# Patient Record
Sex: Female | Born: 1980
Health system: Southern US, Community
[De-identification: ages and names within clinical notes are randomized; demographics above are authoritative.]

## PROBLEM LIST (undated history)

## (undated) ENCOUNTER — Inpatient Hospital Stay (HOSPITAL_COMMUNITY): Payer: Self-pay

## (undated) DIAGNOSIS — B009 Herpesviral infection, unspecified: Secondary | ICD-10-CM

## (undated) DIAGNOSIS — I10 Essential (primary) hypertension: Secondary | ICD-10-CM

## (undated) DIAGNOSIS — J9600 Acute respiratory failure, unspecified whether with hypoxia or hypercapnia: Secondary | ICD-10-CM

## (undated) DIAGNOSIS — E119 Type 2 diabetes mellitus without complications: Secondary | ICD-10-CM

## (undated) DIAGNOSIS — E1110 Type 2 diabetes mellitus with ketoacidosis without coma: Secondary | ICD-10-CM

## (undated) DIAGNOSIS — E785 Hyperlipidemia, unspecified: Secondary | ICD-10-CM

---

## 2005-11-16 ENCOUNTER — Ambulatory Visit: Payer: Self-pay | Admitting: Internal Medicine

## 2005-11-28 ENCOUNTER — Ambulatory Visit: Payer: Self-pay | Admitting: *Deleted

## 2006-01-02 ENCOUNTER — Ambulatory Visit: Payer: Self-pay | Admitting: Internal Medicine

## 2006-01-02 ENCOUNTER — Encounter: Payer: Self-pay | Admitting: Internal Medicine

## 2006-02-06 ENCOUNTER — Ambulatory Visit: Payer: Self-pay | Admitting: Internal Medicine

## 2006-02-21 ENCOUNTER — Ambulatory Visit: Payer: Self-pay | Admitting: Internal Medicine

## 2006-03-07 ENCOUNTER — Ambulatory Visit: Payer: Self-pay | Admitting: Internal Medicine

## 2010-01-26 ENCOUNTER — Inpatient Hospital Stay (HOSPITAL_COMMUNITY): Admission: EM | Admit: 2010-01-26 | Discharge: 2010-01-29 | Payer: Self-pay | Admitting: Emergency Medicine

## 2010-02-08 ENCOUNTER — Emergency Department (HOSPITAL_COMMUNITY): Admission: EM | Admit: 2010-02-08 | Discharge: 2010-02-08 | Payer: Self-pay | Admitting: Emergency Medicine

## 2010-02-19 ENCOUNTER — Ambulatory Visit: Payer: Self-pay | Admitting: Internal Medicine

## 2010-04-21 ENCOUNTER — Ambulatory Visit: Payer: Self-pay | Admitting: Internal Medicine

## 2010-04-21 LAB — CONVERTED CEMR LAB
Albumin: 4.2 g/dL (ref 3.5–5.2)
BUN: 10 mg/dL (ref 6–23)
CO2: 19 meq/L (ref 19–32)
Calcium: 9.5 mg/dL (ref 8.4–10.5)
Chloride: 102 meq/L (ref 96–112)
Glucose, Bld: 319 mg/dL — ABNORMAL HIGH (ref 70–99)
HDL: 50 mg/dL (ref >39)
LDL Cholesterol: 102 mg/dL — ABNORMAL HIGH (ref 0–99)
Potassium: 3.9 meq/L (ref 3.5–5.3)
Sodium: 137 meq/L (ref 135–145)
TSH: 1.614 microintl units/mL (ref 0.350–4.500)
Testosterone: 79.77 ng/dL — ABNORMAL HIGH (ref 10–70)
VLDL: 14 mg/dL (ref 0–40)

## 2010-04-27 ENCOUNTER — Ambulatory Visit: Payer: Self-pay | Admitting: Internal Medicine

## 2010-05-25 ENCOUNTER — Ambulatory Visit: Payer: Self-pay | Admitting: Internal Medicine

## 2010-07-06 ENCOUNTER — Ambulatory Visit: Payer: Self-pay | Admitting: Internal Medicine

## 2010-07-20 ENCOUNTER — Observation Stay (HOSPITAL_COMMUNITY): Admission: EM | Admit: 2010-07-20 | Discharge: 2010-07-21 | Payer: Self-pay | Admitting: Emergency Medicine

## 2010-07-20 ENCOUNTER — Ambulatory Visit: Payer: Self-pay | Admitting: Cardiology

## 2010-07-21 ENCOUNTER — Encounter (INDEPENDENT_AMBULATORY_CARE_PROVIDER_SITE_OTHER): Payer: Self-pay | Admitting: Emergency Medicine

## 2010-08-03 ENCOUNTER — Ambulatory Visit: Payer: Self-pay | Admitting: Internal Medicine

## 2011-02-25 LAB — POCT CARDIAC MARKERS
CKMB, poc: <1 ng/mL — ABNORMAL LOW (ref 1.0–8.0)
Myoglobin, poc: 49.6 ng/mL (ref 12–200)
Troponin i, poc: <0.05 ng/mL (ref 0.00–0.09)

## 2011-02-25 LAB — DIFFERENTIAL
Basophils Absolute: 0 10*3/uL (ref 0.0–0.1)
Eosinophils Absolute: 0.1 10*3/uL (ref 0.0–0.7)
Eosinophils Relative: 2 % (ref 0–5)
Lymphocytes Relative: 35 % (ref 12–46)

## 2011-02-25 LAB — POCT I-STAT, CHEM 8
Glucose, Bld: 278 mg/dL — ABNORMAL HIGH (ref 70–99)
HCT: 41 % (ref 36.0–46.0)
Potassium: 3.9 mEq/L (ref 3.5–5.1)
Sodium: 136 mEq/L (ref 135–145)

## 2011-02-25 LAB — CBC
Hemoglobin: 12.7 g/dL (ref 12.0–15.0)
MCH: 28.1 pg (ref 26.0–34.0)
MCV: 85 fL (ref 78.0–100.0)
Platelets: 255 10*3/uL (ref 150–400)
RDW: 13.6 % (ref 11.5–15.5)
WBC: 6.8 10*3/uL (ref 4.0–10.5)

## 2011-02-25 LAB — HEMOGLOBIN A1C
Hgb A1c MFr Bld: 12.5 % — ABNORMAL HIGH (ref <5.7)
Mean Plasma Glucose: 312 mg/dL — ABNORMAL HIGH (ref <117)

## 2011-02-25 LAB — TROPONIN I: Troponin I: 0.03 ng/mL (ref 0.00–0.06)

## 2011-02-25 LAB — GLUCOSE, CAPILLARY
Glucose-Capillary: 197 mg/dL — ABNORMAL HIGH (ref 70–99)
Glucose-Capillary: 227 mg/dL — ABNORMAL HIGH (ref 70–99)
Glucose-Capillary: 243 mg/dL — ABNORMAL HIGH (ref 70–99)
Glucose-Capillary: 243 mg/dL — ABNORMAL HIGH (ref 70–99)

## 2011-02-25 LAB — CK TOTAL AND CKMB (NOT AT ARMC)
CK, MB: 0.8 ng/mL (ref 0.3–4.0)
Relative Index: INVALID (ref 0.0–2.5)
Relative Index: INVALID (ref 0.0–2.5)
Total CK: 72 U/L (ref 7–177)

## 2011-03-03 LAB — GLUCOSE, CAPILLARY
Glucose-Capillary: 180 mg/dL — ABNORMAL HIGH (ref 70–99)
Glucose-Capillary: 204 mg/dL — ABNORMAL HIGH (ref 70–99)
Glucose-Capillary: 219 mg/dL — ABNORMAL HIGH (ref 70–99)
Glucose-Capillary: 223 mg/dL — ABNORMAL HIGH (ref 70–99)
Glucose-Capillary: 265 mg/dL — ABNORMAL HIGH (ref 70–99)
Glucose-Capillary: 323 mg/dL — ABNORMAL HIGH (ref 70–99)

## 2011-03-03 LAB — CBC
HCT: 40.5 % (ref 36.0–46.0)
HCT: 43.8 % (ref 36.0–46.0)
MCHC: 33.2 g/dL (ref 30.0–36.0)
MCHC: 33.5 g/dL (ref 30.0–36.0)
MCHC: 33.7 g/dL (ref 30.0–36.0)
MCV: 85 fL (ref 78.0–100.0)
MCV: 86.6 fL (ref 78.0–100.0)
Platelets: 273 10*3/uL (ref 150–400)
RBC: 4.49 MIL/uL (ref 3.87–5.11)
RBC: 4.67 MIL/uL (ref 3.87–5.11)
RBC: 5.15 MIL/uL — ABNORMAL HIGH (ref 3.87–5.11)
WBC: 6.2 10*3/uL (ref 4.0–10.5)

## 2011-03-03 LAB — POCT CARDIAC MARKERS
CKMB, poc: <1 ng/mL — ABNORMAL LOW (ref 1.0–8.0)
Myoglobin, poc: 50 ng/mL (ref 12–200)

## 2011-03-03 LAB — LIPID PANEL
Cholesterol: 187 mg/dL (ref 0–200)
HDL: 46 mg/dL (ref >39)
HDL: 47 mg/dL (ref >39)
LDL Cholesterol: 125 mg/dL — ABNORMAL HIGH (ref 0–99)
Total CHOL/HDL Ratio: 4.1 RATIO
Triglycerides: 58 mg/dL (ref <150)
Triglycerides: 76 mg/dL (ref <150)
VLDL: 12 mg/dL (ref 0–40)

## 2011-03-03 LAB — DIFFERENTIAL
Basophils Absolute: 0 10*3/uL (ref 0.0–0.1)
Basophils Relative: 0 % (ref 0–1)
Basophils Relative: 1 % (ref 0–1)
Eosinophils Absolute: 0.1 10*3/uL (ref 0.0–0.7)
Eosinophils Absolute: 0.1 10*3/uL (ref 0.0–0.7)
Eosinophils Relative: 1 % (ref 0–5)
Lymphs Abs: 2.5 10*3/uL (ref 0.7–4.0)
Monocytes Absolute: 0.3 10*3/uL (ref 0.1–1.0)
Monocytes Relative: 10 % (ref 3–12)
Monocytes Relative: 6 % (ref 3–12)
Neutro Abs: 2.9 10*3/uL (ref 1.7–7.7)
Neutrophils Relative %: 47 % (ref 43–77)
Neutrophils Relative %: 61 % (ref 43–77)

## 2011-03-03 LAB — RAPID URINE DRUG SCREEN, HOSP PERFORMED
Amphetamines: NOT DETECTED
Barbiturates: NOT DETECTED
Benzodiazepines: NOT DETECTED
Opiates: NOT DETECTED

## 2011-03-03 LAB — POCT I-STAT, CHEM 8
Calcium, Ion: 1.1 mmol/L — ABNORMAL LOW (ref 1.12–1.32)
Creatinine, Ser: 1 mg/dL (ref 0.4–1.2)
HCT: 40 % (ref 36.0–46.0)
HCT: 48 % — ABNORMAL HIGH (ref 36.0–46.0)
Hemoglobin: 13.6 g/dL (ref 12.0–15.0)
Potassium: 3.7 mEq/L (ref 3.5–5.1)
Sodium: 141 mEq/L (ref 135–145)
TCO2: 23 mmol/L (ref 0–100)
TCO2: 26 mmol/L (ref 0–100)

## 2011-03-03 LAB — BASIC METABOLIC PANEL
CO2: 24 mEq/L (ref 19–32)
CO2: 25 mEq/L (ref 19–32)
Chloride: 106 mEq/L (ref 96–112)
Chloride: 107 mEq/L (ref 96–112)
Chloride: 107 mEq/L (ref 96–112)
Creatinine, Ser: 1.01 mg/dL (ref 0.4–1.2)
GFR calc Af Amer: >60 mL/min (ref >60)
GFR calc Af Amer: >60 mL/min (ref >60)
GFR calc non Af Amer: >60 mL/min (ref >60)
Glucose, Bld: 119 mg/dL — ABNORMAL HIGH (ref 70–99)
Potassium: 3 mEq/L — ABNORMAL LOW (ref 3.5–5.1)
Potassium: 4.1 mEq/L (ref 3.5–5.1)
Sodium: 139 mEq/L (ref 135–145)

## 2011-03-03 LAB — COMPREHENSIVE METABOLIC PANEL
ALT: 9 U/L (ref 0–35)
AST: 17 U/L (ref 0–37)
Calcium: 9 mg/dL (ref 8.4–10.5)
Chloride: 101 mEq/L (ref 96–112)
Creatinine, Ser: 0.9 mg/dL (ref 0.4–1.2)
Total Protein: 8.4 g/dL — ABNORMAL HIGH (ref 6.0–8.3)

## 2011-03-03 LAB — HEMOGLOBIN A1C: Hgb A1c MFr Bld: 9.9 % — ABNORMAL HIGH (ref 4.6–6.1)

## 2011-03-03 LAB — TSH
TSH: 0.764 u[IU]/mL (ref 0.350–4.500)
TSH: 1.597 u[IU]/mL (ref 0.350–4.500)

## 2011-03-03 LAB — URINALYSIS, ROUTINE W REFLEX MICROSCOPIC
Bilirubin Urine: NEGATIVE
Glucose, UA: >1000 mg/dL — AB
Ketones, ur: 40 mg/dL — AB
pH: 6.5 (ref 5.0–8.0)

## 2011-03-03 LAB — URINE MICROSCOPIC-ADD ON

## 2011-04-11 ENCOUNTER — Inpatient Hospital Stay (HOSPITAL_COMMUNITY)
Admission: EM | Admit: 2011-04-11 | Discharge: 2011-04-13 | DRG: 313 | Disposition: A | Discharge status: Home / Self Care | Payer: Self-pay | Attending: Interventional Cardiology | Admitting: Interventional Cardiology

## 2011-04-11 ENCOUNTER — Emergency Department (HOSPITAL_COMMUNITY): Payer: Self-pay

## 2011-04-11 DIAGNOSIS — Z7982 Long term (current) use of aspirin: Secondary | ICD-10-CM

## 2011-04-11 DIAGNOSIS — E669 Obesity, unspecified: Secondary | ICD-10-CM | POA: Diagnosis present

## 2011-04-11 DIAGNOSIS — R079 Chest pain, unspecified: Principal | ICD-10-CM | POA: Diagnosis present

## 2011-04-11 DIAGNOSIS — Z794 Long term (current) use of insulin: Secondary | ICD-10-CM

## 2011-04-11 DIAGNOSIS — E119 Type 2 diabetes mellitus without complications: Secondary | ICD-10-CM | POA: Diagnosis present

## 2011-04-11 DIAGNOSIS — E785 Hyperlipidemia, unspecified: Secondary | ICD-10-CM | POA: Diagnosis present

## 2011-04-11 DIAGNOSIS — I10 Essential (primary) hypertension: Secondary | ICD-10-CM | POA: Diagnosis present

## 2011-04-11 LAB — CBC
HCT: 35.1 % — ABNORMAL LOW (ref 36.0–46.0)
Hemoglobin: 11.8 g/dL — ABNORMAL LOW (ref 12.0–15.0)
MCH: 28.2 pg (ref 26.0–34.0)
MCHC: 33.6 g/dL (ref 30.0–36.0)
MCV: 83.8 fL (ref 78.0–100.0)
Platelets: 335 K/uL (ref 150–400)
RBC: 4.19 MIL/uL (ref 3.87–5.11)
RDW: 14.3 % (ref 11.5–15.5)
WBC: 7.3 10*3/uL (ref 4.0–10.5)

## 2011-04-11 LAB — CK TOTAL AND CKMB (NOT AT ARMC)
CK, MB: 1 ng/mL (ref 0.3–4.0)
CK, MB: 1 ng/mL (ref 0.3–4.0)
Relative Index: 1 (ref 0.0–2.5)
Relative Index: INVALID (ref 0.0–2.5)
Total CK: 105 U/L (ref 7–177)
Total CK: 99 U/L (ref 7–177)

## 2011-04-11 LAB — DIFFERENTIAL
Basophils Absolute: 0 K/uL (ref 0.0–0.1)
Basophils Relative: 0 % (ref 0–1)
Eosinophils Absolute: 0.1 K/uL (ref 0.0–0.7)
Eosinophils Relative: 1 % (ref 0–5)
Lymphocytes Relative: 31 % (ref 12–46)
Lymphs Abs: 2.2 10*3/uL (ref 0.7–4.0)
Monocytes Absolute: 0.4 K/uL (ref 0.1–1.0)
Monocytes Relative: 6 % (ref 3–12)
Neutro Abs: 4.5 10*3/uL (ref 1.7–7.7)
Neutrophils Relative %: 62 % (ref 43–77)

## 2011-04-11 LAB — BASIC METABOLIC PANEL WITH GFR
CO2: 22 meq/L (ref 19–32)
Calcium: 9.1 mg/dL (ref 8.4–10.5)
Chloride: 104 meq/L (ref 96–112)
Creatinine, Ser: 1.05 mg/dL (ref 0.4–1.2)
GFR calc Af Amer: >60 mL/min (ref >60)
Glucose, Bld: 121 mg/dL — ABNORMAL HIGH (ref 70–99)

## 2011-04-11 LAB — PROTIME-INR
INR: 1.07 (ref 0.00–1.49)
Prothrombin Time: 14.1 seconds (ref 11.6–15.2)

## 2011-04-11 LAB — BASIC METABOLIC PANEL
BUN: 13 mg/dL (ref 6–23)
GFR calc non Af Amer: >60 mL/min (ref >60)
Potassium: 3.7 mEq/L (ref 3.5–5.1)
Sodium: 135 mEq/L (ref 135–145)

## 2011-04-11 LAB — APTT: aPTT: 34 s (ref 24–37)

## 2011-04-11 LAB — D-DIMER, QUANTITATIVE: D-Dimer, Quant: <0.22 ug/mL-FEU (ref 0.00–0.48)

## 2011-04-11 LAB — POCT CARDIAC MARKERS
CKMB, poc: <1 ng/mL — ABNORMAL LOW (ref 1.0–8.0)
Troponin i, poc: <0.05 ng/mL (ref 0.00–0.09)

## 2011-04-11 LAB — TROPONIN I: Troponin I: 0.16 ng/mL — ABNORMAL HIGH (ref 0.00–0.06)

## 2011-04-12 ENCOUNTER — Inpatient Hospital Stay (HOSPITAL_COMMUNITY): Payer: Self-pay

## 2011-04-12 DIAGNOSIS — R079 Chest pain, unspecified: Secondary | ICD-10-CM

## 2011-04-12 LAB — HEPARIN LEVEL (UNFRACTIONATED): Heparin Unfractionated: 0.39 IU/mL (ref 0.30–0.70)

## 2011-04-12 LAB — HEMOGLOBIN A1C: Mean Plasma Glucose: 194 mg/dL — ABNORMAL HIGH (ref <117)

## 2011-04-12 LAB — GLUCOSE, CAPILLARY
Glucose-Capillary: 139 mg/dL — ABNORMAL HIGH (ref 70–99)
Glucose-Capillary: 144 mg/dL — ABNORMAL HIGH (ref 70–99)
Glucose-Capillary: 177 mg/dL — ABNORMAL HIGH (ref 70–99)

## 2011-04-12 LAB — TSH: TSH: 2.117 u[IU]/mL (ref 0.350–4.500)

## 2011-04-12 LAB — LIPID PANEL
Total CHOL/HDL Ratio: 2.9 RATIO
Triglycerides: 63 mg/dL (ref <150)
VLDL: 13 mg/dL (ref 0–40)

## 2011-04-12 LAB — CARDIAC PANEL(CRET KIN+CKTOT+MB+TROPI): Total CK: 92 U/L (ref 7–177)

## 2011-04-12 LAB — MRSA PCR SCREENING: MRSA by PCR: NEGATIVE

## 2011-04-13 ENCOUNTER — Inpatient Hospital Stay (HOSPITAL_COMMUNITY): Payer: Self-pay

## 2011-04-13 LAB — GLUCOSE, CAPILLARY
Glucose-Capillary: 137 mg/dL — ABNORMAL HIGH (ref 70–99)
Glucose-Capillary: 238 mg/dL — ABNORMAL HIGH (ref 70–99)

## 2011-04-13 LAB — CBC
MCH: 27.4 pg (ref 26.0–34.0)
MCV: 83.6 fL (ref 78.0–100.0)
Platelets: 340 10*3/uL (ref 150–400)
RBC: 4.27 MIL/uL (ref 3.87–5.11)
RDW: 14.2 % (ref 11.5–15.5)
WBC: 6.5 10*3/uL (ref 4.0–10.5)

## 2011-04-13 LAB — BASIC METABOLIC PANEL
BUN: 12 mg/dL (ref 6–23)
Calcium: 8.8 mg/dL (ref 8.4–10.5)
GFR calc non Af Amer: 60 mL/min — ABNORMAL LOW (ref >60)
Glucose, Bld: 120 mg/dL — ABNORMAL HIGH (ref 70–99)
Potassium: 3.2 mEq/L — ABNORMAL LOW (ref 3.5–5.1)
Sodium: 136 mEq/L (ref 135–145)

## 2011-04-13 MED ORDER — TECHNETIUM TC 99M TETROFOSMIN IV KIT
30.0000 | PACK | Freq: Once | INTRAVENOUS | Status: AC | PRN
  Administered 2011-04-13: 30 via INTRAVENOUS

## 2011-04-13 MED ORDER — TECHNETIUM TC 99M TETROFOSMIN IV KIT
10.0000 | PACK | Freq: Once | INTRAVENOUS | Status: AC | PRN
  Administered 2011-04-13: 10 via INTRAVENOUS

## 2011-04-26 NOTE — Discharge Summary (Signed)
  NAMEDEVINE, DANT NO.:  [id number]  MEDICAL RECORD NO.:  [id number]           PATIENT TYPE:  I  LOCATION:  3704                         FACILITY:  [facility]  PHYSICIAN:  Corky Crafts, MDDATE OF BIRTH:  1980/12/24  DATE OF ADMISSION:  04/11/2011 DATE OF DISCHARGE:  04/13/2011                              DISCHARGE SUMMARY   PRIMARY CARE PHYSICIAN:  [facility].  PRIMARY CARDIOLOGIST:  Loraine Leriche C. Anne Fu, MD  DISCHARGE DIAGNOSES: 1. Chest pain. 2. Hypertension. 3. Diabetes.  PROCEDURE PERFORMED:  Lexiscan Cardiolite stress test on May 1 and Apr 13, 2011.  It was a 2-day study showing no ischemia with an ejection fraction of 55%.  HOSPITAL COURSE:  The patient was admitted after having atypical chest pain.  She had a minimally elevated troponin with some slight ECG changes as well.  Given her young age, she underwent stress testing showing no ischemia and normal LV function.  She had no further chest pain.  Of note, she takes 4 different blood pressure medicines at home. In the hospital, she was managed on metoprolol 25 mg p.o. q.6 h.  Her blood pressure was very stable throughout her hospitalization.  We spoke about why she needed so much medicine at home, she was unsure.  I asked her to check her blood pressures at home and let us know if the blood pressures are higher than 130/85.  She has had very high blood pressures when she run out of her medicines.  She does admit that she usually only takes her labetalol once a day because she forgets the second dose.  ACTIVITY:  Increase activity slowly.  DIET:  Low-sodium heart-healthy diet.  FOLLOWUP APPOINTMENTS:  With Dr. Anne Fu in 4 weeks and with her [facility] physician in 2 weeks.  SPECIAL INSTRUCTIONS:  Check blood pressure at her mother's house where the drug store is.  If the readings are her higher than 130/85, she can call the cardiologist's office.  Of note, her amlodipine is being  held. Hopefully, if her blood pressure is well controlled, she may be able to stop other blood pressure medicines as well.  DISCHARGE MEDICATIONS: 1. Hydrochlorothiazide 25 mg daily. 2. Labetalol 200 mg b.i.d. 3. Lantus 25 units subcu daily. 4. Lipitor 40 mg daily. 5. Losartan 100 mg daily. 6. Metformin 500 mg b.i.d. 7. Sliding scale insulin. 8. Aspirin 81 mg daily.  She is to stop taking amlodipine 10 mg daily.     Corky Crafts, MD     JSV/MEDQ  D:  04/13/2011  T:  04/14/2011  Job:  [id number]  Electronically Signed by Lance Muss MD on 04/26/2011 08:10:49 AM

## 2011-06-09 NOTE — H&P (Signed)
Rebecca Rhodes, RITTHALER NO.:  [id number]  MEDICAL RECORD NO.:  [id number]           PATIENT TYPE:  O  LOCATION:  1860                         FACILITY:  [facility]  PHYSICIAN:  Therisa Doyne, MD    DATE OF BIRTH:  May 30, 1981  DATE OF ADMISSION:  04/11/2011 DATE OF DISCHARGE:                             HISTORY & PHYSICAL   This admission to the [facility].  PRIMARY CARE PROVIDER:  [facility].  PRIMARY CARDIOLOGIST:  None.  CHIEF COMPLAINT:  Chest pain.  HISTORY OF PRESENT ILLNESS:  A 30 year old African female with past medical history significant for diabetes mellitus type 2, hypertension, hyperlipidemia and obesity, who presents for evaluation of chest pain. Historically in August of 2011, she had a negative stress echo which was negative for inducible ischemia.  Her ejection fraction was preserved. This morning at 11:00 a.m. she awoke from sleep with a sharp pain in her chest, this pain was located in the retrosternal area and was nonradiating.  There were no associated signs or symptoms.  The pain lasted approximately 6 minutes.  She reports that for the next few hours she had intermittent pains off and on, however, for the past 4 hours she has been completely chest pain free.  She thought the pain initially gas, however, because she was concerned about and she came to the emergency room for evaluation.  At baseline the patient is somewhat sedentary, however, she does care for her mother who has history of stroke.  She denies any exertional angina or heart failure symptoms.  She denies any recent illness.  She denies any DVT risk factors or IV drug use.  PAST MEDICAL HISTORY: 1. Diabetes mellitus. 2. Hypertension. 3. Hyperlipidemia. 4. Obesity. 5. History of negative stress echocardiogram in August 2011.  SOCIAL HISTORY:  The patient lives in [place].  She denies any tobacco, alcohol or drug use.  She cares for her mother who has  a history of stroke.  FAMILY HISTORY:  Notable for father who died in his 48s from myocardial infarction.  ALLERGIES:  NO KNOWN DRUG ALLERGIES.  MEDICATIONS: 1. Metformin 500 mg b.i.d. 2. Labetalol 200 mg b.i.d. 3. Lipitor 20 mg daily. 4. Lantus 25 units nightly. 5. Novolin sliding scale insulin.  REVIEW OF SYSTEMS:  All systems were reviewed and were negative except for what is mentioned above in the history of present illness.  PHYSICAL EXAMINATION:  VITAL SIGNS:  Temperature afebrile, blood pressure is 130/80, pulse is 80, respirations 16, oxygen saturation 99% room air. GENERAL:  Obese female in no acute distress. HEENT:  Normocephalic atraumatic.  Pupils are equal, round and reactive to light and accommodation.  Oropharynx is pink and moist without lesions. NECK:  Supple.  No lymphadenopathy.  No jugular venous distention.  No masses. CARDIOVASCULAR:  Regular rate and rhythm with distant heart sounds. Normal S1-S2.  No murmurs, rubs or gallops. CHEST:  Clear to auscultation bilaterally. ABDOMEN:  Positive bowel sounds, soft, nontender and nondistended. EXTREMITIES:  Trace lower extremity edema bilaterally. SKIN:  No rashes. BACK:  No CVA tenderness.  Dorsalis his pulse 2+ bilaterally. NEUROLOGIC:  No  focal deficits. PSYCH:  Normal affect.  PERTINENT LAB DATA:  Shows a hemoglobin 11.8, platelets 338.  Potassium 3.7, BUN 13, creatinine 1.05.  Initial troponin was negative at less than 0.05 with subsequent troponins rising from 0.14 and subsequent to 0.16, CK-MB was negative.  Chest x-ray shows no acute cardiopulmonary disease.  EKG #1 from 1603 demonstrates sinus rhythm at a rate of 82 beats per minute, normal axis, normal intervals.  There is a just under 1 mm of ST- segment elevation in lead I and AVL, this is more consistent with an early repolarization as opposed to a true injury current while there is mild ST depression in lead III.  There are no other signs of  reciprocal changes.  When compared to her previous EKG the contours of the ST segments in I and AVL are similar, however, on today's EKG, there is a mild increase in ST-segment elevation.  EKG #2 from 2242 demonstrates sinus rhythm at 78 beats per minute, left ventricular hypertrophy, and again less than 1 mm of ST-segment elevation in the high lateral leads with no reciprocal changes.  This EKG is unchanged when compared to the EKG at 1693.  IMPRESSION AND PLAN:  Ms. Rebecca Rhodes is a 30 year old African female with past medical history significant for diabetes, hypertension, hyperlipidemia, obesity and her family history of premature coronary artery disease, who presents with chest pain approximately 12 hours again and now has a minimally elevated troponin of 0.16.  1. We will admit the patient to [facility].  She currently does     not have an assigned cardiologist.  2. Acute myocardial infarction.  While her history and presentation is     somewhat atypical for an acute coronary syndrome, she does have     elevated troponins in the setting of chest pain.  Her EKG does show     minimal ST-segment elevation, however, it is not definitively     greater than 1 mm in the high lateral leads.  The contour of her ST-     segments are more consistent with repolarization abnormalities and J-     point elevation as opposed to a true injury current; even if this     were an injury current, her symptoms occurred greater than 12 hours     ago and she is chest pain free, therefore, emergent PCI at this     time is not indicated.  In the interim time, we will treat her     aggressively as she does meet the definition for an acute     myocardial infarction.  We will place her on aspirin 325 mg daily,     load her with Plavix 600 mg and start her on Plavix 75 mg daily.     We will start her on heparin drip for systemic anticoagulation.  We     will start her on beta-blocker, ACE inhibitor and  statin therapy.     PE is certainly in the differential and could account for a similar     presentation.  We will check a D-dimer.  If this is elevated, she     will need to undergo a CTA.  If her D-dimer is negative, then we     will check lower extremity Dopplers.  In the morning, we will     obtain an echocardiogram.  Depending on the results of her     echocardiogram and her cardiac enzymes, she may also need a cardiac  catheterization.  We will continue to cycle her cardiac enzymes.     Currently, she is chest pain-free, which is a reassuring sign. 3. Diabetes mellitus, decrease the patient's Lantus to 15 units.     Placed her on sliding scale insulin.  Check her hemoglobin A1c. 4. Hyperlipidemia.  Continue statin therapy.  Check fasting profile. 5. Fluids, electrolytes, and nutrition.  Saline lock IV fluids,     electrolytes are stable, n.p.o. 6. DVT prophylaxis, not indicated as the patient is on systemic     anticoagulation with heparin drip.     Therisa Doyne, MD     SJT/MEDQ  D:  04/11/2011  T:  04/12/2011  Job:  [id number]  Electronically Signed by Aldona Bar MD on 06/09/2011 10:07:19 PM

## 2011-08-25 ENCOUNTER — Emergency Department (HOSPITAL_COMMUNITY): Payer: Self-pay

## 2011-08-25 ENCOUNTER — Emergency Department (HOSPITAL_COMMUNITY)
Admission: EM | Admit: 2011-08-25 | Discharge: 2011-08-26 | Disposition: A | Discharge status: Home / Self Care | Payer: Self-pay | Attending: Emergency Medicine | Admitting: Emergency Medicine

## 2011-08-25 DIAGNOSIS — E119 Type 2 diabetes mellitus without complications: Secondary | ICD-10-CM | POA: Insufficient documentation

## 2011-08-25 DIAGNOSIS — R358 Other polyuria: Secondary | ICD-10-CM | POA: Insufficient documentation

## 2011-08-25 DIAGNOSIS — I10 Essential (primary) hypertension: Secondary | ICD-10-CM | POA: Insufficient documentation

## 2011-08-25 DIAGNOSIS — Z79899 Other long term (current) drug therapy: Secondary | ICD-10-CM | POA: Insufficient documentation

## 2011-08-25 DIAGNOSIS — R3589 Other polyuria: Secondary | ICD-10-CM | POA: Insufficient documentation

## 2011-08-25 DIAGNOSIS — Z794 Long term (current) use of insulin: Secondary | ICD-10-CM | POA: Insufficient documentation

## 2011-08-25 DIAGNOSIS — R631 Polydipsia: Secondary | ICD-10-CM | POA: Insufficient documentation

## 2011-08-25 DIAGNOSIS — E7800 Pure hypercholesterolemia, unspecified: Secondary | ICD-10-CM | POA: Insufficient documentation

## 2011-08-25 DIAGNOSIS — Z7982 Long term (current) use of aspirin: Secondary | ICD-10-CM | POA: Insufficient documentation

## 2011-08-25 LAB — CBC
MCHC: 33.5 g/dL (ref 30.0–36.0)
Platelets: 307 10*3/uL (ref 150–400)
RDW: 14.1 % (ref 11.5–15.5)
WBC: 8.3 10*3/uL (ref 4.0–10.5)

## 2011-08-25 LAB — URINE MICROSCOPIC-ADD ON

## 2011-08-25 LAB — DIFFERENTIAL
Basophils Absolute: 0 10*3/uL (ref 0.0–0.1)
Basophils Relative: 0 % (ref 0–1)
Eosinophils Relative: 3 % (ref 0–5)
Lymphocytes Relative: 30 % (ref 12–46)
Monocytes Absolute: 0.4 10*3/uL (ref 0.1–1.0)

## 2011-08-25 LAB — COMPREHENSIVE METABOLIC PANEL
ALT: 5 U/L (ref 0–35)
AST: 12 U/L (ref 0–37)
Albumin: 3.8 g/dL (ref 3.5–5.2)
Alkaline Phosphatase: 95 U/L (ref 39–117)
Chloride: 95 mEq/L — ABNORMAL LOW (ref 96–112)
Potassium: 3.6 mEq/L (ref 3.5–5.1)
Sodium: 130 mEq/L — ABNORMAL LOW (ref 135–145)
Total Bilirubin: 0.2 mg/dL — ABNORMAL LOW (ref 0.3–1.2)
Total Protein: 8.1 g/dL (ref 6.0–8.3)

## 2011-08-25 LAB — URINALYSIS, ROUTINE W REFLEX MICROSCOPIC
Bilirubin Urine: NEGATIVE
Glucose, UA: >1000 mg/dL — AB
Hgb urine dipstick: NEGATIVE
Nitrite: NEGATIVE
Specific Gravity, Urine: 1.015 (ref 1.005–1.030)
pH: 5 (ref 5.0–8.0)

## 2011-08-25 LAB — POCT PREGNANCY, URINE: Preg Test, Ur: NEGATIVE

## 2011-08-25 LAB — POCT I-STAT TROPONIN I: Troponin i, poc: 0 ng/mL (ref 0.00–0.08)

## 2011-08-26 LAB — GLUCOSE, CAPILLARY

## 2012-06-04 ENCOUNTER — Encounter (HOSPITAL_COMMUNITY): Payer: Self-pay | Admitting: *Deleted

## 2012-06-04 ENCOUNTER — Inpatient Hospital Stay (HOSPITAL_COMMUNITY)
Admission: EM | Admit: 2012-06-04 | Discharge: 2012-06-08 | DRG: 638 | Disposition: A | Discharge status: Home / Self Care | Payer: Self-pay | Source: Ambulatory Visit | Attending: Internal Medicine | Admitting: Internal Medicine

## 2012-06-04 ENCOUNTER — Emergency Department (HOSPITAL_COMMUNITY): Payer: Self-pay

## 2012-06-04 DIAGNOSIS — R000 Tachycardia, unspecified: Secondary | ICD-10-CM | POA: Diagnosis present

## 2012-06-04 DIAGNOSIS — Z794 Long term (current) use of insulin: Secondary | ICD-10-CM

## 2012-06-04 DIAGNOSIS — E118 Type 2 diabetes mellitus with unspecified complications: Secondary | ICD-10-CM | POA: Diagnosis present

## 2012-06-04 DIAGNOSIS — E1310 Other specified diabetes mellitus with ketoacidosis without coma: Principal | ICD-10-CM | POA: Diagnosis present

## 2012-06-04 DIAGNOSIS — E1110 Type 2 diabetes mellitus with ketoacidosis without coma: Secondary | ICD-10-CM | POA: Diagnosis present

## 2012-06-04 DIAGNOSIS — E1010 Type 1 diabetes mellitus with ketoacidosis without coma: Secondary | ICD-10-CM

## 2012-06-04 DIAGNOSIS — I10 Essential (primary) hypertension: Secondary | ICD-10-CM

## 2012-06-04 DIAGNOSIS — R109 Unspecified abdominal pain: Secondary | ICD-10-CM | POA: Diagnosis not present

## 2012-06-04 DIAGNOSIS — IMO0002 Reserved for concepts with insufficient information to code with codable children: Secondary | ICD-10-CM | POA: Diagnosis present

## 2012-06-04 DIAGNOSIS — E876 Hypokalemia: Secondary | ICD-10-CM | POA: Diagnosis not present

## 2012-06-04 DIAGNOSIS — N390 Urinary tract infection, site not specified: Secondary | ICD-10-CM | POA: Diagnosis present

## 2012-06-04 DIAGNOSIS — E119 Type 2 diabetes mellitus without complications: Secondary | ICD-10-CM

## 2012-06-04 DIAGNOSIS — E860 Dehydration: Secondary | ICD-10-CM | POA: Diagnosis present

## 2012-06-04 DIAGNOSIS — L0231 Cutaneous abscess of buttock: Secondary | ICD-10-CM | POA: Diagnosis present

## 2012-06-04 DIAGNOSIS — L03317 Cellulitis of buttock: Secondary | ICD-10-CM | POA: Diagnosis present

## 2012-06-04 DIAGNOSIS — Z9119 Patient's noncompliance with other medical treatment and regimen: Secondary | ICD-10-CM

## 2012-06-04 DIAGNOSIS — D7289 Other specified disorders of white blood cells: Secondary | ICD-10-CM

## 2012-06-04 DIAGNOSIS — Z91199 Patient's noncompliance with other medical treatment and regimen due to unspecified reason: Secondary | ICD-10-CM

## 2012-06-04 DIAGNOSIS — D72829 Elevated white blood cell count, unspecified: Secondary | ICD-10-CM | POA: Diagnosis present

## 2012-06-04 DIAGNOSIS — E782 Mixed hyperlipidemia: Secondary | ICD-10-CM

## 2012-06-04 DIAGNOSIS — E785 Hyperlipidemia, unspecified: Secondary | ICD-10-CM | POA: Diagnosis present

## 2012-06-04 HISTORY — DX: Hyperlipidemia, unspecified: E78.5

## 2012-06-04 HISTORY — DX: Type 2 diabetes mellitus without complications: E11.9

## 2012-06-04 HISTORY — DX: Essential (primary) hypertension: I10

## 2012-06-04 LAB — DIFFERENTIAL
Basophils Relative: 0 % (ref 0–1)
Eosinophils Absolute: 0.2 10*3/uL (ref 0.0–0.7)
Eosinophils Relative: 1 % (ref 0–5)
Monocytes Relative: 7 % (ref 3–12)
Neutrophils Relative %: 75 % (ref 43–77)

## 2012-06-04 LAB — BASIC METABOLIC PANEL
BUN: 19 mg/dL (ref 6–23)
CO2: 10 mEq/L — CL (ref 19–32)
CO2: 11 mEq/L — ABNORMAL LOW (ref 19–32)
Calcium: 9.8 mg/dL (ref 8.4–10.5)
Calcium: 9 mg/dL (ref 8.4–10.5)
Chloride: 106 mEq/L (ref 96–112)
Chloride: 107 mEq/L (ref 96–112)
GFR calc Af Amer: 73 mL/min — ABNORMAL LOW (ref >90)
GFR calc Af Amer: 68 mL/min — ABNORMAL LOW (ref >90)
GFR calc non Af Amer: 56 mL/min — ABNORMAL LOW (ref >90)
GFR calc non Af Amer: 63 mL/min — ABNORMAL LOW (ref >90)
GFR calc non Af Amer: 64 mL/min — ABNORMAL LOW (ref >90)
Glucose, Bld: 626 mg/dL (ref 70–99)
Glucose, Bld: 236 mg/dL — ABNORMAL HIGH (ref 70–99)
Potassium: 4 mEq/L (ref 3.5–5.1)
Potassium: 3.5 mEq/L (ref 3.5–5.1)
Sodium: 140 mEq/L (ref 135–145)
Sodium: 140 mEq/L (ref 135–145)

## 2012-06-04 LAB — URINE MICROSCOPIC-ADD ON

## 2012-06-04 LAB — GLUCOSE, CAPILLARY
Glucose-Capillary: 591 mg/dL (ref 70–99)
Glucose-Capillary: 537 mg/dL — ABNORMAL HIGH (ref 70–99)
Glucose-Capillary: 457 mg/dL — ABNORMAL HIGH (ref 70–99)
Glucose-Capillary: 207 mg/dL — ABNORMAL HIGH (ref 70–99)

## 2012-06-04 LAB — CBC
HCT: 41.7 % (ref 36.0–46.0)
Hemoglobin: 14.6 g/dL (ref 12.0–15.0)
MCH: 28.7 pg (ref 26.0–34.0)
MCHC: 32.6 g/dL (ref 30.0–36.0)
MCHC: 35 g/dL (ref 30.0–36.0)
Platelets: 322 10*3/uL (ref 150–400)
RBC: 4.94 MIL/uL (ref 3.87–5.11)

## 2012-06-04 LAB — URINALYSIS, ROUTINE W REFLEX MICROSCOPIC
Hgb urine dipstick: NEGATIVE
Ketones, ur: >80 mg/dL — AB
Protein, ur: 30 mg/dL — AB
Urobilinogen, UA: 0.2 mg/dL (ref 0.0–1.0)

## 2012-06-04 LAB — CARDIAC PANEL(CRET KIN+CKTOT+MB+TROPI)
CK, MB: 1.4 ng/mL (ref 0.3–4.0)
Relative Index: INVALID (ref 0.0–2.5)
Total CK: 56 U/L (ref 7–177)
Troponin I: <0.3 ng/mL (ref <0.30)

## 2012-06-04 MED ORDER — LABETALOL HCL 5 MG/ML IV SOLN
5.0000 mg | Freq: Once | INTRAVENOUS | Status: AC
  Administered 2012-06-04: 5 mg via INTRAVENOUS
  Filled 2012-06-04: qty 4

## 2012-06-04 MED ORDER — SODIUM CHLORIDE 0.9 % IV SOLN
INTRAVENOUS | Status: DC
  Administered 2012-06-04: 4 [IU]/h via INTRAVENOUS
  Filled 2012-06-04: qty 1

## 2012-06-04 MED ORDER — DEXTROSE 50 % IV SOLN: 25.0000 mL | INTRAVENOUS | Status: DC | PRN

## 2012-06-04 MED ORDER — POTASSIUM CHLORIDE IN NACL 20-0.9 MEQ/L-% IV SOLN
Freq: Once | INTRAVENOUS | Status: AC
  Administered 2012-06-04: 14:00:00 via INTRAVENOUS
  Filled 2012-06-04: qty 1000

## 2012-06-04 MED ORDER — INSULIN ASPART 100 UNIT/ML ~~LOC~~ SOLN
15.0000 [IU] | Freq: Once | SUBCUTANEOUS | Status: AC
  Administered 2012-06-04: 15 [IU] via SUBCUTANEOUS
  Filled 2012-06-04: qty 1

## 2012-06-04 MED ORDER — ATORVASTATIN CALCIUM 20 MG PO TABS
20.0000 mg | ORAL_TABLET | Freq: Every day | ORAL | Status: DC
  Administered 2012-06-04 – 2012-06-07 (×4): 20 mg via ORAL
  Filled 2012-06-04 (×5): qty 1

## 2012-06-04 MED ORDER — INSULIN REGULAR HUMAN 100 UNIT/ML IJ SOLN
15.0000 [IU] | Freq: Once | INTRAMUSCULAR | Status: DC
  Filled 2012-06-04: qty 0.15

## 2012-06-04 MED ORDER — SODIUM CHLORIDE 0.9 % IV BOLUS (SEPSIS)
1000.0000 mL | Freq: Once | INTRAVENOUS | Status: AC
  Administered 2012-06-04: 1000 mL via INTRAVENOUS

## 2012-06-04 MED ORDER — LABETALOL HCL 200 MG PO TABS
200.0000 mg | ORAL_TABLET | Freq: Two times a day (BID) | ORAL | Status: DC
  Administered 2012-06-04 – 2012-06-08 (×8): 200 mg via ORAL
  Filled 2012-06-04 (×9): qty 1

## 2012-06-04 MED ORDER — POTASSIUM CHLORIDE 10 MEQ/100ML IV SOLN
10.0000 meq | INTRAVENOUS | Status: AC
  Administered 2012-06-04 (×4): 10 meq via INTRAVENOUS
  Filled 2012-06-04 (×4): qty 100

## 2012-06-04 MED ORDER — INSULIN REGULAR HUMAN 100 UNIT/ML IJ SOLN: 15.0000 [IU] | Freq: Once | INTRAMUSCULAR | Status: DC

## 2012-06-04 MED ORDER — LABETALOL HCL 5 MG/ML IV SOLN: 5.0000 mg | Freq: Once | INTRAVENOUS | Status: DC

## 2012-06-04 MED ORDER — INSULIN REGULAR HUMAN 100 UNIT/ML IJ SOLN
10.0000 [IU] | Freq: Once | INTRAMUSCULAR | Status: DC
  Filled 2012-06-04: qty 0.1

## 2012-06-04 MED ORDER — SODIUM CHLORIDE 0.9 % IV SOLN: INTRAVENOUS | Status: DC

## 2012-06-04 MED ORDER — DEXTROSE-NACL 5-0.45 % IV SOLN
INTRAVENOUS | Status: DC
  Administered 2012-06-04: 19:00:00 via INTRAVENOUS

## 2012-06-04 MED ORDER — SODIUM CHLORIDE 0.9 % IV SOLN: INTRAVENOUS | Status: AC

## 2012-06-04 MED ORDER — GI COCKTAIL ~~LOC~~
30.0000 mL | Freq: Three times a day (TID) | ORAL | Status: DC | PRN
  Filled 2012-06-04: qty 30

## 2012-06-04 MED ORDER — PANTOPRAZOLE SODIUM 40 MG PO TBEC
40.0000 mg | DELAYED_RELEASE_TABLET | Freq: Every day | ORAL | Status: DC
  Administered 2012-06-05 – 2012-06-06 (×2): 40 mg via ORAL
  Filled 2012-06-04 (×2): qty 1

## 2012-06-04 MED ORDER — SODIUM CHLORIDE 0.9 % IV SOLN
INTRAVENOUS | Status: DC
  Administered 2012-06-05: 10.2 [IU]/h via INTRAVENOUS
  Filled 2012-06-04: qty 1

## 2012-06-04 MED ORDER — INSULIN ASPART 100 UNIT/ML ~~LOC~~ SOLN
0.0000 [IU] | Freq: Three times a day (TID) | SUBCUTANEOUS | Status: DC
  Administered 2012-06-05 (×2): 2 [IU] via SUBCUTANEOUS

## 2012-06-04 MED ORDER — ENOXAPARIN SODIUM 40 MG/0.4ML ~~LOC~~ SOLN
40.0000 mg | SUBCUTANEOUS | Status: DC
  Administered 2012-06-04 – 2012-06-05 (×2): 40 mg via SUBCUTANEOUS
  Filled 2012-06-04 (×3): qty 0.4

## 2012-06-04 NOTE — ED Notes (Signed)
Pt. To x-ray .

## 2012-06-04 NOTE — ED Notes (Signed)
Pt. States "my blood sugar is high".  States that she is thirsty all the time. Also c/o abscess in "private region". Stated that "it was big but has drained".

## 2012-06-04 NOTE — ED Provider Notes (Signed)
History     CSN: [id number]  Arrival date & time 06/04/12  1012   First MD Initiated Contact with Patient 06/04/12 1124      Chief Complaint  Patient presents with  . Weakness  . Abscess    (Consider location/radiation/quality/duration/timing/severity/associated sxs/prior treatment) HPI Comments: Patient with a history of Type II DM comes in today with a chief complaint of generalized weakness, fatigue, and SOB.  She also reports that she is feeling dehydrated.  She has been off of her Insulin for the past 2 months.  She was unable to afford her medication.  She has been on Insulin for 2 years.  She does not check her blood sugars at home.  She denies any nausea, vomiting, or abdominal pain. No confusion.  No headache.  No chest pain.  No fever or chills.  She also had an abscess of her buttocks a few days ago that she reports has drained and resolved.  The history is provided by the patient.    Past Medical History  Diagnosis Date  . Diabetes mellitus   . Hypertension     History reviewed. No pertinent past surgical history.  History reviewed. No pertinent family history.  History  Substance Use Topics  . Smoking status: Never Smoker   . Smokeless tobacco: Not on file  . Alcohol Use: No    OB History    Grav Para Term Preterm Abortions TAB SAB Ect Mult Living                  Review of Systems  Constitutional: Positive for fatigue. Negative for fever, chills, diaphoresis and appetite change.  Eyes: Negative for visual disturbance.  Respiratory: Positive for shortness of breath. Negative for cough and wheezing.   Cardiovascular: Negative for chest pain.  Gastrointestinal: Negative for nausea, vomiting, abdominal pain and diarrhea.  Genitourinary: Negative for dysuria, urgency, frequency and hematuria.  Skin: Negative for rash.       No abscess  Neurological: Negative for dizziness, tremors, syncope, light-headedness, numbness and headaches.    Psychiatric/Behavioral: Negative for confusion.    Allergies  Review of patient's allergies indicates no known allergies.  Home Medications   Current Outpatient Rx  Name Route Sig Dispense Refill  . INSULIN GLARGINE 100 UNIT/ML [place] SOLN Subcutaneous Inject 25 Units into the skin 2 (two) times daily.    . INSULIN REGULAR HUMAN 100 UNIT/ML IJ SOLN Subcutaneous Inject 10-25 Units into the skin 3 (three) times daily before meals. Sliding scale      BP 148/104  Pulse 120  Temp 98.6 F (37 C) (Oral)  Resp 28  SpO2 100%  LMP 05/11/2012  Physical Exam  Nursing note and vitals reviewed. Constitutional: She is oriented to person, place, and time. She appears well-developed and well-nourished. No distress.  HENT:  Head: Normocephalic and atraumatic.       Dry mucous membranes  Eyes: EOM are normal. Pupils are equal, round, and reactive to light.  Neck: Normal range of motion. Neck supple.  Cardiovascular: Normal rate, regular rhythm and normal heart sounds.   Pulmonary/Chest: No accessory muscle usage. Tachypnea noted. She has no wheezes. She has no rales. She exhibits no tenderness.  Abdominal: Soft. Bowel sounds are normal. She exhibits no distension and no mass. There is no tenderness. There is no rebound and no guarding.  Musculoskeletal: Normal range of motion.  Neurological: She is alert and oriented to person, place, and time.  Skin: Skin is warm and dry. No  rash noted. She is not diaphoretic. No erythema.       No abscess visualized.  Psychiatric: She has a normal mood and affect.    ED Course  Procedures (including critical care time)  Labs Reviewed  GLUCOSE, CAPILLARY - Abnormal; Notable for the following:    Glucose-Capillary 591 (*)     All other components within normal limits  GLUCOSE, CAPILLARY - Abnormal; Notable for the following:    Glucose-Capillary 545 (*)     All other components within normal limits   Dg Chest 2 View  06/04/2012  *RADIOLOGY REPORT*   Clinical Data: Shortness of breath.  CHEST - 2 VIEW  Comparison: 08/25/2011.  Findings: Trachea is midline.  Heart size normal.  Lungs are clear. No pleural fluid.  IMPRESSION: No acute findings.  Original Report Authenticated By: Reyes Ivan, M.D.     No diagnosis found.  Discussed with Dr. Janee Morn with [facility]. He reports that he will admit patient.  He is requesting that I order a procalcitonin and lactic acid due to the patients elevated WBC.  He is also requesting a EKG and d-dimer due to the patients shortness of breath.  MDM  Patient presenting with fatigue, SOB, and generalized weakness.  Patient presented with a blood sugar of 626 upon arrival in the ED.  BMP shows elevated anion gap of greater than 30.  She has not been taking her insulin for the past 2 months for financial reasons.  Patient is also feeling SOB and has tachypnea.  Feel that this is most likely related to DKA.   Patient started on glucostabilizer in the ED and given 2 L IVF.  Patient admitted to hospitalist service for further management.          Pascal Lux [place], PA-C 06/04/12 1905

## 2012-06-04 NOTE — ED Notes (Addendum)
Blood gas, venous not drawn. Error in charting. Discontinued per EDP.

## 2012-06-04 NOTE — ED Notes (Signed)
To ED for eval of possible abscess on right buttocks. Pt states it 'busted' but it didn't drain out. Pt states she has not been able to check her sugar due to her machine breaking, but is taking insulin. Pt appears very weak. Poor hygiene noted.

## 2012-06-04 NOTE — H&P (Signed)
Rebecca Rhodes MRN: [id number] DOB/AGE: 1981/06/19 31 y.o. Primary Care Physician:WILSON,AMELIA, MD Admit date: 06/04/2012 Chief Complaint: High sugars/shortness of breath. HPI:  Rebecca Rhodes is a 31 year old African American female with a past medical history of hypertension, diabetes mellitus insulin-dependent, hyperlipidemia who presents to the ED with a three-day history of some shortness of breath, polydipsia, polyuria, seeing spots in her right eye, generalized weakness and fatigue. Patient also does endorse some heartburn. Patient denies any fever, no chills, no chest pain, no nausea, no vomiting, no diarrhea, no constipation, no dysuria, no cough. Patient states that she's run out of all her medications over the past 2 months did not have the money to pay her co-pay in order to see her PCP to get refills on the medications. Patient presented to the ED was noted to have blood sugars greater than 600. Be met which was done did show a sodium of 1:30 potassium of 4.1 chloride of 94 and a bicarbonate of less than 7 a BUN of 19 a creatinine of 1.25. Patient was placed on the insulin drip and she was given a liter of IV fluids. Will call to admit the patient for further evaluation and management.   Past Medical History  Diagnosis Date  . Diabetes mellitus   . Hypertension   . HTN (hypertension) 06/04/2012  . Hyperlipidemia 06/04/2012  . Diabetes mellitus 06/04/2012    History reviewed. No pertinent past surgical history.  Prior to Admission medications   Medication Sig Start Date End Date Taking? Authorizing Provider  insulin glargine (LANTUS) 100 UNIT/ML injection Inject 25 Units into the skin 2 (two) times daily.   Yes Historical Provider, MD  insulin regular (NOVOLIN R,HUMULIN R) 100 units/mL injection Inject 10-25 Units into the skin 3 (three) times daily before meals. Sliding scale   Yes Historical Provider, MD    Allergies: No Known Allergies  History reviewed. No pertinent family  history.  Social History:  reports that she has never smoked. She does not have any smokeless tobacco history on file. She reports that she does not drink alcohol or use illicit drugs.  ROS: All systems reviewed with the patient and was positive as per HPI otherwise all other systems are negative.  PHYSICAL EXAM: Blood pressure 143/96, pulse 110, temperature 98.5 F (36.9 C), temperature source Oral, resp. rate 31, last menstrual period 05/11/2012, SpO2 99.00%. General: Well-developed well-nourished in no acute cardiopulmonary distress. Speaking in full sentences.  HEENT: Normocephalic atraumatic. Pupils equal round reactive to light and accommodation. Extraocular movements intact. Oropharynx is clear, no lesions, no exudates. Extremely dry mucous membranes. Neck is supple with no lymphadenopathy.  Heart: Tachycardic, without murmurs, rubs, gallops. Lungs: Clear to auscultation bilaterally. Abdomen: Soft, nontender, nondistended, positive bowel sounds. Extremities: No clubbing cyanosis or edema with positive pedal pulses. Neuro: Alert and oriented x3. Cranial nerves II through XII are grossly intact. No focal deficits.     EKG: T-wave inversion in lead 3 which is seen on prior EKG. Slight ST depression V5 through V6 and leads 2. No significant change from prior prior EKG  No results found for this or any previous visit (from the past 240 hour(s)).   Lab results:  Basename 06/04/12 1146  NA 130*  K 4.1  CL 94*  CO2 <7*  GLUCOSE 626*  BUN 19  CREATININE 1.25*  CALCIUM 9.8  MG --  PHOS --   No results found for this basename: AST:2,ALT:2,ALKPHOS:2,BILITOT:2,PROT:2,ALBUMIN:2 in the last 72 hours No results found for this basename: LIPASE:2,AMYLASE:2  in the last 72 hours  Basename 06/04/12 1146  WBC 14.4*  NEUTROABS 10.8*  HGB 15.3*  HCT 46.9*  MCV 88.0  PLT 322   No results found for this basename: CKTOTAL:3,CKMB:3,CKMBINDEX:3,TROPONINI:3 in the last 72 hours No  components found with this basename: POCBNP:3 No results found for this basename: DDIMER in the last 72 hours No results found for this basename: HGBA1C:2 in the last 72 hours No results found for this basename: CHOL:2,HDL:2,LDLCALC:2,TRIG:2,CHOLHDL:2,LDLDIRECT:2 in the last 72 hours No results found for this basename: TSH,T4TOTAL,FREET3,T3FREE,THYROIDAB in the last 72 hours No results found for this basename: VITAMINB12:2,FOLATE:2,FERRITIN:2,TIBC:2,IRON:2,RETICCTPCT:2 in the last 72 hours Imaging results:  Dg Chest 2 View  06/04/2012  *RADIOLOGY REPORT*  Clinical Data: Shortness of breath.  CHEST - 2 VIEW  Comparison: 08/25/2011.  Findings: Trachea is midline.  Heart size normal.  Lungs are clear. No pleural fluid.  IMPRESSION: No acute findings.  Original Report Authenticated By: Reyes Ivan, M.D.   Impression/Plan:  Principal Problem:  *DKA (diabetic ketoacidoses) Active Problems:  Leukocytosis  Dehydration  HTN (hypertension)  Hyperlipidemia  Diabetes mellitus   #1. Diabetic ketoacidosis Questionable etiology. Likely secondary to medical noncompliance as patient has been out of her medications over the past couple of months secondary to financial issues. On CBC drawn patient however does have a leukocytosis and had some complaints of shortness of breath. Chest x-ray which was done was negative for any infiltrate. Patient does have anion gap of 30 or greater. Will cycle cardiac enzymes every 8 hours x3. Will check a d-dimer. Will check a UA with cultures and sensitivities. Will check blood cultures x2. Check a lactic acid and a procalcitonin level. We'll place on the glucose stabilizer. Hydrate with IV fluids. And monitor. Will check a hemoglobin A1c. And we'll consult with a diabetic coordinator. Will follow for now. Follow.  #2 leukocytosis Questionable etiology. Chest x-ray is negative for infiltrate. Will repeat chest x-ray in the morning of the patient was hydrated to see with  a pneumonia fossa. Check Urinalysis with cultures and sensitivities. Will check blood cultures x2. Check a lactic acid a pro calcitonin level. We'll monitor for now. No need for antibiotics at this time.  #3 hypertension Secondary to medical noncompliance secondary to financial constraints. Per last discharge summary from last year patient was on 100 mg olmesartan daily and 200 mg of labetalol twice daily and HCTZ 25 mg daily. Will resume patient back on labetalol 200 mg twice daily and follow. If more blood pressure management is needed consider adding losartan at 100 mg daily.  #4 hyperlipidemia Will check a fasting lipid panel. Per last discharge summary patient was mostly on 20 mg of Lipitor daily. Will start patient back on Lipitor 20 mg daily.  #5 dehydration Likely secondary to problem #1. Will hydrate with IV fluids.  #6 diabetes mellitus Will check a hemoglobin A1c. See problem #1. Once patient is a 9 Has been closed we will place patient on Lantus at approximately 20-25 units daily. And will start her on a sliding scale insulin. Consult with diabetes coordinator.  #7 prophylaxis PPI for GI prophylaxis. Lovenox for DVT prophylaxis.  #8 financial constraints We'll consult with case manager.   Rebecca Rhodes [phone] 06/04/2012, 4:01 PM

## 2012-06-04 NOTE — ED Provider Notes (Signed)
Medical screening examination/treatment/procedure(s) were conducted as a shared visit with non-physician practitioner(s) and myself.  I personally evaluated the patient during the encounter.  31 year old female with generalized fatigue. Unfortunately patient has not been taking her insulin for approximately 2 months because of financial constraints. On exam she appears tired and has kussmaul respirations. Laboratory testing is consistent with diabetic ketoacidosis. Afebrile. Small indurated lesion to buttock but no drainable collection. Doubt precipitating event. Plan IVF, insulin gtt, potassium repletion. Will require admit for ongoing tx and evaluation.  CRITICAL CARE Performed by: Raeford Razor   Total critical care time: 35 minutes  Critical care time was exclusive of separately billable procedures and treating other patients.  Critical care was necessary to treat or prevent imminent or life-threatening deterioration.  Critical care was time spent personally by me on the following activities: development of treatment plan with patient and/or surrogate as well as nursing, discussions with consultants, evaluation of patient's response to treatment, examination of patient, obtaining history from patient or surrogate, ordering and performing treatments and interventions, ordering and review of laboratory studies, ordering and review of radiographic studies, pulse oximetry and re-evaluation of patient's condition.  Raeford Razor, MD 06/05/12 [phone]

## 2012-06-05 ENCOUNTER — Inpatient Hospital Stay (HOSPITAL_COMMUNITY): Payer: Self-pay

## 2012-06-05 DIAGNOSIS — E876 Hypokalemia: Secondary | ICD-10-CM | POA: Diagnosis not present

## 2012-06-05 DIAGNOSIS — E782 Mixed hyperlipidemia: Secondary | ICD-10-CM

## 2012-06-05 DIAGNOSIS — E1010 Type 1 diabetes mellitus with ketoacidosis without coma: Secondary | ICD-10-CM

## 2012-06-05 DIAGNOSIS — R109 Unspecified abdominal pain: Secondary | ICD-10-CM | POA: Diagnosis not present

## 2012-06-05 DIAGNOSIS — D7289 Other specified disorders of white blood cells: Secondary | ICD-10-CM

## 2012-06-05 DIAGNOSIS — I10 Essential (primary) hypertension: Secondary | ICD-10-CM

## 2012-06-05 LAB — URINALYSIS, ROUTINE W REFLEX MICROSCOPIC
Glucose, UA: >1000 mg/dL — AB
Hgb urine dipstick: NEGATIVE
Ketones, ur: >80 mg/dL — AB
Leukocytes, UA: NEGATIVE
Nitrite: NEGATIVE
Protein, ur: 30 mg/dL — AB
Specific Gravity, Urine: 1.029 (ref 1.005–1.030)
Urobilinogen, UA: 0.2 mg/dL (ref 0.0–1.0)
pH: 5.5 (ref 5.0–8.0)

## 2012-06-05 LAB — CARDIAC PANEL(CRET KIN+CKTOT+MB+TROPI)
CK, MB: 1.2 ng/mL (ref 0.3–4.0)
CK, MB: 1.5 ng/mL (ref 0.3–4.0)
Relative Index: INVALID (ref 0.0–2.5)
Total CK: 47 U/L (ref 7–177)
Total CK: 49 U/L (ref 7–177)

## 2012-06-05 LAB — BASIC METABOLIC PANEL
BUN: 16 mg/dL (ref 6–23)
BUN: 16 mg/dL (ref 6–23)
BUN: 17 mg/dL (ref 6–23)
BUN: 15 mg/dL (ref 6–23)
BUN: 13 mg/dL (ref 6–23)
CO2: 16 mEq/L — ABNORMAL LOW (ref 19–32)
CO2: 14 mEq/L — ABNORMAL LOW (ref 19–32)
CO2: 9 mEq/L — CL (ref 19–32)
CO2: 13 mEq/L — ABNORMAL LOW (ref 19–32)
Calcium: 9.2 mg/dL (ref 8.4–10.5)
Calcium: 9.2 mg/dL (ref 8.4–10.5)
Calcium: 9.4 mg/dL (ref 8.4–10.5)
Calcium: 9 mg/dL (ref 8.4–10.5)
Calcium: 8.7 mg/dL (ref 8.4–10.5)
Chloride: 111 mEq/L (ref 96–112)
Chloride: 110 mEq/L (ref 96–112)
Chloride: 109 mEq/L (ref 96–112)
Chloride: 106 mEq/L (ref 96–112)
Chloride: 107 mEq/L (ref 96–112)
Creatinine, Ser: 1.26 mg/dL — ABNORMAL HIGH (ref 0.50–1.10)
Creatinine, Ser: 1.25 mg/dL — ABNORMAL HIGH (ref 0.50–1.10)
Creatinine, Ser: 1.13 mg/dL — ABNORMAL HIGH (ref 0.50–1.10)
Creatinine, Ser: 1.19 mg/dL — ABNORMAL HIGH (ref 0.50–1.10)
GFR calc Af Amer: 83 mL/min — ABNORMAL LOW (ref >90)
GFR calc Af Amer: 74 mL/min — ABNORMAL LOW (ref >90)
GFR calc Af Amer: 65 mL/min — ABNORMAL LOW (ref >90)
GFR calc Af Amer: 65 mL/min — ABNORMAL LOW (ref >90)
GFR calc Af Amer: 74 mL/min — ABNORMAL LOW (ref >90)
GFR calc Af Amer: 69 mL/min — ABNORMAL LOW (ref >90)
GFR calc non Af Amer: 72 mL/min — ABNORMAL LOW (ref >90)
GFR calc non Af Amer: 64 mL/min — ABNORMAL LOW (ref >90)
GFR calc non Af Amer: 56 mL/min — ABNORMAL LOW (ref >90)
GFR calc non Af Amer: 56 mL/min — ABNORMAL LOW (ref >90)
GFR calc non Af Amer: 64 mL/min — ABNORMAL LOW (ref >90)
GFR calc non Af Amer: 60 mL/min — ABNORMAL LOW (ref >90)
Glucose, Bld: 173 mg/dL — ABNORMAL HIGH (ref 70–99)
Glucose, Bld: 123 mg/dL — ABNORMAL HIGH (ref 70–99)
Glucose, Bld: 143 mg/dL — ABNORMAL HIGH (ref 70–99)
Glucose, Bld: 318 mg/dL — ABNORMAL HIGH (ref 70–99)
Glucose, Bld: 272 mg/dL — ABNORMAL HIGH (ref 70–99)
Potassium: 3.7 mEq/L (ref 3.5–5.1)
Potassium: 3.1 mEq/L — ABNORMAL LOW (ref 3.5–5.1)
Potassium: 2.7 mEq/L — CL (ref 3.5–5.1)
Potassium: 3.4 mEq/L — ABNORMAL LOW (ref 3.5–5.1)
Potassium: 4.2 mEq/L (ref 3.5–5.1)
Potassium: 3.9 mEq/L (ref 3.5–5.1)
Sodium: 140 mEq/L (ref 135–145)
Sodium: 138 mEq/L (ref 135–145)
Sodium: 138 mEq/L (ref 135–145)
Sodium: 137 mEq/L (ref 135–145)
Sodium: 133 mEq/L — ABNORMAL LOW (ref 135–145)
Sodium: 134 mEq/L — ABNORMAL LOW (ref 135–145)

## 2012-06-05 LAB — URINE MICROSCOPIC-ADD ON

## 2012-06-05 LAB — DIFFERENTIAL
Basophils Absolute: 0 10*3/uL (ref 0.0–0.1)
Basophils Relative: 0 % (ref 0–1)
Eosinophils Absolute: 0.2 10*3/uL (ref 0.0–0.7)
Eosinophils Relative: 2 % (ref 0–5)
Monocytes Absolute: 1.4 10*3/uL — ABNORMAL HIGH (ref 0.1–1.0)

## 2012-06-05 LAB — GLUCOSE, CAPILLARY
Glucose-Capillary: 162 mg/dL — ABNORMAL HIGH (ref 70–99)
Glucose-Capillary: 186 mg/dL — ABNORMAL HIGH (ref 70–99)
Glucose-Capillary: 169 mg/dL — ABNORMAL HIGH (ref 70–99)
Glucose-Capillary: 125 mg/dL — ABNORMAL HIGH (ref 70–99)
Glucose-Capillary: 146 mg/dL — ABNORMAL HIGH (ref 70–99)
Glucose-Capillary: 127 mg/dL — ABNORMAL HIGH (ref 70–99)
Glucose-Capillary: 340 mg/dL — ABNORMAL HIGH (ref 70–99)
Glucose-Capillary: 202 mg/dL — ABNORMAL HIGH (ref 70–99)

## 2012-06-05 LAB — HEMOGLOBIN A1C
Hgb A1c MFr Bld: 17.4 % — ABNORMAL HIGH (ref <5.7)
Mean Plasma Glucose: 453 mg/dL — ABNORMAL HIGH (ref <117)

## 2012-06-05 LAB — HEPATIC FUNCTION PANEL
Bilirubin, Direct: 0.1 mg/dL (ref 0.0–0.3)
Indirect Bilirubin: 0.3 mg/dL (ref 0.3–0.9)

## 2012-06-05 LAB — CBC
HCT: 38.1 % (ref 36.0–46.0)
MCHC: 34.6 g/dL (ref 30.0–36.0)
MCV: 83 fL (ref 78.0–100.0)
RDW: 14.1 % (ref 11.5–15.5)

## 2012-06-05 LAB — LIPID PANEL
Cholesterol: 232 mg/dL — ABNORMAL HIGH (ref 0–200)
Triglycerides: 118 mg/dL (ref <150)

## 2012-06-05 MED ORDER — INSULIN GLARGINE 100 UNIT/ML ~~LOC~~ SOLN
25.0000 [IU] | Freq: Every day | SUBCUTANEOUS | Status: DC
  Administered 2012-06-05: 25 [IU] via SUBCUTANEOUS

## 2012-06-05 MED ORDER — POLYETHYLENE GLYCOL 3350 17 G PO PACK
17.0000 g | PACK | Freq: Every day | ORAL | Status: DC
  Administered 2012-06-05 – 2012-06-06 (×2): 17 g via ORAL
  Filled 2012-06-05 (×4): qty 1

## 2012-06-05 MED ORDER — SODIUM CHLORIDE 0.9 % IV SOLN
INTRAVENOUS | Status: DC
  Administered 2012-06-05 – 2012-06-07 (×4): via INTRAVENOUS

## 2012-06-05 MED ORDER — POTASSIUM CHLORIDE CRYS ER 20 MEQ PO TBCR
40.0000 meq | EXTENDED_RELEASE_TABLET | ORAL | Status: AC
  Administered 2012-06-05 (×2): 40 meq via ORAL
  Filled 2012-06-05: qty 1
  Filled 2012-06-05: qty 2

## 2012-06-05 MED ORDER — SODIUM CHLORIDE 0.9 % IV SOLN
INTRAVENOUS | Status: DC
  Administered 2012-06-05: 09:00:00 via INTRAVENOUS

## 2012-06-05 MED ORDER — POTASSIUM CHLORIDE IN NACL 40-0.9 MEQ/L-% IV SOLN
INTRAVENOUS | Status: DC
  Administered 2012-06-05: 11:00:00 via INTRAVENOUS
  Filled 2012-06-05 (×4): qty 1000

## 2012-06-05 MED ORDER — SODIUM CHLORIDE 0.9 % IV SOLN
INTRAVENOUS | Status: DC
  Administered 2012-06-05: 2.6 [IU]/h via INTRAVENOUS
  Filled 2012-06-05: qty 1

## 2012-06-05 NOTE — Progress Notes (Signed)
Subjective: Patient states she feeling better. No CP, no SOB. Patient c/o lower abdominal pain.  Objective: Vital signs in last 24 hours: Filed Vitals:   06/05/12 0000 06/05/12 0100 06/05/12 0410 06/05/12 0712  BP: 131/83 138/91 104/64   Pulse: 103 99 99   Temp: 98.6 F (37 C)  98.3 F (36.8 C) 97.6 F (36.4 C)  TempSrc: Oral  Oral Oral  Resp: 22 24 22    Height:      Weight:      SpO2: 95% 97% 97%     Intake/Output Summary (Last 24 hours) at 06/05/12 0914 Last data filed at 06/05/12 0700  Gross per 24 hour  Intake 1898.75 ml  Output    750 ml  Net 1148.75 ml    Weight change:   General: Alert, awake, oriented x3, in no acute distress. HEENT: No bruits, no goiter. Heart: Regular rate and rhythm, without murmurs, rubs, gallops. Lungs: Clear to auscultation bilaterally. Abdomen: Soft, TTP in suprapubic region, nondistended, positive bowel sounds. Extremities: No clubbing cyanosis or edema with positive pedal pulses. Neuro: Grossly intact, nonfocal.    Lab Results:  Basename 06/05/12 0732 06/05/12 0415  NA 138 138  K 2.7* 3.1*  CL 110 109  CO2 16* 14*  GLUCOSE 123* 173*  BUN 16 15  CREATININE 1.26* 1.12*  CALCIUM 9.2 9.2  MG -- --  PHOS -- --   No results found for this basename: AST:2,ALT:2,ALKPHOS:2,BILITOT:2,PROT:2,ALBUMIN:2 in the last 72 hours No results found for this basename: LIPASE:2,AMYLASE:2 in the last 72 hours  Basename 06/04/12 1653 06/04/12 1146  WBC 14.6* 14.4*  NEUTROABS -- 10.8*  HGB 14.6 15.3*  HCT 41.7 46.9*  MCV 84.4 88.0  PLT 306 322    Basename 06/05/12 0732 06/05/12 0015 06/04/12 1653  CKTOTAL 47 49 56  CKMB 1.2 1.5 1.4  CKMBINDEX -- -- --  TROPONINI <0.30 <0.30 <0.30   No components found with this basename: POCBNP:3  Basename 06/04/12 1517  DDIMER 0.31   No results found for this basename: HGBA1C:2 in the last 72 hours  Basename 06/05/12 0415  CHOL 232*  HDL 60  LDLCALC 148*  TRIG 118  CHOLHDL 3.9  LDLDIRECT --    No results found for this basename: TSH,T4TOTAL,FREET3,T3FREE,THYROIDAB in the last 72 hours No results found for this basename: VITAMINB12:2,FOLATE:2,FERRITIN:2,TIBC:2,IRON:2,RETICCTPCT:2 in the last 72 hours  Micro Results: Recent Results (from the past 240 hour(s))  CULTURE, BLOOD (ROUTINE X 2)     Status: Normal (Preliminary result)   Collection Time   06/04/12  3:10 PM      Component Value Range Status Comment   Specimen Description BLOOD ARM RIGHT   Final    Special Requests BOTTLES DRAWN AEROBIC AND ANAEROBIC 10CC   Final    Culture  Setup Time [id number]   Final    Culture     Final    Value:        BLOOD CULTURE RECEIVED NO GROWTH TO DATE CULTURE WILL BE HELD FOR 5 DAYS BEFORE ISSUING A FINAL NEGATIVE REPORT   Report Status PENDING   Incomplete   CULTURE, BLOOD (ROUTINE X 2)     Status: Normal (Preliminary result)   Collection Time   06/04/12  3:25 PM      Component Value Range Status Comment   Specimen Description BLOOD HAND RIGHT   Final    Special Requests BOTTLES DRAWN AEROBIC ONLY 10CC   Final    Culture  Setup Time [id number]   Final  Culture     Final    Value:        BLOOD CULTURE RECEIVED NO GROWTH TO DATE CULTURE WILL BE HELD FOR 5 DAYS BEFORE ISSUING A FINAL NEGATIVE REPORT   Report Status PENDING   Incomplete   MRSA PCR SCREENING     Status: Normal   Collection Time   06/04/12  4:08 PM      Component Value Range Status Comment   MRSA by PCR NEGATIVE  NEGATIVE Final     Studies/Results: Dg Chest 2 View  06/04/2012  *RADIOLOGY REPORT*  Clinical Data: Shortness of breath.  CHEST - 2 VIEW  Comparison: 08/25/2011.  Findings: Trachea is midline.  Heart size normal.  Lungs are clear. No pleural fluid.  IMPRESSION: No acute findings.  Original Report Authenticated By: Reyes Ivan, M.D.    Medications:     . 0.9 % NaCl with KCl 20 mEq / L   Intravenous Once  . atorvastatin  20 mg Oral q1800  . enoxaparin  40 mg Subcutaneous Q24H  . insulin aspart   0-15 Units Subcutaneous TID WC  . insulin aspart  15 Units Subcutaneous Once  . insulin glargine  25 Units Subcutaneous Daily  . labetalol  200 mg Oral BID  . labetalol  5 mg Intravenous Once  . labetalol  5 mg Intravenous Once  . pantoprazole  40 mg Oral Q0600  . potassium chloride  10 mEq Intravenous Q1H  . potassium chloride  40 mEq Oral Q4H  . sodium chloride  1,000 mL Intravenous Once  . DISCONTD: insulin regular  10 Units Intravenous Once  . DISCONTD: insulin regular  15 Units Subcutaneous Once  . DISCONTD: insulin regular  15 Units Subcutaneous Once    Assessment: Principal Problem:  *DKA (diabetic ketoacidoses) Active Problems:  Leukocytosis  Dehydration  HTN (hypertension)  Hyperlipidemia  Diabetes mellitus  Abdominal pain   Plan: #1. Diabetic ketoacidosis  Questionable etiology. Likely secondary to medical noncompliance as patient has been out of her medications over the past couple of months secondary to financial issues. Cardiac enzymes negative x3. Urinalysis was negative. Lactic acid a pro calcitonin levels were within normal limits. Blood cultures are pending. CBC is pending. Repeat chest x-ray pending. Patient is been transitioned off the glucose stabilizer to Lantus. Anion gap is now 12. Will increase IV fluids 250 cc per hour. Sliding scale insulin. Hemoglobin A1c is pending. Diabetes coordinator consult is pending. Case manager consult is also pending to help with financial issues. #2 leukocytosis  Questionable etiology. Repeat chest x-ray pending.  Check Urinalysis with cultures and sensitivities as patient is complaining of lower suprapubic abdominal pain. Blood cultures pending. Lactic acid a pro calcitonin level within normal limits. Monitor for now. No need for antibiotics at this time.  #3 hypertension  Secondary to medical noncompliance secondary to financial constraints. Per last discharge summary from last year patient was on 100 mg olmesartan daily and  200 mg of labetalol twice daily and HCTZ 25 mg daily. Continue labetalol 200 mg twice daily and follow. If more blood pressure management is needed consider adding losartan at 100 mg daily.  #4 hypokalemia Secondary to fluid shifts with resolution of DKA. Will check a magnesium level. Replete. #5 abdominal pain Patient's abdominal pain is more in the suprapubic region. Will repeat a urinalysis with cultures and sensitivities. Check a KUB. Check a hepatic panel. Follow. #6 hyperlipidemia  Fasting lipid panel with an LDL of 148. Per last discharge summary  patient was on 20 mg of Lipitor daily. Continue Lipitor 20 mg daily and will need repeat labs as outpatient.  #7 dehydration  Likely secondary to problem #1. Continue IV fluids.  #8 diabetes mellitus  Hemoglobin A1c. See problem #1. Once patient is a AG Has been closed we will place patient on Lantus at approximately 20-25 units daily. And will start her on a sliding scale insulin. Consult with diabetes coordinator.  #9 prophylaxis  PPI for GI prophylaxis. Lovenox for DVT prophylaxis.  #10 financial constraints  We'll consult with case manager.       LOS: 1 day   Talon Regala [phone] 06/05/2012, 9:14 AM

## 2012-06-05 NOTE — Progress Notes (Signed)
   CARE MANAGEMENT NOTE 06/05/2012  Patient:  Rebecca Rhodes, Rebecca Rhodes   Account Number:  [id number]  Date Initiated:  06/05/2012  Documentation initiated by:  Darlyne Russian  Subjective/Objective Assessment:   Patient admitted with DKA     Action/Plan:   Progression of care and discharge planning   Anticipated DC Date:  06/08/2012   Anticipated DC Plan:  HOME/SELF CARE      DC Planning Services  CM consult      Choice offered to / List presented to:             Status of service:  In process, will continue to follow Medicare Important Message given?   (If response is "NO", the following Medicare IM given date fields will be blank) Date Medicare IM given:   Date Additional Medicare IM given:    Discharge Disposition:    Per UR Regulation:  Reviewed for med. necessity/level of care/duration of stay  If discussed at Long Length of Stay Meetings, dates discussed:    Comments:    06/05/2012  [address] RN, [place] [phone] Spoke with patient regarding MD follow up and cost of medications. [facility]: eligibility appointment 06/22/12 at 11:45 am, gave patient listing of documentation required at visit MD follow up:  Dr Georganna Skeans  07/02/12  at 10:15 am   co pay $10.00 Medications at [facility] or [facility] has $4.00 generics. ZZ fund: she can be discharged with insulin from hospital Stressed to patient the importance of keeping the appointment at [facility] to maintain the medical follow up.  Provided update to Diabetic Coordinator and Nutritional Management staff.  06/05/2012  1500  Darlyne Russian RN, [place]  [phone] Call to Pharm spoke with Sadie, patient is eligble for the ZZ fund for insulin.  06/05/2012 1400 Darlyne Russian RN, [place] [phone] Call to [facility] regarding patient status. The patient needs an eligibility appointment to be seen by MD. Appointment for Eligibility 06/22/12 11:45 am. MD follow up appointment 07/02/12 10:15 am with Dr. Georganna Skeans. Copay for MD  visit is $10.00.  06/05/2012  [address] RN, [place] [phone] Met with patient to discuss discharge planning and medical follow up costs and medications.  She had been seen at [facility] clinic in the past, has not been for several months, could not afford the co pay $25. At [facility] she got her medication refills. She has a glucometer, but needs a battery, does not have test strips.  She has no income, had applied for Medicaid in the past and did not qualify.  CM to follow up with discharge planning.

## 2012-06-05 NOTE — Progress Notes (Signed)
Inpatient Diabetes Program Recommendations  AACE/ADA: New Consensus Statement on Inpatient Glycemic Control (2009)  Target Ranges:  Prepandial:   less than 140 mg/dL      Peak postprandial:   less than 180 mg/dL (1-2 hours)      Critically ill patients:  140 - 180 mg/dL   Reason for Visit: Hyperglycemia Rebecca Rhodes is a 31 year old African American female with a past medical history of hypertension, diabetes mellitus insulin-dependent, hyperlipidemia who presents to the ED with a three-day history of some shortness of breath, polydipsia, polyuria, seeing spots in her right eye, generalized weakness and fatigue. Patient also does endorse some heartburn. Patient denies any fever, no chills, no chest pain, no nausea, no vomiting, no diarrhea, no constipation, no dysuria, no cough. Patient states that she's run out of all her medications over the past 2 months did not have the money to pay her co-pay in order to see her PCP to get refills on the medications. Patient presented to the ED was noted to have blood sugars greater than 600.   Pt states she cannot afford $20 copay to go to [facility].  Has meter but needs batteries and strips. Has lost significant amt of weight in past 6 months since no diabetes meds.  States she knows what to do for diet, but still continues to drink juice, regular soda and whatever food is available.      Inpatient Diabetes Program Recommendations Insulin - Meal Coverage: Add meal coverage insulin when pt eating > 50% meals Diet: Increase to CHO-mod med  Note: Discussed above with RN.  States case management saw pt this am.  Needs lots of support.  Pt states she did attend [employer] for 2 years but quit d/t finances.  Said she now lives with boyfriend and has no transportation, and no means to get meds at this time.  Will continue to follow.

## 2012-06-05 NOTE — Progress Notes (Signed)
INITIAL ADULT NUTRITION ASSESSMENT Date: 06/05/2012   Time: 3:26 PM  Reason for Assessment: Nutrition Risk--unintentional weight loss  ASSESSMENT: Female 31 y.o.  Dx: DKA (diabetic ketoacidoses)  Hx:  Past Medical History  Diagnosis Date  . Diabetes mellitus   . Hypertension   . HTN (hypertension) 06/04/2012  . Hyperlipidemia 06/04/2012  . Diabetes mellitus 06/04/2012    Related Meds:  Scheduled Meds:    . atorvastatin  20 mg Oral q1800  . enoxaparin  40 mg Subcutaneous Q24H  . insulin aspart  0-15 Units Subcutaneous TID WC  . insulin glargine  25 Units Subcutaneous Daily  . labetalol  200 mg Oral BID  . labetalol  5 mg Intravenous Once  . labetalol  5 mg Intravenous Once  . pantoprazole  40 mg Oral Q0600  . polyethylene glycol  17 g Oral Daily  . potassium chloride  10 mEq Intravenous Q1H  . potassium chloride  40 mEq Oral Q4H   Continuous Infusions:    . sodium chloride    . 0.9 % NaCl with KCl 40 mEq / L 150 mL/hr at 06/05/12 1033  . DISCONTD: sodium chloride Stopped (06/04/12 1915)  . DISCONTD: sodium chloride 150 mL/hr at 06/05/12 0841  . DISCONTD: dextrose 5 % and 0.45% NaCl 125 mL/hr at 06/04/12 1915  . DISCONTD: insulin (NOVOLIN-R) infusion 4 Units/hr (06/04/12 1423)  . DISCONTD: insulin (NOVOLIN-R) infusion 6.5 Units/hr (06/05/12 0718)   PRN Meds:.dextrose, gi cocktail   Ht: 5\' 4"  (162.6 cm)  Wt: 222 lb 3.6 oz (100.8 kg)  Ideal Wt: 54.5 kg % Ideal Wt: 185%  Usual Wt: unknown  Body mass index is 38.14 kg/(m^2). class 2 obesity  Food/Nutrition Related Hx: drinks fruit juice, regular sodas on a regular basis.  Weight loss PTA--patient unable to quantify amount  Labs:  CMP     Component Value Date/Time   NA 137 06/05/2012 1131   K 3.4* 06/05/2012 1131   CL 109 06/05/2012 1131   CO2 14* 06/05/2012 1131   GLUCOSE 143* 06/05/2012 1131   BUN 17 06/05/2012 1131   CREATININE 1.25* 06/05/2012 1131   CALCIUM 9.4 06/05/2012 1131   PROT 7.4 06/05/2012 1131   ALBUMIN 3.2* 06/05/2012 1131   AST 11 06/05/2012 1131   ALT <5 06/05/2012 1131   ALKPHOS 68 06/05/2012 1131   BILITOT 0.4 06/05/2012 1131   GFRNONAA 56* 06/05/2012 1131   GFRAA 65* 06/05/2012 1131    CBG (last 3)   Basename 06/05/12 1120 06/05/12 1023 06/05/12 0835  GLUCAP 127* 110* 146*     Intake/Output Summary (Last 24 hours) at 06/05/12 1526 Last data filed at 06/05/12 0700  Gross per 24 hour  Intake 1898.75 ml  Output    750 ml  Net 1148.75 ml     Diet Order: Clear Liquid   IVF:     sodium chloride   0.9 % NaCl with KCl 40 mEq / L Last Rate: 150 mL/hr at 06/05/12 1033  DISCONTD: sodium chloride Last Rate: Stopped (06/04/12 1915)  DISCONTD: sodium chloride Last Rate: 150 mL/hr at 06/05/12 0841  DISCONTD: dextrose 5 % and 0.45% NaCl Last Rate: 125 mL/hr at 06/04/12 1915  DISCONTD: insulin (NOVOLIN-R) infusion Last Rate: 4 Units/hr (06/04/12 1423)  DISCONTD: insulin (NOVOLIN-R) infusion Last Rate: 6.5 Units/hr (06/05/12 0718)    Estimated Nutritional Needs:   Kcal: 1700-1900 Protein: 85-100 grams Fluid: 1.7-1.9 liters  Patient reports weight loss, but unsure of amount.  Likely related to uncontrolled diabetes as well  as poor intake PTA.  Patient said that she has been giving her food to her boyfriend, so she has not been eating as much.    Discussed CHO-counting for diabetes, portion sizes, basic guidelines for diabetes diet.  Patient said she could follow these guidelines after discharge, however, question her motivation.  Handouts provided with RD contact information for any questions.  Patient is still on Clear Liquids at this time.  Tolerating well.   NUTRITION DIAGNOSIS: -Food and nutrition related knowledge deficit (NB-1.1).  Status: Resolved  RELATED TO: lack of previous diabetes diet education  AS EVIDENCED BY: uncontrolled glucose and patient reports no previous diet education  MONITORING/EVALUATION(Goals): Goals:  1. Intake to meet 90-100% of  estimated nutrition needs, unmet. 2. Patient to list 3 dietary sources of CHO, met.  EDUCATION NEEDS: -Education needs [employer] education provided, as above.  INTERVENTION:  Diabetes diet education provided.  RD contact information provided.  Recommend OP diabetes education at [employer].  Recommend advance diet as tolerated to CHO-modified medium to best meet estimated needs.   DOCUMENTATION CODES Per approved criteria  -Obesity Unspecified    Joaquin Courts, RD, CNSC, LDN Pager# [phone] After Hours Pager# [phone]  06/05/2012, 3:26 PM

## 2012-06-06 DIAGNOSIS — D7289 Other specified disorders of white blood cells: Secondary | ICD-10-CM

## 2012-06-06 DIAGNOSIS — E782 Mixed hyperlipidemia: Secondary | ICD-10-CM

## 2012-06-06 DIAGNOSIS — E1010 Type 1 diabetes mellitus with ketoacidosis without coma: Secondary | ICD-10-CM

## 2012-06-06 DIAGNOSIS — I10 Essential (primary) hypertension: Secondary | ICD-10-CM

## 2012-06-06 LAB — BASIC METABOLIC PANEL
BUN: 13 mg/dL (ref 6–23)
BUN: 11 mg/dL (ref 6–23)
BUN: 9 mg/dL (ref 6–23)
BUN: 10 mg/dL (ref 6–23)
CO2: 13 mEq/L — ABNORMAL LOW (ref 19–32)
CO2: 13 mEq/L — ABNORMAL LOW (ref 19–32)
CO2: 14 mEq/L — ABNORMAL LOW (ref 19–32)
Calcium: 8.9 mg/dL (ref 8.4–10.5)
Calcium: 8.7 mg/dL (ref 8.4–10.5)
Calcium: 8.4 mg/dL (ref 8.4–10.5)
Calcium: 8.6 mg/dL (ref 8.4–10.5)
Chloride: 111 mEq/L (ref 96–112)
Chloride: 109 mEq/L (ref 96–112)
Chloride: 109 mEq/L (ref 96–112)
Creatinine, Ser: 0.94 mg/dL (ref 0.50–1.10)
Creatinine, Ser: 0.93 mg/dL (ref 0.50–1.10)
Creatinine, Ser: 0.88 mg/dL (ref 0.50–1.10)
Creatinine, Ser: 0.94 mg/dL (ref 0.50–1.10)
GFR calc Af Amer: >90 mL/min (ref >90)
GFR calc Af Amer: >90 mL/min (ref >90)
GFR calc Af Amer: >90 mL/min (ref >90)
GFR calc non Af Amer: 79 mL/min — ABNORMAL LOW (ref >90)
GFR calc non Af Amer: 80 mL/min — ABNORMAL LOW (ref >90)
GFR calc non Af Amer: 86 mL/min — ABNORMAL LOW (ref >90)
GFR calc non Af Amer: 79 mL/min — ABNORMAL LOW (ref >90)
Glucose, Bld: 158 mg/dL — ABNORMAL HIGH (ref 70–99)
Glucose, Bld: 138 mg/dL — ABNORMAL HIGH (ref 70–99)
Glucose, Bld: 228 mg/dL — ABNORMAL HIGH (ref 70–99)
Glucose, Bld: 288 mg/dL — ABNORMAL HIGH (ref 70–99)
Potassium: 3.5 mEq/L (ref 3.5–5.1)
Potassium: 3.3 mEq/L — ABNORMAL LOW (ref 3.5–5.1)
Potassium: 3.6 mEq/L (ref 3.5–5.1)
Sodium: 135 mEq/L (ref 135–145)
Sodium: 137 mEq/L (ref 135–145)
Sodium: 136 mEq/L (ref 135–145)
Sodium: 134 mEq/L — ABNORMAL LOW (ref 135–145)

## 2012-06-06 LAB — COMPREHENSIVE METABOLIC PANEL
Alkaline Phosphatase: 61 U/L (ref 39–117)
BUN: 11 mg/dL (ref 6–23)
CO2: 12 mEq/L — ABNORMAL LOW (ref 19–32)
Chloride: 114 mEq/L — ABNORMAL HIGH (ref 96–112)
GFR calc Af Amer: >90 mL/min (ref >90)
GFR calc non Af Amer: 88 mL/min — ABNORMAL LOW (ref >90)
Glucose, Bld: 121 mg/dL — ABNORMAL HIGH (ref 70–99)
Potassium: 3.5 mEq/L (ref 3.5–5.1)
Total Bilirubin: 0.4 mg/dL (ref 0.3–1.2)

## 2012-06-06 LAB — GLUCOSE, CAPILLARY: Glucose-Capillary: 110 mg/dL — ABNORMAL HIGH (ref 70–99)

## 2012-06-06 LAB — URINE CULTURE
Colony Count: >=100000
Culture  Setup Time: 201306250225

## 2012-06-06 LAB — CBC
HCT: 35.4 % — ABNORMAL LOW (ref 36.0–46.0)
Hemoglobin: 12.1 g/dL (ref 12.0–15.0)
MCHC: 34.2 g/dL (ref 30.0–36.0)
WBC: 7.6 10*3/uL (ref 4.0–10.5)

## 2012-06-06 MED ORDER — LEVOFLOXACIN 750 MG PO TABS
750.0000 mg | ORAL_TABLET | Freq: Every day | ORAL | Status: DC
  Administered 2012-06-07: 750 mg via ORAL
  Filled 2012-06-06 (×2): qty 1

## 2012-06-06 MED ORDER — POTASSIUM CHLORIDE CRYS ER 20 MEQ PO TBCR
40.0000 meq | EXTENDED_RELEASE_TABLET | Freq: Every day | ORAL | Status: DC
  Administered 2012-06-06 – 2012-06-08 (×3): 40 meq via ORAL
  Filled 2012-06-06 (×4): qty 2

## 2012-06-06 MED ORDER — INSULIN ASPART 100 UNIT/ML ~~LOC~~ SOLN
0.0000 [IU] | Freq: Three times a day (TID) | SUBCUTANEOUS | Status: DC
  Administered 2012-06-06: 11 [IU] via SUBCUTANEOUS

## 2012-06-06 MED ORDER — INSULIN ASPART 100 UNIT/ML ~~LOC~~ SOLN
0.0000 [IU] | Freq: Three times a day (TID) | SUBCUTANEOUS | Status: DC
  Administered 2012-06-06: 5 [IU] via SUBCUTANEOUS
  Administered 2012-06-06: 2 [IU] via SUBCUTANEOUS

## 2012-06-06 MED ORDER — INSULIN ASPART 100 UNIT/ML ~~LOC~~ SOLN
0.0000 [IU] | Freq: Every day | SUBCUTANEOUS | Status: DC
  Administered 2012-06-06: 4 [IU] via SUBCUTANEOUS

## 2012-06-06 MED ORDER — ACETAMINOPHEN 325 MG PO TABS: 650.0000 mg | ORAL_TABLET | Freq: Four times a day (QID) | ORAL | Status: DC | PRN

## 2012-06-06 MED ORDER — INSULIN GLARGINE 100 UNIT/ML ~~LOC~~ SOLN
25.0000 [IU] | Freq: Every day | SUBCUTANEOUS | Status: DC
  Administered 2012-06-06: 25 [IU] via SUBCUTANEOUS

## 2012-06-06 MED ORDER — INSULIN NPH (HUMAN) (ISOPHANE) 100 UNIT/ML ~~LOC~~ SUSP
20.0000 [IU] | Freq: Two times a day (BID) | SUBCUTANEOUS | Status: DC
  Administered 2012-06-06: 20 [IU] via SUBCUTANEOUS
  Filled 2012-06-06: qty 10

## 2012-06-06 MED ORDER — INSULIN ASPART 100 UNIT/ML ~~LOC~~ SOLN
4.0000 [IU] | Freq: Three times a day (TID) | SUBCUTANEOUS | Status: DC
  Administered 2012-06-06: 4 [IU] via SUBCUTANEOUS

## 2012-06-06 MED ORDER — INSULIN ASPART 100 UNIT/ML ~~LOC~~ SOLN
0.0000 [IU] | SUBCUTANEOUS | Status: DC
  Administered 2012-06-06: 1 [IU] via SUBCUTANEOUS

## 2012-06-06 NOTE — Progress Notes (Signed)
[facility] [place] TEAM 1 - Stepdown/ICU TEAM  PCP:  Georganna Skeans, MD  Subjective: 31 year old female with a past medical history of hypertension, diabetes mellitus insulin-dependent, and hyperlipidemia who presented to the ED with a three-day history of some shortness of breath, polydipsia, polyuria, seeing spots in her right eye, generalized weakness and fatigue.  Patient stated that she's run out of all her medications over the past 2 months did not have the money to pay her co-pay in order to see her PCP to get refills on the medications. Patient presented to the ED was noted to have blood sugars greater than 600.  Todayt the pt is feeling much better.  She reports that her abdom pain has resolved.  She denies f/c, n/v, sob, or cp.    Objective:  Intake/Output Summary (Last 24 hours) at 06/06/12 1446 Last data filed at 06/06/12 0900  Gross per 24 hour  Intake 2482.5 ml  Output      0 ml  Net 2482.5 ml   Blood pressure 154/110, pulse 91, temperature 98.4 F (36.9 C), temperature source Oral, resp. rate 20, height 5\' 4"  (1.626 m), weight 100.8 kg (222 lb 3.6 oz), last menstrual period 05/13/2012, SpO2 100.00%.  CBG (last 3)   Basename 06/06/12 0811 06/06/12 0606 06/06/12 0329  GLUCAP 137* 131* 124*   Physical Exam: General: No acute respiratory distress Lungs: Clear to auscultation bilaterally without wheezes or crackles Cardiovascular: Regular rate and rhythm without murmur gallop or rub  Abdomen: overweight, nontender, nondistended, soft, bowel sounds positive, no rebound, no ascites, no appreciable mass Extremities: No significant cyanosis, clubbing, or edema bilateral lower extremities  Lab Results:  Basename 06/06/12 1120 06/06/12 0705 06/06/12 0319 06/05/12 1131  NA 136 137 139 --  K 3.6 3.3* 3.5 --  CL 109 109 114* --  CO2 14* 13* 12* --  GLUCOSE 228* 138* 121* --  BUN 9 11 11  --  CREATININE 0.88 0.93 0.86 --  CALCIUM 8.4 8.7 8.5 --  MG -- -- -- 1.8    PHOS -- -- -- --    Basename 06/06/12 0319 06/05/12 1131  AST 11 11  ALT <5 <5  ALKPHOS 61 68  BILITOT 0.4 0.4  PROT 6.5 7.4  ALBUMIN 2.7* 3.2*    Basename 06/06/12 0319 06/05/12 1131 06/04/12 1653 06/04/12 1146  WBC 7.6 9.9 14.6* --  NEUTROABS -- 5.1 -- 10.8*  HGB 12.1 13.2 14.6 --  HCT 35.4* 38.1 41.7 --  MCV 83.1 83.0 84.4 --  PLT 244 297 306 --    Basename 06/05/12 0732 06/05/12 0015 06/04/12 1653  CKTOTAL 47 49 56  CKMB 1.2 1.5 1.4  CKMBINDEX -- -- --  TROPONINI <0.30 <0.30 <0.30    Basename 06/04/12 1653  HGBA1C 17.4*    Micro Results: Recent Results (from the past 240 hour(s))  CULTURE, BLOOD (ROUTINE X 2)     Status: Normal (Preliminary result)   Collection Time   06/04/12  3:10 PM      Component Value Range Status Comment   Specimen Description BLOOD ARM RIGHT   Final    Special Requests BOTTLES DRAWN AEROBIC AND ANAEROBIC 10CC   Final    Culture  Setup Time [id number]   Final    Culture     Final    Value:        BLOOD CULTURE RECEIVED NO GROWTH TO DATE CULTURE WILL BE HELD FOR 5 DAYS BEFORE ISSUING A FINAL NEGATIVE REPORT   Report Status  PENDING   Incomplete   CULTURE, BLOOD (ROUTINE X 2)     Status: Normal (Preliminary result)   Collection Time   06/04/12  3:25 PM      Component Value Range Status Comment   Specimen Description BLOOD HAND RIGHT   Final    Special Requests BOTTLES DRAWN AEROBIC ONLY 10CC   Final    Culture  Setup Time [id number]   Final    Culture     Final    Value:        BLOOD CULTURE RECEIVED NO GROWTH TO DATE CULTURE WILL BE HELD FOR 5 DAYS BEFORE ISSUING A FINAL NEGATIVE REPORT   Report Status PENDING   Incomplete   MRSA PCR SCREENING     Status: Normal   Collection Time   06/04/12  4:08 PM      Component Value Range Status Comment   MRSA by PCR NEGATIVE  NEGATIVE Final   URINE CULTURE     Status: Normal   Collection Time   06/05/12  1:15 AM      Component Value Range Status Comment   Specimen Description URINE, CLEAN  CATCH   Final    Special Requests NONE   Final    Culture  Setup Time [id number]   Final    Colony Count >=100,000 COLONIES/ML   Final    Culture NMS   Final    Report Status 06/06/2012 FINAL   Final    Studies/Results: All recent x-ray/radiology reports have been reviewed in detail.   Medications: I have reviewed the patient's complete medication list.  Assessment/Plan:  Diabetic ketoacidosis  secondary to medical noncompliance as patient has been out of her medications over the past couple of months secondary to financial issues - bicarb has still not normalized, though gap is only 13 - renal function is normal - it appears that she remains at risk for needing to return to the insulin gtt as she remains at risk to lapse back into severe DKA - will watch bicarb and anion gap closely - adjust tx plan  Leukocytosis  No evidence of acute infection - likely stress demargination related to DKA itself  Hypertension  Secondary to medical noncompliance secondary to financial constraints - per last discharge summary (2012) patient was on 100 mg olmesartan daily and 200 mg of labetalol twice daily and HCTZ 25 mg daily - continue labetalol 200 mg twice daily - add ACE inhibitor due to DM   Hypokalemia  Secondary to fluid shifts with tx of DKA - magnesium level normal - continue to replete  abdominal pain  Patient's abdominal pain was more in the suprapubic region - likely the stereotypical pain associated w/ DKA - sx have resolved at this time  UTI UA concerning for possible acute UTI - tx empirically - f/u cx  hyperlipidemia  Fasting lipid panel with an LDL of 148 - per last discharge summary patient was on 20 mg of Lipitor daily - continue Lipitor 20 mg daily and will need repeat labs as outpatient  dehydration  Due to DKA - resolved   diabetes mellitus  Hemoglobin A1c 17.4 - admits was noncompliant w/ meds at home - adjust tx plan and follow CBG  prophylaxis  Lovenox for DVT  prophylaxis.   financial constraints  case manager following - pt is set up for f/u at [facility] for transfer to medical bed - will likely need at least and additional 48hrs of hospitalization to stabilize CBGs/bicarb  Cherene Altes, MD [facility] Office  [phone] Pager [phone]  On-Call/Text Page:      [email or web address]      password [facility]

## 2012-06-06 NOTE — Progress Notes (Signed)
Report given to nurse on 5500 for pt trnsfer.  Room not clean will transfer to 5503 per floor. Pt without c/o.

## 2012-06-06 NOTE — Progress Notes (Signed)
Pt had an elevated bp of 156/109 at 1816 prior to my shift. Pt's bp was not rechecked prior to 1900. Pt's BP was rechecked at 2026 and pt still had an elevated bp of 154/99 checked manually and via dinamap. RN paged doctor on call. MD on call stated for pt to continue to monitor pt bp and not to give PRN order for bp meds at this time. RN will continue to monitor pt.

## 2012-06-07 DIAGNOSIS — E782 Mixed hyperlipidemia: Secondary | ICD-10-CM

## 2012-06-07 DIAGNOSIS — E1010 Type 1 diabetes mellitus with ketoacidosis without coma: Secondary | ICD-10-CM

## 2012-06-07 DIAGNOSIS — I10 Essential (primary) hypertension: Secondary | ICD-10-CM

## 2012-06-07 DIAGNOSIS — D7289 Other specified disorders of white blood cells: Secondary | ICD-10-CM

## 2012-06-07 LAB — BASIC METABOLIC PANEL
BUN: 7 mg/dL (ref 6–23)
CO2: 18 mEq/L — ABNORMAL LOW (ref 19–32)
Calcium: 8.2 mg/dL — ABNORMAL LOW (ref 8.4–10.5)
Calcium: 8.4 mg/dL (ref 8.4–10.5)
Chloride: 110 mEq/L (ref 96–112)
Chloride: 108 mEq/L (ref 96–112)
Creatinine, Ser: 0.82 mg/dL (ref 0.50–1.10)
GFR calc Af Amer: >90 mL/min (ref >90)
GFR calc Af Amer: >90 mL/min (ref >90)
GFR calc Af Amer: >90 mL/min (ref >90)
GFR calc Af Amer: >90 mL/min (ref >90)
GFR calc non Af Amer: >90 mL/min (ref >90)
GFR calc non Af Amer: >90 mL/min (ref >90)
GFR calc non Af Amer: 87 mL/min — ABNORMAL LOW (ref >90)
Glucose, Bld: 243 mg/dL — ABNORMAL HIGH (ref 70–99)
Potassium: 3.1 mEq/L — ABNORMAL LOW (ref 3.5–5.1)
Potassium: 3.1 mEq/L — ABNORMAL LOW (ref 3.5–5.1)
Potassium: 3.2 mEq/L — ABNORMAL LOW (ref 3.5–5.1)
Potassium: 3.5 mEq/L (ref 3.5–5.1)
Sodium: 138 mEq/L (ref 135–145)
Sodium: 139 mEq/L (ref 135–145)
Sodium: 138 mEq/L (ref 135–145)

## 2012-06-07 LAB — GLUCOSE, CAPILLARY
Glucose-Capillary: 314 mg/dL — ABNORMAL HIGH (ref 70–99)
Glucose-Capillary: 231 mg/dL — ABNORMAL HIGH (ref 70–99)
Glucose-Capillary: 257 mg/dL — ABNORMAL HIGH (ref 70–99)
Glucose-Capillary: 137 mg/dL — ABNORMAL HIGH (ref 70–99)
Glucose-Capillary: 219 mg/dL — ABNORMAL HIGH (ref 70–99)
Glucose-Capillary: 225 mg/dL — ABNORMAL HIGH (ref 70–99)
Glucose-Capillary: 150 mg/dL — ABNORMAL HIGH (ref 70–99)
Glucose-Capillary: 173 mg/dL — ABNORMAL HIGH (ref 70–99)
Glucose-Capillary: 205 mg/dL — ABNORMAL HIGH (ref 70–99)
Glucose-Capillary: 151 mg/dL — ABNORMAL HIGH (ref 70–99)
Glucose-Capillary: 188 mg/dL — ABNORMAL HIGH (ref 70–99)

## 2012-06-07 MED ORDER — DEXTROSE 5 % IV SOLN: 100.0000 mg | Freq: Two times a day (BID) | INTRAVENOUS | Status: DC

## 2012-06-07 MED ORDER — DOXYCYCLINE HYCLATE 100 MG PO TABS
100.0000 mg | ORAL_TABLET | Freq: Two times a day (BID) | ORAL | Status: DC
  Filled 2012-06-07 (×2): qty 1

## 2012-06-07 MED ORDER — WHITE PETROLATUM GEL
Status: AC
  Administered 2012-06-07: 10:00:00
  Filled 2012-06-07: qty 5

## 2012-06-07 MED ORDER — DEXTROSE 50 % IV SOLN: 25.0000 mL | INTRAVENOUS | Status: DC | PRN

## 2012-06-07 MED ORDER — DEXTROSE-NACL 5-0.45 % IV SOLN
INTRAVENOUS | Status: DC
  Administered 2012-06-07: 10:00:00 via INTRAVENOUS
  Administered 2012-06-08: 50 mL/h via INTRAVENOUS

## 2012-06-07 MED ORDER — INSULIN REGULAR BOLUS VIA INFUSION
0.0000 [IU] | Freq: Three times a day (TID) | INTRAVENOUS | Status: DC
  Administered 2012-06-07: 6.4 [IU] via INTRAVENOUS
  Administered 2012-06-07: 6.9 [IU] via INTRAVENOUS
  Filled 2012-06-07 (×2): qty 10

## 2012-06-07 MED ORDER — SODIUM CHLORIDE 0.9 % IV SOLN: INTRAVENOUS | Status: DC

## 2012-06-07 MED ORDER — POTASSIUM CHLORIDE CRYS ER 20 MEQ PO TBCR
40.0000 meq | EXTENDED_RELEASE_TABLET | Freq: Two times a day (BID) | ORAL | Status: AC
  Administered 2012-06-07: 40 meq via ORAL
  Filled 2012-06-07: qty 2

## 2012-06-07 MED ORDER — LISINOPRIL 10 MG PO TABS
10.0000 mg | ORAL_TABLET | Freq: Every day | ORAL | Status: DC
  Administered 2012-06-07 – 2012-06-08 (×2): 10 mg via ORAL
  Filled 2012-06-07 (×2): qty 1

## 2012-06-07 MED ORDER — SODIUM CHLORIDE 0.9 % IV SOLN
INTRAVENOUS | Status: DC
  Administered 2012-06-07: 1.9 [IU]/h via INTRAVENOUS
  Administered 2012-06-07: 21:00:00 via INTRAVENOUS
  Administered 2012-06-07: 9.9 [IU]/h via INTRAVENOUS
  Administered 2012-06-07: 21:00:00 via INTRAVENOUS
  Administered 2012-06-07: 5.9 [IU]/h via INTRAVENOUS
  Filled 2012-06-07 (×3): qty 1

## 2012-06-07 NOTE — Care Management Note (Signed)
    Page 1 of 2   06/08/2012     11:51:37 AM   CARE MANAGEMENT NOTE 06/08/2012  Patient:  Rebecca Rhodes, Rebecca Rhodes   Account Number:  [id number]  Date Initiated:  06/05/2012  Documentation initiated by:  Darlyne Russian  Subjective/Objective Assessment:   Patient admitted with DKA     Action/Plan:   Progression of care and discharge planning   Anticipated DC Date:  06/08/2012   Anticipated DC Plan:  HOME/SELF CARE      DC Planning Services  CM consult  Medication Assistance  [facility]      Choice offered to / List presented to:             Status of service:  Completed, signed off Medicare Important Message given?   (If response is "NO", the following Medicare IM given date fields will be blank) Date Medicare IM given:   Date Additional Medicare IM given:    Discharge Disposition:  HOME/SELF CARE  Per UR Regulation:  Reviewed for med. necessity/level of care/duration of stay  If discussed at Long Length of Stay Meetings, dates discussed:    Comments:  06/08/12 11:50 Letha Cape RN, BSN [phone] patient dc today.  06/07/12 11:27 Letha Cape RN, BSN [phone] spoke with patient she has [facility] apt coming up, we will need to ast patient with [facility] vial insulin at dc and a 3 day supply of meds because her apt is not until 7/12 and she will have to get rest of meds that will run out after the 3 day supply at [facility] for $4.  Patient lives with friend, she will have transportation at time of dc. NCM will continue to follow for dc needs.  06/05/2012  1530 Darlyne Russian RN, CCM [phone] Spoke with patient regarding MD follow up and cost of medications. [facility]: eligibility appointment 06/22/12 at 11:45 am, gave patient listing of documentation required at visit MD follow up:  Dr Georganna Skeans  07/02/12  at 10:15 am   co pay $10.00 Medications at [facility] or [facility] has $4.00 generics. ZZ fund: she can be discharged with insulin from hospital Stressed to  patient the importance of keeping the appointment at [facility] to maintain the medical follow up.  Provided update to Diabetic Coordinator and Nutritional Management staff.  06/05/2012  1500  Darlyne Russian RN, [place]  [phone] Call to Pharm spoke with Sadie, patient is eligble for the ZZ fund for insulin.  06/05/2012 1400 Darlyne Russian RN, [place] [phone] Call to [facility] regarding patient status. The patient needs an eligibility appointment to be seen by MD. Appointment for Eligibility 06/22/12 11:45 am. MD follow up appointment 07/02/12 10:15 am with Dr. Georganna Skeans. Copay for MD visit is $10.00.  06/05/2012  [address] RN, [place] [phone] Met with patient to discuss discharge planning and medical follow up costs and medications.  She had been seen at [facility] clinic in the past, has not been for several months, could not afford the co pay $25. At [facility] she got her medication refills. She has a glucometer, but needs a battery, does not have test strips.  She has no income, had applied for Medicaid in the past and did not qualify.  CM to follow up with discharge planning.

## 2012-06-07 NOTE — Progress Notes (Signed)
[facility] PROGRESS NOTE  Rebecca Rhodes ZOX:[id number] DOB: 03/03/1981 DOA: 06/04/2012   Assessment/Plan: Patient Active Hospital Problem List: DKA (diabetic ketoacidoses) (06/04/2012)/Diabetes mellitus (06/04/2012)  -she still has a bicarbonate of 15, and anion gap of 17 when corrected for the low albumin. I will resume the IV insulin. And continue B. meds every 4 and CBGs q. hourly. Also start her on D5. We'll not stop the insulin drip until the bicarbonate is greater than 20.  -Probably contributing to this acidosis is the abscess on her gluteus.  Leukocytosis (06/04/2012) -she has a boil which is draining purulent fluid. In between her gluteus. She has remained afebrile [place] count continues to go down Levaquin should cover anaerobes, streptococcus and staph.    acute kidney injury   is probably secondary to DKA does resolve with IV fluids.   HTN (hypertension) (06/04/2012)  blood pressure fluctuating between 128/87-153/100. We'll continue to titrate medications.   Hypokalemia (06/05/2012)  continue potassium supplements. Check a magnesium level.  Code Status: Full code Family Communication: Brother [phone] Disposition Plan: inpatient  Lambert Keto, MD  [facility] Pager [phone]  If 7PM-7AM, please contact night-coverage [email or web address] Password [email or web address] 06/07/2012, 10:18 AM   LOS: 3 days   Procedures:  None   Antibiotics:  levaquin  Subjective: She relates feels better. She is complaining of pain in between the gluteus.  Objective: Filed Vitals:   06/06/12 1816 06/06/12 2026 06/06/12 2027 06/07/12 0601  BP: 156/109 157/100 154/99 153/100  Pulse: 93 95  81  Temp: 98.4 F (36.9 C) 97.8 F (36.6 C)  97.6 F (36.4 C)  TempSrc: Oral Oral  Oral  Resp: 18 18  20   Height:      Weight:      SpO2: 93% 97%  100%    Intake/Output Summary (Last 24 hours) at 06/07/12 1018 Last data filed at 06/07/12 0626  Gross per 24 hour  Intake 2363.33 ml    Output      0 ml  Net 2363.33 ml   Weight change:   Exam:  General: Alert, awake, oriented x3, in no acute distress.  HEENT: No bruits, no goiter.  Heart: Regular rate and rhythm, without murmurs, rubs, gallops.  Lungs: Good air movement, bilateral air movement.  Abdomen: Soft, nontender, nondistended, positive bowel sounds.  Neuro: Grossly intact, nonfocal. Extremities: She has a boil on her inner part of her duties on the left which was drained, about 60 cc of purulent material came out. It was a squeezing tender to touch.   Data Reviewed: Basic Metabolic Panel:  Lab 06/07/12 6578 06/06/12 1738 06/06/12 1120 06/06/12 0705 06/06/12 0319 06/05/12 1131  NA 139 134* 136 137 139 --  K 3.1* 3.9 -- -- -- --  CL 109 105 109 109 114* --  CO2 15* 15* 14* 13* 12* --  GLUCOSE 208* 288* 228* 138* 121* --  BUN 7 10 9 11 11  --  CREATININE 0.82 0.94 0.88 0.93 0.86 --  CALCIUM 8.2* 8.6 8.4 8.7 8.5 --  MG -- -- -- -- -- 1.8  PHOS -- -- -- -- -- --   Liver Function Tests:  Lab 06/06/12 0319 06/05/12 1131  AST 11 11  ALT <5 <5  ALKPHOS 61 68  BILITOT 0.4 0.4  PROT 6.5 7.4  ALBUMIN 2.7* 3.2*   No results found for this basename: LIPASE:5,AMYLASE:5 in the last 168 hours No results found for this basename: AMMONIA:5 in the last 168 hours CBC:  Lab 06/06/12 0319  06/05/12 1131 06/04/12 1653 06/04/12 1146  WBC 7.6 9.9 14.6* 14.4*  NEUTROABS -- 5.1 -- 10.8*  HGB 12.1 13.2 14.6 15.3*  HCT 35.4* 38.1 41.7 46.9*  MCV 83.1 83.0 84.4 88.0  PLT 244 297 306 322   Cardiac Enzymes:  Lab 06/05/12 0732 06/05/12 0015 06/04/12 1653  CKTOTAL 47 49 56  CKMB 1.2 1.5 1.4  CKMBINDEX -- -- --  TROPONINI <0.30 <0.30 <0.30   BNP: No components found with this basename: POCBNP:5 CBG:  Lab 06/07/12 0907 06/07/12 0747 06/06/12 2151 06/06/12 1739 06/06/12 0811  GLUCAP 246* 204* 187* 254* 137*    Recent Results (from the past 240 hour(s))  CULTURE, BLOOD (ROUTINE X 2)     Status: Normal  (Preliminary result)   Collection Time   06/04/12  3:10 PM      Component Value Range Status Comment   Specimen Description BLOOD ARM RIGHT   Final    Special Requests BOTTLES DRAWN AEROBIC AND ANAEROBIC 10CC   Final    Culture  Setup Time [id number]   Final    Culture     Final    Value:        BLOOD CULTURE RECEIVED NO GROWTH TO DATE CULTURE WILL BE HELD FOR 5 DAYS BEFORE ISSUING A FINAL NEGATIVE REPORT   Report Status PENDING   Incomplete   CULTURE, BLOOD (ROUTINE X 2)     Status: Normal (Preliminary result)   Collection Time   06/04/12  3:25 PM      Component Value Range Status Comment   Specimen Description BLOOD HAND RIGHT   Final    Special Requests BOTTLES DRAWN AEROBIC ONLY 10CC   Final    Culture  Setup Time [id number]   Final    Culture     Final    Value:        BLOOD CULTURE RECEIVED NO GROWTH TO DATE CULTURE WILL BE HELD FOR 5 DAYS BEFORE ISSUING A FINAL NEGATIVE REPORT   Report Status PENDING   Incomplete   MRSA PCR SCREENING     Status: Normal   Collection Time   06/04/12  4:08 PM      Component Value Range Status Comment   MRSA by PCR NEGATIVE  NEGATIVE Final   URINE CULTURE     Status: Normal   Collection Time   06/05/12  1:15 AM      Component Value Range Status Comment   Specimen Description URINE, CLEAN CATCH   Final    Special Requests NONE   Final    Culture  Setup Time [id number]   Final    Colony Count >=100,000 COLONIES/ML   Final    Culture NMS   Final    Report Status 06/06/2012 FINAL   Final   URINE CULTURE     Status: Normal   Collection Time   06/05/12  1:45 PM      Component Value Range Status Comment   Specimen Description URINE, CLEAN CATCH   Final    Special Requests NONE   Final    Culture  Setup Time [id number]   Final    Colony Count >=100,000 COLONIES/ML   Final    Culture     Final    Value: Multiple bacterial morphotypes present, none predominant. Suggest appropriate recollection if clinically indicated.   Report Status  06/06/2012 FINAL   Final      Studies: Dg Chest 2 View  06/05/2012  *RADIOLOGY REPORT*  Clinical Data:  Shortness of breath.  High blood pressure.  Rule out pneumonia.  CHEST - 2 VIEW  Comparison: 06/04/2012  Findings: Lateral view degraded by patient arm position.  Midline trachea.  Normal heart size and mediastinal contours. No pleural effusion or pneumothorax.  Clear lungs.  IMPRESSION: No acute cardiopulmonary disease.  Original Report Authenticated By: Consuello Bossier, M.D.   Dg Chest 2 View  06/04/2012  *RADIOLOGY REPORT*  Clinical Data: Shortness of breath.  CHEST - 2 VIEW  Comparison: 08/25/2011.  Findings: Trachea is midline.  Heart size normal.  Lungs are clear. No pleural fluid.  IMPRESSION: No acute findings.  Original Report Authenticated By: Reyes Ivan, M.D.   Dg Abd 1 View  06/05/2012  *RADIOLOGY REPORT*  Clinical Data: Left lower quadrant pain  ABDOMEN - 1 VIEW  Comparison: None  Findings: Scattered gas and stool throughout colon. Additional air filled loop of bowel in the right mid abdomen, question large versus small bowel, potentially an air filled prominent sigmoid loop. No definite bowel wall thickening. Stool present rectum. Bones unremarkable. No urinary tract calcification.  IMPRESSION: Nonobstructive bowel gas pattern.  Original Report Authenticated By: Lollie Marrow, M.D.    Scheduled Meds:   . atorvastatin  20 mg Oral q1800  . insulin regular  0-10 Units Intravenous TID WC  . labetalol  200 mg Oral BID  . levofloxacin  750 mg Oral QAC lunch  . polyethylene glycol  17 g Oral Daily  . potassium chloride  40 mEq Oral Daily  . Marsee petrolatum      . DISCONTD: doxycycline  100 mg Oral Q12H  . DISCONTD: doxycycline (VIBRAMYCIN) IV  100 mg Intravenous Q12H  . DISCONTD: enoxaparin  40 mg Subcutaneous Q24H  . DISCONTD: insulin aspart  0-15 Units Subcutaneous TID WC  . DISCONTD: insulin aspart  0-20 Units Subcutaneous TID WC  . DISCONTD: insulin aspart  0-5 Units  Subcutaneous QHS  . DISCONTD: insulin aspart  4 Units Subcutaneous TID WC  . DISCONTD: insulin glargine  25 Units Subcutaneous QHS  . DISCONTD: insulin NPH  20 Units Subcutaneous BID AC  . DISCONTD: labetalol  5 mg Intravenous Once  . DISCONTD: pantoprazole  40 mg Oral Q0600   Continuous Infusions:   . sodium chloride 100 mL/hr at 06/07/12 0747  . dextrose 5 % and 0.45% NaCl 50 mL/hr at 06/07/12 0955  . insulin (NOVOLIN-R) infusion 1.9 Units/hr (06/07/12 0957)  . DISCONTD: sodium chloride    . DISCONTD: insulin (NOVOLIN-R) infusion Stopped (06/06/12 0300)

## 2012-06-07 NOTE — Progress Notes (Signed)
PT has an elevated bp of 153/100. Per MD's order RN will just continue to monitor pt's bp unless diastolic bp is above 100.

## 2012-06-08 DIAGNOSIS — E1010 Type 1 diabetes mellitus with ketoacidosis without coma: Secondary | ICD-10-CM

## 2012-06-08 DIAGNOSIS — L0231 Cutaneous abscess of buttock: Secondary | ICD-10-CM | POA: Diagnosis present

## 2012-06-08 DIAGNOSIS — D7289 Other specified disorders of white blood cells: Secondary | ICD-10-CM

## 2012-06-08 DIAGNOSIS — I10 Essential (primary) hypertension: Secondary | ICD-10-CM

## 2012-06-08 DIAGNOSIS — E782 Mixed hyperlipidemia: Secondary | ICD-10-CM

## 2012-06-08 LAB — BASIC METABOLIC PANEL
BUN: 9 mg/dL (ref 6–23)
CO2: 19 mEq/L (ref 19–32)
Calcium: 8.3 mg/dL — ABNORMAL LOW (ref 8.4–10.5)
Creatinine, Ser: 0.84 mg/dL (ref 0.50–1.10)
GFR calc Af Amer: >90 mL/min (ref >90)
GFR calc non Af Amer: >90 mL/min (ref >90)
Glucose, Bld: 143 mg/dL — ABNORMAL HIGH (ref 70–99)
Glucose, Bld: 152 mg/dL — ABNORMAL HIGH (ref 70–99)
Potassium: 3.2 mEq/L — ABNORMAL LOW (ref 3.5–5.1)
Sodium: 140 mEq/L (ref 135–145)

## 2012-06-08 LAB — GLUCOSE, CAPILLARY
Glucose-Capillary: 122 mg/dL — ABNORMAL HIGH (ref 70–99)
Glucose-Capillary: 128 mg/dL — ABNORMAL HIGH (ref 70–99)
Glucose-Capillary: 132 mg/dL — ABNORMAL HIGH (ref 70–99)
Glucose-Capillary: 169 mg/dL — ABNORMAL HIGH (ref 70–99)

## 2012-06-08 MED ORDER — INSULIN ASPART 100 UNIT/ML ~~LOC~~ SOLN: 0.0000 [IU] | Freq: Three times a day (TID) | SUBCUTANEOUS | Status: DC

## 2012-06-08 MED ORDER — INSULIN GLARGINE 100 UNIT/ML ~~LOC~~ SOLN: 30.0000 [IU] | Freq: Two times a day (BID) | SUBCUTANEOUS | Status: DC

## 2012-06-08 MED ORDER — LABETALOL HCL 200 MG PO TABS: 200.0000 mg | ORAL_TABLET | Freq: Two times a day (BID) | ORAL | Status: DC

## 2012-06-08 MED ORDER — LEVOFLOXACIN 750 MG PO TABS: 750.0000 mg | ORAL_TABLET | Freq: Every day | ORAL | Status: DC

## 2012-06-08 MED ORDER — INSULIN GLARGINE 100 UNIT/ML ~~LOC~~ SOLN: 30.0000 [IU] | Freq: Every day | SUBCUTANEOUS | Status: DC

## 2012-06-08 MED ORDER — INSULIN GLARGINE 100 UNIT/ML ~~LOC~~ SOLN
5.0000 [IU] | Freq: Once | SUBCUTANEOUS | Status: AC
  Administered 2012-06-08: 5 [IU] via SUBCUTANEOUS

## 2012-06-08 MED ORDER — LISINOPRIL 10 MG PO TABS: 10.0000 mg | ORAL_TABLET | Freq: Every day | ORAL | Status: DC

## 2012-06-08 MED ORDER — INSULIN ASPART 100 UNIT/ML ~~LOC~~ SOLN: 0.0000 [IU] | Freq: Every day | SUBCUTANEOUS | Status: DC

## 2012-06-08 MED ORDER — INSULIN GLARGINE 100 UNIT/ML ~~LOC~~ SOLN
25.0000 [IU] | Freq: Every day | SUBCUTANEOUS | Status: DC
  Administered 2012-06-08: 25 [IU] via SUBCUTANEOUS

## 2012-06-08 MED ORDER — INSULIN ASPART 100 UNIT/ML ~~LOC~~ SOLN: 4.0000 [IU] | Freq: Three times a day (TID) | SUBCUTANEOUS | Status: DC

## 2012-06-08 MED ORDER — INSULIN REGULAR HUMAN 100 UNIT/ML IJ SOLN: 10.0000 [IU] | Freq: Three times a day (TID) | INTRAMUSCULAR | Status: DC

## 2012-06-08 NOTE — Discharge Summary (Signed)
Physician Discharge Summary  Chandell Attridge HYQ:[id number] DOB: May 08, 1981 DOA: 06/04/2012  PCP: Georganna Skeans, MD  Admit date: 06/04/2012 Discharge date: 06/08/2012  Discharge Diagnoses:  Principal Problem:  *DKA (diabetic ketoacidoses) Active Problems:  Leukocytosis  Dehydration  HTN (hypertension)  Hyperlipidemia  Diabetes mellitus  Abdominal pain  Hypokalemia   Discharge Condition: Stable  Disposition:  Follow-up Information    Follow up with [facility]. (06/22/12- 1145- eligibilty appointment                  07/02/12 10:15  f/u appointment with Dr. Andrey Campanile (copay $10))    Contact information:   [phone]         Diet: Low-carb modified diet (ADA diet)  History of present illness:  31 year old African American female with a past medical history of hypertension, diabetes mellitus insulin-dependent, hyperlipidemia who presents to the ED with a three-day history of some shortness of breath, polydipsia, polyuria, seeing spots in her right eye, generalized weakness and fatigue. Patient also does endorse some heartburn. Patient denies any fever, no chills, no chest pain, no nausea, no vomiting, no diarrhea, no constipation, no dysuria, no cough. Patient states that she's run out of all her medications over the past 2 months did not have the money to pay her co-pay in order to see her PCP to get refills on the medications. Patient presented to the ED was noted to have blood sugars greater than 600. Be met which was done did show a sodium of 1:30 potassium of 4.1 chloride of 94 and a bicarbonate of less than 7 a BUN of 19 a creatinine of 1.25. Patient was placed on the insulin drip and she was given a liter of IV fluids. Will call to admit the patient for further evaluation and management.    Hospital Course:  Principal Problem: DKA (diabetic ketoacidoses): -She was admitted to the hospital start on IV insulin until her bicarbonate was greater than 20 and her anion gap closed.  She was able to tolerate her diet. She started on Lantus in transition to her home regimen. Her Lantus was increased to 30 units daily plus sliding scale insulin. The most likely cause of her DKA was probably noncompliance and her skin infection which was treated with Levaquin.  Abscess of the buttock She had a superficial abscesses on her buttock which drained about 60 cc of purulent material. She was started on Levaquin to cover anaerobes. She remained afebrile her [place] count resolved.  HTN (hypertension): At home she was on no blood pressure medications. While she was in the hospital her blood pressure remained high she was started on Cipro low dose and her blood pressure actually improved. We will continue her lisinopril she'll followup with her primary care doctor and will get a be met at that time.  Diabetes mellitus See DKA   Abdominal pain This is most likely secondary to her DKA does resolve. Abdominal x-ray was done that showed no acute intra-abdominal disease.   Discharge Exam: Filed Vitals:   06/08/12 0427  BP: 108/66  Pulse: 88  Temp: 99.1 F (37.3 C)  Resp: 18   Filed Vitals:   06/07/12 0601 06/07/12 1314 06/07/12 2302 06/08/12 0427  BP: 153/100 156/105 120/69 108/66  Pulse: 81 93 103 88  Temp: 97.6 F (36.4 C) 98.3 F (36.8 C) 98.9 F (37.2 C) 99.1 F (37.3 C)  TempSrc: Oral Oral Oral Oral  Resp: 20 20 16 18   Height:      Weight:  SpO2: 100% 99% 100% 98%   General: Awake alert and oriented x3 no acute Cardiovascular: Regular rate and rhythm with positive S1-S2 no murmurs rubs gallops Respiratory: Good air movement clear to auscultation  Discharge Instructions  Discharge Orders    Future Orders Please Complete By Expires   Diet - low sodium heart healthy      Increase activity slowly        Medication List  As of 06/08/2012  8:53 AM   TAKE these medications         insulin glargine 100 UNIT/ML injection   Commonly known as: LANTUS   Inject 30  Units into the skin 2 (two) times daily.      insulin regular 100 units/mL injection   Commonly known as: NOVOLIN R,HUMULIN R   Inject 10-25 Units into the skin 3 (three) times daily before meals. Sliding scale      labetalol 200 MG tablet   Commonly known as: NORMODYNE   Take 1 tablet (200 mg total) by mouth 2 (two) times daily.      levofloxacin 750 MG tablet   Commonly known as: LEVAQUIN   Take 1 tablet (750 mg total) by mouth daily before lunch.      lisinopril 10 MG tablet   Commonly known as: PRINIVIL,ZESTRIL   Take 1 tablet (10 mg total) by mouth daily.              The results of significant diagnostics from this hospitalization (including imaging, microbiology, ancillary and laboratory) are listed below for reference.    Significant Diagnostic Studies: Dg Chest 2 View  06/05/2012  *RADIOLOGY REPORT*  Clinical Data: Shortness of breath.  High blood pressure.  Rule out pneumonia.  CHEST - 2 VIEW  Comparison: 06/04/2012  Findings: Lateral view degraded by patient arm position.  Midline trachea.  Normal heart size and mediastinal contours. No pleural effusion or pneumothorax.  Clear lungs.  IMPRESSION: No acute cardiopulmonary disease.  Original Report Authenticated By: Consuello Bossier, M.D.   Dg Chest 2 View  06/04/2012  *RADIOLOGY REPORT*  Clinical Data: Shortness of breath.  CHEST - 2 VIEW  Comparison: 08/25/2011.  Findings: Trachea is midline.  Heart size normal.  Lungs are clear. No pleural fluid.  IMPRESSION: No acute findings.  Original Report Authenticated By: Reyes Ivan, M.D.   Dg Abd 1 View  06/05/2012  *RADIOLOGY REPORT*  Clinical Data: Left lower quadrant pain  ABDOMEN - 1 VIEW  Comparison: None  Findings: Scattered gas and stool throughout colon. Additional air filled loop of bowel in the right mid abdomen, question large versus small bowel, potentially an air filled prominent sigmoid loop. No definite bowel wall thickening. Stool present rectum. Bones  unremarkable. No urinary tract calcification.  IMPRESSION: Nonobstructive bowel gas pattern.  Original Report Authenticated By: Lollie Marrow, M.D.    Microbiology: Recent Results (from the past 240 hour(s))  CULTURE, BLOOD (ROUTINE X 2)     Status: Normal (Preliminary result)   Collection Time   06/04/12  3:10 PM      Component Value Range Status Comment   Specimen Description BLOOD ARM RIGHT   Final    Special Requests BOTTLES DRAWN AEROBIC AND ANAEROBIC 10CC   Final    Culture  Setup Time [id number]   Final    Culture     Final    Value:        BLOOD CULTURE RECEIVED NO GROWTH TO DATE CULTURE WILL BE HELD FOR  5 DAYS BEFORE ISSUING A FINAL NEGATIVE REPORT   Report Status PENDING   Incomplete   CULTURE, BLOOD (ROUTINE X 2)     Status: Normal (Preliminary result)   Collection Time   06/04/12  3:25 PM      Component Value Range Status Comment   Specimen Description BLOOD HAND RIGHT   Final    Special Requests BOTTLES DRAWN AEROBIC ONLY 10CC   Final    Culture  Setup Time [id number]   Final    Culture     Final    Value:        BLOOD CULTURE RECEIVED NO GROWTH TO DATE CULTURE WILL BE HELD FOR 5 DAYS BEFORE ISSUING A FINAL NEGATIVE REPORT   Report Status PENDING   Incomplete   MRSA PCR SCREENING     Status: Normal   Collection Time   06/04/12  4:08 PM      Component Value Range Status Comment   MRSA by PCR NEGATIVE  NEGATIVE Final   URINE CULTURE     Status: Normal   Collection Time   06/05/12  1:15 AM      Component Value Range Status Comment   Specimen Description URINE, CLEAN CATCH   Final    Special Requests NONE   Final    Culture  Setup Time [id number]   Final    Colony Count >=100,000 COLONIES/ML   Final    Culture NMS   Final    Report Status 06/06/2012 FINAL   Final   URINE CULTURE     Status: Normal   Collection Time   06/05/12  1:45 PM      Component Value Range Status Comment   Specimen Description URINE, CLEAN CATCH   Final    Special Requests NONE   Final     Culture  Setup Time [id number]   Final    Colony Count >=100,000 COLONIES/ML   Final    Culture     Final    Value: Multiple bacterial morphotypes present, none predominant. Suggest appropriate recollection if clinically indicated.   Report Status 06/06/2012 FINAL   Final      Labs: Basic Metabolic Panel:  Lab 06/08/12 6295 06/08/12 0152 06/07/12 2139 06/07/12 1721 06/07/12 1414 06/07/12 0620 06/05/12 1131  NA 138 140 138 139 138 -- --  K 3.2* 3.2* -- -- -- -- --  CL 109 110 108 110 109 -- --  CO2 19 19 18* 17* 16* -- --  GLUCOSE 152* 143* 189* 199* 243* -- --  BUN 9 9 9 8 6  -- --  CREATININE 0.84 0.77 0.92 0.87 0.82 -- --  CALCIUM 8.8 8.3* 8.4 8.6 8.4 -- --  MG -- -- -- -- -- 1.7 1.8  PHOS -- -- -- -- -- -- --   Liver Function Tests:  Lab 06/06/12 0319 06/05/12 1131  AST 11 11  ALT <5 <5  ALKPHOS 61 68  BILITOT 0.4 0.4  PROT 6.5 7.4  ALBUMIN 2.7* 3.2*   No results found for this basename: LIPASE:5,AMYLASE:5 in the last 168 hours No results found for this basename: AMMONIA:5 in the last 168 hours CBC:  Lab 06/06/12 0319 06/05/12 1131 06/04/12 1653 06/04/12 1146  WBC 7.6 9.9 14.6* 14.4*  NEUTROABS -- 5.1 -- 10.8*  HGB 12.1 13.2 14.6 15.3*  HCT 35.4* 38.1 41.7 46.9*  MCV 83.1 83.0 84.4 88.0  PLT 244 297 306 322   Cardiac Enzymes:  Lab 06/05/12 0732 06/05/12 0015 06/04/12  1653  CKTOTAL 47 49 56  CKMB 1.2 1.5 1.4  CKMBINDEX -- -- --  TROPONINI <0.30 <0.30 <0.30   BNP: No components found with this basename: POCBNP:5 CBG:  Lab 06/08/12 0807 06/08/12 0704 06/08/12 0600 06/08/12 0453 06/08/12 0350  GLUCAP 203* 137* 169* 132* 129*    Time coordinating discharge: Greater than 35 minutes.  Signed:  Marinda Elk  [facility] 06/08/2012, 8:53 AM

## 2012-06-08 NOTE — Progress Notes (Signed)
Rebecca Rhodes to be D/C'd Home per MD order.  Discussed with the patient the After Visit Summary and all questions fully answered. All IV's discontinued with no bleeding noted. All belongings returned to patient for patient to take home. Patient provided with 3 day supply of lisinopril, levaquin, and labetalol. Also given a vial of lantus and novolog. follow up with [facility].   Susann Givens, RN, [facility] 06/08/2012 11:52 AM

## 2012-06-10 LAB — CULTURE, BLOOD (ROUTINE X 2): Culture: NO GROWTH

## 2012-06-11 NOTE — ED Provider Notes (Signed)
Medical screening examination/treatment/procedure(s) were conducted as a shared visit with non-physician practitioner(s) and myself.  I personally evaluated the patient during the encounter.  CRITICAL CARE Performed by: Raeford Razor  Patient generalized weakness, fatigue and shortness of breath. Workup significant for diabetic ketoacidosis. Plan IV fluids, insulin. Will require admission for further workup and evaluation. Suspect related to noncompliance with her medications. Consider infectious etiology as precipitating event but feel less likely. On exam tired appearing and with kussmaul respirations. Mildy tachy w/o murmur. Lungs clear.  Total critical care time: 35 minutes  Critical care time was exclusive of separately billable procedures and treating other patients.  Critical care was necessary to treat or prevent imminent or life-threatening deterioration.  Critical care was time spent personally by me on the following activities: development of treatment plan with patient and/or surrogate as well as nursing, discussions with consultants, evaluation of patient's response to treatment, examination of patient, obtaining history from patient or surrogate, ordering and performing treatments and interventions, ordering and review of laboratory studies, ordering and review of radiographic studies, pulse oximetry and re-evaluation of patient's condition.  Raeford Razor, MD 06/11/12 [phone]

## 2012-06-15 ENCOUNTER — Emergency Department (HOSPITAL_COMMUNITY)
Admission: EM | Admit: 2012-06-15 | Discharge: 2012-06-15 | Disposition: A | Discharge status: Home / Self Care | Payer: Self-pay | Attending: Emergency Medicine | Admitting: Emergency Medicine

## 2012-06-15 DIAGNOSIS — Z794 Long term (current) use of insulin: Secondary | ICD-10-CM | POA: Insufficient documentation

## 2012-06-15 DIAGNOSIS — I10 Essential (primary) hypertension: Secondary | ICD-10-CM | POA: Insufficient documentation

## 2012-06-15 DIAGNOSIS — H538 Other visual disturbances: Secondary | ICD-10-CM | POA: Insufficient documentation

## 2012-06-15 DIAGNOSIS — E119 Type 2 diabetes mellitus without complications: Secondary | ICD-10-CM | POA: Insufficient documentation

## 2012-06-15 DIAGNOSIS — E785 Hyperlipidemia, unspecified: Secondary | ICD-10-CM | POA: Insufficient documentation

## 2012-06-15 NOTE — ED Notes (Signed)
Gave patient peanut butter crackers and ginger-ale

## 2012-06-15 NOTE — ED Provider Notes (Addendum)
History   This chart was scribed for Rebecca B. Bernette Mayers, MD by Rebecca Rhodes. The patient was seen in room TR09C/TR09C. Patient's care was started at 1554.  CSN: [id number]  Arrival date & time 06/15/12  1554   First MD Initiated Contact with Patient 06/15/12 1746      Chief Complaint  Patient presents with  . Blurred Vision   HPI  Rebecca Rhodes is a 31 y.o. female who presents to the Emergency Department complaining of sudden onset moderate blurred vision onset a 3 days ago. Pt report that she was discharged a few several ago for DKA related symptoms and since her discharge she was had increasingly blurred vision. Pt denies any pain, does not wear glasses or contacts. Has noticed some lines in her peripheral vision. No particular provoking or relieving factors.   Past Medical History  Diagnosis Date  . Diabetes mellitus   . Hypertension   . HTN (hypertension) 06/04/2012  . Hyperlipidemia 06/04/2012  . Diabetes mellitus 06/04/2012    No past surgical history on file.  No family history on file.  History  Substance Use Topics  . Smoking status: Never Smoker   . Smokeless tobacco: Not on file  . Alcohol Use: No   Review of Systems  Constitutional: Negative for fever.  HENT: Negative for rhinorrhea.   Eyes: Positive for visual disturbance (blurred). Negative for pain.  Respiratory: Negative for cough and shortness of breath.   Cardiovascular: Negative for chest pain.  Gastrointestinal: Negative for nausea, vomiting, abdominal pain and diarrhea.  Genitourinary: Negative for dysuria.  Musculoskeletal: Negative for back pain.  Skin: Negative for rash.  Neurological: Negative for weakness and headaches.  All other systems reviewed and are negative.    Allergies  Review of patient's allergies indicates no known allergies.  Home Medications   Current Outpatient Rx  Name Route Sig Dispense Refill  . INSULIN GLARGINE 100 UNIT/ML [name] SOLN Subcutaneous Inject 30 Units into the  skin 2 (two) times daily.    . INSULIN REGULAR HUMAN 100 UNIT/ML IJ SOLN Subcutaneous Inject 10-25 Units into the skin 3 (three) times daily before meals. Per sliding scale.    Marland Kitchen LABETALOL HCL 200 MG PO TABS Oral Take 200 mg by mouth 2 (two) times daily.    Marland Kitchen LEVOFLOXACIN 750 MG PO TABS Oral Take 750 mg by mouth daily.    Marland Kitchen LISINOPRIL 10 MG PO TABS Oral Take 10 mg by mouth daily.      BP 147/107  Pulse 105  Temp 99.1 F (37.3 C) (Oral)  Resp 20  SpO2 100%  LMP 05/13/2012  Physical Exam  Nursing note and vitals reviewed. Constitutional: She is oriented to person, place, and time. She appears well-developed and well-nourished.  HENT:  Head: Normocephalic and atraumatic.       Limited exam due to undiluted pupil. Visualized fundus was normal. Anterior chamber is clear  Eyes: EOM are normal. Pupils are equal, round, and reactive to light.  Neck: Normal range of motion. Neck supple.  Cardiovascular: Normal rate, normal heart sounds and intact distal pulses.   Pulmonary/Chest: Effort normal and breath sounds normal.  Abdominal: Bowel sounds are normal. She exhibits no distension. There is no tenderness.  Musculoskeletal: Normal range of motion. She exhibits no edema and no tenderness.  Neurological: She is alert and oriented to person, place, and time. She has normal strength. No cranial nerve deficit or sensory deficit.  Skin: Skin is warm and dry. No rash noted.  Psychiatric:  She has a normal mood and affect.    ED Course  Procedures (including critical care time) DIAGNOSTIC STUDIES: Oxygen Saturation is 100% on room air, normal by my interpretation.    COORDINATION OF CARE: 1808- Evaluated Pt. Unable to dilate pupils.    Labs Reviewed  GLUCOSE, CAPILLARY   No results found.   1. Blurry vision       MDM  Blood sugar is WNL now, but recently admitted for DKA, suspect her visual problems are diabetes related. Advised she would need Ophtho followup for detailed  fundoscopic exam and followup. Awaiting visual acuities.    I personally performed the services described in the documentation, which were scribed in my presence. The recorded information has been reviewed and considered.      Rebecca B. Bernette Mayers, MD 06/15/12 1853  Rebecca Levan. Bernette Mayers, MD 06/15/12 1902

## 2012-06-15 NOTE — ED Notes (Signed)
Patient to ED with C/O blurred vision. States that she see black lines in her periphery. Denies any pain. Patient states that this has been ongoing for 3 days.

## 2012-06-15 NOTE — ED Notes (Signed)
Blurred vision -started 3 days ago.hx. Of hypertension.

## 2012-06-15 NOTE — ED Notes (Signed)
No change in medication

## 2012-10-24 ENCOUNTER — Encounter (HOSPITAL_COMMUNITY): Payer: Self-pay

## 2012-10-24 ENCOUNTER — Inpatient Hospital Stay (HOSPITAL_COMMUNITY): Payer: Self-pay

## 2012-10-24 ENCOUNTER — Inpatient Hospital Stay (HOSPITAL_COMMUNITY)
Admission: EM | Admit: 2012-10-24 | Discharge: 2012-11-06 | DRG: 637 | Disposition: A | Discharge status: Home Health | Payer: MEDICAID | Attending: Internal Medicine | Admitting: Internal Medicine

## 2012-10-24 ENCOUNTER — Emergency Department (HOSPITAL_COMMUNITY): Payer: Self-pay

## 2012-10-24 DIAGNOSIS — I10 Essential (primary) hypertension: Secondary | ICD-10-CM | POA: Diagnosis present

## 2012-10-24 DIAGNOSIS — T368X5A Adverse effect of other systemic antibiotics, initial encounter: Secondary | ICD-10-CM | POA: Diagnosis not present

## 2012-10-24 DIAGNOSIS — E1165 Type 2 diabetes mellitus with hyperglycemia: Secondary | ICD-10-CM | POA: Diagnosis present

## 2012-10-24 DIAGNOSIS — L02419 Cutaneous abscess of limb, unspecified: Secondary | ICD-10-CM | POA: Diagnosis present

## 2012-10-24 DIAGNOSIS — Z6834 Body mass index (BMI) 34.0-34.9, adult: Secondary | ICD-10-CM

## 2012-10-24 DIAGNOSIS — D72829 Elevated white blood cell count, unspecified: Secondary | ICD-10-CM | POA: Diagnosis present

## 2012-10-24 DIAGNOSIS — D638 Anemia in other chronic diseases classified elsewhere: Secondary | ICD-10-CM | POA: Diagnosis present

## 2012-10-24 DIAGNOSIS — N179 Acute kidney failure, unspecified: Secondary | ICD-10-CM | POA: Diagnosis present

## 2012-10-24 DIAGNOSIS — E1110 Type 2 diabetes mellitus with ketoacidosis without coma: Secondary | ICD-10-CM | POA: Diagnosis present

## 2012-10-24 DIAGNOSIS — IMO0002 Reserved for concepts with insufficient information to code with codable children: Secondary | ICD-10-CM | POA: Diagnosis present

## 2012-10-24 DIAGNOSIS — E119 Type 2 diabetes mellitus without complications: Secondary | ICD-10-CM

## 2012-10-24 DIAGNOSIS — E860 Dehydration: Secondary | ICD-10-CM | POA: Diagnosis present

## 2012-10-24 DIAGNOSIS — R109 Unspecified abdominal pain: Secondary | ICD-10-CM

## 2012-10-24 DIAGNOSIS — E876 Hypokalemia: Secondary | ICD-10-CM

## 2012-10-24 DIAGNOSIS — J9600 Acute respiratory failure, unspecified whether with hypoxia or hypercapnia: Secondary | ICD-10-CM | POA: Diagnosis present

## 2012-10-24 DIAGNOSIS — G9341 Metabolic encephalopathy: Secondary | ICD-10-CM | POA: Diagnosis present

## 2012-10-24 DIAGNOSIS — R634 Abnormal weight loss: Secondary | ICD-10-CM | POA: Diagnosis present

## 2012-10-24 DIAGNOSIS — R0989 Other specified symptoms and signs involving the circulatory and respiratory systems: Secondary | ICD-10-CM

## 2012-10-24 DIAGNOSIS — E785 Hyperlipidemia, unspecified: Secondary | ICD-10-CM | POA: Diagnosis present

## 2012-10-24 DIAGNOSIS — E1310 Other specified diabetes mellitus with ketoacidosis without coma: Principal | ICD-10-CM | POA: Diagnosis present

## 2012-10-24 DIAGNOSIS — L0231 Cutaneous abscess of buttock: Secondary | ICD-10-CM | POA: Diagnosis present

## 2012-10-24 DIAGNOSIS — R0609 Other forms of dyspnea: Secondary | ICD-10-CM

## 2012-10-24 DIAGNOSIS — R0600 Dyspnea, unspecified: Secondary | ICD-10-CM

## 2012-10-24 DIAGNOSIS — L270 Generalized skin eruption due to drugs and medicaments taken internally: Secondary | ICD-10-CM | POA: Diagnosis not present

## 2012-10-24 DIAGNOSIS — Y9210 Unspecified residential institution as the place of occurrence of the external cause: Secondary | ICD-10-CM | POA: Diagnosis not present

## 2012-10-24 LAB — URINALYSIS, ROUTINE W REFLEX MICROSCOPIC
Glucose, UA: >1000 mg/dL — AB
Ketones, ur: >80 mg/dL — AB
Leukocytes, UA: NEGATIVE
Protein, ur: 30 mg/dL — AB

## 2012-10-24 LAB — BASIC METABOLIC PANEL
BUN: 13 mg/dL (ref 6–23)
BUN: 16 mg/dL (ref 6–23)
BUN: 16 mg/dL (ref 6–23)
BUN: 15 mg/dL (ref 6–23)
BUN: 14 mg/dL (ref 6–23)
BUN: 14 mg/dL (ref 6–23)
CO2: <7 mEq/L — CL (ref 19–32)
CO2: <7 mEq/L — CL (ref 19–32)
CO2: <7 mEq/L — CL (ref 19–32)
CO2: 9 mEq/L — CL (ref 19–32)
Calcium: 8.5 mg/dL (ref 8.4–10.5)
Calcium: 8.5 mg/dL (ref 8.4–10.5)
Calcium: 8.6 mg/dL (ref 8.4–10.5)
Chloride: 105 mEq/L (ref 96–112)
Chloride: 105 mEq/L (ref 96–112)
Chloride: 111 mEq/L (ref 96–112)
Creatinine, Ser: 1.53 mg/dL — ABNORMAL HIGH (ref 0.50–1.10)
Creatinine, Ser: 1.55 mg/dL — ABNORMAL HIGH (ref 0.50–1.10)
Creatinine, Ser: 1.43 mg/dL — ABNORMAL HIGH (ref 0.50–1.10)
Creatinine, Ser: 1.36 mg/dL — ABNORMAL HIGH (ref 0.50–1.10)
Creatinine, Ser: 1.28 mg/dL — ABNORMAL HIGH (ref 0.50–1.10)
GFR calc Af Amer: 63 mL/min — ABNORMAL LOW (ref >90)
GFR calc non Af Amer: 48 mL/min — ABNORMAL LOW (ref >90)
GFR calc non Af Amer: 51 mL/min — ABNORMAL LOW (ref >90)
Glucose, Bld: 445 mg/dL — ABNORMAL HIGH (ref 70–99)
Glucose, Bld: 468 mg/dL — ABNORMAL HIGH (ref 70–99)
Glucose, Bld: 412 mg/dL — ABNORMAL HIGH (ref 70–99)
Glucose, Bld: 350 mg/dL — ABNORMAL HIGH (ref 70–99)
Glucose, Bld: 279 mg/dL — ABNORMAL HIGH (ref 70–99)
Potassium: 4 mEq/L (ref 3.5–5.1)
Potassium: 4.5 mEq/L (ref 3.5–5.1)
Potassium: 3 mEq/L — ABNORMAL LOW (ref 3.5–5.1)
Sodium: 137 mEq/L (ref 135–145)
Sodium: 139 mEq/L (ref 135–145)

## 2012-10-24 LAB — CBC WITH DIFFERENTIAL/PLATELET
Basophils Relative: 0 % (ref 0–1)
Eosinophils Absolute: 0 10*3/uL (ref 0.0–0.7)
HCT: 42 % (ref 36.0–46.0)
Hemoglobin: 13.9 g/dL (ref 12.0–15.0)
Lymphocytes Relative: 12 % (ref 12–46)
Lymphs Abs: 3.4 10*3/uL (ref 0.7–4.0)
MCHC: 33.1 g/dL (ref 30.0–36.0)
MCV: 91.3 fL (ref 78.0–100.0)
Monocytes Relative: 12 % (ref 3–12)
Neutro Abs: 21.3 10*3/uL — ABNORMAL HIGH (ref 1.7–7.7)

## 2012-10-24 LAB — POCT I-STAT, CHEM 8
Creatinine, Ser: 1.4 mg/dL — ABNORMAL HIGH (ref 0.50–1.10)
HCT: 47 % — ABNORMAL HIGH (ref 36.0–46.0)
Hemoglobin: 16 g/dL — ABNORMAL HIGH (ref 12.0–15.0)
Sodium: 138 mEq/L (ref 135–145)
TCO2: 7 mmol/L (ref 0–100)

## 2012-10-24 LAB — HEPATIC FUNCTION PANEL
ALT: <5 U/L (ref 0–35)
AST: 11 U/L (ref 0–37)
Albumin: 3 g/dL — ABNORMAL LOW (ref 3.5–5.2)
Alkaline Phosphatase: 113 U/L (ref 39–117)
Bilirubin, Direct: <0.1 mg/dL (ref 0.0–0.3)
Total Bilirubin: 0.2 mg/dL — ABNORMAL LOW (ref 0.3–1.2)

## 2012-10-24 LAB — POCT I-STAT 3, ART BLOOD GAS (G3+)
Acid-base deficit: 25 mmol/L — ABNORMAL HIGH (ref 0.0–2.0)
Bicarbonate: 5.9 mEq/L — ABNORMAL LOW (ref 20.0–24.0)
O2 Saturation: 100 %
O2 Saturation: 100 %
O2 Saturation: 100 %
TCO2: 7 mmol/L (ref 0–100)
TCO2: 6 mmol/L (ref 0–100)
TCO2: 7 mmol/L (ref 0–100)
pCO2 arterial: 14.5 mmHg — CL (ref 35.0–45.0)
pCO2 arterial: 25.4 mmHg — ABNORMAL LOW (ref 35.0–45.0)
pCO2 arterial: 20.2 mmHg — ABNORMAL LOW (ref 35.0–45.0)
pH, Arterial: 6.945 — CL (ref 7.350–7.450)
pH, Arterial: 7.175 — CL (ref 7.350–7.450)
pO2, Arterial: 297 mmHg — ABNORMAL HIGH (ref 80.0–100.0)

## 2012-10-24 LAB — GLUCOSE, CAPILLARY
Glucose-Capillary: 405 mg/dL — ABNORMAL HIGH (ref 70–99)
Glucose-Capillary: 329 mg/dL — ABNORMAL HIGH (ref 70–99)
Glucose-Capillary: 300 mg/dL — ABNORMAL HIGH (ref 70–99)
Glucose-Capillary: 294 mg/dL — ABNORMAL HIGH (ref 70–99)
Glucose-Capillary: 246 mg/dL — ABNORMAL HIGH (ref 70–99)
Glucose-Capillary: 246 mg/dL — ABNORMAL HIGH (ref 70–99)

## 2012-10-24 LAB — TROPONIN I
Troponin I: <0.3 ng/mL (ref <0.30)
Troponin I: <0.3 ng/mL (ref <0.30)

## 2012-10-24 LAB — LACTIC ACID, PLASMA: Lactic Acid, Venous: 2.2 mmol/L (ref 0.5–2.2)

## 2012-10-24 LAB — URINE MICROSCOPIC-ADD ON

## 2012-10-24 LAB — KETONES, QUALITATIVE

## 2012-10-24 LAB — MRSA PCR SCREENING: MRSA by PCR: NEGATIVE

## 2012-10-24 MED ORDER — DEXTROSE-NACL 5-0.45 % IV SOLN
INTRAVENOUS | Status: DC
  Administered 2012-10-24 – 2012-10-25 (×3): 1000 mL via INTRAVENOUS
  Administered 2012-10-25 – 2012-10-26 (×2): via INTRAVENOUS

## 2012-10-24 MED ORDER — SUCCINYLCHOLINE CHLORIDE 20 MG/ML IJ SOLN
INTRAMUSCULAR | Status: AC
  Filled 2012-10-24: qty 1

## 2012-10-24 MED ORDER — MIDAZOLAM HCL 5 MG/5ML IJ SOLN
INTRAMUSCULAR | Status: AC | PRN
  Administered 2012-10-24: 2 mg via INTRAVENOUS

## 2012-10-24 MED ORDER — LABETALOL HCL 100 MG PO TABS
100.0000 mg | ORAL_TABLET | Freq: Two times a day (BID) | ORAL | Status: DC
  Administered 2012-10-24 – 2012-10-25 (×3): 100 mg
  Filled 2012-10-24 (×9): qty 1

## 2012-10-24 MED ORDER — FENTANYL CITRATE 0.05 MG/ML IJ SOLN
200.0000 ug | Freq: Once | INTRAMUSCULAR | Status: DC
  Filled 2012-10-24 (×4): qty 2

## 2012-10-24 MED ORDER — SODIUM CHLORIDE 0.9 % IV SOLN
INTRAVENOUS | Status: DC
  Administered 2012-10-24: 18.1 [IU]/h via INTRAVENOUS
  Administered 2012-10-24: 16.1 [IU]/h via INTRAVENOUS
  Filled 2012-10-24 (×4): qty 1

## 2012-10-24 MED ORDER — SODIUM BICARBONATE 8.4 % IV SOLN: 50.0000 meq | Freq: Once | INTRAVENOUS | Status: DC

## 2012-10-24 MED ORDER — SODIUM CHLORIDE 0.9 % IV SOLN
INTRAVENOUS | Status: DC
  Administered 2012-10-24 (×2): via INTRAVENOUS

## 2012-10-24 MED ORDER — POTASSIUM CHLORIDE 20 MEQ/15ML (10%) PO LIQD
40.0000 meq | Freq: Once | ORAL | Status: AC
  Administered 2012-10-24: 40 meq
  Filled 2012-10-24: qty 30

## 2012-10-24 MED ORDER — SODIUM CHLORIDE 0.9 % IV SOLN: Freq: Once | INTRAVENOUS | Status: AC

## 2012-10-24 MED ORDER — CHLORHEXIDINE GLUCONATE 0.12 % MT SOLN
15.0000 mL | Freq: Two times a day (BID) | OROMUCOSAL | Status: DC
  Administered 2012-10-24 – 2012-11-04 (×19): 15 mL via OROMUCOSAL
  Filled 2012-10-24 (×15): qty 15

## 2012-10-24 MED ORDER — ETOMIDATE 2 MG/ML IV SOLN
INTRAVENOUS | Status: AC
  Filled 2012-10-24: qty 20

## 2012-10-24 MED ORDER — PROPOFOL 10 MG/ML IV EMUL
5.0000 ug/kg/min | INTRAVENOUS | Status: DC
  Administered 2012-10-24: 45 ug/kg/min via INTRAVENOUS
  Administered 2012-10-24 – 2012-10-25 (×3): 40 ug/kg/min via INTRAVENOUS
  Filled 2012-10-24 (×7): qty 100

## 2012-10-24 MED ORDER — PANTOPRAZOLE SODIUM 40 MG IV SOLR
40.0000 mg | INTRAVENOUS | Status: DC
  Administered 2012-10-24 – 2012-10-25 (×2): 40 mg via INTRAVENOUS
  Filled 2012-10-24 (×3): qty 40

## 2012-10-24 MED ORDER — SODIUM CHLORIDE 0.9 % IV SOLN: INTRAVENOUS | Status: AC

## 2012-10-24 MED ORDER — LIDOCAINE HCL (CARDIAC) 20 MG/ML IV SOLN
INTRAVENOUS | Status: AC
  Filled 2012-10-24: qty 5

## 2012-10-24 MED ORDER — VANCOMYCIN HCL 1000 MG IV SOLR
1250.0000 mg | Freq: Two times a day (BID) | INTRAVENOUS | Status: DC
  Administered 2012-10-24 – 2012-10-25 (×4): 1250 mg via INTRAVENOUS
  Filled 2012-10-24 (×5): qty 1250

## 2012-10-24 MED ORDER — PROPOFOL 10 MG/ML IV EMUL
INTRAVENOUS | Status: AC
  Administered 2012-10-24: 1000 mg
  Filled 2012-10-24: qty 100

## 2012-10-24 MED ORDER — FENTANYL CITRATE 0.05 MG/ML IJ SOLN
INTRAMUSCULAR | Status: AC | PRN
  Administered 2012-10-24: 100 ug via INTRAVENOUS

## 2012-10-24 MED ORDER — MIDAZOLAM HCL 2 MG/2ML IJ SOLN: 4.0000 mg | Freq: Once | INTRAMUSCULAR | Status: DC

## 2012-10-24 MED ORDER — HEPARIN SODIUM (PORCINE) 5000 UNIT/ML IJ SOLN
5000.0000 [IU] | Freq: Three times a day (TID) | INTRAMUSCULAR | Status: DC
  Administered 2012-10-24 – 2012-10-26 (×6): 5000 [IU] via SUBCUTANEOUS
  Filled 2012-10-24 (×10): qty 1

## 2012-10-24 MED ORDER — DEXTROSE 5 % IV SOLN
INTRAVENOUS | Status: DC
  Administered 2012-10-24 – 2012-10-25 (×2): via INTRAVENOUS
  Filled 2012-10-24 (×5): qty 150

## 2012-10-24 MED ORDER — MIDAZOLAM HCL 2 MG/2ML IJ SOLN
INTRAMUSCULAR | Status: AC
  Filled 2012-10-24: qty 4

## 2012-10-24 MED ORDER — LABETALOL HCL 5 MG/ML IV SOLN
INTRAVENOUS | Status: AC
  Filled 2012-10-24: qty 4

## 2012-10-24 MED ORDER — SODIUM CHLORIDE 0.9 % IV BOLUS (SEPSIS)
1000.0000 mL | Freq: Once | INTRAVENOUS | Status: AC
  Administered 2012-10-24: 1000 mL via INTRAVENOUS

## 2012-10-24 MED ORDER — PIPERACILLIN-TAZOBACTAM 3.375 G IVPB
3.3750 g | Freq: Three times a day (TID) | INTRAVENOUS | Status: DC
  Administered 2012-10-24 – 2012-10-26 (×6): 3.375 g via INTRAVENOUS
  Filled 2012-10-24 (×8): qty 50

## 2012-10-24 MED ORDER — FENTANYL CITRATE 0.05 MG/ML IJ SOLN
50.0000 ug | INTRAMUSCULAR | Status: DC | PRN
  Administered 2012-10-24: 50 ug via INTRAVENOUS
  Administered 2012-10-24 – 2012-10-25 (×6): 100 ug via INTRAVENOUS
  Administered 2012-10-27: 50 ug via INTRAVENOUS
  Administered 2012-10-27 – 2012-11-03 (×21): 100 ug via INTRAVENOUS
  Filled 2012-10-24 (×25): qty 2

## 2012-10-24 MED ORDER — ETOMIDATE 2 MG/ML IV SOLN
INTRAVENOUS | Status: AC | PRN
  Administered 2012-10-24: 20 mg via INTRAVENOUS

## 2012-10-24 MED ORDER — POTASSIUM CHLORIDE 10 MEQ/50ML IV SOLN
INTRAVENOUS | Status: AC
  Filled 2012-10-24: qty 100

## 2012-10-24 MED ORDER — POTASSIUM CHLORIDE 10 MEQ/50ML IV SOLN
10.0000 meq | INTRAVENOUS | Status: AC
  Administered 2012-10-24 (×2): 10 meq via INTRAVENOUS

## 2012-10-24 MED ORDER — SODIUM BICARBONATE 8.4 % IV SOLN
INTRAVENOUS | Status: AC | PRN
  Administered 2012-10-24: 100 meq via INTRAVENOUS

## 2012-10-24 MED ORDER — PROPOFOL 10 MG/ML IV EMUL
5.0000 ug/kg/min | Freq: Once | INTRAVENOUS | Status: AC
  Administered 2012-10-24: 10 ug/kg/min via INTRAVENOUS

## 2012-10-24 MED ORDER — POTASSIUM CHLORIDE 10 MEQ/50ML IV SOLN
10.0000 meq | INTRAVENOUS | Status: AC
  Administered 2012-10-24 (×3): 10 meq via INTRAVENOUS
  Filled 2012-10-24: qty 150

## 2012-10-24 MED ORDER — LABETALOL HCL 5 MG/ML IV SOLN
10.0000 mg | INTRAVENOUS | Status: DC | PRN
  Filled 2012-10-24: qty 4

## 2012-10-24 MED ORDER — ROCURONIUM BROMIDE 50 MG/5ML IV SOLN
INTRAVENOUS | Status: AC | PRN
  Administered 2012-10-24: 50 mg via INTRAVENOUS

## 2012-10-24 MED ORDER — ROCURONIUM BROMIDE 50 MG/5ML IV SOLN
INTRAVENOUS | Status: AC
  Filled 2012-10-24: qty 2

## 2012-10-24 MED ORDER — DEXTROSE 50 % IV SOLN: 25.0000 mL | INTRAVENOUS | Status: DC | PRN

## 2012-10-24 MED ORDER — POTASSIUM CHLORIDE 10 MEQ/100ML IV SOLN: 10.0000 meq | INTRAVENOUS | Status: AC

## 2012-10-24 MED ORDER — BIOTENE DRY MOUTH MT LIQD
15.0000 mL | Freq: Four times a day (QID) | OROMUCOSAL | Status: DC
  Administered 2012-10-25 – 2012-11-05 (×30): 15 mL via OROMUCOSAL

## 2012-10-24 MED ORDER — SODIUM BICARBONATE 8.4 % IV SOLN
INTRAVENOUS | Status: AC
  Filled 2012-10-24: qty 50

## 2012-10-24 MED ORDER — FENTANYL CITRATE 0.05 MG/ML IJ SOLN
INTRAMUSCULAR | Status: AC
  Filled 2012-10-24: qty 4

## 2012-10-24 NOTE — Progress Notes (Signed)
eLink Physician-Brief Progress Note Patient Name: Rebecca Rhodes DOB: May 12, 1981 MRN: [id number]  Date of Service  10/24/2012   HPI/Events of Note     eICU Interventions  Hypokalemia -repleted    Intervention Category Intermediate Interventions: Electrolyte abnormality - evaluation and management  ALVA,RAKESH V. 10/24/2012, 9:13 PM

## 2012-10-24 NOTE — Procedures (Signed)
Intubation Procedure Note Rebecca Rhodes [id number] Nov 16, 1981  Procedure: Intubation Indications: Respiratory insufficiency  Procedure Details Consent: Unable to obtain consent because of emergent medical necessity. Time Out: Verified patient identification, verified procedure, site/side was marked, verified correct patient position, special equipment/implants available, medications/allergies/relevent history reviewed, required imaging and test results available.  Performed  Maximum sterile technique was used including antiseptics, cap, gloves, hand hygiene and mask.  3 - gluidescope     Evaluation Hemodynamic Status: BP stable throughout; O2 sats: stable throughout Patient's Current Condition: stable Complications: No apparent complications Patient did tolerate procedure well. Chest X-ray ordered to verify placement.  CXR: pending.    Patient seen and examined, agree with above note.  I dictated the care and orders written for this patient under my direction.  Koren Bound, M.D. [phone]

## 2012-10-24 NOTE — ED Notes (Signed)
Following meds given by MD in room; Versed 2mg  IV Bicarb 1amp Fentanyl IV

## 2012-10-24 NOTE — Progress Notes (Signed)
CRITICAL VALUE ALERT  Critical value received:  CO < 7   Date of notification:  10/24/12  Time of notification:  1725  Critical value read back:yes  Nurse who received alert:  Kandace Parkins, RN  MD notified (1st page):  Vassie Loll, MD  Time of first page:  1735  MD notified (2nd page):  Time of second page:  Responding MD:  Vassie Loll, MD  Time MD responded:  [phone]

## 2012-10-24 NOTE — ED Notes (Signed)
CBG 405 

## 2012-10-24 NOTE — Progress Notes (Signed)
Utilization review completed. P.J. Gena Laski,RN,BSN case manager [phone]   

## 2012-10-24 NOTE — Procedures (Signed)
Arterial Catheter Insertion Procedure Note Rebecca Rhodes [id number] 05-11-1981  Procedure: Insertion of Arterial Catheter  Indications: Blood pressure monitoring and Frequent blood sampling  Procedure Details Consent: Unable to obtain consent because of altered level of consciousness. Time Out: Verified patient identification, verified procedure, site/side was marked, verified correct patient position, special equipment/implants available, medications/allergies/relevent history reviewed, required imaging and test results available.  Performed  Maximum sterile technique was used including antiseptics, gloves, hand hygiene, mask and sheet. Skin prep: Chlorhexidine; local anesthetic administered 20 gauge catheter was inserted into right radial artery using the Seldinger technique.  Evaluation Blood flow good; BP tracing good. Complications: No apparent complications.   [name] 10/24/2012  Patient seen and examined, agree with above note.  I dictated the care and orders written for this patient under my direction.  Koren Bound, M.D. [phone]

## 2012-10-24 NOTE — ED Notes (Signed)
MDs x 2 at bedside placing central Line

## 2012-10-24 NOTE — ED Notes (Signed)
Foley cath inserted without difficulty. Clear yellow urine output

## 2012-10-24 NOTE — ED Notes (Signed)
NP and myself spoke with pt boyfriend and explained pt condition.  Taken in to see pt and given snack.

## 2012-10-24 NOTE — ED Notes (Signed)
EMS reports pt with SOB for the last 12 hours, CBG 322, BP 170/130

## 2012-10-24 NOTE — ED Notes (Signed)
Report called to [facility] On 2100

## 2012-10-24 NOTE — Progress Notes (Signed)
CRITICAL VALUE ALERT  Critical value received:  CO2 < 7  Date of notification:  10/24/12  Time of notification:  1530  Critical value read back:yes  Nurse who received alert:  Kandace Parkins, RN  MD notified (1st page):  1530  Time of first page:  Molli Knock, MD  MD notified (2nd page):  Time of second page:  Responding MD:  1530  Time MD responded:  Molli Knock, MD

## 2012-10-24 NOTE — ED Notes (Signed)
Critical result of CO2 less than 7 given to Dr. Bebe Shaggy

## 2012-10-24 NOTE — Progress Notes (Signed)
ANTIBIOTIC CONSULT NOTE - INITIAL  Pharmacy Consult for Vancomycin, Zosyn Indication: rule out sepsis  No Known Allergies  Patient Measurements: Weight: 222 lb 3.6 oz (100.8 kg)  Vital Signs: Temp: 98.5 F (36.9 C) (11/13 1219) Temp src: Oral (11/13 1219) BP: 181/98 mmHg (11/13 1100) Pulse Rate: 129  (11/13 1149) Intake/Output from previous day:   Intake/Output from this shift: Total I/O In: -  Out: 1500 [Urine:1500]  Labs:  [facility] 10/24/12 0822 10/24/12 0802  WBC -- 28.1*  HGB 16.0* 13.9  PLT -- 274  LABCREA -- --  CREATININE 1.40* 1.23*   The CrCl is unknown because both a height and weight (above a minimum accepted value) are required for this calculation. No results found for this basename: VANCOTROUGH:2,VANCOPEAK:2,VANCORANDOM:2,GENTTROUGH:2,GENTPEAK:2,GENTRANDOM:2,TOBRATROUGH:2,TOBRAPEAK:2,TOBRARND:2,AMIKACINPEAK:2,AMIKACINTROU:2,AMIKACIN:2, in the last 72 hours   Microbiology: Recent Results (from the past 720 hour(s))  MRSA PCR SCREENING     Status: Normal   Collection Time   10/24/12 11:52 AM      Component Value Range Status Comment   MRSA by PCR NEGATIVE  NEGATIVE Final     Medical History: Past Medical History  Diagnosis Date  . Diabetes mellitus   . Hypertension   . HTN (hypertension) 06/04/2012  . Hyperlipidemia 06/04/2012  . Diabetes mellitus 06/04/2012    Medications:  Prescriptions prior to admission  Medication Sig Dispense Refill  . insulin aspart protamine-insulin aspart (NOVOLOG 70/30) (70-30) 100 UNIT/ML injection Inject 20 Units into the skin daily with supper.      . labetalol (NORMODYNE) 200 MG tablet Take 200 mg by mouth 2 (two) times daily.      . insulin regular (NOVOLIN R,HUMULIN R) 100 units/mL injection Inject 10-25 Units into the skin 3 (three) times daily before meals. Per sliding scale.      Marland Kitchen levofloxacin (LEVAQUIN) 750 MG tablet Take 750 mg by mouth daily.      Marland Kitchen lisinopril (PRINIVIL,ZESTRIL) 10 MG tablet Take 10 mg by  mouth daily.       Assessment: Ms. Blackstock was admitted with AMS, resp distress and DKA. Received orders to start empiric, broad spectrum antibiotics- noted some suspicion of cellulitis. Pt is afebrile, but presented with significantly elevated WBC. Scr is elevated at baseline, suspect dehydration may contribute to this, but BUN:SCr is <20 (8). CrClr ~ 39ml/min. 11/13 Vanc>> 11/13 Zosyn>>  11/13: MRSA pcr: neg 11/13: Blood: 11/13: sputum: 11/13: Urine:  Goal of Therapy:  Vancomycin trough level 15-20 mcg/ml  Plan:  - Start Zosyn 3.375gm IV Q8h, each dose infused over 4 hours. - Vancomycin 1250mg  IV q 12h - Will monitor cx/spec/sens, renal fn and clinical status daily.  Thanks, Jakaiya Netherland K. Allena Katz, PharmD, BCPS.  Clinical Pharmacist Pager [phone]. 10/24/2012 2:36 PM

## 2012-10-24 NOTE — Progress Notes (Signed)
CRITICAL VALUE ALERT  Critical value received:  CO2 9  Date of notification:  10/24/12  Time of notification:  1840  Critical value read back:yes  Nurse who received alert:  Kandace Parkins, RN  MD notified (1st page):  Vassie Loll, MD  Time of first page:  1840  MD notified (2nd page):  Time of second page:  Responding MD:  Vassie Loll, MD  Time MD responded:  [phone]

## 2012-10-24 NOTE — ED Notes (Signed)
CPAP removed by RT.

## 2012-10-24 NOTE — Procedures (Signed)
Central Venous Catheter Insertion Procedure Note Audria Takeshita [id number] Sep 14, 1981  Procedure: Insertion of Central Venous Catheter Indications: Assessment of intravascular volume, Drug and/or fluid administration and Frequent blood sampling  Procedure Details Consent: Unable to obtain consent because of emergent medical necessity. Time Out: Verified patient identification, verified procedure, site/side was marked, verified correct patient position, special equipment/implants available, medications/allergies/relevent history reviewed, required imaging and test results available.  Performed  Maximum sterile technique was used including antiseptics, cap, gloves, gown, hand hygiene, mask and sheet. Skin prep: Chlorhexidine; local anesthetic administered A antimicrobial bonded/coated triple lumen catheter was placed in the left internal jugular vein using the Seldinger technique.  Evaluation Blood flow good Complications: No apparent complications Patient did tolerate procedure well. Chest X-ray ordered to verify placement.  CXR: pending.   Performed by Elenore Rota NP student  Performed using ultrasound guidance under direct MD supervision.   Patient seen and examined, agree with above note.  I dictated the care and orders written for this patient under my direction.  Koren Bound, M.D. [phone]

## 2012-10-24 NOTE — H&P (Signed)
PULMONARY  / CRITICAL CARE MEDICINE  Name: Rebecca Rhodes MRN: [id number] DOB: [birth year]    LOS: 0  REFERRING MD :  EDP   CHIEF COMPLAINT:  SOB and DKA  BRIEF PATIENT DESCRIPTION: Rebecca Rhodes is a 31 yo female who presented to [facility] via EMS on 11/13 with a chief complaint of shortness of breath.  She was found to be in DKA with a AG of 20.1.  LINES / TUBES: Left CVC IJ 11/13 >>> Foley 11/13 >>> oett 11/13>>>  CULTURES: Blood x2 11/13 >>> Urine 11/13 >>> Sputum 11/13 >>>  ANTIBIOTICS: Zosyn 11/13 >>> Vanc 11/13 >>>  SIGNIFICANT EVENTS:  11/13: Intubated  LEVEL OF CARE: ICU PRIMARY SERVICE:  PCCM CONSULTANTS:  Pharm CODE STATUS: Full DIET:  NPO DVT Px:  Heparin prophylaxis GI Px:  Protonix  HISTORY OF PRESENT ILLNESS: Rebecca Rhodes is a 31 yo female who presented to [facility] via EMS on 11/13 with a chief complaint of shortness of breath.  The patient was given albuterol in the field which marginally improved her SOB; however, on arrival to [facility] she was found to have a profound metabolic acidosis with a pH of 6.9, Bicarb of 3.1, CBG of 418, and AG of 20.9.  According to the patient she stopped taking her 70/30 insulin and blood pressure medicines almost three months ago because she was unable to afford going to her doctor.  Over the past few days she started to develop what she described as a "boil" on her left leg which she popped, but denies any other sick events.  Also of note was a Bann count of 28.1.  She complains of some nausea and a severe headache which started several days ago.  She denies any chest pain, vomiting or fever.  The patient was last admitted with DKA in June of this year with a chief complaint of abdominal pain.   The patient was placed on CPAP in the ER, given 2L of NS and an insulin drip was ordered.  PCCM was called for admission and further management.  PAST MEDICAL HISTORY :  Past Medical History  Diagnosis Date  . Diabetes mellitus   . Hypertension   .  HTN (hypertension) 06/04/2012  . Hyperlipidemia 06/04/2012  . Diabetes mellitus 06/04/2012   History reviewed. No pertinent past surgical history. Prior to Admission medications   Medication Sig Start Date End Date Taking? Authorizing Provider  insulin aspart protamine-insulin aspart (NOVOLOG 70/30) (70-30) 100 UNIT/ML injection Inject 20 Units into the skin daily with supper.   Yes Historical Provider, MD  labetalol (NORMODYNE) 200 MG tablet Take 200 mg by mouth 2 (two) times daily.   Yes Historical Provider, MD  insulin regular (NOVOLIN R,HUMULIN R) 100 units/mL injection Inject 10-25 Units into the skin 3 (three) times daily before meals. Per sliding scale.    Historical Provider, MD  levofloxacin (LEVAQUIN) 750 MG tablet Take 750 mg by mouth daily.    Historical Provider, MD  lisinopril (PRINIVIL,ZESTRIL) 10 MG tablet Take 10 mg by mouth daily.    Historical Provider, MD   No Known Allergies  FAMILY HISTORY:  History reviewed. No pertinent family history. SOCIAL HISTORY:  reports that she has never smoked. She does not have any smokeless tobacco history on file. She reports that she does not drink alcohol or use illicit drugs.  REVIEW OF SYSTEMS:  Unable to obtain full ROS r/t the patient's SOB, but pertinent items noted in the HPI.   INTERVAL HISTORY:  Now on  vent  VITAL SIGNS: Temp:  [98.2 F (36.8 C)] 98.2 F (36.8 C) (11/13 0751) Pulse Rate:  [111-122] 111  (11/13 1100) Resp:  [22-36] 22  (11/13 1100) BP: (171-262)/(98-144) 181/98 mmHg (11/13 1100) SpO2:  [100 %] 100 % (11/13 1100) FiO2 (%):  [50 %-100 %] 100 % (11/13 0950) Weight:  [100.8 kg (222 lb 3.6 oz)] 100.8 kg (222 lb 3.6 oz) (11/13 0950) HEMODYNAMICS:     VENTILATOR SETTINGS: Vent Mode:  [-] PRVC FiO2 (%):  [50 %-100 %] 100 % Set Rate:  [35 bmp] 35 bmp Vt Set:  [500 mL] 500 mL PEEP:  [5 cmH20] 5 cmH20 Plateau Pressure:  [18 cmH20] 18 cmH20 INTAKE / OUTPUT: Intake/Output      11/12 0701 - 11/13 0700 11/13  0701 - 11/14 0700   Urine (mL/kg/hr)  1500 (3.3)   Total Output  1500   Net  -1500          PHYSICAL EXAMINATION: General:  Ill appearing female patient in moderate distress Neuro:  Lethargic, but arousales and able to answer some questions. HEENT:  Neck supple, no masses or lumps noted Cardiovascular:  Regular rate, rapid rhythm, +4 noted in all ext, no edema noted. Hypertensive on the monitor Lungs: Clear to auscultation bilaterally  Abdomen:  Soft, distended, nontender Musculoskeletal:  Intact, moves all ext Skin: Mottling noted to bilateral lower ext. Otherwise intact  LABS:  Lab 10/24/12 1001 10/24/12 0822 10/24/12 0806 10/24/12 0803 10/24/12 0802  HGB -- 16.0* -- -- 13.9  WBC -- -- -- -- 28.1*  PLT -- -- -- -- 274  NA -- 138 -- -- 137  K -- 3.9 -- -- 4.0  CL -- 114* -- -- 101  CO2 -- -- -- -- <7*  GLUCOSE -- 418* -- -- 445*  BUN -- 12 -- -- 13  CREATININE -- 1.40* -- -- 1.23*  CALCIUM -- -- -- -- 9.5  MG -- -- -- -- --  PHOS -- -- -- -- --  AST -- -- -- -- --  ALT -- -- -- -- --  ALKPHOS -- -- -- -- --  BILITOT -- -- -- -- --  PROT -- -- -- -- --  ALBUMIN -- -- -- -- --  APTT -- -- -- -- --  INR -- -- -- -- --  LATICACIDVEN -- -- -- 2.2 --  TROPONINI -- -- <0.30 -- --  PROCALCITON -- -- -- -- --  PROBNP -- -- -- -- --  O2SATVEN -- -- -- -- --  PHART 6.975* -- 6.945* -- --  PCO2ART 25.4* -- 14.5* -- --  PO2ART 258.0* -- 218.0* -- --    Lab 10/24/12 0807  GLUCAP 405*    IMAGING: CXR 11/13: lungs without edema, normal cardiac size.  ECG: ST on the monitor  DIAGNOSES: Principal Problem:  *DKA (diabetic ketoacidoses) Active Problems:  Leukocytosis  HTN (hypertension)  Diabetes mellitus   ASSESSMENT / PLAN:  PULMONARY  ASSESSMENT:  Acute Respiratory Failure related to metabolic acidosis, and altered mental status.   PLAN:   -Intubate now -Vent settings as above, will need to maximize Ve given profound acidosis.  -Serial ABGs -Morning  CXR -Patient is wanting very large tidal volumes and is very asynchronous on PRVC.  With PCV was much more comfortable and on 5/5 or 10/5 was taking very large TV, will keep in 10/5 for now with f/u ABG.    CARDIOVASCULAR  ASSESSMENT:  Hypertension/ hypertensive crisis - despite severe  acidosis Tachycardia  PLAN:  Labetalol IV for tachycardia and hypertension Aim for 25% MAP for first 12hr, then resume home regimen  Cardiac enzymes x3  RENAL  ASSESSMENT:   Acute renal failure - related to DKA and dehydration, baseline CR .84 in June, now 1.40 +anion gap and non-anion gap acidosis Suspect anion component d/t DKA and lactate. Suspect non-gap component d/t renal injury.  PLAN:   Rehydration via DKA protocol Bicarb gtt in setting of life threatening acidosis and non-gap process Q2H BMP Monitor for hypokalemia and aggressively treat  GASTROINTESTINAL  ASSESSMENT:   No acute issues  PLAN:   Check LFTs NPO for now GI px with protonix Check lipase   HEMATOLOGIC  ASSESSMENT:   No acute issues. Anticipate hemodilution.   PLAN:  Monitor CBC Q12H  INFECTIOUS  ASSESSMENT:  SIRS Leukocytosis - source unclear at this time, however some worry for skin infection r/t reported "boil"  PLAN:   Monitor WBC Cultures as above ABX per dashboard  ENDOCRINE  ASSESSMENT:   Diabetic Ketoacidosis with Anion Gap    PLAN:   Rehydration Insulin drip BMP monitoring  NEUROLOGIC  ASSESSMENT:  Acute metabolic encephalopathy - related to metabolic acidosis, and possibly hypertensive encephalopathy   PLAN:   Intubated Propofol for sedation Fentanyl PRN Supportive care   CLINICAL SUMMARY: Rebecca Rhodes is a 31 yo female patient who presented with DKA, profound acidosis, and hypertensive crisis (reported taking any of her  meds). She was intubated for altered mental status. We will admit her to the ICU: place her on bicarb gtt (for profound acidosis and NAG component of acidosis),  cont DKA protocol w/ insulin and IVFs.  Will place her on empiric abx given recent h/o boil on leg and pan culture. We will need to keep close obs on acid base, and K over next few hours as we correct her acidosis. Also aim for 25% MAP reduction for HTN, using labetolol for now.   I have personally obtained a history, examined the patient, evaluated laboratory and imaging results, formulated the assessment and plan and placed orders.  CRITICAL CARE: The patient is critically ill with multiple organ systems failure and requires high complexity decision making for assessment and support, frequent evaluation and titration of therapies, application of advanced monitoring technologies and extensive interpretation of multiple databases. Critical Care Time devoted to patient care services described in this note is 55 minutes.   Patient seen and examined, agree with above note.  I dictated the care and orders written for this patient under my direction.  Koren Bound, M.D. [phone]

## 2012-10-24 NOTE — ED Provider Notes (Signed)
History     CSN: [id number]  Arrival date & time 10/24/12  0744   First MD Initiated Contact with Patient 10/24/12 0747      Chief Complaint  Patient presents with  . Shortness of Breath     Patient is a 31 y.o. female presenting with shortness of breath. The history is provided by the patient and the EMS personnel. The history is limited by the condition of the patient.  Shortness of Breath  The current episode started yesterday. The onset was gradual. The problem occurs continuously. The problem has been gradually worsening. The problem is severe. The symptoms are relieved by beta-agonist inhalers. Nothing aggravates the symptoms. Associated symptoms include shortness of breath and wheezing. Pertinent negatives include no chest pain.  Pt presents from home for shortness of breath She reports having SOB that started yesterday She denies fever, vomiting.  No CP is reported. Past Medical History  Diagnosis Date  . Diabetes mellitus   . Hypertension   . HTN (hypertension) 06/04/2012  . Hyperlipidemia 06/04/2012  . Diabetes mellitus 06/04/2012    History reviewed. No pertinent past surgical history.  History reviewed. No pertinent family history.  History  Substance Use Topics  . Smoking status: Never Smoker   . Smokeless tobacco: Not on file  . Alcohol Use: No    OB History    Grav Para Term Preterm Abortions TAB SAB Ect Mult Living                  Review of Systems  Unable to perform ROS: Unstable vital signs  Respiratory: Positive for shortness of breath and wheezing.   Cardiovascular: Negative for chest pain.    Allergies  Review of patient's allergies indicates no known allergies.  Home Medications   Current Outpatient Rx  Name  Route  Sig  Dispense  Refill  . INSULIN GLARGINE 100 UNIT/ML [place] SOLN   Subcutaneous   Inject 30 Units into the skin 2 (two) times daily.         . INSULIN REGULAR HUMAN 100 UNIT/ML IJ SOLN   Subcutaneous   Inject 10-25 Units  into the skin 3 (three) times daily before meals. Per sliding scale.         Marland Kitchen LABETALOL HCL 200 MG PO TABS   Oral   Take 200 mg by mouth 2 (two) times daily.         Marland Kitchen LEVOFLOXACIN 750 MG PO TABS   Oral   Take 750 mg by mouth daily.         Marland Kitchen LISINOPRIL 10 MG PO TABS   Oral   Take 10 mg by mouth daily.           BP 262/144  Resp 34  SpO2 100%  Physical Exam CONSTITUTIONAL: Well developed/well nourished, uncomfortable appearing, smells ketotic HEAD AND FACE: Normocephalic/atraumatic EYES: EOMI/PERRL ENMT: Mucous membranes dry NECK: supple no meningeal signs SPINE:entire spine nontender CV: S1/S2 noted, no murmurs/rubs/gallops noted LUNGS: Lungs are clear to auscultation bilaterally,tachypneic ABDOMEN: soft, nontender, no rebound or guarding GU:no cva tenderness NEURO: Pt is awake/alert, moves all extremitiesx4 EXTREMITIES: pulses normal, full ROM SKIN: warm, color normal PSYCH:anxious  ED Course  Procedures   CRITICAL CARE Performed by: Joya Gaskins   Total critical care time: 36  Critical care time was exclusive of separately billable procedures and treating other patients.  Critical care was necessary to treat or prevent imminent or life-threatening deterioration.  Critical care was time spent personally by  me on the following activities: development of treatment plan with patient and/or surrogate as well as nursing, discussions with consultants, evaluation of patient's response to treatment, examination of patient, obtaining history from patient or surrogate, ordering and performing treatments and interventions, ordering and review of laboratory studies, ordering and review of radiographic studies, pulse oximetry and re-evaluation of patient's condition.   Labs Reviewed  CBC WITH DIFFERENTIAL  BASIC METABOLIC PANEL  TROPONIN I  TROPONIN I  TROPONIN I  LACTIC ACID, PLASMA   8:00 AM Pt seen on arrival for SOB EMS reports initially that she  was wheezing, given nebs and some improvement She is still tachypneic Lung sounds clear - she smells ketotic with h/o DKA, suspect this could be partially caused by metabolic acidosis Will try cpap for comfort while labs/imaging pending Will follow closely 8:32 AM DKA noted Pt still tachypneic Insulin ordered IV fluids ordered She is protecting her airway able to speak to me Will call for admission 8:45 AM cpap ended, pt did not require any further airway management D/w critical care, will admit due to severe DKA   MDM  Nursing notes including past medical history and social history reviewed and considered in documentation Labs/vital reviewed and considered Previous records reviewed and considered - h/o DKA xrays reviewed and considered     Date: 10/24/2012  Rate: 129  Rhythm: sinus tachycardia  QRS Axis: normal  Intervals: normal  ST/T Wave abnormalities: nonspecific ST changes  Conduction Disutrbances:none  Narrative Interpretation: artifact noted        Joya Gaskins, MD 10/24/12 [phone]

## 2012-10-24 NOTE — ED Notes (Signed)
Results of chem 8 shown to Dr. Wickline 

## 2012-10-25 DIAGNOSIS — L0231 Cutaneous abscess of buttock: Secondary | ICD-10-CM

## 2012-10-25 DIAGNOSIS — E860 Dehydration: Secondary | ICD-10-CM

## 2012-10-25 DIAGNOSIS — R109 Unspecified abdominal pain: Secondary | ICD-10-CM

## 2012-10-25 LAB — BLOOD GAS, ARTERIAL
Acid-base deficit: 7 mmol/L — ABNORMAL HIGH (ref 0.0–2.0)
Drawn by: 28701
O2 Saturation: 99.5 %
PEEP: 5 cmH2O
Patient temperature: 98.6
Pressure control: 10 cmH2O
RATE: 15 resp/min

## 2012-10-25 LAB — BASIC METABOLIC PANEL
BUN: 12 mg/dL (ref 6–23)
CO2: 19 mEq/L (ref 19–32)
CO2: 19 mEq/L (ref 19–32)
CO2: 20 mEq/L (ref 19–32)
Calcium: 8.6 mg/dL (ref 8.4–10.5)
Chloride: 112 mEq/L (ref 96–112)
Chloride: 115 mEq/L — ABNORMAL HIGH (ref 96–112)
Chloride: 111 mEq/L (ref 96–112)
Creatinine, Ser: 1.26 mg/dL — ABNORMAL HIGH (ref 0.50–1.10)
Creatinine, Ser: 1.39 mg/dL — ABNORMAL HIGH (ref 0.50–1.10)
GFR calc Af Amer: 62 mL/min — ABNORMAL LOW (ref >90)
GFR calc non Af Amer: 54 mL/min — ABNORMAL LOW (ref >90)
GFR calc non Af Amer: 56 mL/min — ABNORMAL LOW (ref >90)
Glucose, Bld: 131 mg/dL — ABNORMAL HIGH (ref 70–99)
Glucose, Bld: 223 mg/dL — ABNORMAL HIGH (ref 70–99)
Potassium: 3.2 mEq/L — ABNORMAL LOW (ref 3.5–5.1)
Potassium: 4.2 mEq/L (ref 3.5–5.1)
Potassium: 3.2 mEq/L — ABNORMAL LOW (ref 3.5–5.1)
Sodium: 141 mEq/L (ref 135–145)
Sodium: 143 mEq/L (ref 135–145)
Sodium: 138 mEq/L (ref 135–145)

## 2012-10-25 LAB — CBC
Hemoglobin: 11.5 g/dL — ABNORMAL LOW (ref 12.0–15.0)
MCH: 28.8 pg (ref 26.0–34.0)
MCHC: 33.3 g/dL (ref 30.0–36.0)
MCV: 86.3 fL (ref 78.0–100.0)
Platelets: 211 10*3/uL (ref 150–400)

## 2012-10-25 LAB — GLUCOSE, CAPILLARY
Glucose-Capillary: 194 mg/dL — ABNORMAL HIGH (ref 70–99)
Glucose-Capillary: 150 mg/dL — ABNORMAL HIGH (ref 70–99)
Glucose-Capillary: 129 mg/dL — ABNORMAL HIGH (ref 70–99)
Glucose-Capillary: 234 mg/dL — ABNORMAL HIGH (ref 70–99)
Glucose-Capillary: 267 mg/dL — ABNORMAL HIGH (ref 70–99)

## 2012-10-25 LAB — URINE CULTURE: Colony Count: NO GROWTH

## 2012-10-25 LAB — PROTIME-INR: Prothrombin Time: 15 seconds (ref 11.6–15.2)

## 2012-10-25 MED ORDER — FLUCONAZOLE IN SODIUM CHLORIDE 200-0.9 MG/100ML-% IV SOLN
150.0000 mg | INTRAVENOUS | Status: DC
  Filled 2012-10-25: qty 100

## 2012-10-25 MED ORDER — SODIUM CHLORIDE 0.9 % IV SOLN
INTRAVENOUS | Status: DC
  Administered 2012-10-25: 20.1 [IU]/h via INTRAVENOUS
  Filled 2012-10-25: qty 1

## 2012-10-25 MED ORDER — INSULIN GLARGINE 100 UNIT/ML ~~LOC~~ SOLN: 15.0000 [IU] | Freq: Every day | SUBCUTANEOUS | Status: DC

## 2012-10-25 MED ORDER — POTASSIUM CHLORIDE CRYS ER 20 MEQ PO TBCR
40.0000 meq | EXTENDED_RELEASE_TABLET | Freq: Once | ORAL | Status: AC
  Administered 2012-10-25: 40 meq via ORAL
  Filled 2012-10-25: qty 2

## 2012-10-25 MED ORDER — POTASSIUM CHLORIDE 10 MEQ/50ML IV SOLN
10.0000 meq | INTRAVENOUS | Status: AC
  Administered 2012-10-25 (×4): 10 meq via INTRAVENOUS
  Filled 2012-10-25: qty 200

## 2012-10-25 MED ORDER — INSULIN ASPART 100 UNIT/ML ~~LOC~~ SOLN
0.0000 [IU] | SUBCUTANEOUS | Status: DC
  Administered 2012-10-25: 5 [IU] via SUBCUTANEOUS
  Administered 2012-10-25: 2 [IU] via SUBCUTANEOUS
  Administered 2012-10-25: 8 [IU] via SUBCUTANEOUS
  Administered 2012-10-25 – 2012-10-26 (×3): 5 [IU] via SUBCUTANEOUS
  Administered 2012-10-26: 8 [IU] via SUBCUTANEOUS

## 2012-10-25 MED ORDER — SODIUM CHLORIDE 0.9 % IV SOLN
INTRAVENOUS | Status: DC
  Administered 2012-10-25: 13:00:00 via INTRAVENOUS
  Filled 2012-10-25: qty 0.01

## 2012-10-25 MED ORDER — INSULIN GLARGINE 100 UNIT/ML ~~LOC~~ SOLN
15.0000 [IU] | Freq: Every day | SUBCUTANEOUS | Status: DC
  Administered 2012-10-25 – 2012-10-26 (×2): 15 [IU] via SUBCUTANEOUS

## 2012-10-25 MED ORDER — FLUCONAZOLE IN SODIUM CHLORIDE 200-0.9 MG/100ML-% IV SOLN
200.0000 mg | INTRAVENOUS | Status: AC
Start: 2012-10-26 — End: 2012-10-27
  Administered 2012-10-26 – 2012-10-27 (×2): 200 mg via INTRAVENOUS
  Filled 2012-10-25 (×2): qty 100

## 2012-10-25 MED ORDER — INSULIN ASPART 100 UNIT/ML ~~LOC~~ SOLN: 0.0000 [IU] | SUBCUTANEOUS | Status: DC

## 2012-10-25 NOTE — Progress Notes (Signed)
eLink Physician-Brief Progress Note Patient Name: Rebecca Rhodes DOB: 1981-10-12 MRN: [id number]  Date of Service  10/25/2012   HPI/Events of Note     eICU Interventions  Hypokalemia - repleted   Intervention Category Minor Interventions: Electrolytes abnormality - evaluation and management  DETERDING,ELIZABETH 10/25/2012, 3:34 AM

## 2012-10-25 NOTE — Progress Notes (Signed)
Name: Coralie Stanke MRN: [id number] DOB: December 29, 1980    LOS: 1  PCCM ADMISSION NOTE  BRIEF PATIENT DESCRIPTION: Telma Pyeatt is a 31 yo female who presented to [facility] via EMS on 11/13 with a chief complaint of shortness of breath. She was found to be in DKA with a AG of 20.1. The patient was placed on CPAP in the ER, given 2L of NS and an insulin drip was ordered. PCCM was called for admission and further management.   Interim Events: Gap closed, remains intubated but awake  LINES / TUBES:  Left CVC IJ 11/13 >>>  Foley 11/13 >>>  oett 11/13>>>   CULTURES:  Blood x2 11/13 >>>  Urine 11/13 >>>  Sputum 11/13 >>>   ANTIBIOTICS:  Zosyn 11/13 >>>  Vanc 11/13 >>>   SIGNIFICANT EVENTS:  11/13: Intubated/1114- improved ph, neurostatus  LEVEL OF CARE: ICU  PRIMARY SERVICE: PCCM  CONSULTANTS: Pharm  CODE STATUS: Full  DIET: NPO  DVT Px: Heparin prophylaxis  GI Px: Protonix   Vital Signs: Temp:  [98.5 F (36.9 C)-100 F (37.8 C)] 99.4 F (37.4 C) (11/14 0737) Pulse Rate:  [110-129] 122  (11/14 0917) Resp:  [15-46] 18  (11/14 0917) BP: (107-227)/(58-139) 151/89 mmHg (11/14 0917) SpO2:  [100 %] 100 % (11/14 0917) Arterial Line BP: (89-162)/(58-135) 140/135 mmHg (11/14 0700) FiO2 (%):  [40 %-100 %] 40 % (11/14 0917) Weight:  [197 lb 12 oz (89.7 kg)-222 lb 3.6 oz (100.8 kg)] 197 lb 12 oz (89.7 kg) (11/14 0500) I/O last 3 completed shifts: In: 5495.6 [I.V.:4399.1; IV Piggyback:1096.5] Out: 3410 [Urine:2910; Emesis/NG output:500]  Physical Examination: General: Ill appearing female, intubated and sedated  Neuro: Lethargic, but arouses and able to answer some questions.  HEENT: Neck supple, no masses or lumps noted  Cardiovascular: Regular rate, rapid rhythm, +4 noted in all ext, no edema noted. Hypertensive on the monitor  Lungs: Clear to auscultation bilaterally  Abdomen: Soft, distended, nontender  Musculoskeletal: Intact, moves all ext  Skin: warm, dry    Ventilator  settings: Vent Mode:  [-] PCV FiO2 (%):  [40 %-100 %] 40 % Set Rate:  [14 bmp-35 bmp] 15 bmp Vt Set:  [500 mL] 500 mL PEEP:  [5 cmH20] 5 cmH20 Plateau Pressure:  [11 cmH20-18 cmH20] 13 cmH20  Labs and Imaging:  Reviewed.  Please refer to the Assessment and Plan section for relevant results.  ASSESSMENT AND PLAN  PULMONARY  Lab 10/25/12 0433 10/24/12 1614 10/24/12 1241 10/24/12 1001 10/24/12 0806  PHART 7.322* 7.175* 7.010* 6.975* 6.945*  PCO2ART 35.8 17.6* 20.2* 25.4* 14.5*  PO2ART 184.0* 234.0* 297.0* 258.0* 218.0*  HCO3 18.0* 6.5* 5.1* 5.9* 3.1*  O2SAT 99.5 100.0 100.0 100.0 99.0   Acute Respiratory Failure related to metabolic acidosis, and altered mental status. improved PLAN:   - Improved acidosis  On PVC setting, no futher massive compensation needed -can keep PC as mode with 10 over peep 5, volumes appears appropriate -no further abg -wean cpap 5 ps 5, assess rsbi at 30 and abg  CARDIOVASCULAR  Lab 10/24/12 2000 10/24/12 1349 10/24/12 1130 10/24/12 0806 10/24/12 0803  TROPONINI <0.30 <0.30 -- <0.30 --  LATICACIDVEN -- -- 2.3* -- 2.2  PROBNP -- -- -- -- --   CARDIOVASCULAR  ASSESSMENT:  Hypertension/ hypertensive crisis - despite severe acidosis  Tachycardia resolve after volume resusciation Resolved BP Prn beta blocker  RENAL  Lab 10/25/12 0635 10/25/12 0230 10/25/12 0015 10/24/12 2200 10/24/12 2000  NA 143 141 139 140 141  K 4.2 3.2* -- -- --  CL 115* 112 112 111 111  CO2 20 19 19  17* 13*  BUN 14 14 14 14 14   CREATININE 1.49* 1.39* 1.26* 1.28* 1.36*  CALCIUM 8.7 8.6 8.6 8.6 8.5  MG -- -- -- -- --  PHOS -- -- -- -- --   ASSESSMENT:  Acute renal failure - related to DKA and dehydration, baseline CR .84 Resolved gap  PLAN:  Keep on D5 while not on TF/PO feeds  S/p Bicarb drip,dc no role for this. May harm and delay reoslution  q 12 BMET K+ stable  GASTROINTESTINAL  Lab 10/24/12 1109  AST 11  ALT <5  ALKPHOS 113  BILITOT 0.2*  PROT 7.9    ALBUMIN 3.0*   ASSESSMENT:  No acute issues, Lipase WNL  PLAN:  NPO for now, wean off vent and advance diet GI px with protonix   HEMATOLOGIC  Lab 10/25/12 0440 10/24/12 0822 10/24/12 0802  HGB 11.5* 16.0* 13.9  HCT 34.5* 47.0* 42.0  PLT 211 -- 274  INR -- -- --  APTT -- -- --   ASSESSMENT:  No acute issues. Anticipate hemodilution.   PLAN:  Monitor CBC QAM coags assessment sub q hep  INFECTIOUS  Lab 10/25/12 0440 10/24/12 0802  WBC 15.9* 28.1*  PROCALCITON -- --   ASSESSMENT:  SIRS  Leukocytosis - source unclear at this time, however some worry for skin infection r/t reported "boil"   PLAN:  Monitor WBC  Cultures as above  Cont Vanc, Zosyn Check leg lesion, may need drainage surgery  ENDOCRINE  Lab 10/25/12 0605 10/25/12 0504 10/25/12 0409 10/25/12 0312 10/25/12 0216  GLUCAP 130* 129* 150* 159* 194*   ASSESSMENT:  Diabetic Ketoacidosis with Anion Gap and resolved  PLAN:  Phase 3 Hyperglycemia Protocol insulin ssi  NEUROLOGIC ASSESSMENT:  Acute metabolic encephalopathy - related to metabolic acidosis, and possibly hypertensive encephalopathy   PLAN:  Intubated  Propofol for sedation  Fentanyl PRN  Supportive care improved   Global: 31 year old F w/ severe DKA w/ encephalopathy and acute respiratory failure; DKA & encephalopathy improved; appropriate for wean vent & advance diet when tolerated  Si Raider. Clinton Sawyer, MD, MBA 10/25/2012, 10:01 AM Family Medicine Resident, PGY-2 [phone] pager  Ccm time 30 min   Personal time exam , management  Mcarthur Rossetti. Tyson Alias, MD, FACP Pgr: [phone] [facility]

## 2012-10-25 NOTE — Procedures (Signed)
Extubation Procedure Note  Patient Details:   Name: Rebecca Rhodes DOB: 07-15-1981 MRN: [id number]   Airway Documentation:  Airway 7 mm (Active)     Airway 7.5 mm (Active)  Secured at (cm) 23 cm 10/25/2012 11:29 AM  Measured From Lips 10/25/2012 11:29 AM  Secured Location Center 10/25/2012 11:29 AM  Secured By [employer] 10/25/2012 11:29 AM  Tube Holder Repositioned Yes 10/25/2012 11:29 AM  Cuff Pressure (cm H2O) 24 cm H2O 10/24/2012  8:22 PM  Site Condition Dry 10/25/2012  9:17 AM    Evaluation  O2 sats: stable throughout Complications: No apparent complications Patient did tolerate procedure well. Bilateral Breath Sounds: Clear Suctioning: Airway Yes. Pt has good gag/cough, no stridor, min secretions. Pt tol well. Extubated to 4L [name]  [name] 10/25/2012, 1:46 PM

## 2012-10-26 LAB — BASIC METABOLIC PANEL
BUN: 10 mg/dL (ref 6–23)
BUN: 11 mg/dL (ref 6–23)
CO2: 16 mEq/L — ABNORMAL LOW (ref 19–32)
CO2: 14 mEq/L — ABNORMAL LOW (ref 19–32)
Chloride: 109 mEq/L (ref 96–112)
Chloride: 107 mEq/L (ref 96–112)
GFR calc non Af Amer: 68 mL/min — ABNORMAL LOW (ref >90)
Glucose, Bld: 274 mg/dL — ABNORMAL HIGH (ref 70–99)
Glucose, Bld: 316 mg/dL — ABNORMAL HIGH (ref 70–99)
Potassium: 3.2 mEq/L — ABNORMAL LOW (ref 3.5–5.1)
Potassium: 3.6 mEq/L (ref 3.5–5.1)
Sodium: 136 mEq/L (ref 135–145)
Sodium: 136 mEq/L (ref 135–145)

## 2012-10-26 LAB — GLUCOSE, CAPILLARY
Glucose-Capillary: 261 mg/dL — ABNORMAL HIGH (ref 70–99)
Glucose-Capillary: 331 mg/dL — ABNORMAL HIGH (ref 70–99)
Glucose-Capillary: 317 mg/dL — ABNORMAL HIGH (ref 70–99)
Glucose-Capillary: 336 mg/dL — ABNORMAL HIGH (ref 70–99)

## 2012-10-26 LAB — CBC
HCT: 31.2 % — ABNORMAL LOW (ref 36.0–46.0)
Hemoglobin: 10.3 g/dL — ABNORMAL LOW (ref 12.0–15.0)
MCHC: 33 g/dL (ref 30.0–36.0)
RBC: 3.59 MIL/uL — ABNORMAL LOW (ref 3.87–5.11)
WBC: 12.6 10*3/uL — ABNORMAL HIGH (ref 4.0–10.5)

## 2012-10-26 MED ORDER — PNEUMOCOCCAL VAC POLYVALENT 25 MCG/0.5ML IJ INJ
0.5000 mL | INJECTION | INTRAMUSCULAR | Status: AC
  Administered 2012-10-27: 0.5 mL via INTRAMUSCULAR
  Filled 2012-10-26: qty 0.5

## 2012-10-26 MED ORDER — LABETALOL HCL 100 MG PO TABS
100.0000 mg | ORAL_TABLET | Freq: Two times a day (BID) | ORAL | Status: DC
  Administered 2012-10-26 – 2012-11-06 (×23): 100 mg via ORAL
  Filled 2012-10-26 (×28): qty 1

## 2012-10-26 MED ORDER — PANTOPRAZOLE SODIUM 40 MG PO TBEC
40.0000 mg | DELAYED_RELEASE_TABLET | Freq: Every day | ORAL | Status: DC
  Administered 2012-10-26 – 2012-11-04 (×9): 40 mg via ORAL
  Filled 2012-10-26 (×10): qty 1

## 2012-10-26 MED ORDER — INFLUENZA VIRUS VACC SPLIT PF IM SUSP
0.5000 mL | INTRAMUSCULAR | Status: AC
  Administered 2012-10-27: 0.5 mL via INTRAMUSCULAR
  Filled 2012-10-26: qty 0.5

## 2012-10-26 MED ORDER — LISINOPRIL 10 MG PO TABS
10.0000 mg | ORAL_TABLET | Freq: Every day | ORAL | Status: DC
  Administered 2012-10-26 – 2012-11-06 (×11): 10 mg via ORAL
  Filled 2012-10-26 (×12): qty 1

## 2012-10-26 MED ORDER — POTASSIUM CHLORIDE CRYS ER 20 MEQ PO TBCR
40.0000 meq | EXTENDED_RELEASE_TABLET | Freq: Two times a day (BID) | ORAL | Status: AC
  Administered 2012-10-26 (×2): 40 meq via ORAL
  Filled 2012-10-26 (×2): qty 2

## 2012-10-26 MED ORDER — INSULIN ASPART 100 UNIT/ML ~~LOC~~ SOLN
0.0000 [IU] | Freq: Three times a day (TID) | SUBCUTANEOUS | Status: DC
  Administered 2012-10-26 (×3): 11 [IU] via SUBCUTANEOUS
  Administered 2012-10-27: 8 [IU] via SUBCUTANEOUS

## 2012-10-26 MED FILL — Fluconazole in NaCl 0.9% Inj 200 MG/100ML: INTRAVENOUS | Qty: 100 | Status: AC

## 2012-10-26 NOTE — Care Management Note (Signed)
  Page 2 of 2   11/06/2012     2:19:15 PM   CARE MANAGEMENT NOTE 11/06/2012  Patient:  Rebecca Rhodes, Rebecca Rhodes   Account Number:  [id number]  Date Initiated:  10/24/2012  Documentation initiated by:  Avie Arenas  Subjective/Objective Assessment:   DKA - SOB - intubated.     Action/Plan:   Anticipated DC Date:  11/06/2012   Anticipated DC Plan:  HOME W HOME HEALTH SERVICES      DC Planning Services  CM consult  Medication Assistance      Choice offered to / List presented to:          [facility] arranged  HH-1 RN      [facility] agency  [facility]   Status of service:  In process, will continue to follow Medicare Important Message given?   (If response is "NO", the following Medicare IM given date fields will be blank) Date Medicare IM given:   Date Additional Medicare IM given:    Discharge Disposition:    Per UR Regulation:  Reviewed for med. necessity/level of care/duration of stay  If discussed at Long Length of Stay Meetings, dates discussed:    Comments:  Contact:  Stangeland,Derrick Brother [phone]   11-05-12 Patient has decided to stay with her mother at discharge. Address is [address] [place], [place]  phone [phone]  Information given to [facility]  Ronny Flurry RN BSN 11-05-12 Patient is not requiring sedation for dressing changes . States she is comfortable with going home with home health . She stated her boyfriend will assist with dressing changes.  Patient would like [facility].  Ronny Flurry RN BSN [phone]   11-01-12 Gave patient Urgent Care's number [phone], to make follow up appointment .  Ronny Flurry RN BSn [phone]  10-29-12 1557 Received an email : Effective Monday, Nov. 18, the [facility] on [address] is expanding its services to offer a second practice in the same location. People with limited financial means or who do not have a primary care provider, including those previously seen by [facility], can be seen at this location until February 2014.  Called  at 1320 , to see if patient can be seen there . Told I had to speak to the triage nurse. Left voice mail . Waiting on a return call.   Ronny Flurry RN BS [phone]  10-29-12 Cindee Lame at [facility] [phone], they are NOT accepting new patients as of today .  Gave patient list of Primary Care Resources . Patient currently on Novolog 109 / 30 and Novolog . Gave patient assistance applications for both medications . Also gave patient [employer] , Comptroller Card .  Will assist with MATCH program at discharge.  Ronny Flurry RN BSN [phone]  10-26-12 9:40am Avie Arenas, RNBSN [phone] Extubated yesterday - states lives with boyfriend, Rebecca Rhodes - [phone].  States goes to [facility] in past, but can't afford the 50 dollar copay to be seen.  Has presciptions for meds but can't afford to get them filled. Has some supplies at home.  Uses vial instead of pens. Coupon on chart lilly product.   Talked to physician about trying to get into family or internal medicine clinics at [facility].

## 2012-10-26 NOTE — Progress Notes (Signed)
Inpatient Diabetes Program Recommendations  AACE/ADA: New Consensus Statement on Inpatient Glycemic Control (2013)  Target Ranges:  Prepandial:   less than 140 mg/dL      Peak postprandial:   less than 180 mg/dL (1-2 hours)      Critically ill patients:  140 - 180 mg/dL   Reason for Visit: CBGs 10/26/12  [phone] mg/dl   Inpatient Diabetes Program Recommendations Insulin - Basal: Increase Lantus to 20-25 units daily.   Note: Start back on 70/30 when eating regularly.

## 2012-10-26 NOTE — Evaluation (Signed)
Physical Therapy Evaluation Patient Details Name: Rebecca Rhodes MRN: [id number] DOB: 1981-10-17 Today's Date: 10/26/2012 Time: 4098-1191 PT Time Calculation (min): 46 min  PT Assessment / Plan / Recommendation Clinical Impression  pt admitted with SOB and DKA.  On eval, pt weak and deconditioned with gait unsteadiness.  Will see on acute.    PT Assessment  Patient needs continued PT services    Follow Up Recommendations  Home health PT;Other (comment) (vs no follow if makes great progress)    Does the patient have the potential to tolerate intense rehabilitation      Barriers to Discharge        Equipment Recommendations  None recommended by PT    Recommendations for Other Services     Frequency Min 3X/week    Precautions / Restrictions Precautions Precautions: Fall   Pertinent Vitals/Pain EHR 110's  SaO2 96%      Mobility  Bed Mobility Bed Mobility: Supine to Sit;Sitting - Scoot to Edge of Bed Supine to Sit: 4: Min assist Sitting - Scoot to [place] of Bed: 4: Min guard Details for Bed Mobility Assistance: minimal assist to come forward Transfers Transfers: Sit to Stand;Stand to Sit Sit to Stand: 4: Min guard;With upper extremity assist;From bed;From toilet;4: Min assist Stand to Sit: 4: Min guard;With upper extremity assist;To chair/3-in-1;To toilet Details for Transfer Assistance: min up from lower surface Ambulation/Gait Ambulation/Gait Assistance: 4: Min guard;4: Min assist Ambulation Distance (Feet): 80 Feet (80 after sitting rest break) Assistive device: Other (Comment) (IV pole) Ambulation/Gait Assistance Details: initially stiff gait, then steady until she fatigued then pt became mildly unstable needing to sit. Gait Pattern: Step-through pattern;Decreased step length - right;Decreased step length - left;Decreased stride length (infrequent stepping to regain balance when fatigued) Stairs: No Wheelchair Mobility Wheelchair Mobility: No    Shoulder  Instructions     Exercises     PT Diagnosis: Generalized weakness;Other (comment) (decr activity tolerance)  PT Problem List: Decreased strength;Decreased activity tolerance;Decreased balance;Decreased mobility;Decreased knowledge of use of DME PT Treatment Interventions: DME instruction;Gait training;Functional mobility training;Stair training;Therapeutic activities;Balance training;Patient/family education   PT Goals Acute Rehab PT Goals PT Goal Formulation: With patient Time For Goal Achievement: 11/02/12 Potential to Achieve Goals: Good Pt will go Supine/Side to Sit: Independently;with HOB 0 degrees PT Goal: Supine/Side to Sit - Progress: Goal set today Pt will go Sit to Stand: Independently PT Goal: Sit to Stand - Progress: Goal set today Pt will Transfer Bed to Chair/Chair to Bed: Independently PT Transfer Goal: Bed to Chair/Chair to Bed - Progress: Goal set today Pt will Ambulate: >150 feet;Independently PT Goal: Ambulate - Progress: Goal set today Pt will Go Up / Down Stairs: 3-5 stairs;with modified independence;with least restrictive assistive device PT Goal: Up/Down Stairs - Progress: Goal set today  Visit Information  Last PT Received On: 10/26/12 Assistance Needed: +1    Subjective Data  Subjective: No I'm  not dizzy, just tired Patient Stated Goal: Home, back to work   Prior Functioning  Home Living Lives With: Significant other Available Help at Discharge: Other (Comment) Type of Home: Apartment Home Access: Level entry Home Layout: Two level Alternate Level Stairs-Rails: Left Bathroom Shower/Tub: Tub/shower unit;Curtain Bathroom Toilet: Standard Bathroom Accessibility: No Home Adaptive Equipment: None Prior Function Level of Independence: Independent Able to Take Stairs?: Yes Driving: Yes Vocation: Part time employment Communication Communication: No difficulties Dominant Hand: Right    Cognition  Overall Cognitive Status: Appears within  functional limits for tasks assessed/performed Arousal/Alertness: Awake/alert Orientation Level:  Appears intact for tasks assessed    Extremity/Trunk Assessment Right Lower Extremity Assessment RLE ROM/Strength/Tone: [facility] for tasks assessed;Deficits RLE ROM/Strength/Tone Deficits: grossly weak bil at 3+/5 RLE Coordination: WFL - gross motor Left Lower Extremity Assessment LLE ROM/Strength/Tone: [facility] for tasks assessed;Deficits LLE ROM/Strength/Tone Deficits: grossly weak at 3+/5 Trunk Assessment Trunk Assessment: Normal   Balance Balance Balance Assessed: Yes Static Standing Balance Static Standing - Balance Support: Right upper extremity supported;During functional activity Static Standing - Level of Assistance: 4: Min assist  End of Session PT - End of Session Activity Tolerance: Patient limited by fatigue;Patient tolerated treatment well Patient left: in chair;with call bell/phone within reach Nurse Communication: Mobility status  GP     Erron Wengert, Eliseo Gum 10/26/2012, 2:47 PM  10/26/2012  Wyble Bing, PT [phone] (pager)

## 2012-10-26 NOTE — Progress Notes (Signed)
[facility] ADULT ICU REPLACEMENT PROTOCOL FOR AM LAB REPLACEMENT ONLY  The patient does not apply for the [facility] Adult ICU Electrolyte Replacment Protocol based on the criteria listed below:   1. Is GFR >/= 50 ml/min? yes  Patient's GFR today is 64 2. Is urine output >/= 0.5 ml/kg/hr for the last 8 hours? yes Patient's UOP is 0.85 ml/kg/hr 3. Is BUN < 30 mg/dL? yes  Patient's BUN today is 10 4. Abnormal electrolyte(s): K 3.2 5. Ordered repletion with: ON DKA management 6. If a panic level lab has been reported, has the CCM MD in charge been notified? no.   Physician:    Markus Daft A 10/26/2012 6:59 AM

## 2012-10-26 NOTE — Progress Notes (Signed)
Name: [name] MRN: [id number] DOB: 1981-03-24    LOS: 2  PCCM ADMISSION NOTE  BRIEF PATIENT DESCRIPTION: Rebecca Rhodes is a 31 yo female who presented to [facility] via EMS on 11/13 with a chief complaint of shortness of breath. She was found to be in DKA with a AG of 20.1. The patient was placed on CPAP in the ER, given 2L of NS and an insulin drip was ordered. PCCM was called for admission and further management.  Interim Events: Extubated, awake but drowsy, no complaints this AM  LINES / TUBES:  Left CVC IJ 11/13 >>>  Foley 11/13 >>>  oett 11/13>>> 11/15  CULTURES:  Blood x2 11/13 >>>  Urine 11/13 >>> neg Sputum 11/13 >>>   ANTIBIOTICS:  Zosyn 11/13 >>> 11/15 Vanc 11/13 >>> 11/15  SIGNIFICANT EVENTS:  11/13: Intubated/1114- improved ph, neurostatus 11/4 Extubated, gap closed   LEVEL OF CARE: ICU  PRIMARY SERVICE: PCCM  CONSULTANTS: Pharm  CODE STATUS: Full  DIET: NPO  DVT Px: Heparin prophylaxis  GI Px: Protonix   Vital Signs: Temp:  [98.4 F (36.9 C)-99.4 F (37.4 C)] 98.4 F (36.9 C) (11/15 0037) Pulse Rate:  [97-127] 97  (11/15 0600) Resp:  [1-24] 13  (11/15 0600) BP: (116-153)/(65-95) 145/95 mmHg (11/15 0600) SpO2:  [99 %-100 %] 100 % (11/15 0600) Arterial Line BP: (104-137)/(87-126) 104/87 mmHg (11/14 1300) FiO2 (%):  [40 %] 40 % (11/14 1200) Weight:  [201 lb 15.1 oz (91.6 kg)] 201 lb 15.1 oz (91.6 kg) (11/15 0600) I/O last 3 completed shifts: In: 7180.8 [P.O.:680; I.V.:5240.8; IV Piggyback:1260] Out: 3025 [Urine:2525; Emesis/NG output:500] Positive 4L since admission   Physical Examination: General: well appearing, pleasant and conversant  Neuro: alert and oriented x 4, no focal deficits  HEENT: Neck supple, no masses or lumps noted  Cardiovascular: Regular rate, rapid rhythm, +4 noted in all ext, no edema noted. Hypertensive on the monitor  Lungs: Clear to auscultation bilaterally  Abdomen: Soft, distended, nontender  Musculoskeletal: Intact, moves  all ext  Skin: warm, dry    Ventilator settings: Off Vent  Labs and Imaging:  Reviewed.  Please refer to the Assessment and Plan section for relevant results.  ASSESSMENT AND PLAN  PULMONARY  Lab 10/25/12 0433 10/24/12 1614 10/24/12 1241 10/24/12 1001 10/24/12 0806  PHART 7.322* 7.175* 7.010* 6.975* 6.945*  PCO2ART 35.8 17.6* 20.2* 25.4* 14.5*  PO2ART 184.0* 234.0* 297.0* 258.0* 218.0*  HCO3 18.0* 6.5* 5.1* 5.9* 3.1*  O2SAT 99.5 100.0 100.0 100.0 99.0   Acute Respiratory Failure related to metabolic acidosis, and altered mental status. improved PLAN:   - Stable on room air -ambulation  CARDIOVASCULAR  Lab 10/24/12 2000 10/24/12 1349 10/24/12 1130 10/24/12 0806 10/24/12 0803  TROPONINI <0.30 <0.30 -- <0.30 --  LATICACIDVEN -- -- 2.3* -- 2.2  PROBNP -- -- -- -- --   CARDIOVASCULAR  ASSESSMENT:  Hypertension/ hypertensive crisis - despite severe acidosis  Tachycardia resolve after volume resusciation Labetolol 100 mg BID, restart lisinopril  RENAL  Lab 10/26/12 0554 10/25/12 1845 10/25/12 0635 10/25/12 0230 10/25/12 0015  NA 136 138 143 141 139  K 3.2* 3.2* -- -- --  CL 109 111 115* 112 112  CO2 16* 17* 20 19 19   BUN 10 12 14 14 14   CREATININE 1.13* 1.30* 1.49* 1.39* 1.26*  CALCIUM 8.3* 8.4 8.7 8.6 8.6  MG -- -- -- -- --  PHOS -- -- -- -- --   ASSESSMENT:  Acute renal failure - related to DKA  and dehydration, baseline CR .84 Resolved gap, but currently with non AG acidosis   PLAN:  Replete K+ PO Start ACE-I Consider HCO3 for non AG acidosis, suspect RTA, repeat chem in am   GASTROINTESTINAL  Lab 10/24/12 1109  AST 11  ALT <5  ALKPHOS 113  BILITOT 0.2*  PROT 7.9  ALBUMIN 3.0*   ASSESSMENT:  No acute issues, Lipase WNL  PLAN:  Advance diet  GI px with protonix, dc if diet wnl  HEMATOLOGIC  Lab 10/26/12 0554 10/25/12 1145 10/25/12 0440 10/24/12 0822 10/24/12 0802  HGB 10.3* -- 11.5* 16.0* 13.9  HCT 31.2* -- 34.5* 47.0* 42.0  PLT 206 -- 211  -- 274  INR -- 1.20 -- -- --  APTT -- -- -- -- --   ASSESSMENT:  No acute issues. Anticipate hemodilution.   PLAN:  Monitor CBC QAM coags assessment - wnl sub q hep  INFECTIOUS  Lab 10/26/12 0554 10/25/12 0440 10/24/12 0802  WBC 12.6* 15.9* 28.1*  PROCALCITON -- -- --   ASSESSMENT:  SIRS  Leukocytosis - source unclear at this time, however some worry for skin infection r/t reported "boil" , no significant findings, chronic fiborisis  PLAN:  D/c vacn/zosyn since no signs of infection  ENDOCRINE  Lab 10/26/12 0414 10/26/12 0029 10/25/12 1943 10/25/12 1608 10/25/12 1136  GLUCAP 228* 228* 242* 267* 234*   ASSESSMENT:  Diabetic Ketoacidosis with Anion Gap and resolved  PLAN:  Lantus to SSI; advance diet Non AG acidosis  May need lantus bid  NEUROLOGIC ASSESSMENT:  Acute metabolic encephalopathy - related to metabolic acidosis, and possibly hypertensive encephalopathy   PLAN:  At baseline walking   Global: 31 year old F w/ severe DKA w/ encephalopathy and acute respiratory failure; both resolved but now with non AG acidosis, but need to advance diet, may need supplement HCO3   Si Raider. Clinton Sawyer, MD, MBA 10/26/2012, 8:16 AM Family Medicine Resident, PGY-2 [phone] pager  Personal time exam , management  Mcarthur Rossetti. Tyson Alias, MD, FACP Pgr: [phone] [facility]

## 2012-10-27 LAB — GLUCOSE, CAPILLARY
Glucose-Capillary: 350 mg/dL — ABNORMAL HIGH (ref 70–99)
Glucose-Capillary: 345 mg/dL — ABNORMAL HIGH (ref 70–99)
Glucose-Capillary: 320 mg/dL — ABNORMAL HIGH (ref 70–99)
Glucose-Capillary: 317 mg/dL — ABNORMAL HIGH (ref 70–99)
Glucose-Capillary: 224 mg/dL — ABNORMAL HIGH (ref 70–99)
Glucose-Capillary: 195 mg/dL — ABNORMAL HIGH (ref 70–99)
Glucose-Capillary: 172 mg/dL — ABNORMAL HIGH (ref 70–99)
Glucose-Capillary: 169 mg/dL — ABNORMAL HIGH (ref 70–99)

## 2012-10-27 LAB — CBC
MCH: 29.5 pg (ref 26.0–34.0)
MCHC: 33.5 g/dL (ref 30.0–36.0)
Platelets: 213 10*3/uL (ref 150–400)

## 2012-10-27 LAB — CULTURE, RESPIRATORY W GRAM STAIN

## 2012-10-27 LAB — BLOOD GAS, ARTERIAL
Acid-base deficit: 12 mmol/L — ABNORMAL HIGH (ref 0.0–2.0)
Drawn by: 277341
O2 Saturation: 96.4 %
pO2, Arterial: 74.3 mmHg — ABNORMAL LOW (ref 80.0–100.0)

## 2012-10-27 LAB — BASIC METABOLIC PANEL
BUN: 11 mg/dL (ref 6–23)
CO2: 9 mEq/L — CL (ref 19–32)
Calcium: 8.9 mg/dL (ref 8.4–10.5)
Calcium: 8.8 mg/dL (ref 8.4–10.5)
Calcium: 8.4 mg/dL (ref 8.4–10.5)
Creatinine, Ser: 1.1 mg/dL (ref 0.50–1.10)
Creatinine, Ser: 0.95 mg/dL (ref 0.50–1.10)
GFR calc Af Amer: 76 mL/min — ABNORMAL LOW (ref >90)
GFR calc Af Amer: >90 mL/min (ref >90)
GFR calc non Af Amer: 78 mL/min — ABNORMAL LOW (ref >90)
GFR calc non Af Amer: 86 mL/min — ABNORMAL LOW (ref >90)
Potassium: 3 mEq/L — ABNORMAL LOW (ref 3.5–5.1)
Sodium: 135 mEq/L (ref 135–145)
Sodium: 137 mEq/L (ref 135–145)

## 2012-10-27 MED ORDER — POTASSIUM CHLORIDE 10 MEQ/100ML IV SOLN
10.0000 meq | INTRAVENOUS | Status: AC
  Administered 2012-10-27 (×2): 10 meq via INTRAVENOUS

## 2012-10-27 MED ORDER — SODIUM CHLORIDE 0.9 % IV SOLN
INTRAVENOUS | Status: DC
  Administered 2012-10-28: 03:00:00 via INTRAVENOUS

## 2012-10-27 MED ORDER — SODIUM CHLORIDE 0.9 % IV SOLN
INTRAVENOUS | Status: AC
  Administered 2012-10-27: 10:00:00 via INTRAVENOUS

## 2012-10-27 MED ORDER — DEXTROSE 50 % IV SOLN: 25.0000 mL | INTRAVENOUS | Status: DC | PRN

## 2012-10-27 MED ORDER — ENOXAPARIN SODIUM 40 MG/0.4ML ~~LOC~~ SOLN
40.0000 mg | SUBCUTANEOUS | Status: DC
  Administered 2012-10-27 – 2012-11-06 (×10): 40 mg via SUBCUTANEOUS
  Filled 2012-10-27 (×12): qty 0.4

## 2012-10-27 MED ORDER — SODIUM CHLORIDE 0.9 % IV SOLN
INTRAVENOUS | Status: DC
  Administered 2012-10-27: 10.8 [IU]/h via INTRAVENOUS
  Administered 2012-10-27: 2.9 [IU]/h via INTRAVENOUS
  Filled 2012-10-27 (×3): qty 1

## 2012-10-27 MED ORDER — SODIUM BICARBONATE 650 MG PO TABS
650.0000 mg | ORAL_TABLET | Freq: Two times a day (BID) | ORAL | Status: DC
  Administered 2012-10-27 – 2012-11-04 (×16): 650 mg via ORAL
  Filled 2012-10-27 (×19): qty 1

## 2012-10-27 MED ORDER — POTASSIUM CHLORIDE 10 MEQ/100ML IV SOLN
INTRAVENOUS | Status: AC
  Filled 2012-10-27: qty 100

## 2012-10-27 MED ORDER — DEXTROSE-NACL 5-0.45 % IV SOLN
INTRAVENOUS | Status: DC
  Administered 2012-10-27 (×2): via INTRAVENOUS

## 2012-10-27 NOTE — Progress Notes (Signed)
[facility] PROGRESS NOTE  Rebecca Rhodes BJY:[id number] DOB: December 27, 1980 DOA: 10/24/2012 PCP: Georganna Skeans, MD  Assessment/Plan: Principal Problem:  *DKA (diabetic ketoacidoses) Active Problems:  Leukocytosis  HTN (hypertension)  Diabetes mellitus  Acute respiratory failure     Acute respiratory failure status post extubation, respiratory status is stable  Hypertension continue ACE inhibitor, when necessary labetalol  Acute renal insufficiency creatinine went up to 1.49, now 1.10 but IV hydration  Diabetic ketoacidosis patient has recurrence off and then gap metabolic acidosis, anion gap is 22. Resume insulin drip, start DKA protocol, stat ABG, if severe anion gap metabolic acidosis and the patient will need to go back to step down  Anemia hemoglobin has come down from 16.0 10.3 monitor for bleeding  Leukocytosis initially thought to have a boil, was on vancomycin and Zosyn which have been discontinued  Metabolic acidosis, thought to be non-anion gap metabolic acidosis with anion gap is 22 today, also suspect RTA, will start the patient on sodium bicarbonate tablets  Acute metabolic encephalopathy secondary to hypertensive encephalopathy now improved  Code Status: full Family Communication: family updated about patient's clinical progress Disposition Plan:  As above    Brief narrative: Rebecca Rhodes is a 31 yo female who presented to [facility] via EMS on 11/13 with a chief complaint of shortness of breath. She was found to be in DKA with a AG of 20.1. The patient was placed on CPAP in the ER, given 2L of NS and an insulin drip was ordered. PCCM was called for admission and further management  Interim Events: Extubated, awake but drowsy, no complaints this AM  LINES / TUBES:  Left CVC IJ 11/13 >>>  Foley 11/13 >>>  oett 11/13>>> 11/15  CULTURES:  Blood x2 11/13 >>>  Urine 11/13 >>> neg  Sputum 11/13 >>>  ANTIBIOTICS:  Zosyn 11/13 >>> 11/15  Vanc 11/13 >>> 11/15    SIGNIFICANT EVENTS:  11/13: Intubated/1114- improved ph, neurostatus  11/4 Extubated, gap closed      HPI/Subjective: Shortness of breath this morning  Objective: Filed Vitals:   10/26/12 2107 10/27/12 0009 10/27/12 0213 10/27/12 0613  BP: 138/73  130/69 107/73  Pulse: 107 109 110 110  Temp: 98.4 F (36.9 C)  99 F (37.2 C) 98.7 F (37.1 C)  TempSrc: Oral  Oral Oral  Resp: 18 20 20 18   Height:      Weight:      SpO2: 100% 100% 98% 100%    Intake/Output Summary (Last 24 hours) at 10/27/12 0944 Last data filed at 10/26/12 2200  Gross per 24 hour  Intake  892.5 ml  Output      0 ml  Net  892.5 ml    Exam:  General: well appearing, pleasant and conversant  Neuro: alert and oriented x 4, no focal deficits  HEENT: Neck supple, no masses or lumps noted  Cardiovascular: Regular rate, rapid rhythm, +4 noted in all ext, no edema noted. Hypertensive on the monitor  Lungs: Clear to auscultation bilaterally  Abdomen: Soft, distended, nontender  Musculoskeletal: Intact, moves all ext  Skin: warm, dry    Data Reviewed: Basic Metabolic Panel:  Lab 10/27/12 0865 10/26/12 1912 10/26/12 0554 10/25/12 1845 10/25/12 0635  NA 137 136 136 138 143  K 4.0 3.6 3.2* 3.2* 4.2  CL 106 107 109 111 115*  CO2 9* 14* 16* 17* 20  GLUCOSE 344* 316* 274* 274* 131*  BUN 13 11 10 12 14   CREATININE 1.10 1.07 1.13* 1.30* 1.49*  CALCIUM  8.9 8.6 8.3* 8.4 8.7  MG -- -- -- -- --  PHOS -- -- -- -- --    Liver Function Tests:  Lab 10/24/12 1109  AST 11  ALT <5  ALKPHOS 113  BILITOT 0.2*  PROT 7.9  ALBUMIN 3.0*    Lab 10/24/12 1137  LIPASE 46  AMYLASE --   No results found for this basename: AMMONIA:5 in the last 168 hours  CBC:  Lab 10/26/12 0554 10/25/12 0440 10/24/12 0822 10/24/12 0802  WBC 12.6* 15.9* -- 28.1*  NEUTROABS -- -- -- 21.3*  HGB 10.3* 11.5* 16.0* 13.9  HCT 31.2* 34.5* 47.0* 42.0  MCV 86.9 86.3 -- 91.3  PLT 206 211 -- 274    Cardiac Enzymes:  Lab 10/24/12  2000 10/24/12 1349 10/24/12 0806  CKTOTAL -- -- --  CKMB -- -- --  CKMBINDEX -- -- --  TROPONINI <0.30 <0.30 <0.30   BNP (last 3 results) No results found for this basename: PROBNP:3 in the last 8760 hours   CBG:  Lab 10/26/12 2216 10/26/12 1709 10/26/12 1138 10/26/12 0830 10/26/12 0414  GLUCAP 336* 317* 331* 261* 228*    Recent Results (from the past 240 hour(s))  URINE CULTURE     Status: Normal   Collection Time   10/24/12  8:47 AM      Component Value Range Status Comment   Specimen Description URINE, RANDOM   Final    Special Requests ADDED AT 0847 ON 0852   Final    Culture  Setup Time 10/24/2012 17:22   Final    Colony Count NO GROWTH   Final    Culture NO GROWTH   Final    Report Status 10/25/2012 FINAL   Final   CULTURE, BLOOD (ROUTINE X 2)     Status: Normal (Preliminary result)   Collection Time   10/24/12 11:18 AM      Component Value Range Status Comment   Specimen Description BLOOD ARM RIGHT   Final    Special Requests BOTTLES DRAWN AEROBIC AND ANAEROBIC 10CC   Final    Culture  Setup Time 10/24/2012 18:55   Final    Culture     Final    Value:        BLOOD CULTURE RECEIVED NO GROWTH TO DATE CULTURE WILL BE HELD FOR 5 DAYS BEFORE ISSUING A FINAL NEGATIVE REPORT   Report Status PENDING   Incomplete   CULTURE, BLOOD (ROUTINE X 2)     Status: Normal (Preliminary result)   Collection Time   10/24/12 11:25 AM      Component Value Range Status Comment   Specimen Description BLOOD HAND RIGHT   Final    Special Requests BOTTLES DRAWN AEROBIC AND ANAEROBIC 10CC   Final    Culture  Setup Time 10/24/2012 18:55   Final    Culture     Final    Value:        BLOOD CULTURE RECEIVED NO GROWTH TO DATE CULTURE WILL BE HELD FOR 5 DAYS BEFORE ISSUING A FINAL NEGATIVE REPORT   Report Status PENDING   Incomplete   MRSA PCR SCREENING     Status: Normal   Collection Time   10/24/12 11:52 AM      Component Value Range Status Comment   MRSA by PCR NEGATIVE  NEGATIVE Final     CULTURE, RESPIRATORY     Status: Normal (Preliminary result)   Collection Time   10/25/12 10:04 AM  Component Value Range Status Comment   Specimen Description TRACHEAL ASPIRATE   Final    Special Requests NONE   Final    Gram Stain     Final    Value: ABUNDANT WBC PRESENT, PREDOMINANTLY PMN     NO SQUAMOUS EPITHELIAL CELLS SEEN     RARE GRAM POSITIVE COCCI IN PAIRS   Culture Culture reincubated for better growth   Final    Report Status PENDING   Incomplete      Studies: Dg Chest Portable 1 View  10/24/2012  *RADIOLOGY REPORT*  Clinical Data: ETT placement  PORTABLE CHEST - 1 VIEW  Comparison: 10/24/2012 at 0757 hours  Findings: Endotracheal tube terminates 5 cm above the carina.  Left IJ venous catheter terminates at/just above the cavoatrial junction.  Lungs are essentially clear.  No pleural effusion or pneumothorax.  The heart is normal in size.  IMPRESSION: Endotracheal tube terminates 5 cm above the carina.  Left IJ venous catheter terminates at/just above the cavoatrial junction.  No pneumothorax.   Original Report Authenticated By: Charline Bills, M.D.    Dg Chest Port 1 View  10/24/2012  *RADIOLOGY REPORT*  Clinical Data: Shortness of breath.  PORTABLE CHEST - 1 VIEW  Comparison: 06/05/2012  Findings: The heart size and pulmonary vascularity are normal and the lungs are clear.  No osseous abnormality.  IMPRESSION: Normal chest.   Original Report Authenticated By: Francene Boyers, M.D.     Scheduled Meds:   . antiseptic oral rinse  15 mL Mouth Rinse QID  . chlorhexidine  15 mL Mouth/Throat BID  . enoxaparin  40 mg Subcutaneous Q24H  . fentaNYL  200 mcg Intravenous Once  . fluconazole (DIFLUCAN) IV  200 mg Intravenous Q24H  . influenza  inactive virus vaccine  0.5 mL Intramuscular Tomorrow-1000  . labetalol  100 mg Oral BID  . lisinopril  10 mg Oral Daily  . pantoprazole  40 mg Oral Daily  . pneumococcal 23 valent vaccine  0.5 mL Intramuscular Tomorrow-1000  .  potassium chloride  10 mEq Intravenous Q1H  . [COMPLETED] potassium chloride  40 mEq Oral BID  . [DISCONTINUED] heparin  5,000 Units Subcutaneous Q8H  . [DISCONTINUED] insulin aspart  0-15 Units Subcutaneous Q4H  . [DISCONTINUED] insulin aspart  0-15 Units Subcutaneous TID AC & HS  . [DISCONTINUED] insulin glargine  15 Units Subcutaneous QHS  . [DISCONTINUED] midazolam  4 mg Intravenous Once  . [DISCONTINUED] piperacillin-tazobactam (ZOSYN)  IV  3.375 g Intravenous Q8H  . [DISCONTINUED] vancomycin  1,250 mg Intravenous Q12H   Continuous Infusions:   . sodium chloride    . sodium chloride    . dextrose 5 % and 0.45% NaCl    . insulin (NOVOLIN-R) infusion      Principal Problem:  *DKA (diabetic ketoacidoses) Active Problems:  Leukocytosis  HTN (hypertension)  Diabetes mellitus  Acute respiratory failure    Time spent: 60 minutes   [facility]  [facility] Pager [phone]. If 8PM-8AM, please contact night-coverage at [email or web address], password [facility] 10/27/2012, 9:44 AM  LOS: 3 days

## 2012-10-28 LAB — BASIC METABOLIC PANEL
BUN: 10 mg/dL (ref 6–23)
Calcium: 8.4 mg/dL (ref 8.4–10.5)
Calcium: 8.1 mg/dL — ABNORMAL LOW (ref 8.4–10.5)
Chloride: 110 mEq/L (ref 96–112)
Creatinine, Ser: 0.9 mg/dL (ref 0.50–1.10)
Creatinine, Ser: 0.87 mg/dL (ref 0.50–1.10)
Creatinine, Ser: 1.01 mg/dL (ref 0.50–1.10)
GFR calc Af Amer: >90 mL/min (ref >90)
GFR calc Af Amer: 84 mL/min — ABNORMAL LOW (ref >90)
GFR calc non Af Amer: 84 mL/min — ABNORMAL LOW (ref >90)
GFR calc non Af Amer: 87 mL/min — ABNORMAL LOW (ref >90)
GFR calc non Af Amer: 73 mL/min — ABNORMAL LOW (ref >90)
Potassium: 3.3 mEq/L — ABNORMAL LOW (ref 3.5–5.1)
Sodium: 136 mEq/L (ref 135–145)
Sodium: 134 mEq/L — ABNORMAL LOW (ref 135–145)

## 2012-10-28 LAB — GLUCOSE, CAPILLARY
Glucose-Capillary: 144 mg/dL — ABNORMAL HIGH (ref 70–99)
Glucose-Capillary: 143 mg/dL — ABNORMAL HIGH (ref 70–99)
Glucose-Capillary: 136 mg/dL — ABNORMAL HIGH (ref 70–99)
Glucose-Capillary: 153 mg/dL — ABNORMAL HIGH (ref 70–99)
Glucose-Capillary: 159 mg/dL — ABNORMAL HIGH (ref 70–99)
Glucose-Capillary: 152 mg/dL — ABNORMAL HIGH (ref 70–99)
Glucose-Capillary: 165 mg/dL — ABNORMAL HIGH (ref 70–99)
Glucose-Capillary: 303 mg/dL — ABNORMAL HIGH (ref 70–99)

## 2012-10-28 LAB — CBC
Platelets: 199 10*3/uL (ref 150–400)
RBC: 3.35 MIL/uL — ABNORMAL LOW (ref 3.87–5.11)
RDW: 14.8 % (ref 11.5–15.5)
WBC: 13.6 10*3/uL — ABNORMAL HIGH (ref 4.0–10.5)

## 2012-10-28 LAB — MAGNESIUM: Magnesium: 1.8 mg/dL (ref 1.5–2.5)

## 2012-10-28 MED ORDER — POTASSIUM CHLORIDE 10 MEQ/100ML IV SOLN
10.0000 meq | INTRAVENOUS | Status: AC
  Administered 2012-10-28 (×3): 10 meq via INTRAVENOUS
  Filled 2012-10-28 (×2): qty 100

## 2012-10-28 MED ORDER — INSULIN ASPART PROT & ASPART (70-30 MIX) 100 UNIT/ML ~~LOC~~ SUSP
15.0000 [IU] | Freq: Two times a day (BID) | SUBCUTANEOUS | Status: DC
  Administered 2012-10-28 – 2012-10-29 (×3): 15 [IU] via SUBCUTANEOUS
  Filled 2012-10-28: qty 3

## 2012-10-28 MED ORDER — POTASSIUM CHLORIDE IN NACL 40-0.9 MEQ/L-% IV SOLN
INTRAVENOUS | Status: DC
  Administered 2012-10-28 – 2012-10-29 (×2): via INTRAVENOUS
  Administered 2012-10-31: 75 mL/h via INTRAVENOUS
  Filled 2012-10-28 (×11): qty 1000

## 2012-10-28 MED ORDER — POTASSIUM CHLORIDE CRYS ER 20 MEQ PO TBCR
40.0000 meq | EXTENDED_RELEASE_TABLET | Freq: Two times a day (BID) | ORAL | Status: DC
  Administered 2012-10-28 – 2012-11-06 (×18): 40 meq via ORAL
  Filled 2012-10-28 (×21): qty 2

## 2012-10-28 MED ORDER — INSULIN ASPART 100 UNIT/ML ~~LOC~~ SOLN
0.0000 [IU] | Freq: Three times a day (TID) | SUBCUTANEOUS | Status: DC
  Administered 2012-10-28: 17:00:00 via SUBCUTANEOUS
  Administered 2012-10-29: 15 [IU] via SUBCUTANEOUS
  Administered 2012-10-29: 7 [IU] via SUBCUTANEOUS
  Administered 2012-10-29 – 2012-10-30 (×3): 11 [IU] via SUBCUTANEOUS
  Administered 2012-10-30 – 2012-11-02 (×5): 7 [IU] via SUBCUTANEOUS
  Administered 2012-11-02 (×2): 3 [IU] via SUBCUTANEOUS
  Administered 2012-11-03: 4 [IU] via SUBCUTANEOUS
  Administered 2012-11-03: 3 [IU] via SUBCUTANEOUS
  Administered 2012-11-04 – 2012-11-05 (×2): 4 [IU] via SUBCUTANEOUS
  Administered 2012-11-05 – 2012-11-06 (×2): 3 [IU] via SUBCUTANEOUS

## 2012-10-28 MED ORDER — PIPERACILLIN-TAZOBACTAM 3.375 G IVPB
3.3750 g | Freq: Three times a day (TID) | INTRAVENOUS | Status: DC
  Administered 2012-10-28 – 2012-11-03 (×17): 3.375 g via INTRAVENOUS
  Filled 2012-10-28 (×21): qty 50

## 2012-10-28 MED ORDER — POTASSIUM CHLORIDE 10 MEQ/100ML IV SOLN
INTRAVENOUS | Status: AC
  Filled 2012-10-28: qty 100

## 2012-10-28 MED ORDER — POTASSIUM CHLORIDE 10 MEQ/100ML IV SOLN
INTRAVENOUS | Status: AC
  Administered 2012-10-28: 10 meq
  Filled 2012-10-28: qty 100

## 2012-10-28 MED ORDER — VANCOMYCIN HCL 1000 MG IV SOLR
1250.0000 mg | Freq: Two times a day (BID) | INTRAVENOUS | Status: DC
  Administered 2012-10-28 – 2012-11-03 (×11): 1250 mg via INTRAVENOUS
  Filled 2012-10-28 (×14): qty 1250

## 2012-10-28 MED ORDER — INSULIN ASPART PROT & ASPART (70-30 MIX) 100 UNIT/ML ~~LOC~~ SUSP
15.0000 [IU] | Freq: Two times a day (BID) | SUBCUTANEOUS | Status: DC
  Filled 2012-10-28: qty 3

## 2012-10-28 NOTE — Progress Notes (Signed)
Patient is not ready to wear Cpap at this time

## 2012-10-28 NOTE — Progress Notes (Signed)
ANTIBIOTIC CONSULT NOTE - INITIAL  Pharmacy Consult for Vancomycin + Zosyn Indication: Cellulitis  No Known Allergies  Patient Measurements: Height: 5' 4.17" (163 cm) (transcribed from 6/13) Weight: 201 lb 15.1 oz (91.6 kg) IBW/kg (Calculated) : 55.1   Vital Signs: Temp: 99.1 F (37.3 C) (11/17 1127) Temp src: Oral (11/17 1127) BP: 107/69 mmHg (11/17 1127) Pulse Rate: 100  (11/17 1127) Intake/Output from previous day: 11/16 0701 - 11/17 0700 In: 1530 [P.O.:240; I.V.:990; IV Piggyback:300] Out: -  Intake/Output from this shift:    Labs:  Basename 10/28/12 0855 10/28/12 0242 10/27/12 2045 10/27/12 0926 10/26/12 0554  WBC -- 13.6* -- 13.2* 12.6*  HGB -- 9.7* -- 10.4* 10.3*  PLT -- 199 -- 213 206  LABCREA -- -- -- -- --  CREATININE 0.87 0.90 0.88 -- --   Estimated Creatinine Clearance: 102.1 ml/min (by C-G formula based on Cr of 0.87). No results found for this basename: VANCOTROUGH:2,VANCOPEAK:2,VANCORANDOM:2,GENTTROUGH:2,GENTPEAK:2,GENTRANDOM:2,TOBRATROUGH:2,TOBRAPEAK:2,TOBRARND:2,AMIKACINPEAK:2,AMIKACINTROU:2,AMIKACIN:2, in the last 72 hours   Microbiology: Recent Results (from the past 720 hour(s))  URINE CULTURE     Status: Normal   Collection Time   10/24/12  8:47 AM      Component Value Range Status Comment   Specimen Description URINE, RANDOM   Final    Special Requests ADDED AT 0847 ON 0852   Final    Culture  Setup Time 10/24/2012 17:22   Final    Colony Count NO GROWTH   Final    Culture NO GROWTH   Final    Report Status 10/25/2012 FINAL   Final   CULTURE, BLOOD (ROUTINE X 2)     Status: Normal (Preliminary result)   Collection Time   10/24/12 11:18 AM      Component Value Range Status Comment   Specimen Description BLOOD ARM RIGHT   Final    Special Requests BOTTLES DRAWN AEROBIC AND ANAEROBIC 10CC   Final    Culture  Setup Time 10/24/2012 18:55   Final    Culture     Final    Value:        BLOOD CULTURE RECEIVED NO GROWTH TO DATE CULTURE WILL BE HELD  FOR 5 DAYS BEFORE ISSUING A FINAL NEGATIVE REPORT   Report Status PENDING   Incomplete   CULTURE, BLOOD (ROUTINE X 2)     Status: Normal (Preliminary result)   Collection Time   10/24/12 11:25 AM      Component Value Range Status Comment   Specimen Description BLOOD HAND RIGHT   Final    Special Requests BOTTLES DRAWN AEROBIC AND ANAEROBIC 10CC   Final    Culture  Setup Time 10/24/2012 18:55   Final    Culture     Final    Value:        BLOOD CULTURE RECEIVED NO GROWTH TO DATE CULTURE WILL BE HELD FOR 5 DAYS BEFORE ISSUING A FINAL NEGATIVE REPORT   Report Status PENDING   Incomplete   MRSA PCR SCREENING     Status: Normal   Collection Time   10/24/12 11:52 AM      Component Value Range Status Comment   MRSA by PCR NEGATIVE  NEGATIVE Final   CULTURE, RESPIRATORY     Status: Normal   Collection Time   10/25/12 10:04 AM      Component Value Range Status Comment   Specimen Description TRACHEAL ASPIRATE   Final    Special Requests NONE   Final    Gram Stain  Final    Value: ABUNDANT WBC PRESENT, PREDOMINANTLY PMN     NO SQUAMOUS EPITHELIAL CELLS SEEN     RARE GRAM POSITIVE COCCI IN PAIRS   Culture Non-Pathogenic Oropharyngeal-type Flora Isolated.   Final    Report Status 10/27/2012 FINAL   Final     Medical History: Past Medical History  Diagnosis Date  . Diabetes mellitus   . Hypertension   . HTN (hypertension) 06/04/2012  . Hyperlipidemia 06/04/2012  . Diabetes mellitus 06/04/2012    Assessment: 31 y.o. F to resume Vancomycin + Zosyn (noted to be on earlier this admission 11/13 >> 11/15) for empiric cellulitis coverage. Tmax/24h: 99.1, WBC 13.6, SCr 0.87, CrCl~90-100 ml/min.   Goal of Therapy:  Vancomycin trough level 10-15 mcg/ml  Plan:  1. Vancomycin 1250 mg IV every 12 hours 2. Zosyn 3.375g IV every 8 hours 3. Will continue to follow renal function, culture results, LOT, and antibiotic de-escalation plans   Georgina Pillion, PharmD, BCPS Clinical  Pharmacist Pager: [phone] 10/28/2012 11:36 AM

## 2012-10-28 NOTE — Progress Notes (Addendum)
[facility] PROGRESS NOTE  Rebecca Rhodes WUJ:[id number] DOB: 1981-08-26 DOA: 10/24/2012 PCP: Georganna Skeans, MD  Assessment/Plan: Principal Problem:  *DKA (diabetic ketoacidoses) Active Problems:  Leukocytosis  HTN (hypertension)  Diabetes mellitus  Acute respiratory failure    Acute respiratory failure status post extubation, respiratory status is stable  Hypertension continue ACE inhibitor, when necessary labetalol  Acute renal insufficiency creatinine went up to 1.49, now 1.10 but IV hydration   Diabetic ketoacidosis patient has recurrence of her DKA yesterday and anio gap metabolic acidosis, anion gap is 22. Anion gap significantly improve, 11 this morning. Will transitioned to NPH and sliding scale insulin today  Anemia hemoglobin has come down from 16.0 10.3 monitor for bleeding   Leukocytosis initially thought to have a boil, was on vancomycin and Zosyn which have been resumed as cellulitis appears to be still present  Metabolic acidosis, thought to be non-anion gap metabolic acidosis with anion gap is 22 today, also suspect RTA, will start the patient on sodium bicarbonate tablets   Acute metabolic encephalopathy secondary to hypertensive encephalopathy now improved   Hypokalemia replete  Code Status: full  Family Communication: family updated about patient's clinical progress  Disposition Plan: As above   Brief narrative:  Rebecca Rhodes is a 31 yo female who presented to [facility] via EMS on 11/13 with a chief complaint of shortness of breath. She was found to be in DKA with a AG of 20.1. The patient was placed on CPAP in the ER, given 2L of NS and an insulin drip was ordered. PCCM was called for admission and further management  Interim Events: Extubated, awake but drowsy, no complaints this AM    LINES / TUBES:  Left CVC IJ 11/13 >>>  Foley 11/13 >>>  oett 11/13>>> 11/15  CULTURES:  Blood x2 11/13 >>>  Urine 11/13 >>> neg  Sputum 11/13 >>>  ANTIBIOTICS:    Zosyn 11/13 >>> 11/15  Vanc 11/13 >>> 11/15  SIGNIFICANT EVENTS:  11/13: Intubated/1114- improved ph, neurostatus  11/4 Extubated, gap closed    HPI/Subjective:  Shortness of breath this morning  Objective: Filed Vitals:   10/27/12 2300 10/28/12 0142 10/28/12 0635 10/28/12 0954  BP:  110/63 105/54 111/68  Pulse: 100 97 95 100  Temp:  97.5 F (36.4 C) 98.9 F (37.2 C)   TempSrc:  Oral Oral   Resp: 20 18 16    Height:      Weight:      SpO2: 99% 100% 100%     Intake/Output Summary (Last 24 hours) at 10/28/12 1005 Last data filed at 10/27/12 1812  Gross per 24 hour  Intake   1290 ml  Output      0 ml  Net   1290 ml    Exam:  HENT:  Head: Atraumatic.  Nose: Nose normal.  Mouth/Throat: Oropharynx is clear and moist.  Eyes: Conjunctivae are normal. Pupils are equal, round, and reactive to light. No scleral icterus.  Neck: Neck supple. No tracheal deviation present.  Cardiovascular: Normal rate, regular rhythm, normal heart sounds and intact distal pulses.  Pulmonary/Chest: Effort normal and breath sounds normal. No respiratory distress.  Abdominal: Soft. Normal appearance and bowel sounds are normal. She exhibits no distension. There is no tenderness.  Musculoskeletal: She exhibits no edema and no tenderness.  Neurological: She is alert. No cranial nerve deficit.    Data Reviewed: Basic Metabolic Panel:  Lab 10/28/12 9562 10/27/12 2045 10/27/12 1443 10/27/12 0720 10/26/12 1912  NA 136 137 135 137 136  K 2.8* 3.0* 3.3* 4.0 3.6  CL 108 108 108 106 107  CO2 17* 17* 17* 9* 14*  GLUCOSE 144* 179* 293* 344* 316*  BUN 11 11 12 13 11   CREATININE 0.90 0.88 0.95 1.10 1.07  CALCIUM 8.4 8.4 8.8 8.9 8.6  MG -- -- -- -- --  PHOS -- -- -- -- --    Liver Function Tests:  Lab 10/24/12 1109  AST 11  ALT <5  ALKPHOS 113  BILITOT 0.2*  PROT 7.9  ALBUMIN 3.0*    Lab 10/24/12 1137  LIPASE 46  AMYLASE --   No results found for this basename: AMMONIA:5 in the last  168 hours  CBC:  Lab 10/28/12 0242 10/27/12 0926 10/26/12 0554 10/25/12 0440 10/24/12 0822 10/24/12 0802  WBC 13.6* 13.2* 12.6* 15.9* -- 28.1*  NEUTROABS -- -- -- -- -- 21.3*  HGB 9.7* 10.4* 10.3* 11.5* 16.0* --  HCT 28.8* 31.0* 31.2* 34.5* 47.0* --  MCV 86.0 88.1 86.9 86.3 -- 91.3  PLT 199 213 206 211 -- 274    Cardiac Enzymes:  Lab 10/24/12 2000 10/24/12 1349 10/24/12 0806  CKTOTAL -- -- --  CKMB -- -- --  CKMBINDEX -- -- --  TROPONINI <0.30 <0.30 <0.30   BNP (last 3 results) No results found for this basename: PROBNP:3 in the last 8760 hours   CBG:  Lab 10/28/12 0948 10/28/12 0832 10/28/12 0716 10/28/12 0601 10/28/12 0456  GLUCAP 159* 153* 137* 136* 143*    Recent Results (from the past 240 hour(s))  URINE CULTURE     Status: Normal   Collection Time   10/24/12  8:47 AM      Component Value Range Status Comment   Specimen Description URINE, RANDOM   Final    Special Requests ADDED AT 0847 ON 0852   Final    Culture  Setup Time 10/24/2012 17:22   Final    Colony Count NO GROWTH   Final    Culture NO GROWTH   Final    Report Status 10/25/2012 FINAL   Final   CULTURE, BLOOD (ROUTINE X 2)     Status: Normal (Preliminary result)   Collection Time   10/24/12 11:18 AM      Component Value Range Status Comment   Specimen Description BLOOD ARM RIGHT   Final    Special Requests BOTTLES DRAWN AEROBIC AND ANAEROBIC 10CC   Final    Culture  Setup Time 10/24/2012 18:55   Final    Culture     Final    Value:        BLOOD CULTURE RECEIVED NO GROWTH TO DATE CULTURE WILL BE HELD FOR 5 DAYS BEFORE ISSUING A FINAL NEGATIVE REPORT   Report Status PENDING   Incomplete   CULTURE, BLOOD (ROUTINE X 2)     Status: Normal (Preliminary result)   Collection Time   10/24/12 11:25 AM      Component Value Range Status Comment   Specimen Description BLOOD HAND RIGHT   Final    Special Requests BOTTLES DRAWN AEROBIC AND ANAEROBIC 10CC   Final    Culture  Setup Time 10/24/2012 18:55   Final     Culture     Final    Value:        BLOOD CULTURE RECEIVED NO GROWTH TO DATE CULTURE WILL BE HELD FOR 5 DAYS BEFORE ISSUING A FINAL NEGATIVE REPORT   Report Status PENDING   Incomplete   MRSA PCR SCREENING  Status: Normal   Collection Time   10/24/12 11:52 AM      Component Value Range Status Comment   MRSA by PCR NEGATIVE  NEGATIVE Final   CULTURE, RESPIRATORY     Status: Normal   Collection Time   10/25/12 10:04 AM      Component Value Range Status Comment   Specimen Description TRACHEAL ASPIRATE   Final    Special Requests NONE   Final    Gram Stain     Final    Value: ABUNDANT WBC PRESENT, PREDOMINANTLY PMN     NO SQUAMOUS EPITHELIAL CELLS SEEN     RARE GRAM POSITIVE COCCI IN PAIRS   Culture Non-Pathogenic Oropharyngeal-type Flora Isolated.   Final    Report Status 10/27/2012 FINAL   Final      Studies: Dg Chest Portable 1 View  10/24/2012  *RADIOLOGY REPORT*  Clinical Data: ETT placement  PORTABLE CHEST - 1 VIEW  Comparison: 10/24/2012 at 0757 hours  Findings: Endotracheal tube terminates 5 cm above the carina.  Left IJ venous catheter terminates at/just above the cavoatrial junction.  Lungs are essentially clear.  No pleural effusion or pneumothorax.  The heart is normal in size.  IMPRESSION: Endotracheal tube terminates 5 cm above the carina.  Left IJ venous catheter terminates at/just above the cavoatrial junction.  No pneumothorax.   Original Report Authenticated By: Charline Bills, M.D.    Dg Chest Port 1 View  10/24/2012  *RADIOLOGY REPORT*  Clinical Data: Shortness of breath.  PORTABLE CHEST - 1 VIEW  Comparison: 06/05/2012  Findings: The heart size and pulmonary vascularity are normal and the lungs are clear.  No osseous abnormality.  IMPRESSION: Normal chest.   Original Report Authenticated By: Francene Boyers, M.D.     Scheduled Meds:   . antiseptic oral rinse  15 mL Mouth Rinse QID  . chlorhexidine  15 mL Mouth/Throat BID  . enoxaparin  40 mg Subcutaneous  Q24H  . fentaNYL  200 mcg Intravenous Once  . [COMPLETED] fluconazole (DIFLUCAN) IV  200 mg Intravenous Q24H  . [COMPLETED] influenza  inactive virus vaccine  0.5 mL Intramuscular Tomorrow-1000  . insulin aspart  0-20 Units Subcutaneous TID WC  . labetalol  100 mg Oral BID  . lisinopril  10 mg Oral Daily  . pantoprazole  40 mg Oral Daily  . [COMPLETED] pneumococcal 23 valent vaccine  0.5 mL Intramuscular Tomorrow-1000  . [COMPLETED] potassium chloride  10 mEq Intravenous Q1H  . [EXPIRED] potassium chloride  10 mEq Intravenous Q1 Hr x 4  . [EXPIRED] potassium chloride      . potassium chloride      . [COMPLETED] potassium chloride      . potassium chloride  40 mEq Oral BID  . sodium bicarbonate  650 mg Oral BID   Continuous Infusions:   . [EXPIRED] sodium chloride 150 mL/hr at 10/27/12 1010  . 0.9 % NaCl with KCl 40 mEq / L    . insulin (NOVOLIN-R) infusion 10.8 Units/hr (10/27/12 1942)  . [DISCONTINUED] sodium chloride 150 mL/hr at 10/28/12 0257  . [DISCONTINUED] dextrose 5 % and 0.45% NaCl 125 mL/hr at 10/27/12 1812    Principal Problem:  *DKA (diabetic ketoacidoses) Active Problems:  Leukocytosis  HTN (hypertension)  Diabetes mellitus  Acute respiratory failure    Time spent: 40 minutes   [facility]  [facility] Pager [phone]. If 8PM-8AM, please contact night-coverage at [email or web address], password [facility] 10/28/2012, 10:05 AM  LOS: 4 days

## 2012-10-29 ENCOUNTER — Inpatient Hospital Stay (HOSPITAL_COMMUNITY): Payer: MEDICAID

## 2012-10-29 LAB — GLUCOSE, CAPILLARY: Glucose-Capillary: 279 mg/dL — ABNORMAL HIGH (ref 70–99)

## 2012-10-29 MED ORDER — IOHEXOL 300 MG/ML  SOLN
100.0000 mL | Freq: Once | INTRAMUSCULAR | Status: AC | PRN
  Administered 2012-10-29: 100 mL via INTRAVENOUS

## 2012-10-29 MED ORDER — INSULIN ASPART PROT & ASPART (70-30 MIX) 100 UNIT/ML ~~LOC~~ SUSP
25.0000 [IU] | Freq: Two times a day (BID) | SUBCUTANEOUS | Status: DC
  Administered 2012-10-29 (×2): 25 [IU] via SUBCUTANEOUS
  Filled 2012-10-29: qty 3

## 2012-10-29 NOTE — Progress Notes (Signed)
PT Cancellation Note  Patient Details Name: Rebecca Rhodes MRN: [id number] DOB: 1981-06-23   Cancelled Treatment:     Pt at CT; will try to see tomorrow.   Fabio Asa 10/29/2012, 3:45 PM

## 2012-10-29 NOTE — Progress Notes (Signed)
[facility] PROGRESS NOTE  Rebecca Rhodes ZOX:[id number] DOB: 10-03-1981 DOA: 10/24/2012 PCP: Georganna Skeans, MD  Assessment/Plan: Principal Problem:  *DKA (diabetic ketoacidoses) Active Problems:  Leukocytosis  HTN (hypertension)  Diabetes mellitus  Acute respiratory failure   Acute respiratory failure status post extubation, respiratory status is stable  Hypertension continue ACE inhibitor, when necessary labetalol  Acute renal insufficiency creatinine went up to 1.49, now 1.10 but IV hydration   Diabetic ketoacidosis patient has recurrence of her DKA yesterday and anio gap metabolic acidosis, anion gap is 22. Anion gap significantly improve, 11 this morning. Was transitioned to NPH and sliding scale insulin , increase dose of NPH to 25 units twice a day  Anemia hemoglobin has come down from 16.0 10.3 monitor for bleeding   Leukocytosis initially thought to have a boil, was on vancomycin and Zosyn which have been resumed as cellulitis appears to be still present , we'll obtain a CT scan to rule out abscess number follow Standen count   Metabolic acidosis, suspected RTA, will continue the patient on sodium bicarbonate tablets   Acute metabolic encephalopathy secondary to hypertensive encephalopathy now improved  Hypokalemia replete    Code Status: full  Family Communication: family updated about patient's clinical progress  Disposition Plan: As above    Brief narrative:  Rebecca Rhodes is a 31 yo female who presented to [facility] via EMS on 11/13 with a chief complaint of shortness of breath. She was found to be in DKA with a AG of 20.1. The patient was placed on CPAP in the ER, given 2L of NS and an insulin drip was ordered. PCCM was called for admission and further management  Interim Events: Extubated, awake but drowsy, no complaints this AM  LINES / TUBES:  Left CVC IJ 11/13 >>>  Foley 11/13 >>>  oett 11/13>>> 11/15  CULTURES:  Blood x2 11/13 >>>  Urine 11/13 >>> neg    Sputum 11/13 >>>  ANTIBIOTICS:  Zosyn 11/13 >>> 11/15  Vanc 11/13 >>> 11/15  SIGNIFICANT EVENTS:  11/13: Intubated/1114- improved ph, neurostatus  11/4 Extubated, gap closed    HPI/Subjective:  Leg  pain from her from her folliculitis   Objective: Filed Vitals:   10/29/12 0100 10/29/12 0157 10/29/12 0556 10/29/12 1012  BP:  120/82 113/57 114/63  Pulse:  107 98 107  Temp: 101 F (38.3 C) 101.7 F (38.7 C) 99.3 F (37.4 C) 99.1 F (37.3 C)  TempSrc:  Oral Oral Oral  Resp:  18 16 20   Height:      Weight:      SpO2:  99% 100% 100%    Intake/Output Summary (Last 24 hours) at 10/29/12 1153 Last data filed at 10/29/12 1013  Gross per 24 hour  Intake    240 ml  Output      0 ml  Net    240 ml    Exam:  HENT:  Head: Atraumatic.  Nose: Nose normal.  Mouth/Throat: Oropharynx is clear and moist.  Eyes: Conjunctivae are normal. Pupils are equal, round, and reactive to light. No scleral icterus.  Neck: Neck supple. No tracheal deviation present.  Cardiovascular: Normal rate, regular rhythm, normal heart sounds and intact distal pulses.  Pulmonary/Chest: Effort normal and breath sounds normal. No respiratory distress.  Abdominal: Soft. Normal appearance and bowel sounds are normal. She exhibits no distension. There is no tenderness.  Musculoskeletal: She exhibits no edema and no tenderness.  Neurological: She is alert. No cranial nerve deficit.    Data Reviewed: Basic  Metabolic Panel:  Lab 10/28/12 0093 10/28/12 0855 10/28/12 0242 10/27/12 2045 10/27/12 1443  NA 134* 139 136 137 135  K 3.7 3.3* 2.8* 3.0* 3.3*  CL 107 110 108 108 108  CO2 16* 19 17* 17* 17*  GLUCOSE 301* 158* 144* 179* 293*  BUN 11 10 11 11 12   CREATININE 1.01 0.87 0.90 0.88 0.95  CALCIUM 8.1* 8.3* 8.4 8.4 8.8  MG -- 1.8 -- -- --  PHOS -- -- -- -- --    Liver Function Tests:  Lab 10/24/12 1109  AST 11  ALT <5  ALKPHOS 113  BILITOT 0.2*  PROT 7.9  ALBUMIN 3.0*    Lab 10/24/12 1137   LIPASE 46  AMYLASE --   No results found for this basename: AMMONIA:5 in the last 168 hours  CBC:  Lab 10/28/12 0242 10/27/12 0926 10/26/12 0554 10/25/12 0440 10/24/12 0822 10/24/12 0802  WBC 13.6* 13.2* 12.6* 15.9* -- 28.1*  NEUTROABS -- -- -- -- -- 21.3*  HGB 9.7* 10.4* 10.3* 11.5* 16.0* --  HCT 28.8* 31.0* 31.2* 34.5* 47.0* --  MCV 86.0 88.1 86.9 86.3 -- 91.3  PLT 199 213 206 211 -- 274    Cardiac Enzymes:  Lab 10/24/12 2000 10/24/12 1349 10/24/12 0806  CKTOTAL -- -- --  CKMB -- -- --  CKMBINDEX -- -- --  TROPONINI <0.30 <0.30 <0.30   BNP (last 3 results) No results found for this basename: PROBNP:3 in the last 8760 hours   CBG:  Lab 10/29/12 0755 10/28/12 2215 10/28/12 1715 10/28/12 1326 10/28/12 1217  GLUCAP 279* 303* 193* 165* 152*    Recent Results (from the past 240 hour(s))  URINE CULTURE     Status: Normal   Collection Time   10/24/12  8:47 AM      Component Value Range Status Comment   Specimen Description URINE, RANDOM   Final    Special Requests ADDED AT 0847 ON 0852   Final    Culture  Setup Time 10/24/2012 17:22   Final    Colony Count NO GROWTH   Final    Culture NO GROWTH   Final    Report Status 10/25/2012 FINAL   Final   CULTURE, BLOOD (ROUTINE X 2)     Status: Normal (Preliminary result)   Collection Time   10/24/12 11:18 AM      Component Value Range Status Comment   Specimen Description BLOOD ARM RIGHT   Final    Special Requests BOTTLES DRAWN AEROBIC AND ANAEROBIC 10CC   Final    Culture  Setup Time 10/24/2012 18:55   Final    Culture     Final    Value:        BLOOD CULTURE RECEIVED NO GROWTH TO DATE CULTURE WILL BE HELD FOR 5 DAYS BEFORE ISSUING A FINAL NEGATIVE REPORT   Report Status PENDING   Incomplete   CULTURE, BLOOD (ROUTINE X 2)     Status: Normal (Preliminary result)   Collection Time   10/24/12 11:25 AM      Component Value Range Status Comment   Specimen Description BLOOD HAND RIGHT   Final    Special Requests BOTTLES  DRAWN AEROBIC AND ANAEROBIC 10CC   Final    Culture  Setup Time 10/24/2012 18:55   Final    Culture     Final    Value:        BLOOD CULTURE RECEIVED NO GROWTH TO DATE CULTURE WILL BE HELD FOR 5  DAYS BEFORE ISSUING A FINAL NEGATIVE REPORT   Report Status PENDING   Incomplete   MRSA PCR SCREENING     Status: Normal   Collection Time   10/24/12 11:52 AM      Component Value Range Status Comment   MRSA by PCR NEGATIVE  NEGATIVE Final   CULTURE, RESPIRATORY     Status: Normal   Collection Time   10/25/12 10:04 AM      Component Value Range Status Comment   Specimen Description TRACHEAL ASPIRATE   Final    Special Requests NONE   Final    Gram Stain     Final    Value: ABUNDANT WBC PRESENT, PREDOMINANTLY PMN     NO SQUAMOUS EPITHELIAL CELLS SEEN     RARE GRAM POSITIVE COCCI IN PAIRS   Culture Non-Pathogenic Oropharyngeal-type Flora Isolated.   Final    Report Status 10/27/2012 FINAL   Final      Studies: Dg Chest Portable 1 View  10/24/2012  *RADIOLOGY REPORT*  Clinical Data: ETT placement  PORTABLE CHEST - 1 VIEW  Comparison: 10/24/2012 at 0757 hours  Findings: Endotracheal tube terminates 5 cm above the carina.  Left IJ venous catheter terminates at/just above the cavoatrial junction.  Lungs are essentially clear.  No pleural effusion or pneumothorax.  The heart is normal in size.  IMPRESSION: Endotracheal tube terminates 5 cm above the carina.  Left IJ venous catheter terminates at/just above the cavoatrial junction.  No pneumothorax.   Original Report Authenticated By: Charline Bills, M.D.    Dg Chest Port 1 View  10/24/2012  *RADIOLOGY REPORT*  Clinical Data: Shortness of breath.  PORTABLE CHEST - 1 VIEW  Comparison: 06/05/2012  Findings: The heart size and pulmonary vascularity are normal and the lungs are clear.  No osseous abnormality.  IMPRESSION: Normal chest.   Original Report Authenticated By: Francene Boyers, M.D.     Scheduled Meds:   . antiseptic oral rinse  15 mL Mouth  Rinse QID  . chlorhexidine  15 mL Mouth/Throat BID  . enoxaparin  40 mg Subcutaneous Q24H  . fentaNYL  200 mcg Intravenous Once  . insulin aspart  0-20 Units Subcutaneous TID WC  . insulin aspart protamine-insulin aspart  25 Units Subcutaneous BID WC  . labetalol  100 mg Oral BID  . lisinopril  10 mg Oral Daily  . pantoprazole  40 mg Oral Daily  . piperacillin-tazobactam (ZOSYN)  IV  3.375 g Intravenous Q8H  . [EXPIRED] potassium chloride      . potassium chloride  40 mEq Oral BID  . sodium bicarbonate  650 mg Oral BID  . vancomycin  1,250 mg Intravenous Q12H  . [DISCONTINUED] insulin aspart protamine-insulin aspart  15 Units Subcutaneous BID WC   Continuous Infusions:   . 0.9 % NaCl with KCl 40 mEq / L 75 mL/hr at 10/29/12 0401  . insulin (NOVOLIN-R) infusion 10.8 Units/hr (10/27/12 1942)    Principal Problem:  *DKA (diabetic ketoacidoses) Active Problems:  Leukocytosis  HTN (hypertension)  Diabetes mellitus  Acute respiratory failure    Time spent: 40 minutes   [facility]  [facility] Pager [phone]. If 8PM-8AM, please contact night-coverage at [email or web address], password [facility] 10/29/2012, 11:53 AM  LOS: 5 days

## 2012-10-30 DIAGNOSIS — L03119 Cellulitis of unspecified part of limb: Secondary | ICD-10-CM

## 2012-10-30 DIAGNOSIS — L02419 Cutaneous abscess of limb, unspecified: Secondary | ICD-10-CM

## 2012-10-30 LAB — BASIC METABOLIC PANEL
BUN: 10 mg/dL (ref 6–23)
CO2: 14 mEq/L — ABNORMAL LOW (ref 19–32)
Calcium: 8.3 mg/dL — ABNORMAL LOW (ref 8.4–10.5)
GFR calc non Af Amer: 75 mL/min — ABNORMAL LOW (ref >90)
Glucose, Bld: 280 mg/dL — ABNORMAL HIGH (ref 70–99)

## 2012-10-30 LAB — CULTURE, BLOOD (ROUTINE X 2)
Culture: NO GROWTH
Culture: NO GROWTH

## 2012-10-30 LAB — CBC
HCT: 28.6 % — ABNORMAL LOW (ref 36.0–46.0)
Hemoglobin: 9.4 g/dL — ABNORMAL LOW (ref 12.0–15.0)
MCH: 28.7 pg (ref 26.0–34.0)
MCHC: 32.9 g/dL (ref 30.0–36.0)
MCV: 87.2 fL (ref 78.0–100.0)
RBC: 3.28 MIL/uL — ABNORMAL LOW (ref 3.87–5.11)

## 2012-10-30 LAB — GLUCOSE, CAPILLARY
Glucose-Capillary: 288 mg/dL — ABNORMAL HIGH (ref 70–99)
Glucose-Capillary: 213 mg/dL — ABNORMAL HIGH (ref 70–99)
Glucose-Capillary: 189 mg/dL — ABNORMAL HIGH (ref 70–99)

## 2012-10-30 MED ORDER — SODIUM CHLORIDE 0.9 % IJ SOLN
10.0000 mL | INTRAMUSCULAR | Status: DC | PRN
  Administered 2012-11-04 – 2012-11-05 (×2): 10 mL

## 2012-10-30 MED ORDER — INSULIN ASPART PROT & ASPART (70-30 MIX) 100 UNIT/ML ~~LOC~~ SUSP
35.0000 [IU] | Freq: Two times a day (BID) | SUBCUTANEOUS | Status: DC
  Administered 2012-10-30 (×2): 35 [IU] via SUBCUTANEOUS

## 2012-10-30 NOTE — Progress Notes (Signed)
Physical Therapy Treatment Patient Details Name: Rebecca Rhodes MRN: [id number] DOB: 04-14-1981 Today's Date: 10/30/2012 Time: 7829-5621 PT Time Calculation (min): 27 min  PT Assessment / Plan / Recommendation Comments on Treatment Session  Pt. presents to be moving well in regards to getting OOB and with ambulation. Pt. reports of pain from boils inbetween her legs making it difficult to ambulate. Introduced RW for ambulation (to allow use of UE's to (A) with decreasing pressure on LE's) and pt. able to increase her speed and distance with ambulation. RN notified of pt. report of feeling a "weird, bubble" sensation in her chest that began with ambulation and passed with rest break at which point pt. wished to ambulate back to her room via ambulation.     Follow Up Recommendations  Home health PT;Other (comment)     Does the patient have the potential to tolerate intense rehabilitation     Barriers to Discharge        Equipment Recommendations  None recommended by PT    Recommendations for Other Services    Frequency Min 3X/week   Plan      Precautions / Restrictions Precautions Precautions: Fall Restrictions Weight Bearing Restrictions: No   Pertinent Vitals/Pain Patient reports pain 4/10 of boils on her inner thighs. With ambulation pt. Experienced a "weird, bubble" in her chest, with some pain, that passed with rest break and pt. Wished to ambulate back to room stating that she has experienced this sensation prior to being admitted to the hospital. RN notified.    Mobility  Bed Mobility Bed Mobility: Supine to Sit;Sitting - Scoot to Edge of Bed Supine to Sit: 4: Min guard Sitting - Scoot to [place] of Bed: 5: Supervision Details for Bed Mobility Assistance: No physical (A) getting to EOB, pt. ableto get to EOB with increased time d/t pain from what she is calling boils on her inner thighs. Transfers Transfers: Sit to Stand;Stand to Sit Sit to Stand: 4: Min guard;With upper  extremity assist;From chair/3-in-1;From bed;With armrests Stand to Sit: 4: Min guard;With upper extremity assist;With armrests;To chair/3-in-1 Details for Transfer Assistance: Min guard for sit<>stand for safety and steadiness. Pt. with increased time d/t boils she is describing between her legs causing pain.  Pt. given VC for proper hand placement when RW was introduced during session. Ambulation/Gait Ambulation/Gait Assistance: 4: Min guard Ambulation Distance (Feet): 200 Feet Assistive device: 1 person hand held assist;Rolling walker Ambulation/Gait Assistance Details: Pt initially ambulating with only HHA but with very slow speed & appeared to be very uncomfortable, therefore Introduced RW to allow UE support to help off-load weight from LE's & provide comfort.  Pt reported increased comfort with RW as well as able to increase gait speed & increased smoothness of pattern.  Pt. stated, during ambulation, that she felt a weird sensation in her chest that was not pain, but felt like a bubble in her chest. After rest break she stated there was no longer the feeling and wished to ambulate back to her room. RN notified. Pt. stated that she has experienced this before being admitted to the hospital Gait Pattern: Step-through pattern;Decreased stride length;Antalgic;Narrow base of support Gait velocity: slow Stairs: No Wheelchair Mobility Wheelchair Mobility: No    Exercises General Exercises - Lower Extremity Ankle Circles/Pumps: AROM;Both;10 reps;Seated Long Arc Quad: AROM;Both;5 reps;Seated (Pt. encouraged to do a few and take break) Hip ABduction/ADduction: AROM;Both;5 reps;Standing Hip Flexion/Marching: AROM;Both;10 reps;Standing     PT Goals Acute Rehab PT Goals PT Goal Formulation: With patient  Time For Goal Achievement: 11/02/12 Potential to Achieve Goals: Good Pt will go Supine/Side to Sit: Independently;with HOB 0 degrees PT Goal: Supine/Side to Sit - Progress: Progressing toward  goal Pt will go Sit to Stand: Independently PT Goal: Sit to Stand - Progress: Progressing toward goal Pt will Transfer Bed to Chair/Chair to Bed: Independently Pt will Ambulate: >150 feet;Independently PT Goal: Ambulate - Progress: Progressing toward goal Pt will Go Up / Down Stairs: 3-5 stairs;with modified independence;with least restrictive assistive device  Visit Information  Last PT Received On: 10/30/12 Assistance Needed: +1    Subjective Data  Subjective: "I'm hurting between my legs, I have boils" Patient Stated Goal: Home   Cognition  Overall Cognitive Status: Appears within functional limits for tasks assessed/performed Arousal/Alertness: Awake/alert Orientation Level: Appears intact for tasks assessed Behavior During Session: [facility] for tasks performed    Balance     End of Session PT - End of Session Equipment Utilized During Treatment: Gait belt Activity Tolerance: Patient limited by pain Patient left: in chair;with call bell/phone within reach Nurse Communication: Mobility status;Patient requests pain meds;Other (comment) (Sensation of "bubble" in pt's chest)    Mertie Clause, SPTA 10/30/2012, 2:43 PM   Verdell Face, PTA [phone] 11/01/2012

## 2012-10-30 NOTE — Consult Note (Addendum)
Reason for Consult:Possible left posterior thigh abscess. Referring Physician: Velora Horstman is an 31 y.o. female.  HPI: Patient is a 31 year old black female who presented to the emergency room of [facility] complaints of shortness of breath. On admission she was found to have a profound metabolic acidosis with anion gap of 20.1. She required intubation at that time. She reports a boil on her left leg started 2-3 days before she got sick. She also had one at the same position on the right which drained before she came in to the emergency room. She reports the right side  drainage was a red colored drainage, which resolved before admission. Since her admission, her diabetes has been controlled. She was placed on Zosyn and vancomycin on admission. She denies any drainage since admission. Sites on both the right and the left just below the are still tender. There are some skin maceration on the left over an area that is about 7 cm in diameter, it is tender and somewhat fluctuant. She has a smaller area on the right she is still slightly tender but much improved from the time that drained. A CT with contrast was obtained after admission of the left femur. This shows cellulitis proximal medial aspect of both thighs, more prominent on the left than the right. There is a 8.4 x 7.2 x 3.2 cm poorly defined fluid collection on the left. There is adjacent subcutaneous edema consistent with cellulitis. There is a smaller area the anterior subcutaneous edema on the right also. Labs revealed irregular but consistent glucose elevations. Her creatinine has gone from 1.49 down to 0.99. Her WBC was 15.9 on admission, currently it is 14.3. She has been on vancomycin 2500 mg IV daily and Zosyn 3.75 mg IV every 6 hours since admission 1113/13. She has had episodes of hydradenitis in the past neck, chest, and leg.  Urine, and blood cultures are negative, no drainage cultures have been obtained.  We're asked to see  the patient in consultation.   Past Medical History  Diagnosis Date  . Diabetes mellitus/Poor compliance, no insurance with employer for 1 year.   . Hypertension   . HTN (hypertension) 06/04/2012  . Hyperlipidemia 06/04/2012  . Diabetes mellitus 06/04/2012    Past surgical history: None  Social History:  reports that she has never smoked. She does not have any smokeless tobacco history on file. She reports that she does not drink alcohol or use illicit drugs. .  Works as a Conservation officer, nature at [employer].  Allergies: No Known Allergies  Medications:  Prior to Admission:  Prescriptions prior to admission  Medication Sig Dispense Refill  . insulin aspart protamine-insulin aspart (NOVOLOG 70/30) (70-30) 100 UNIT/ML injection Inject 20 Units into the skin daily with supper.      . labetalol (NORMODYNE) 200 MG tablet Take 200 mg by mouth 2 (two) times daily.      . insulin regular (NOVOLIN R,HUMULIN R) 100 units/mL injection Inject 10-25 Units into the skin 3 (three) times daily before meals. Per sliding scale.      Marland Kitchen levofloxacin (LEVAQUIN) 750 MG tablet Take 750 mg by mouth daily.      Marland Kitchen lisinopril (PRINIVIL,ZESTRIL) 10 MG tablet Take 10 mg by mouth daily.       Scheduled:   . antiseptic oral rinse  15 mL Mouth Rinse QID  . chlorhexidine  15 mL Mouth/Throat BID  . enoxaparin  40 mg Subcutaneous Q24H  . fentaNYL  200 mcg Intravenous Once  .  insulin aspart  0-20 Units Subcutaneous TID WC  . insulin aspart protamine-insulin aspart  35 Units Subcutaneous BID WC  . labetalol  100 mg Oral BID  . lisinopril  10 mg Oral Daily  . pantoprazole  40 mg Oral Daily  . piperacillin-tazobactam (ZOSYN)  IV  3.375 g Intravenous Q8H  . potassium chloride  40 mEq Oral BID  . sodium bicarbonate  650 mg Oral BID  . vancomycin  1,250 mg Intravenous Q12H  . [DISCONTINUED] insulin aspart protamine-insulin aspart  15 Units Subcutaneous BID WC  . [DISCONTINUED] insulin aspart protamine-insulin aspart  25 Units  Subcutaneous BID WC   Continuous:   . 0.9 % NaCl with KCl 40 mEq / L 75 mL/hr at 10/29/12 0401  . [DISCONTINUED] insulin (NOVOLIN-R) infusion 10.8 Units/hr (10/27/12 1942)   NFA:[id number], fentaNYL, [COMPLETED] iohexol, labetalol Anti-infectives     Start     Dose/Rate Route Frequency Ordered Stop   10/28/12 1400   vancomycin (VANCOCIN) 1,250 mg in sodium chloride 0.9 % 250 mL IVPB        1,250 mg 166.7 mL/hr over 90 Minutes Intravenous Every 12 hours 10/28/12 1137     10/28/12 1400  piperacillin-tazobactam (ZOSYN) IVPB 3.375 g       3.375 g 12.5 mL/hr over 240 Minutes Intravenous 3 times per day 10/28/12 1137     10/26/12 1400   fluconazole (DIFLUCAN) IVPB 200 mg        200 mg 100 mL/hr over 60 Minutes Intravenous Every 24 hours 10/25/12 1555 10/27/12 1345   10/25/12 1300   fluconazole (DIFLUCAN) IVPB 150 mg  Status:  Discontinued        150 mg 75 mL/hr over 60 Minutes Intravenous Every 24 hours 10/25/12 1146 10/25/12 1243   10/25/12 1300   sodium chloride 0.9 % 0.01 mL with fluconazole (DIFLUCAN) 150 mg infusion  Status:  Discontinued        75 mL/hr  Intravenous Every 24 hours 10/25/12 1240 10/25/12 1554   10/24/12 1230   piperacillin-tazobactam (ZOSYN) IVPB 3.375 g  Status:  Discontinued        3.375 g 12.5 mL/hr over 240 Minutes Intravenous 3 times per day 10/24/12 1157 10/26/12 1112   10/24/12 1200   vancomycin (VANCOCIN) 1,250 mg in sodium chloride 0.9 % 250 mL IVPB  Status:  Discontinued        1,250 mg 166.7 mL/hr over 90 Minutes Intravenous Every 12 hours 10/24/12 1157 10/26/12 1112          Results for orders placed during the hospital encounter of 10/24/12 (from the past 48 hour(s))  GLUCOSE, CAPILLARY     Status: Abnormal   Collection Time   10/28/12 12:17 PM      Component Value Range Comment   Glucose-Capillary 152 (*) 70 - 99 mg/dL    Comment 1 Notify RN     GLUCOSE, CAPILLARY     Status: Abnormal   Collection Time   10/28/12  1:26 PM      Component  Value Range Comment   Glucose-Capillary 165 (*) 70 - 99 mg/dL    Comment 1 Notify RN     GLUCOSE, CAPILLARY     Status: Abnormal   Collection Time   10/28/12  5:15 PM      Component Value Range Comment   Glucose-Capillary 193 (*) 70 - 99 mg/dL    Comment 1 Notify RN     BASIC METABOLIC PANEL     Status: Abnormal  Collection Time   10/28/12  8:30 PM      Component Value Range Comment   Sodium 134 (*) 135 - 145 mEq/L    Potassium 3.7  3.5 - 5.1 mEq/L    Chloride 107  96 - 112 mEq/L    CO2 16 (*) 19 - 32 mEq/L    Glucose, Bld 301 (*) 70 - 99 mg/dL    BUN 11  6 - 23 mg/dL    Creatinine, Ser 2.13  0.50 - 1.10 mg/dL    Calcium 8.1 (*) 8.4 - 10.5 mg/dL    GFR calc non Af Amer 73 (*) >90 mL/min    GFR calc Af Amer 84 (*) >90 mL/min   GLUCOSE, CAPILLARY     Status: Abnormal   Collection Time   10/28/12 10:15 PM      Component Value Range Comment   Glucose-Capillary 303 (*) 70 - 99 mg/dL   GLUCOSE, CAPILLARY     Status: Abnormal   Collection Time   10/29/12  7:55 AM      Component Value Range Comment   Glucose-Capillary 279 (*) 70 - 99 mg/dL    Comment 1 Notify RN     GLUCOSE, CAPILLARY     Status: Abnormal   Collection Time   10/29/12 12:33 PM      Component Value Range Comment   Glucose-Capillary 347 (*) 70 - 99 mg/dL    Comment 1 Notify RN     HEMOGLOBIN A1C     Status: Abnormal   Collection Time   10/29/12  4:35 PM      Component Value Range Comment   Hemoglobin A1C >20.0 (*) <5.7 %    Mean Plasma Glucose NOT CALCULATED  <117 mg/dL   GLUCOSE, CAPILLARY     Status: Abnormal   Collection Time   10/29/12  5:17 PM      Component Value Range Comment   Glucose-Capillary 270 (*) 70 - 99 mg/dL    Comment 1 Notify RN     GLUCOSE, CAPILLARY     Status: Abnormal   Collection Time   10/29/12  9:53 PM      Component Value Range Comment   Glucose-Capillary 259 (*) 70 - 99 mg/dL   CBC     Status: Abnormal   Collection Time   10/30/12  6:37 AM      Component Value Range Comment    WBC 14.3 (*) 4.0 - 10.5 K/uL    RBC 3.28 (*) 3.87 - 5.11 MIL/uL    Hemoglobin 9.4 (*) 12.0 - 15.0 g/dL    HCT 08.6 (*) 57.8 - 46.0 %    MCV 87.2  78.0 - 100.0 fL    MCH 28.7  26.0 - 34.0 pg    MCHC 32.9  30.0 - 36.0 g/dL    RDW 46.9  62.9 - 52.8 %    Platelets 253  150 - 400 K/uL   BASIC METABOLIC PANEL     Status: Abnormal   Collection Time   10/30/12  6:37 AM      Component Value Range Comment   Sodium 134 (*) 135 - 145 mEq/L    Potassium 4.3  3.5 - 5.1 mEq/L    Chloride 107  96 - 112 mEq/L    CO2 14 (*) 19 - 32 mEq/L    Glucose, Bld 280 (*) 70 - 99 mg/dL    BUN 10  6 - 23 mg/dL    Creatinine, Ser 4.13  0.50 -  1.10 mg/dL    Calcium 8.3 (*) 8.4 - 10.5 mg/dL    GFR calc non Af Amer 75 (*) >90 mL/min    GFR calc Af Amer 86 (*) >90 mL/min   GLUCOSE, CAPILLARY     Status: Abnormal   Collection Time   10/30/12  7:56 AM      Component Value Range Comment   Glucose-Capillary 261 (*) 70 - 99 mg/dL     Ct Femur Left W Contrast  10/29/2012  *RADIOLOGY REPORT*  Clinical Data: Upper thigh pain.  Cellulitis.  Leukocytosis.  CT OF THE LEFT FEMUR WITH CONTRAST  Contrast: OMNIPAQUE IOHEXOL 300 MG/ML  SOLN  Comparison: None.  Findings: There are focal areas of cellulitis in the proximal medial aspects of both thighs, more prominent on the left than the right.  There is a poorly defined fluid collection in the soft tissues of the medial aspect of the left thigh measuring 8.4 x 4.2 x 3.2 cm.  This is not a well defined abscess.  There is prominent edema in the adjacent subcutaneous fat consistent with cellulitis.  There is a slightly more anterior area of subcutaneous edema, poorly defined in the proximal right thigh.  The adjacent muscle structures appear normal.  There is slight reactive adenopathy in the left inguinal region.  No acute osseous abnormality.  There is a small nonspecific left knee effusion.  IMPRESSION: Cellulitis in the proximal medial aspects of both thighs, more prominent  on the left than the right.  There are poorly defined fluid collections in the subcutaneous fat without a well marginated abscess. I suspect some pus could be obtained from the proximal left thigh but the fluid collection is not well defined.   Original Report Authenticated By: Francene Boyers, M.D.     Review of Systems  Constitutional: Positive for weight loss. Negative for fever, chills, malaise/fatigue and diaphoresis.  HENT: Negative.   Eyes: Positive for blurred vision. Negative for double vision, pain and discharge.       She's not had vision checked for some time.  Respiratory: Negative.   Cardiovascular: Positive for chest pain (during this admission), leg swelling and PND (she has CPAP machine at her bedside.). Negative for palpitations (60-70 pounds last year, she is not on a diet.), orthopnea and claudication.  Gastrointestinal: Positive for heartburn. Negative for nausea, vomiting, abdominal pain, diarrhea and constipation.  Genitourinary: Negative.   Musculoskeletal: Negative.   Skin:       She has had these before, She had one on the right that drained before admission.  The one currently on the left has been present for 9-10 days.  Neurological: Negative.  Negative for weakness.  Endo/Heme/Allergies: Negative.   Psychiatric/Behavioral: Negative.    Blood pressure 104/59, pulse 96, temperature 98.5 F (36.9 C), temperature source Oral, resp. rate 18, height 5' 4.17" (1.63 m), weight 201 lb 15.1 oz (91.6 kg), SpO2 99.00%. Physical Exam  Constitutional: She is oriented to person, place, and time. She appears well-developed and well-nourished. No distress.       BMI 34.5  HENT:  Head: Normocephalic and atraumatic.  Nose: Nose normal.  Eyes: Conjunctivae normal are normal. Pupils are equal, round, and reactive to light. Right eye exhibits no discharge. Left eye exhibits no discharge. No scleral icterus.  Neck: Normal range of motion. Neck supple. No JVD present. No tracheal  deviation present. No thyromegaly present.  Cardiovascular: Normal rate, regular rhythm, normal heart sounds and intact distal pulses.  Exam reveals  no gallop.   No murmur heard. Respiratory: Effort normal and breath sounds normal. No stridor. No respiratory distress. She has no wheezes. She has no rales. She exhibits no tenderness.  GI: Soft. Bowel sounds are normal. She exhibits no distension and no mass. There is no tenderness. There is no rebound and no guarding.  Genitourinary:       She has an area on the left posterior thigh just below the buttocks which measures approximately 7 cm in diameter which is raised slightly fluctuant and tender. She has an area on the right buttocks directly across from this which she says drain it is much smaller but there may be a smaller fluctuant area here too.  Musculoskeletal: Normal range of motion. She exhibits no edema.  Lymphadenopathy:    She has no cervical adenopathy.  Neurological: She is alert and oriented to person, place, and time. No cranial nerve deficit.  Skin: Skin is warm and dry. She is not diaphoretic.       See above exam  Psychiatric: She has a normal mood and affect. Her behavior is normal. Judgment and thought content normal.    Assessment/Plan: 1. Left posterior thigh cellulitis/probable abscess. 2. History of hydradenitis with similar process on the right that has drained. 3. AODM with poor control/DKA 4. Shortness of breath with respiratory failure secondary to DKA with altered mental status 5. Hypertension 6. Weight loss 60-70 pounds/BMI 34.5  Plan: I will get Dr.Wyatt To look at the area and keep her n.p.o. after midnight for possible exam under anesthesia and incision with drainage of possible abscess left posterior thigh.  Will [facility] physician assistant for Dr. Frederik Schmidt.        Xzavion Doswell 10/30/2012, 11:21 AM

## 2012-10-30 NOTE — Progress Notes (Signed)
Pt stated that she was having midsternum chest pain vital sign bp 104/59 hr 96. Pt has been in NSR o2 stats 99% Ra she was able to speak with sentences with no signs of shortness of breath. Will continue to monitor. Ilean Skill LPN

## 2012-10-30 NOTE — Progress Notes (Signed)
Peripherally Inserted Central Catheter/Midline Placement  The IV Nurse has discussed with the patient and/or persons authorized to consent for the patient, the purpose of this procedure and the potential benefits and risks involved with this procedure.  The benefits include less needle sticks, lab draws from the catheter and patient may be discharged home with the catheter.  Risks include, but not limited to, infection, bleeding, blood clot (thrombus formation), and puncture of an artery; nerve damage and irregular heat beat.  Alternatives to this procedure were also discussed.  PICC/Midline Placement Documentation        Rebecca Rhodes 10/30/2012, 12:23 PM

## 2012-10-30 NOTE — Consult Note (Signed)
Large abscess on the medial aspect of the left thigh.  Will drain in OR tomorrow.  Marta Lamas. Gae Bon, MD, FACS [phone] [facility]

## 2012-10-30 NOTE — Progress Notes (Signed)
[facility] PROGRESS NOTE  Rebecca Rhodes ZOX:[id number] DOB: 1981/07/28 DOA: 10/24/2012 PCP: Georganna Skeans, MD  Assessment/Plan: Principal Problem:  *DKA (diabetic ketoacidoses) Active Problems:  Leukocytosis  HTN (hypertension)  Diabetes mellitus  Acute respiratory failure    Acute respiratory failure status post extubation, respiratory status is stable   Hypertension continue ACE inhibitor, when necessary labetalol   Acute renal insufficiency creatinine went up to 1.49, now 0.99  with IV hydration   Diabetic ketoacidosis patient had a recurrence of her DKA with an anion gap and gone up to 22 do to presence of cellulitis, now improved. Anion gap significantly improve, 11 this morning. Was transitioned to NPH and sliding scale insulin , increase dose of NPH to 35 units twice a day . Uncontrolled CBGs due to infection  Anemia hemoglobin has come down from 16.0 9.4 monitor for bleeding    Leukocytosis initially thought to have a boil, was on vancomycin and Zosyn which have been resumed as cellulitis appears to be still present , CT scan shows cellulitis and proximal aspect of both thighs. Surgery consultation obtained  Metabolic acidosis, suspected RTA, will continue the patient on sodium bicarbonate tablets   Acute metabolic encephalopathy secondary to hypertensive encephalopathy now improved    Hypokalemia replete   Code Status: full  Family Communication: family updated about patient's clinical progress  Disposition Plan: PT assessment pending   Brief narrative:  Rebecca Rhodes is a 31 yo female who presented to [facility] via EMS on 11/13 with a chief complaint of shortness of breath. She was found to be in DKA with a AG of 20.1. The patient was placed on CPAP in the ER, given 2L of NS and an insulin drip was ordered. PCCM was called for admission and further management  Interim Events: Extubated, awake but drowsy, no complaints this AM  LINES / TUBES:  Left CVC IJ 11/13  >>>  Foley 11/13 >>>  oett 11/13>>> 11/15  CULTURES:  Blood x2 11/13 >>>  Urine 11/13 >>> neg  Sputum 11/13 >>>  ANTIBIOTICS:  Zosyn 11/13 >>> 11/15  Vanc 11/13 >>> 11/15  SIGNIFICANT EVENTS:  11/13: Intubated/1114- improved ph, neurostatus  11/4 Extubated, gap closed    HPI/Subjective:  Leg pain from her from her folliculitis  Objective: Filed Vitals:   10/29/12 1755 10/29/12 2148 10/30/12 0219 10/30/12 0544  BP: 124/65 110/65 115/72 104/59  Pulse: 107 102 110 96  Temp: 98.9 F (37.2 C) 99 F (37.2 C) 98.9 F (37.2 C) 98.5 F (36.9 C)  TempSrc: Oral Oral    Resp: 18 18 18 18   Height:      Weight:      SpO2: 100% 100% 100% 99%    Intake/Output Summary (Last 24 hours) at 10/30/12 1041 Last data filed at 10/30/12 0700  Gross per 24 hour  Intake   2020 ml  Output      0 ml  Net   2020 ml    Exam:  HENT:  Head: Atraumatic.  Nose: Nose normal.  Mouth/Throat: Oropharynx is clear and moist.  Eyes: Conjunctivae are normal. Pupils are equal, round, and reactive to light. No scleral icterus.  Neck: Neck supple. No tracheal deviation present.  Cardiovascular: Normal rate, regular rhythm, normal heart sounds and intact distal pulses.  Pulmonary/Chest: Effort normal and breath sounds normal. No respiratory distress.  Abdominal: Soft. Normal appearance and bowel sounds are normal. She exhibits no distension. There is no tenderness.  Musculoskeletal: She exhibits no edema and no tenderness.  Neurological:  She is alert. No cranial nerve deficit.    Data Reviewed: Basic Metabolic Panel:  Lab 10/30/12 1914 10/28/12 2030 10/28/12 0855 10/28/12 0242 10/27/12 2045  NA 134* 134* 139 136 137  K 4.3 3.7 3.3* 2.8* 3.0*  CL 107 107 110 108 108  CO2 14* 16* 19 17* 17*  GLUCOSE 280* 301* 158* 144* 179*  BUN 10 11 10 11 11   CREATININE 0.99 1.01 0.87 0.90 0.88  CALCIUM 8.3* 8.1* 8.3* 8.4 8.4  MG -- -- 1.8 -- --  PHOS -- -- -- -- --    Liver Function Tests:  Lab 10/24/12  1109  AST 11  ALT <5  ALKPHOS 113  BILITOT 0.2*  PROT 7.9  ALBUMIN 3.0*    Lab 10/24/12 1137  LIPASE 46  AMYLASE --   No results found for this basename: AMMONIA:5 in the last 168 hours  CBC:  Lab 10/30/12 0637 10/28/12 0242 10/27/12 0926 10/26/12 0554 10/25/12 0440 10/24/12 0802  WBC 14.3* 13.6* 13.2* 12.6* 15.9* --  NEUTROABS -- -- -- -- -- 21.3*  HGB 9.4* 9.7* 10.4* 10.3* 11.5* --  HCT 28.6* 28.8* 31.0* 31.2* 34.5* --  MCV 87.2 86.0 88.1 86.9 86.3 --  PLT 253 199 213 206 211 --    Cardiac Enzymes:  Lab 10/24/12 2000 10/24/12 1349 10/24/12 0806  CKTOTAL -- -- --  CKMB -- -- --  CKMBINDEX -- -- --  TROPONINI <0.30 <0.30 <0.30   BNP (last 3 results) No results found for this basename: PROBNP:3 in the last 8760 hours   CBG:  Lab 10/30/12 0756 10/29/12 2153 10/29/12 1717 10/29/12 1233 10/29/12 0755  GLUCAP 261* 259* 270* 347* 279*    Recent Results (from the past 240 hour(s))  URINE CULTURE     Status: Normal   Collection Time   10/24/12  8:47 AM      Component Value Range Status Comment   Specimen Description URINE, RANDOM   Final    Special Requests ADDED AT 0847 ON 0852   Final    Culture  Setup Time 10/24/2012 17:22   Final    Colony Count NO GROWTH   Final    Culture NO GROWTH   Final    Report Status 10/25/2012 FINAL   Final   CULTURE, BLOOD (ROUTINE X 2)     Status: Normal   Collection Time   10/24/12 11:18 AM      Component Value Range Status Comment   Specimen Description BLOOD ARM RIGHT   Final    Special Requests BOTTLES DRAWN AEROBIC AND ANAEROBIC 10CC   Final    Culture  Setup Time 10/24/2012 18:55   Final    Culture NO GROWTH 5 DAYS   Final    Report Status 10/30/2012 FINAL   Final   CULTURE, BLOOD (ROUTINE X 2)     Status: Normal   Collection Time   10/24/12 11:25 AM      Component Value Range Status Comment   Specimen Description BLOOD HAND RIGHT   Final    Special Requests BOTTLES DRAWN AEROBIC AND ANAEROBIC 10CC   Final    Culture   Setup Time 10/24/2012 18:55   Final    Culture NO GROWTH 5 DAYS   Final    Report Status 10/30/2012 FINAL   Final   MRSA PCR SCREENING     Status: Normal   Collection Time   10/24/12 11:52 AM      Component Value Range Status Comment  MRSA by PCR NEGATIVE  NEGATIVE Final   CULTURE, RESPIRATORY     Status: Normal   Collection Time   10/25/12 10:04 AM      Component Value Range Status Comment   Specimen Description TRACHEAL ASPIRATE   Final    Special Requests NONE   Final    Gram Stain     Final    Value: ABUNDANT WBC PRESENT, PREDOMINANTLY PMN     NO SQUAMOUS EPITHELIAL CELLS SEEN     RARE GRAM POSITIVE COCCI IN PAIRS   Culture Non-Pathogenic Oropharyngeal-type Flora Isolated.   Final    Report Status 10/27/2012 FINAL   Final      Studies: Ct Femur Left W Contrast  10/29/2012  *RADIOLOGY REPORT*  Clinical Data: Upper thigh pain.  Cellulitis.  Leukocytosis.  CT OF THE LEFT FEMUR WITH CONTRAST  Contrast: OMNIPAQUE IOHEXOL 300 MG/ML  SOLN  Comparison: None.  Findings: There are focal areas of cellulitis in the proximal medial aspects of both thighs, more prominent on the left than the right.  There is a poorly defined fluid collection in the soft tissues of the medial aspect of the left thigh measuring 8.4 x 4.2 x 3.2 cm.  This is not a well defined abscess.  There is prominent edema in the adjacent subcutaneous fat consistent with cellulitis.  There is a slightly more anterior area of subcutaneous edema, poorly defined in the proximal right thigh.  The adjacent muscle structures appear normal.  There is slight reactive adenopathy in the left inguinal region.  No acute osseous abnormality.  There is a small nonspecific left knee effusion.  IMPRESSION: Cellulitis in the proximal medial aspects of both thighs, more prominent on the left than the right.  There are poorly defined fluid collections in the subcutaneous fat without a well marginated abscess. I suspect some pus could be obtained  from the proximal left thigh but the fluid collection is not well defined.   Original Report Authenticated By: Francene Boyers, M.D.    Dg Chest Portable 1 View  10/24/2012  *RADIOLOGY REPORT*  Clinical Data: ETT placement  PORTABLE CHEST - 1 VIEW  Comparison: 10/24/2012 at 0757 hours  Findings: Endotracheal tube terminates 5 cm above the carina.  Left IJ venous catheter terminates at/just above the cavoatrial junction.  Lungs are essentially clear.  No pleural effusion or pneumothorax.  The heart is normal in size.  IMPRESSION: Endotracheal tube terminates 5 cm above the carina.  Left IJ venous catheter terminates at/just above the cavoatrial junction.  No pneumothorax.   Original Report Authenticated By: Charline Bills, M.D.    Dg Chest Port 1 View  10/24/2012  *RADIOLOGY REPORT*  Clinical Data: Shortness of breath.  PORTABLE CHEST - 1 VIEW  Comparison: 06/05/2012  Findings: The heart size and pulmonary vascularity are normal and the lungs are clear.  No osseous abnormality.  IMPRESSION: Normal chest.   Original Report Authenticated By: Francene Boyers, M.D.     Scheduled Meds:   . antiseptic oral rinse  15 mL Mouth Rinse QID  . chlorhexidine  15 mL Mouth/Throat BID  . enoxaparin  40 mg Subcutaneous Q24H  . fentaNYL  200 mcg Intravenous Once  . insulin aspart  0-20 Units Subcutaneous TID WC  . insulin aspart protamine-insulin aspart  25 Units Subcutaneous BID WC  . labetalol  100 mg Oral BID  . lisinopril  10 mg Oral Daily  . pantoprazole  40 mg Oral Daily  . piperacillin-tazobactam (ZOSYN)  IV  3.375 g Intravenous Q8H  . potassium chloride  40 mEq Oral BID  . sodium bicarbonate  650 mg Oral BID  . vancomycin  1,250 mg Intravenous Q12H  . [DISCONTINUED] insulin aspart protamine-insulin aspart  15 Units Subcutaneous BID WC   Continuous Infusions:   . 0.9 % NaCl with KCl 40 mEq / L 75 mL/hr at 10/29/12 0401  . insulin (NOVOLIN-R) infusion 10.8 Units/hr (10/27/12 1942)    Principal  Problem:  *DKA (diabetic ketoacidoses) Active Problems:  Leukocytosis  HTN (hypertension)  Diabetes mellitus  Acute respiratory failure    Time spent: 40 minutes   [facility]  [facility] Pager [phone]. If 8PM-8AM, please contact night-coverage at [email or web address], password [facility] 10/30/2012, 10:41 AM  LOS: 6 days

## 2012-10-30 NOTE — Progress Notes (Signed)
Note that 70/30 dose has been increased to 35 units twice daily.  Patient is NPO.  70/30 is usually considered an insulin to be used when the patient is eating.  Request that MD consider ordering NPH 24 units twice daily until patient is no longer NPO.  (70% of 35 = approximately 24 units)  Patient got an extra dose of 70/30 around noon, so would suggest that the NPH be given at HS tonight.  Tomorrow it could be scheduled twice daily around 8 am and 6 pm.  Once patient is eating again, 70/30 could be restarted on an 8 am and 6 pm schedule.  Thank you.  Ledger Heindl S. Elsie Lincoln, RN, CNS, CDE [facility]patient [facility], team pager [phone]

## 2012-10-30 NOTE — Progress Notes (Signed)
RT Note: Placed pt on Auto CPAP with a full face mask. Pt tolerating well. RT and RN will continue to monitor.

## 2012-10-31 ENCOUNTER — Inpatient Hospital Stay (HOSPITAL_COMMUNITY): Payer: MEDICAID | Admitting: Anesthesiology

## 2012-10-31 ENCOUNTER — Encounter (HOSPITAL_COMMUNITY): Payer: Self-pay | Admitting: Anesthesiology

## 2012-10-31 ENCOUNTER — Encounter (HOSPITAL_COMMUNITY)
Admission: EM | Disposition: A | Discharge status: Home Health | Payer: Self-pay | Source: Home / Self Care | Attending: Internal Medicine

## 2012-10-31 HISTORY — PX: IRRIGATION AND DEBRIDEMENT ABSCESS: SHX5252

## 2012-10-31 LAB — GLUCOSE, CAPILLARY
Glucose-Capillary: 180 mg/dL — ABNORMAL HIGH (ref 70–99)
Glucose-Capillary: 174 mg/dL — ABNORMAL HIGH (ref 70–99)
Glucose-Capillary: 169 mg/dL — ABNORMAL HIGH (ref 70–99)

## 2012-10-31 LAB — CBC
MCV: 87.5 fL (ref 78.0–100.0)
Platelets: 278 10*3/uL (ref 150–400)
RBC: 3.21 MIL/uL — ABNORMAL LOW (ref 3.87–5.11)
WBC: 13.8 10*3/uL — ABNORMAL HIGH (ref 4.0–10.5)

## 2012-10-31 SURGERY — IRRIGATION AND DEBRIDEMENT ABSCESS
Anesthesia: General | Site: Thigh | Laterality: Bilateral | Wound class: Dirty or Infected

## 2012-10-31 MED ORDER — LIDOCAINE HCL (CARDIAC) 20 MG/ML IV SOLN
INTRAVENOUS | Status: DC | PRN
  Administered 2012-10-31: 50 mg via INTRAVENOUS

## 2012-10-31 MED ORDER — HYDROMORPHONE HCL PF 1 MG/ML IJ SOLN
INTRAMUSCULAR | Status: AC
  Filled 2012-10-31: qty 1

## 2012-10-31 MED ORDER — ONDANSETRON HCL 4 MG/2ML IJ SOLN
INTRAMUSCULAR | Status: DC | PRN
  Administered 2012-10-31: 4 mg via INTRAVENOUS

## 2012-10-31 MED ORDER — HYDROMORPHONE HCL PF 1 MG/ML IJ SOLN
0.2500 mg | INTRAMUSCULAR | Status: DC | PRN
  Administered 2012-10-31 – 2012-11-03 (×3): 0.5 mg via INTRAVENOUS
  Filled 2012-10-31 (×2): qty 1

## 2012-10-31 MED ORDER — LACTATED RINGERS IV SOLN
INTRAVENOUS | Status: DC | PRN
  Administered 2012-10-31 (×2): via INTRAVENOUS

## 2012-10-31 MED ORDER — OXYCODONE-ACETAMINOPHEN 5-325 MG PO TABS
1.0000 | ORAL_TABLET | ORAL | Status: DC | PRN
  Administered 2012-11-01 – 2012-11-06 (×11): 2 via ORAL
  Filled 2012-10-31 (×11): qty 2

## 2012-10-31 MED ORDER — 0.9 % SODIUM CHLORIDE (POUR BTL) OPTIME
TOPICAL | Status: DC | PRN
  Administered 2012-10-31: 1000 mL

## 2012-10-31 MED ORDER — OXYCODONE HCL 5 MG/5ML PO SOLN: 5.0000 mg | Freq: Once | ORAL | Status: AC | PRN

## 2012-10-31 MED ORDER — FENTANYL CITRATE 0.05 MG/ML IJ SOLN
INTRAMUSCULAR | Status: DC | PRN
  Administered 2012-10-31 (×5): 50 ug via INTRAVENOUS

## 2012-10-31 MED ORDER — LACTATED RINGERS IV SOLN
INTRAVENOUS | Status: DC
  Administered 2012-10-31: 14:00:00 via INTRAVENOUS

## 2012-10-31 MED ORDER — ONDANSETRON HCL 4 MG/2ML IJ SOLN: 4.0000 mg | Freq: Four times a day (QID) | INTRAMUSCULAR | Status: DC | PRN

## 2012-10-31 MED ORDER — OXYCODONE HCL 5 MG PO TABS: 5.0000 mg | ORAL_TABLET | Freq: Once | ORAL | Status: AC | PRN

## 2012-10-31 MED ORDER — PROPOFOL 10 MG/ML IV BOLUS
INTRAVENOUS | Status: DC | PRN
  Administered 2012-10-31: 150 mg via INTRAVENOUS
  Administered 2012-10-31: 50 mg via INTRAVENOUS

## 2012-10-31 MED ORDER — VANCOMYCIN HCL 1000 MG IV SOLR
1250.0000 mg | INTRAVENOUS | Status: DC | PRN
  Administered 2012-10-31: 1200 mg via INTRAVENOUS

## 2012-10-31 MED ORDER — MIDAZOLAM HCL 5 MG/5ML IJ SOLN
INTRAMUSCULAR | Status: DC | PRN
  Administered 2012-10-31: 1 mg via INTRAVENOUS

## 2012-10-31 MED ORDER — PIPERACILLIN-TAZOBACTAM 3.375 G IVPB 30 MIN
INTRAVENOUS | Status: DC | PRN
  Administered 2012-10-31: 3.375 g via INTRAVENOUS

## 2012-10-31 MED ORDER — INSULIN ASPART PROT & ASPART (70-30 MIX) 100 UNIT/ML ~~LOC~~ SUSP
24.0000 [IU] | Freq: Two times a day (BID) | SUBCUTANEOUS | Status: DC
  Administered 2012-11-01 (×2): 24 [IU] via SUBCUTANEOUS
  Filled 2012-10-31: qty 3

## 2012-10-31 SURGICAL SUPPLY — 30 items
BANDAGE GAUZE ELAST BULKY 4 IN (GAUZE/BANDAGES/DRESSINGS) IMPLANT
BLADE SURG 10 STRL SS (BLADE) ×1 IMPLANT
BLADE SURG 15 STRL LF DISP TIS (BLADE) IMPLANT
BLADE SURG 15 STRL SS (BLADE) ×2
CANISTER SUCTION 2500CC (MISCELLANEOUS) ×2 IMPLANT
CLOTH BEACON ORANGE TIMEOUT ST (SAFETY) ×2 IMPLANT
COVER SURGICAL LIGHT HANDLE (MISCELLANEOUS) ×2 IMPLANT
DRAPE PED LAPAROTOMY (DRAPES) IMPLANT
DRAPE UTILITY 15X26 W/TAPE STR (DRAPE) ×4 IMPLANT
DRSG PAD ABDOMINAL 8X10 ST (GAUZE/BANDAGES/DRESSINGS) ×2 IMPLANT
ELECT CAUTERY BLADE 6.4 (BLADE) ×2 IMPLANT
ELECT REM PT RETURN 9FT ADLT (ELECTROSURGICAL) ×2
ELECTRODE REM PT RTRN 9FT ADLT (ELECTROSURGICAL) ×1 IMPLANT
GLOVE BIOGEL PI IND STRL 8 (GLOVE) ×1 IMPLANT
GLOVE BIOGEL PI INDICATOR 8 (GLOVE) ×1
GLOVE ECLIPSE 8.0 STRL XLNG CF (GLOVE) ×2 IMPLANT
GOWN STRL NON-REIN LRG LVL3 (GOWN DISPOSABLE) ×4 IMPLANT
KIT BASIN OR (CUSTOM PROCEDURE TRAY) ×2 IMPLANT
KIT ROOM TURNOVER OR (KITS) ×2 IMPLANT
NS IRRIG 1000ML POUR BTL (IV SOLUTION) ×2 IMPLANT
PACK GENERAL/GYN (CUSTOM PROCEDURE TRAY) ×2 IMPLANT
PACK LITHOTOMY IV (CUSTOM PROCEDURE TRAY) IMPLANT
PAD ARMBOARD 7.5X6 YLW CONV (MISCELLANEOUS) ×2 IMPLANT
SPONGE GAUZE 4X4 12PLY (GAUZE/BANDAGES/DRESSINGS) ×2 IMPLANT
SWAB COLLECTION DEVICE MRSA (MISCELLANEOUS) IMPLANT
TOWEL OR 17X24 6PK STRL BLUE (TOWEL DISPOSABLE) ×2 IMPLANT
TOWEL OR 17X26 10 PK STRL BLUE (TOWEL DISPOSABLE) ×2 IMPLANT
TUBE ANAEROBIC SPECIMEN COL (MISCELLANEOUS) IMPLANT
TUBE CONNECTING 12X1/4 (SUCTIONS) ×1 IMPLANT
YANKAUER SUCT BULB TIP NO VENT (SUCTIONS) ×1 IMPLANT

## 2012-10-31 NOTE — Op Note (Signed)
OPERATIVE REPORT  DATE OF OPERATION: 10/24/2012 - 10/31/2012  PATIENT:  Rebecca Rhodes  31 y.Rhodes. female  PRE-OPERATIVE DIAGNOSIS:  posterior/medial thigh abscess  POST-OPERATIVE DIAGNOSIS:  posterior/medial thigh abscess  PROCEDURE:  Procedure(s): IRRIGATION AND DEBRIDEMENT ABSCESS  SURGEON:  Surgeon(s): Cherylynn Ridges, MD  ASSISTANT: None  ANESTHESIA:   general  EBL: <50 ml  BLOOD ADMINISTERED: none  DRAINS: Packed wounds in both thighs with iodoform NuGauze   SPECIMEN:  Source of Specimen:  Bilateral thigh microbiology separately  COUNTS CORRECT:  YES  PROCEDURE DETAILS: The patient was taken to the operating room and placed on the table in the supine position. After an adequate endotracheal anesthetic was administered she was placed in lithotomy position and prepped and draped in usual sterile manner.  After proper time out was performed identifying the patient and procedure be performed we incised the right groin and medial thigh abscess using a #10 blade. Then went into a cavity that was surprisingly larger than suspected on the superficial appearance. Superficially measured approximately 8 x 5 cm in size those close to twice that size underneath the skin. We drained and cultured the drainage which is mucopurulent creamy fluid. We irrigated with saline. We subsequently packed with 2 entire bottles of half a inch iodoform Nu Gauze.  The left medial thigh abscess measured approximately 15 x 12 cm in size. Underneath the skin it was approximately the same size. We incised and drained and also culture this separately with aerobic and anaerobic culture media.  We irrigated with approximately half a liter of saline then subsequently packed it with an entire bottle of 1 inch iodoform Nu Gauze. Sterile dressings were applied. This included 4 x 4 gauze and ABDs pads.  All needle counts, sponge counts, and instrument counts were correct.    PATIENT DISPOSITION:  PACU -  hemodynamically stable.   Rebecca Rhodes 11/20/20135:17 PM

## 2012-10-31 NOTE — Anesthesia Preprocedure Evaluation (Signed)
Anesthesia Evaluation  Patient identified by MRN, date of birth, ID band Patient awake    Reviewed: Allergy & Precautions, H&P , NPO status , Patient's Chart, lab work & pertinent test results  Airway Mallampati: II  Neck ROM: full    Dental   Pulmonary          Cardiovascular hypertension,     Neuro/Psych    GI/Hepatic   Endo/Other  diabetes, Type 2obese  Renal/GU      Musculoskeletal   Abdominal   Peds  Hematology   Anesthesia Other Findings   Reproductive/Obstetrics                           Anesthesia Physical Anesthesia Plan  ASA: II  Anesthesia Plan: General   Post-op Pain Management:    Induction: Intravenous  Airway Management Planned: Oral ETT  Additional Equipment:   Intra-op Plan:   Post-operative Plan: Extubation in OR  Informed Consent: I have reviewed the patients History and Physical, chart, labs and discussed the procedure including the risks, benefits and alternatives for the proposed anesthesia with the patient or authorized representative who has indicated his/her understanding and acceptance.     Plan Discussed with: CRNA and Surgeon  Anesthesia Plan Comments:         Anesthesia Quick Evaluation  

## 2012-10-31 NOTE — Progress Notes (Signed)
ANTIBIOTIC CONSULT NOTE - Follow-up  Pharmacy Consult for Vancomycin + Zosyn Indication: Cellulitis  No Known Allergies  Patient Measurements: Height: 5' 4.17" (163 cm) (transcribed from 6/13) Weight: 201 lb 15.1 oz (91.6 kg) IBW/kg (Calculated) : 55.1   Vital Signs: Temp: 98.5 F (36.9 C) (11/20 0538) BP: 120/73 mmHg (11/20 0538) Pulse Rate: 96  (11/20 0538) Intake/Output from previous day: 11/19 0701 - 11/20 0700 In: 2000 [P.O.:600; I.V.:750; IV Piggyback:650] Out: -  Intake/Output from this shift:    Labs:  Basename 10/31/12 0615 10/30/12 0637 10/28/12 2030  WBC 13.8* 14.3* --  HGB 9.4* 9.4* --  PLT 278 253 --  LABCREA -- -- --  CREATININE -- 0.99 1.01   Estimated Creatinine Clearance: 89.8 ml/min (by C-G formula based on Cr of 0.99). No results found for this basename: VANCOTROUGH:2,VANCOPEAK:2,VANCORANDOM:2,GENTTROUGH:2,GENTPEAK:2,GENTRANDOM:2,TOBRATROUGH:2,TOBRAPEAK:2,TOBRARND:2,AMIKACINPEAK:2,AMIKACINTROU:2,AMIKACIN:2, in the last 72 hours   Microbiology: Recent Results (from the past 720 hour(s))  URINE CULTURE     Status: Normal   Collection Time   10/24/12  8:47 AM      Component Value Range Status Comment   Specimen Description URINE, RANDOM   Final    Special Requests ADDED AT 0847 ON 0852   Final    Culture  Setup Time 10/24/2012 17:22   Final    Colony Count NO GROWTH   Final    Culture NO GROWTH   Final    Report Status 10/25/2012 FINAL   Final   CULTURE, BLOOD (ROUTINE X 2)     Status: Normal   Collection Time   10/24/12 11:18 AM      Component Value Range Status Comment   Specimen Description BLOOD ARM RIGHT   Final    Special Requests BOTTLES DRAWN AEROBIC AND ANAEROBIC 10CC   Final    Culture  Setup Time 10/24/2012 18:55   Final    Culture NO GROWTH 5 DAYS   Final    Report Status 10/30/2012 FINAL   Final   CULTURE, BLOOD (ROUTINE X 2)     Status: Normal   Collection Time   10/24/12 11:25 AM      Component Value Range Status Comment   Specimen Description BLOOD HAND RIGHT   Final    Special Requests BOTTLES DRAWN AEROBIC AND ANAEROBIC 10CC   Final    Culture  Setup Time 10/24/2012 18:55   Final    Culture NO GROWTH 5 DAYS   Final    Report Status 10/30/2012 FINAL   Final   MRSA PCR SCREENING     Status: Normal   Collection Time   10/24/12 11:52 AM      Component Value Range Status Comment   MRSA by PCR NEGATIVE  NEGATIVE Final   CULTURE, RESPIRATORY     Status: Normal   Collection Time   10/25/12 10:04 AM      Component Value Range Status Comment   Specimen Description TRACHEAL ASPIRATE   Final    Special Requests NONE   Final    Gram Stain     Final    Value: ABUNDANT WBC PRESENT, PREDOMINANTLY PMN     NO SQUAMOUS EPITHELIAL CELLS SEEN     RARE GRAM POSITIVE COCCI IN PAIRS   Culture Non-Pathogenic Oropharyngeal-type Flora Isolated.   Final    Report Status 10/27/2012 FINAL   Final    Assessment: 31 y.o. F to resume Vancomycin + Zosyn (noted to be on earlier this admission 11/13 >> 11/15) for empiric cellulitis coverage. Pt is  afebrile and WBC is slightly elevated 13.8. Doses appear appropriate. Planning to head back to OR today.   Goal of Therapy:  Vancomycin trough level 10-15 mcg/ml  Plan:  1. Vancomycin 1250 mg IV every 12 hours - will order trough tomorrow if continued 2. Zosyn 3.375g IV every 8 hours 3. Will continue to follow renal function, culture results, LOT, and antibiotic de-escalation plans   Lysle Pearl, PharmD, BCPS Pager # [phone] 10/31/2012 12:06 PM

## 2012-10-31 NOTE — Progress Notes (Signed)
PT Cancellation Note  Patient Details Name: Rebecca Rhodes MRN: [id number] DOB: 05-03-1981   Cancelled Treatment:     Pt in OR for I & D.  Will f/u tomorrow.      Verdell Face, [place] [phone] 10/31/2012

## 2012-10-31 NOTE — Anesthesia Postprocedure Evaluation (Signed)
Anesthesia Post Note  Patient: Rebecca Rhodes  Procedure(s) Performed: Procedure(s) (LRB): IRRIGATION AND DEBRIDEMENT ABSCESS (Bilateral)  Anesthesia type: General  Patient location: PACU  Post pain: Pain level controlled and Adequate analgesia  Post assessment: Post-op Vital signs reviewed, Patient's Cardiovascular Status Stable, Respiratory Function Stable, Patent Airway and Pain level controlled  Last Vitals:  Filed Vitals:   10/31/12 1830  BP:   Pulse: 100  Temp: 37.2 C  Resp: 21    Post vital signs: Reviewed and stable  Level of consciousness: awake, alert  and oriented  Complications: No apparent anesthesia complications

## 2012-10-31 NOTE — Consult Note (Signed)
Will have abscess drainage on Wednesday.  Marta Lamas. Gae Bon, MD, FACS [phone] [facility]

## 2012-10-31 NOTE — Transfer of Care (Signed)
Immediate Anesthesia Transfer of Care Note  Patient: Rebecca Rhodes  Procedure(s) Performed: Procedure(s) (LRB) with comments: IRRIGATION AND DEBRIDEMENT ABSCESS (Bilateral)  Patient Location: PACU  Anesthesia Type:General  Level of Consciousness: awake, alert  and oriented  Airway & Oxygen Therapy: Patient Spontanous Breathing and Patient connected to nasal cannula oxygen  Post-op Assessment: Report given to PACU RN and Post -op Vital signs reviewed and stable  Post vital signs: Reviewed and stable  Complications: No apparent anesthesia complications

## 2012-10-31 NOTE — Progress Notes (Signed)
[facility] PROGRESS NOTE  [name] VQQ:[id number] DOB: 23-Apr-1981 DOA: 10/24/2012 PCP: Georganna Skeans, MD  Assessment/Plan: Principal Problem:  *DKA (diabetic ketoacidoses) Active Problems:  Leukocytosis  HTN (hypertension)  Diabetes mellitus  Abscess of buttock, left  Acute respiratory failure    Acute respiratory failure status post extubation, respiratory status is stable  Hypertension continue ACE inhibitor, when necessary labetalol  Acute renal insufficiency creatinine went up to 1.49, now 0.99 with IV hydration   Diabetic ketoacidosis patient had a recurrence of her DKA with an anion gap had gone up to 22 due  to presence of cellulitis, now improved. Anion gap significantly improve, 11 this morning. Was transitioned to NPH and sliding scale insulin , and change dose to 25 units twice a day and the patient is n.p.o. today . Uncontrolled CBGs due to infection  Hemoglobin A1c of greater than 20    Anemia hemoglobin has come down from 16.0 to 9.4 monitor for bleeding  Leukocytosis initially thought to have a boil, was on vancomycin and Zosyn which have been resumed as cellulitis appears to be still present , CT scan shows cellulitis and proximal aspect of both thighs. Surgery consultation obtained to the OR for incision and drainage today  Metabolic acidosis, suspected RTA, will continue the patient on sodium bicarbonate tablets  Acute metabolic encephalopathy secondary to hypertensive encephalopathy now improved  Hypokalemia replete    Code Status: full  Family Communication: family updated about patient's clinical progress  Disposition Plan abscess drainage today  Brief narrative:  Rebecca Rhodes is a 31 yo female who presented to [facility] via EMS on 11/13 with a chief complaint of shortness of breath. She was found to be in DKA with a AG of 20.1. The patient was placed on CPAP in the ER, given 2L of NS and an insulin drip was ordered. PCCM was called for admission and  further management  Interim Events: Extubated, awake but drowsy, no complaints this AM  LINES / TUBES:  Left CVC IJ 11/13 >>>  Foley 11/13 >>>  oett 11/13>>> 11/15  CULTURES:  Blood x2 11/13 >>>  Urine 11/13 >>> neg  Sputum 11/13 >>>  ANTIBIOTICS:  Zosyn 11/13 >>> 11/15  Vanc 11/13 >>> 11/15  SIGNIFICANT EVENTS:  11/13: Intubated/1114- improved ph, neurostatus  11/4 Extubated, gap closed    HPI/Subjective:  Leg pain from her from her folliculitis   Objective: Filed Vitals:   10/30/12 0544 10/30/12 1339 10/30/12 2047 10/31/12 0538  BP: 104/59 118/75 105/59 120/73  Pulse: 96 95 105 96  Temp: 98.5 F (36.9 C) 98.5 F (36.9 C) 98.7 F (37.1 C) 98.5 F (36.9 C)  TempSrc:  Oral    Resp: 18 20 20 18   Height:      Weight:      SpO2: 99% 100% 100% 100%    Intake/Output Summary (Last 24 hours) at 10/31/12 1146 Last data filed at 10/31/12 0616  Gross per 24 hour  Intake   1880 ml  Output      0 ml  Net   1880 ml    Exam:  HENT:  Head: Atraumatic.  Nose: Nose normal.  Mouth/Throat: Oropharynx is clear and moist.  Eyes: Conjunctivae are normal. Pupils are equal, round, and reactive to light. No scleral icterus.  Neck: Neck supple. No tracheal deviation present.  Cardiovascular: Normal rate, regular rhythm, normal heart sounds and intact distal pulses.  Pulmonary/Chest: Effort normal and breath sounds normal. No respiratory distress.  Abdominal: Soft. Normal appearance and bowel  sounds are normal. She exhibits no distension. There is no tenderness.  Musculoskeletal: She exhibits no edema and no tenderness.  Neurological: She is alert. No cranial nerve deficit.    Data Reviewed: Basic Metabolic Panel:  Lab 10/30/12 4540 10/28/12 2030 10/28/12 0855 10/28/12 0242 10/27/12 2045  NA 134* 134* 139 136 137  K 4.3 3.7 3.3* 2.8* 3.0*  CL 107 107 110 108 108  CO2 14* 16* 19 17* 17*  GLUCOSE 280* 301* 158* 144* 179*  BUN 10 11 10 11 11   CREATININE 0.99 1.01 0.87 0.90  0.88  CALCIUM 8.3* 8.1* 8.3* 8.4 8.4  MG -- -- 1.8 -- --  PHOS -- -- -- -- --    Liver Function Tests: No results found for this basename: AST:5,ALT:5,ALKPHOS:5,BILITOT:5,PROT:5,ALBUMIN:5 in the last 168 hours No results found for this basename: LIPASE:5,AMYLASE:5 in the last 168 hours No results found for this basename: AMMONIA:5 in the last 168 hours  CBC:  Lab 10/31/12 0615 10/30/12 0637 10/28/12 0242 10/27/12 0926 10/26/12 0554  WBC 13.8* 14.3* 13.6* 13.2* 12.6*  NEUTROABS -- -- -- -- --  HGB 9.4* 9.4* 9.7* 10.4* 10.3*  HCT 28.1* 28.6* 28.8* 31.0* 31.2*  MCV 87.5 87.2 86.0 88.1 86.9  PLT 278 253 199 213 206    Cardiac Enzymes:  Lab 10/24/12 2000 10/24/12 1349  CKTOTAL -- --  CKMB -- --  CKMBINDEX -- --  TROPONINI <0.30 <0.30   BNP (last 3 results) No results found for this basename: PROBNP:3 in the last 8760 hours   CBG:  Lab 10/30/12 2155 10/30/12 1557 10/30/12 1149 10/30/12 0756 10/29/12 2153  GLUCAP 189* 213* 288* 261* 259*    Recent Results (from the past 240 hour(s))  URINE CULTURE     Status: Normal   Collection Time   10/24/12  8:47 AM      Component Value Range Status Comment   Specimen Description URINE, RANDOM   Final    Special Requests ADDED AT 0847 ON 0852   Final    Culture  Setup Time 10/24/2012 17:22   Final    Colony Count NO GROWTH   Final    Culture NO GROWTH   Final    Report Status 10/25/2012 FINAL   Final   CULTURE, BLOOD (ROUTINE X 2)     Status: Normal   Collection Time   10/24/12 11:18 AM      Component Value Range Status Comment   Specimen Description BLOOD ARM RIGHT   Final    Special Requests BOTTLES DRAWN AEROBIC AND ANAEROBIC 10CC   Final    Culture  Setup Time 10/24/2012 18:55   Final    Culture NO GROWTH 5 DAYS   Final    Report Status 10/30/2012 FINAL   Final   CULTURE, BLOOD (ROUTINE X 2)     Status: Normal   Collection Time   10/24/12 11:25 AM      Component Value Range Status Comment   Specimen Description BLOOD  HAND RIGHT   Final    Special Requests BOTTLES DRAWN AEROBIC AND ANAEROBIC 10CC   Final    Culture  Setup Time 10/24/2012 18:55   Final    Culture NO GROWTH 5 DAYS   Final    Report Status 10/30/2012 FINAL   Final   MRSA PCR SCREENING     Status: Normal   Collection Time   10/24/12 11:52 AM      Component Value Range Status Comment   MRSA by  PCR NEGATIVE  NEGATIVE Final   CULTURE, RESPIRATORY     Status: Normal   Collection Time   10/25/12 10:04 AM      Component Value Range Status Comment   Specimen Description TRACHEAL ASPIRATE   Final    Special Requests NONE   Final    Gram Stain     Final    Value: ABUNDANT WBC PRESENT, PREDOMINANTLY PMN     NO SQUAMOUS EPITHELIAL CELLS SEEN     RARE GRAM POSITIVE COCCI IN PAIRS   Culture Non-Pathogenic Oropharyngeal-type Flora Isolated.   Final    Report Status 10/27/2012 FINAL   Final      Studies: Ct Femur Left W Contrast  10/29/2012  *RADIOLOGY REPORT*  Clinical Data: Upper thigh pain.  Cellulitis.  Leukocytosis.  CT OF THE LEFT FEMUR WITH CONTRAST  Contrast: OMNIPAQUE IOHEXOL 300 MG/ML  SOLN  Comparison: None.  Findings: There are focal areas of cellulitis in the proximal medial aspects of both thighs, more prominent on the left than the right.  There is a poorly defined fluid collection in the soft tissues of the medial aspect of the left thigh measuring 8.4 x 4.2 x 3.2 cm.  This is not a well defined abscess.  There is prominent edema in the adjacent subcutaneous fat consistent with cellulitis.  There is a slightly more anterior area of subcutaneous edema, poorly defined in the proximal right thigh.  The adjacent muscle structures appear normal.  There is slight reactive adenopathy in the left inguinal region.  No acute osseous abnormality.  There is a small nonspecific left knee effusion.  IMPRESSION: Cellulitis in the proximal medial aspects of both thighs, more prominent on the left than the right.  There are poorly defined fluid  collections in the subcutaneous fat without a well marginated abscess. I suspect some pus could be obtained from the proximal left thigh but the fluid collection is not well defined.   Original Report Authenticated By: Francene Boyers, M.D.    Dg Chest Portable 1 View  10/24/2012  *RADIOLOGY REPORT*  Clinical Data: ETT placement  PORTABLE CHEST - 1 VIEW  Comparison: 10/24/2012 at 0757 hours  Findings: Endotracheal tube terminates 5 cm above the carina.  Left IJ venous catheter terminates at/just above the cavoatrial junction.  Lungs are essentially clear.  No pleural effusion or pneumothorax.  The heart is normal in size.  IMPRESSION: Endotracheal tube terminates 5 cm above the carina.  Left IJ venous catheter terminates at/just above the cavoatrial junction.  No pneumothorax.   Original Report Authenticated By: Charline Bills, M.D.    Dg Chest Port 1 View  10/24/2012  *RADIOLOGY REPORT*  Clinical Data: Shortness of breath.  PORTABLE CHEST - 1 VIEW  Comparison: 06/05/2012  Findings: The heart size and pulmonary vascularity are normal and the lungs are clear.  No osseous abnormality.  IMPRESSION: Normal chest.   Original Report Authenticated By: Francene Boyers, M.D.     Scheduled Meds:   . antiseptic oral rinse  15 mL Mouth Rinse QID  . chlorhexidine  15 mL Mouth/Throat BID  . enoxaparin  40 mg Subcutaneous Q24H  . fentaNYL  200 mcg Intravenous Once  . insulin aspart  0-20 Units Subcutaneous TID WC  . insulin aspart protamine-insulin aspart  24 Units Subcutaneous BID WC  . labetalol  100 mg Oral BID  . lisinopril  10 mg Oral Daily  . pantoprazole  40 mg Oral Daily  . piperacillin-tazobactam (ZOSYN)  IV  3.375 g Intravenous Q8H  . potassium chloride  40 mEq Oral BID  . sodium bicarbonate  650 mg Oral BID  . vancomycin  1,250 mg Intravenous Q12H  . [DISCONTINUED] insulin aspart protamine-insulin aspart  35 Units Subcutaneous BID WC   Continuous Infusions:   . 0.9 % NaCl with KCl 40 mEq / L  75 mL/hr (10/30/12 2000)    Principal Problem:  *DKA (diabetic ketoacidoses) Active Problems:  Leukocytosis  HTN (hypertension)  Diabetes mellitus  Abscess of buttock, left  Acute respiratory failure    Time spent: 40 minutes   [facility]  [facility] Pager [phone]. If 8PM-8AM, please contact night-coverage at [email or web address], password [facility] 10/31/2012, 11:46 AM  LOS: 7 days

## 2012-10-31 NOTE — Progress Notes (Signed)
Subjective: Still sore.  Objective: Vital signs in last 24 hours: Temp:  [98.5 F (36.9 C)-98.7 F (37.1 C)] 98.5 F (36.9 C) (11/20 0538) Pulse Rate:  [95-105] 96  (11/20 0538) Resp:  [18-20] 18  (11/20 0538) BP: (105-120)/(59-75) 120/73 mmHg (11/20 0538) SpO2:  [100 %] 100 % (11/20 0538) Last BM Date: 10/30/12  Intake/Output from previous day: 11/19 0701 - 11/20 0700 In: 2000 [P.O.:600; I.V.:750; IV Piggyback:650] Out: -  Intake/Output this shift:    PE: General- In NAD   Lab Results:   Basename 10/31/12 0615 10/30/12 0637  WBC 13.8* 14.3*  HGB 9.4* 9.4*  HCT 28.1* 28.6*  PLT 278 253   BMET  Basename 10/30/12 0637 10/28/12 2030  NA 134* 134*  K 4.3 3.7  CL 107 107  CO2 14* 16*  GLUCOSE 280* 301*  BUN 10 11  CREATININE 0.99 1.01  CALCIUM 8.3* 8.1*   PT/INR No results found for this basename: LABPROT:2,INR:2 in the last 72 hours Comprehensive Metabolic Panel:    Component Value Date/Time   NA 134* 10/30/2012 0637   K 4.3 10/30/2012 0637   CL 107 10/30/2012 0637   CO2 14* 10/30/2012 0637   BUN 10 10/30/2012 0637   CREATININE 0.99 10/30/2012 0637   GLUCOSE 280* 10/30/2012 0637   CALCIUM 8.3* 10/30/2012 0637   AST 11 10/24/2012 1109   ALT <5 10/24/2012 1109   ALKPHOS 113 10/24/2012 1109   BILITOT 0.2* 10/24/2012 1109   PROT 7.9 10/24/2012 1109   ALBUMIN 3.0* 10/24/2012 1109     Studies/Results: Ct Femur Left W Contrast  10/29/2012  *RADIOLOGY REPORT*  Clinical Data: Upper thigh pain.  Cellulitis.  Leukocytosis.  CT OF THE LEFT FEMUR WITH CONTRAST  Contrast: OMNIPAQUE IOHEXOL 300 MG/ML  SOLN  Comparison: None.  Findings: There are focal areas of cellulitis in the proximal medial aspects of both thighs, more prominent on the left than the right.  There is a poorly defined fluid collection in the soft tissues of the medial aspect of the left thigh measuring 8.4 x 4.2 x 3.2 cm.  This is not a well defined abscess.  There is prominent edema in  the adjacent subcutaneous fat consistent with cellulitis.  There is a slightly more anterior area of subcutaneous edema, poorly defined in the proximal right thigh.  The adjacent muscle structures appear normal.  There is slight reactive adenopathy in the left inguinal region.  No acute osseous abnormality.  There is a small nonspecific left knee effusion.  IMPRESSION: Cellulitis in the proximal medial aspects of both thighs, more prominent on the left than the right.  There are poorly defined fluid collections in the subcutaneous fat without a well marginated abscess. I suspect some pus could be obtained from the proximal left thigh but the fluid collection is not well defined.   Original Report Authenticated By: Francene Boyers, M.D.     Anti-infectives: Anti-infectives     Start     Dose/Rate Route Frequency Ordered Stop   10/28/12 1400   vancomycin (VANCOCIN) 1,250 mg in sodium chloride 0.9 % 250 mL IVPB        1,250 mg 166.7 mL/hr over 90 Minutes Intravenous Every 12 hours 10/28/12 1137     10/28/12 1400   piperacillin-tazobactam (ZOSYN) IVPB 3.375 g        3.375 g 12.5 mL/hr over 240 Minutes Intravenous 3 times per day 10/28/12 1137     10/26/12 1400   fluconazole (DIFLUCAN) IVPB 200  mg        200 mg 100 mL/hr over 60 Minutes Intravenous Every 24 hours 10/25/12 1555 10/27/12 1345   10/25/12 1300   fluconazole (DIFLUCAN) IVPB 150 mg  Status:  Discontinued        150 mg 75 mL/hr over 60 Minutes Intravenous Every 24 hours 10/25/12 1146 10/25/12 1243   10/25/12 1300   sodium chloride 0.9 % 0.01 mL with fluconazole (DIFLUCAN) 150 mg infusion  Status:  Discontinued        75 mL/hr  Intravenous Every 24 hours 10/25/12 1240 10/25/12 1554   10/24/12 1230   piperacillin-tazobactam (ZOSYN) IVPB 3.375 g  Status:  Discontinued        3.375 g 12.5 mL/hr over 240 Minutes Intravenous 3 times per day 10/24/12 1157 10/26/12 1112   10/24/12 1200   vancomycin (VANCOCIN) 1,250 mg in sodium chloride 0.9 %  250 mL IVPB  Status:  Discontinued        1,250 mg 166.7 mL/hr over 90 Minutes Intravenous Every 12 hours 10/24/12 1157 10/26/12 1112          Assessment Principal Problem:  *DKA (diabetic ketoacidoses) Active Problems:  Leukocytosis  HTN (hypertension)  Diabetes mellitus  Abscess of buttock, left and smaller area on right  Acute respiratory failure    LOS: 7 days   Plan: To OR today for I & D.   Rebecca Rhodes 10/31/2012

## 2012-10-31 NOTE — Preoperative (Signed)
Beta Blockers   Reason not to administer Beta Blockers:Not Applicable 

## 2012-10-31 NOTE — Progress Notes (Signed)
1310 Pt. Transported to OR.  VSS; CHG bath completed.  No further questions asked.

## 2012-11-01 ENCOUNTER — Encounter (HOSPITAL_COMMUNITY): Payer: Self-pay | Admitting: General Surgery

## 2012-11-01 DIAGNOSIS — J9600 Acute respiratory failure, unspecified whether with hypoxia or hypercapnia: Secondary | ICD-10-CM

## 2012-11-01 DIAGNOSIS — E1110 Type 2 diabetes mellitus with ketoacidosis without coma: Secondary | ICD-10-CM

## 2012-11-01 LAB — GLUCOSE, CAPILLARY
Glucose-Capillary: 224 mg/dL — ABNORMAL HIGH (ref 70–99)
Glucose-Capillary: 208 mg/dL — ABNORMAL HIGH (ref 70–99)
Glucose-Capillary: 246 mg/dL — ABNORMAL HIGH (ref 70–99)

## 2012-11-01 LAB — COMPREHENSIVE METABOLIC PANEL
AST: 14 U/L (ref 0–37)
Albumin: 1.6 g/dL — ABNORMAL LOW (ref 3.5–5.2)
BUN: 6 mg/dL (ref 6–23)
CO2: 22 mEq/L (ref 19–32)
Calcium: 8.1 mg/dL — ABNORMAL LOW (ref 8.4–10.5)
Chloride: 101 mEq/L (ref 96–112)
Creatinine, Ser: 0.96 mg/dL (ref 0.50–1.10)
GFR calc non Af Amer: 77 mL/min — ABNORMAL LOW (ref >90)
Total Bilirubin: 0.2 mg/dL — ABNORMAL LOW (ref 0.3–1.2)

## 2012-11-01 LAB — CBC
HCT: 27.3 % — ABNORMAL LOW (ref 36.0–46.0)
Platelets: 311 10*3/uL (ref 150–400)
RDW: 15.2 % (ref 11.5–15.5)
WBC: 11 10*3/uL — ABNORMAL HIGH (ref 4.0–10.5)

## 2012-11-01 LAB — BASIC METABOLIC PANEL
Calcium: 8.3 mg/dL — ABNORMAL LOW (ref 8.4–10.5)
GFR calc Af Amer: 81 mL/min — ABNORMAL LOW (ref >90)
GFR calc non Af Amer: 70 mL/min — ABNORMAL LOW (ref >90)
Glucose, Bld: 231 mg/dL — ABNORMAL HIGH (ref 70–99)
Potassium: 4.4 mEq/L (ref 3.5–5.1)
Sodium: 135 mEq/L (ref 135–145)

## 2012-11-01 MED ORDER — SODIUM CHLORIDE 0.9 % IV SOLN
INTRAVENOUS | Status: DC
  Administered 2012-11-01: 1000 mL via INTRAVENOUS
  Administered 2012-11-02: 75 mL/h via INTRAVENOUS

## 2012-11-01 NOTE — Progress Notes (Signed)
Inpatient Diabetes Program Recommendations  AACE/ADA: New Consensus Statement on Inpatient Glycemic Control (2013)  Target Ranges:  Prepandial:   less than 140 mg/dL      Peak postprandial:   less than 180 mg/dL (1-2 hours)      Critically ill patients:  140 - 180 mg/dL   Reason for Visit: 16/10 CBGs 180-174-169-183 mg/dl         96/04   540-981 mg/dl  Inpatient Diabetes Program Recommendations Insulin - Basal: .  Increase 70/30 insulin to 35 units BID starting at supper tonight.    Note: CBGs much better on 70/30 35 units BID on 10/30/12.   CBGs greater than 180 mg/dl on 19/14 insulin 24 units BID.

## 2012-11-01 NOTE — Progress Notes (Signed)
Physical Therapy Treatment Patient Details Name: Rebecca Rhodes MRN: [id number] DOB: 06-10-1981 Today's Date: 11/01/2012 Time: 4098-1191 PT Time Calculation (min): 29 min  PT Assessment / Plan / Recommendation Comments on Treatment Session  Pt was able to tolerate ambulation well but had significant pain with bed mobility to the point of tears.  Patient also had need for BM during session.  Pt able to sit/stand to and from bed side commode but again had difficulty with pain.  Patient required multiple breaks to rest.  Feel pt will benefit from continued PT services.    Follow Up Recommendations  Home health PT;Other (comment)     Does the patient have the potential to tolerate intense rehabilitation     Barriers to Discharge        Equipment Recommendations  None recommended by PT    Recommendations for Other Services    Frequency Min 3X/week   Plan      Precautions / Restrictions Precautions Precautions: Fall Restrictions Weight Bearing Restrictions: No   Pertinent Vitals/Pain 8/10    Mobility  Bed Mobility Bed Mobility: Supine to Sit;Sitting - Scoot to Edge of Bed Supine to Sit: 4: Min guard Sitting - Scoot to [place] of Bed: 4: Min assist (secondary to significant pain poster left leg ) Details for Bed Mobility Assistance: Min (A) for LLE getting to EOB Transfers Transfers: Sit to Stand;Stand to Sit Sit to Stand: 4: Min guard;With upper extremity assist;From chair/3-in-1;From bed;With armrests Stand to Sit: 4: Min guard;With upper extremity assist;With armrests;To chair/3-in-1 Details for Transfer Assistance: Min guard for sit<>stand for safety and steadiness. Pt. with increased time d/t open boils between her legs causing pain.   Ambulation/Gait Ambulation/Gait Assistance: 4: Min guard Ambulation Distance (Feet): 160 Feet Assistive device: Rolling walker Ambulation/Gait Assistance Details: VC's for increased cadence, and rest as needed Gait Pattern: Step-through  pattern;Decreased stride length;Antalgic;Narrow base of support Gait velocity: slow Stairs: No Wheelchair Mobility Wheelchair Mobility: No      PT Goals Acute Rehab PT Goals PT Goal Formulation: With patient Time For Goal Achievement: 11/02/12 Potential to Achieve Goals: Good Pt will go Supine/Side to Sit: Independently;with HOB 0 degrees PT Goal: Supine/Side to Sit - Progress: Progressing toward goal Pt will go Sit to Stand: Independently PT Goal: Sit to Stand - Progress: Progressing toward goal Pt will Transfer Bed to Chair/Chair to Bed: Independently PT Transfer Goal: Bed to Chair/Chair to Bed - Progress: Progressing toward goal Pt will Ambulate: >150 feet;Independently PT Goal: Ambulate - Progress: Progressing toward goal Pt will Go Up / Down Stairs: 3-5 stairs;with modified independence;with least restrictive assistive device  Visit Information  Last PT Received On: 11/01/12 Assistance Needed: +1    Subjective Data  Subjective: "i walked a little earlier" Patient Stated Goal: Home   Cognition  Overall Cognitive Status: Appears within functional limits for tasks assessed/performed Arousal/Alertness: Awake/alert Orientation Level: Appears intact for tasks assessed Behavior During Session: [facility] for tasks performed    Balance     End of Session PT - End of Session Equipment Utilized During Treatment: Gait belt Activity Tolerance: Patient limited by pain Patient left: in chair;with call bell/phone within reach Nurse Communication: Mobility status;Patient requests pain meds;Other (comment) (Sensation of "bubble" in pt's chest)   GP     Fabio Asa 11/01/2012, 5:11 PM Charlotte Crumb, PT DPT  [phone]

## 2012-11-01 NOTE — Progress Notes (Signed)
1 Day Post-Op  Subjective: Doing OK, sore  Objective: Vital signs in last 24 hours: Temp:  [97.2 F (36.2 C)-99.9 F (37.7 C)] 99.7 F (37.6 C) (11/21 0922) Pulse Rate:  [92-111] 110  (11/21 0922) Resp:  [13-21] 17  (11/21 0922) BP: (118-143)/(64-97) 122/70 mmHg (11/21 0922) SpO2:  [96 %-100 %] 100 % (11/21 0548) Last BM Date: 10/30/12  Intake/Output from previous day: 11/20 0701 - 11/21 0700 In: 2075 [I.V.:1975; IV Piggyback:100] Out: -  Intake/Output this shift:    General appearance: alert and cooperative Resp: clear to auscultation bilaterally Dressings in place B proximal posterior thigh sites with serosanguinous drainage, no significant surrounding cellulitis  Lab Results:   [facility] 11/01/12 0620 10/31/12 0615  WBC 11.0* 13.8*  HGB 9.2* 9.4*  HCT 27.3* 28.1*  PLT 311 278   BMET  Basename 11/01/12 0620 10/30/12 0637  NA 135 134*  K 4.4 4.3  CL 104 107  CO2 18* 14*  GLUCOSE 231* 280*  BUN 5* 10  CREATININE 1.04 0.99  CALCIUM 8.3* 8.3*   PT/INR No results found for this basename: LABPROT:2,INR:2 in the last 72 hours ABG No results found for this basename: PHART:2,PCO2:2,PO2:2,HCO3:2 in the last 72 hours  Studies/Results: No results found.  Anti-infectives: Anti-infectives     Start     Dose/Rate Route Frequency Ordered Stop   10/28/12 1400   vancomycin (VANCOCIN) 1,250 mg in sodium chloride 0.9 % 250 mL IVPB        1,250 mg 166.7 mL/hr over 90 Minutes Intravenous Every 12 hours 10/28/12 1137     10/28/12 1400   piperacillin-tazobactam (ZOSYN) IVPB 3.375 g        3.375 g 12.5 mL/hr over 240 Minutes Intravenous 3 times per day 10/28/12 1137     10/26/12 1400   fluconazole (DIFLUCAN) IVPB 200 mg        200 mg 100 mL/hr over 60 Minutes Intravenous Every 24 hours 10/25/12 1555 10/27/12 1345   10/25/12 1300   fluconazole (DIFLUCAN) IVPB 150 mg  Status:  Discontinued        150 mg 75 mL/hr over 60 Minutes Intravenous Every 24 hours 10/25/12 1146  10/25/12 1243   10/25/12 1300   sodium chloride 0.9 % 0.01 mL with fluconazole (DIFLUCAN) 150 mg infusion  Status:  Discontinued        75 mL/hr  Intravenous Every 24 hours 10/25/12 1240 10/25/12 1554   10/24/12 1230   piperacillin-tazobactam (ZOSYN) IVPB 3.375 g  Status:  Discontinued        3.375 g 12.5 mL/hr over 240 Minutes Intravenous 3 times per day 10/24/12 1157 10/26/12 1112   10/24/12 1200   vancomycin (VANCOCIN) 1,250 mg in sodium chloride 0.9 % 250 mL IVPB  Status:  Discontinued        1,250 mg 166.7 mL/hr over 90 Minutes Intravenous Every 12 hours 10/24/12 1157 10/26/12 1112          Assessment/Plan: s/p Procedure(s) (LRB) with comments: IRRIGATION AND DEBRIDEMENT ABSCESS (Bilateral) S/P I&D B posterior thigh abscesses - start packing changes tomorrow per Dr. Lindie Spruce ID - cultures P, Vanc/Zosyn  LOS: 8 days    Rebecca Rhodes E 11/01/2012

## 2012-11-01 NOTE — Progress Notes (Addendum)
[facility] PROGRESS NOTE  Rebecca Rhodes MWU:[id number] DOB: 1981/01/21 DOA: 10/24/2012 PCP: Georganna Skeans, MD  Assessment/Plan: Principal Problem:  *DKA (diabetic ketoacidoses) Active Problems:  Leukocytosis  HTN (hypertension)  Diabetes mellitus  Abscess of buttock, left  Acute respiratory failure     Acute respiratory failure status post extubation, respiratory status is stable   Hypertension continue ACE inhibitor, when necessary labetalol   Acute renal insufficiency creatinine went up to 1.49, now 0.99 with IV hydration   Diabetic ketoacidosis patient had a recurrence of her DKA with an anion gap had gone up to 22 due to presence of cellulitis, now improved. Anion gap significantly improve, 11 this morning. Was transitioned to NPH and sliding scale insulin , and change dose to 25 units twice a day and the patient is n.p.o. today . Uncontrolled CBGs due to infection  Hemoglobin A1c of greater than 20   Anemia hemoglobin has come down from 16.0 to 9.4 monitor for bleeding   Leukocytosis initially thought to have a boil, was on vancomycin and Zosyn which have been resumed as cellulitis appears to be still present , CT scan shows cellulitis and proximal aspect of both thighs. Surgery consultation obtained , status post Drainage of posterior/medial thigh abscess,15 x 12 cm in size. Await for culture data to come back before tapering antibiotics    Metabolic acidosis, suspected RTA, will continue the patient on sodium bicarbonate tablets   Acute metabolic encephalopathy secondary to hypertensive encephalopathy now improved  Hypokalemia replete   Code Status: full  Family Communication: family updated about patient's clinical progress  Disposition Plan abscess drainage today    Brief narrative:  Rebecca Rhodes is a 31 yo female who presented to [facility] via EMS on 11/13 with a chief complaint of shortness of breath. She was found to be in DKA with a AG of 20.1. The patient  was placed on CPAP in the ER, given 2L of NS and an insulin drip was ordered. PCCM was called for admission and further management  Interim Events: Extubated, awake but drowsy, no complaints this AM  LINES / TUBES:  Left CVC IJ 11/13 >>>  Foley 11/13 >>>  oett 11/13>>> 11/15  CULTURES:  Blood x2 11/13 >>>  Urine 11/13 >>> neg  Sputum 11/13 >>>  ANTIBIOTICS:  Zosyn 11/13 >>> 11/15  Vanc 11/13 >>> 11/15  SIGNIFICANT EVENTS:  11/13: Intubated/1114- improved ph, neurostatus  11/4 Extubated, gap closed    HPI/Subjective:  Leg pain from her from her folliculitis      Objective: Filed Vitals:   11/01/12 0548 11/01/12 0922 11/01/12 1000 11/01/12 1002  BP: 118/79 122/70 144/68 119/71  Pulse:  110  106  Temp: 99.9 F (37.7 C) 99.7 F (37.6 C)  98.7 F (37.1 C)  TempSrc: Oral Oral    Resp: 18 17  18   Height:      Weight:      SpO2: 100%   99%    Intake/Output Summary (Last 24 hours) at 11/01/12 1104 Last data filed at 11/01/12 0900  Gross per 24 hour  Intake   2315 ml  Output      0 ml  Net   2315 ml    Exam:  HENT:  Head: Atraumatic.  Nose: Nose normal.  Mouth/Throat: Oropharynx is clear and moist.  Eyes: Conjunctivae are normal. Pupils are equal, round, and reactive to light. No scleral icterus.  Neck: Neck supple. No tracheal deviation present.  Cardiovascular: Normal rate, regular rhythm, normal heart sounds and  intact distal pulses.  Pulmonary/Chest: Effort normal and breath sounds normal. No respiratory distress.  Abdominal: Soft. Normal appearance and bowel sounds are normal. She exhibits no distension. There is no tenderness.  Musculoskeletal: She exhibits no edema and no tenderness.  Neurological: She is alert. No cranial nerve deficit.    Data Reviewed: Basic Metabolic Panel:  Lab 11/01/12 1610 10/30/12 0637 10/28/12 2030 10/28/12 0855 10/28/12 0242  NA 135 134* 134* 139 136  K 4.4 4.3 3.7 3.3* 2.8*  CL 104 107 107 110 108  CO2 18* 14* 16* 19 17*    GLUCOSE 231* 280* 301* 158* 144*  BUN 5* 10 11 10 11   CREATININE 1.04 0.99 1.01 0.87 0.90  CALCIUM 8.3* 8.3* 8.1* 8.3* 8.4  MG -- -- -- 1.8 --  PHOS -- -- -- -- --    Liver Function Tests: No results found for this basename: AST:5,ALT:5,ALKPHOS:5,BILITOT:5,PROT:5,ALBUMIN:5 in the last 168 hours No results found for this basename: LIPASE:5,AMYLASE:5 in the last 168 hours No results found for this basename: AMMONIA:5 in the last 168 hours  CBC:  Lab 11/01/12 0620 10/31/12 0615 10/30/12 0637 10/28/12 0242 10/27/12 0926  WBC 11.0* 13.8* 14.3* 13.6* 13.2*  NEUTROABS -- -- -- -- --  HGB 9.2* 9.4* 9.4* 9.7* 10.4*  HCT 27.3* 28.1* 28.6* 28.8* 31.0*  MCV 88.6 87.5 87.2 86.0 88.1  PLT 311 278 253 199 213    Cardiac Enzymes: No results found for this basename: CKTOTAL:5,CKMB:5,CKMBINDEX:5,TROPONINI:5 in the last 168 hours BNP (last 3 results) No results found for this basename: PROBNP:3 in the last 8760 hours   CBG:  Lab 11/01/12 0804 10/31/12 2219 10/31/12 1820 10/31/12 1551 10/31/12 0756  GLUCAP 224* 183* 169* 174* 180*    Recent Results (from the past 240 hour(s))  URINE CULTURE     Status: Normal   Collection Time   10/24/12  8:47 AM      Component Value Range Status Comment   Specimen Description URINE, RANDOM   Final    Special Requests ADDED AT 0847 ON 0852   Final    Culture  Setup Time 10/24/2012 17:22   Final    Colony Count NO GROWTH   Final    Culture NO GROWTH   Final    Report Status 10/25/2012 FINAL   Final   CULTURE, BLOOD (ROUTINE X 2)     Status: Normal   Collection Time   10/24/12 11:18 AM      Component Value Range Status Comment   Specimen Description BLOOD ARM RIGHT   Final    Special Requests BOTTLES DRAWN AEROBIC AND ANAEROBIC 10CC   Final    Culture  Setup Time 10/24/2012 18:55   Final    Culture NO GROWTH 5 DAYS   Final    Report Status 10/30/2012 FINAL   Final   CULTURE, BLOOD (ROUTINE X 2)     Status: Normal   Collection Time   10/24/12  11:25 AM      Component Value Range Status Comment   Specimen Description BLOOD HAND RIGHT   Final    Special Requests BOTTLES DRAWN AEROBIC AND ANAEROBIC 10CC   Final    Culture  Setup Time 10/24/2012 18:55   Final    Culture NO GROWTH 5 DAYS   Final    Report Status 10/30/2012 FINAL   Final   MRSA PCR SCREENING     Status: Normal   Collection Time   10/24/12 11:52 AM  Component Value Range Status Comment   MRSA by PCR NEGATIVE  NEGATIVE Final   CULTURE, RESPIRATORY     Status: Normal   Collection Time   10/25/12 10:04 AM      Component Value Range Status Comment   Specimen Description TRACHEAL ASPIRATE   Final    Special Requests NONE   Final    Gram Stain     Final    Value: ABUNDANT WBC PRESENT, PREDOMINANTLY PMN     NO SQUAMOUS EPITHELIAL CELLS SEEN     RARE GRAM POSITIVE COCCI IN PAIRS   Culture Non-Pathogenic Oropharyngeal-type Flora Isolated.   Final    Report Status 10/27/2012 FINAL   Final   CULTURE, ROUTINE-ABSCESS     Status: Normal (Preliminary result)   Collection Time   10/31/12 12:56 PM      Component Value Range Status Comment   Specimen Description ABSCESS THIGH RIGHT   Final    Special Requests NONE   Final    Gram Stain     Final    Value: FEW WBC PRESENT,BOTH PMN AND MONONUCLEAR     NO SQUAMOUS EPITHELIAL CELLS SEEN     RARE GRAM POSITIVE COCCI   Culture NO GROWTH 1 DAY   Final    Report Status PENDING   Incomplete   ANAEROBIC CULTURE     Status: Normal (Preliminary result)   Collection Time   10/31/12 12:56 PM      Component Value Range Status Comment   Specimen Description ABSCESS THIGH RIGHT   Final    Special Requests NONE   Final    Gram Stain PENDING   Incomplete    Culture     Final    Value: NO ANAEROBES ISOLATED; CULTURE IN PROGRESS FOR 5 DAYS   Report Status PENDING   Incomplete   CULTURE, ROUTINE-ABSCESS     Status: Normal (Preliminary result)   Collection Time   10/31/12 12:56 PM      Component Value Range Status Comment    Specimen Description ABSCESS LEFT THIGH   Final    Special Requests NONE   Final    Gram Stain     Final    Value: FEW WBC PRESENT,BOTH PMN AND MONONUCLEAR     RARE SQUAMOUS EPITHELIAL CELLS PRESENT     NO ORGANISMS SEEN   Culture NO GROWTH 1 DAY   Final    Report Status PENDING   Incomplete   ANAEROBIC CULTURE     Status: Normal (Preliminary result)   Collection Time   10/31/12 12:56 PM      Component Value Range Status Comment   Specimen Description ABSCESS LEFT THIGH   Final    Special Requests NONE   Final    Gram Stain PENDING   Incomplete    Culture     Final    Value: NO ANAEROBES ISOLATED; CULTURE IN PROGRESS FOR 5 DAYS   Report Status PENDING   Incomplete      Studies: Ct Femur Left W Contrast  10/29/2012  *RADIOLOGY REPORT*  Clinical Data: Upper thigh pain.  Cellulitis.  Leukocytosis.  CT OF THE LEFT FEMUR WITH CONTRAST  Contrast: OMNIPAQUE IOHEXOL 300 MG/ML  SOLN  Comparison: None.  Findings: There are focal areas of cellulitis in the proximal medial aspects of both thighs, more prominent on the left than the right.  There is a poorly defined fluid collection in the soft tissues of the medial aspect of the left thigh measuring  8.4 x 4.2 x 3.2 cm.  This is not a well defined abscess.  There is prominent edema in the adjacent subcutaneous fat consistent with cellulitis.  There is a slightly more anterior area of subcutaneous edema, poorly defined in the proximal right thigh.  The adjacent muscle structures appear normal.  There is slight reactive adenopathy in the left inguinal region.  No acute osseous abnormality.  There is a small nonspecific left knee effusion.  IMPRESSION: Cellulitis in the proximal medial aspects of both thighs, more prominent on the left than the right.  There are poorly defined fluid collections in the subcutaneous fat without a well marginated abscess. I suspect some pus could be obtained from the proximal left thigh but the fluid collection is not well  defined.   Original Report Authenticated By: Francene Boyers, M.D.    Dg Chest Portable 1 View  10/24/2012  *RADIOLOGY REPORT*  Clinical Data: ETT placement  PORTABLE CHEST - 1 VIEW  Comparison: 10/24/2012 at 0757 hours  Findings: Endotracheal tube terminates 5 cm above the carina.  Left IJ venous catheter terminates at/just above the cavoatrial junction.  Lungs are essentially clear.  No pleural effusion or pneumothorax.  The heart is normal in size.  IMPRESSION: Endotracheal tube terminates 5 cm above the carina.  Left IJ venous catheter terminates at/just above the cavoatrial junction.  No pneumothorax.   Original Report Authenticated By: Charline Bills, M.D.    Dg Chest Port 1 View  10/24/2012  *RADIOLOGY REPORT*  Clinical Data: Shortness of breath.  PORTABLE CHEST - 1 VIEW  Comparison: 06/05/2012  Findings: The heart size and pulmonary vascularity are normal and the lungs are clear.  No osseous abnormality.  IMPRESSION: Normal chest.   Original Report Authenticated By: Francene Boyers, M.D.     Scheduled Meds:   . antiseptic oral rinse  15 mL Mouth Rinse QID  . chlorhexidine  15 mL Mouth/Throat BID  . enoxaparin  40 mg Subcutaneous Q24H  . fentaNYL  200 mcg Intravenous Once  . [EXPIRED] HYDROmorphone      . insulin aspart  0-20 Units Subcutaneous TID WC  . insulin aspart protamine-insulin aspart  24 Units Subcutaneous BID WC  . labetalol  100 mg Oral BID  . lisinopril  10 mg Oral Daily  . pantoprazole  40 mg Oral Daily  . piperacillin-tazobactam (ZOSYN)  IV  3.375 g Intravenous Q8H  . potassium chloride  40 mEq Oral BID  . sodium bicarbonate  650 mg Oral BID  . vancomycin  1,250 mg Intravenous Q12H  . [DISCONTINUED] insulin aspart protamine-insulin aspart  35 Units Subcutaneous BID WC   Continuous Infusions:   . sodium chloride    . [DISCONTINUED] 0.9 % NaCl with KCl 40 mEq / L 75 mL/hr (10/31/12 2352)  . [DISCONTINUED] lactated ringers 50 mL/hr at 10/31/12 1331    Principal  Problem:  *DKA (diabetic ketoacidoses) Active Problems:  Leukocytosis  HTN (hypertension)  Diabetes mellitus  Abscess of buttock, left  Acute respiratory failure    Time spent: 40 minutes   [facility]  [facility] Pager [phone]. If 8PM-8AM, please contact night-coverage at [email or web address], password [facility] 11/01/2012, 11:04 AM  LOS: 8 days

## 2012-11-02 LAB — GLUCOSE, CAPILLARY

## 2012-11-02 LAB — CBC
Hemoglobin: 8.9 g/dL — ABNORMAL LOW (ref 12.0–15.0)
MCHC: 33.1 g/dL (ref 30.0–36.0)
Platelets: 326 10*3/uL (ref 150–400)
RBC: 3.04 MIL/uL — ABNORMAL LOW (ref 3.87–5.11)

## 2012-11-02 MED ORDER — MIDAZOLAM HCL 2 MG/2ML IJ SOLN
INTRAMUSCULAR | Status: AC
  Administered 2012-11-02: 2 mg via INTRAVENOUS
  Filled 2012-11-02: qty 2

## 2012-11-02 MED ORDER — INSULIN ASPART PROT & ASPART (70-30 MIX) 100 UNIT/ML ~~LOC~~ SUSP
30.0000 [IU] | Freq: Two times a day (BID) | SUBCUTANEOUS | Status: DC
  Administered 2012-11-02 – 2012-11-06 (×7): 30 [IU] via SUBCUTANEOUS
  Filled 2012-11-02 (×2): qty 3

## 2012-11-02 MED ORDER — MIDAZOLAM HCL 2 MG/2ML IJ SOLN
INTRAMUSCULAR | Status: AC
  Administered 2012-11-02: 1 mg via INTRAVENOUS
  Filled 2012-11-02: qty 2

## 2012-11-02 MED ORDER — DIPHENHYDRAMINE HCL 50 MG/ML IJ SOLN
25.0000 mg | Freq: Four times a day (QID) | INTRAMUSCULAR | Status: DC | PRN
  Administered 2012-11-02 – 2012-11-05 (×7): 25 mg via INTRAVENOUS
  Filled 2012-11-02 (×7): qty 1

## 2012-11-02 MED ORDER — MORPHINE SULFATE 4 MG/ML IJ SOLN
INTRAMUSCULAR | Status: AC
  Administered 2012-11-02: 2 mg via INTRAVENOUS
  Filled 2012-11-02: qty 1

## 2012-11-02 MED ORDER — MORPHINE SULFATE 4 MG/ML IJ SOLN
INTRAMUSCULAR | Status: AC
  Administered 2012-11-02: 4 mg via INTRAMUSCULAR
  Filled 2012-11-02: qty 1

## 2012-11-02 NOTE — Progress Notes (Signed)
Physical Therapy Treatment Patient Details Name: Rebecca Rhodes MRN: [id number] DOB: October 01, 1981 Today's Date: 11/02/2012 Time: 0865-7846 PT Time Calculation (min): 30 min  PT Assessment / Plan / Recommendation Comments on Treatment Session  Pt. participated with PT session with max encouragment and increased time for bed mobility as she states, "I'm still waking up from procedure this morning". Pt. able to ambulate into the hallway with RW and denies pain and reports she had had pain medication recently.     Follow Up Recommendations  Home health PT;Other (comment)     Does the patient have the potential to tolerate intense rehabilitation     Barriers to Discharge        Equipment Recommendations  None recommended by PT    Recommendations for Other Services    Frequency Min 3X/week   Plan      Precautions / Restrictions Precautions Precautions: Fall Restrictions Weight Bearing Restrictions: No   Pertinent Vitals/Pain Patient denies pain during session, just feeling sleepy and "waking up from procedure". Patient reports she has had pain medication.    Mobility  Bed Mobility Bed Mobility: Supine to Sit;Sitting - Scoot to Edge of Bed Supine to Sit: 4: Min guard Sitting - Scoot to [place] of Bed: 4: Min assist Details for Bed Mobility Assistance: Pt. able to get to EOB with increased time and max encouragement. Pt. with min (A) with getting closer to the EOB as she was still appearing lathargic and starting to "feel more awake" Transfers Transfers: Sit to Stand;Stand to Sit Sit to Stand: 4: Min assist;4: Min guard;With upper extremity assist;With armrests;From bed Stand to Sit: 4: Min guard;With upper extremity assist;With armrests;To chair/3-in-1 Details for Transfer Assistance: Pt. min guard for sit<>stands with min (A) initially for rising as pt. stated that she was "still waking up from procedure". Pt. given VC for proper hand placement when standing to and sitting from RW.    Ambulation/Gait Ambulation/Gait Assistance: 4: Min guard Ambulation Distance (Feet): 150 Feet Assistive device: Rolling walker Ambulation/Gait Assistance Details: Pt. given VC for increasing distance if she felt as though she could. Cues for safe use of RW & to keep both UE's placed on RW.    Gait Pattern: Step-through pattern;Decreased stride length;Narrow base of support Gait velocity: slow Stairs: No Wheelchair Mobility Wheelchair Mobility: No      PT Goals Acute Rehab PT Goals PT Goal Formulation: With patient Time For Goal Achievement: 11/02/12 Potential to Achieve Goals: Good Pt will go Supine/Side to Sit: Independently;with HOB 0 degrees PT Goal: Supine/Side to Sit - Progress: Progressing toward goal Pt will go Sit to Stand: Independently PT Goal: Sit to Stand - Progress: Progressing toward goal Pt will Transfer Bed to Chair/Chair to Bed: Independently PT Transfer Goal: Bed to Chair/Chair to Bed - Progress: Progressing toward goal Pt will Ambulate: >150 feet;Independently PT Goal: Ambulate - Progress: Progressing toward goal Pt will Go Up / Down Stairs: 3-5 stairs;with modified independence;with least restrictive assistive device  Visit Information  Last PT Received On: 11/02/12 Assistance Needed: +1    Subjective Data  Subjective: "I am fine" Patient Stated Goal: Home   Cognition  Overall Cognitive Status: Appears within functional limits for tasks assessed/performed Arousal/Alertness: Lethargic Orientation Level: Appears intact for tasks assessed Behavior During Session: Lethargic Cognition - Other Comments: Pt. presented very lathragic and began to wake up as she began getting to EOB.    Balance     End of Session PT - End of Session Equipment Utilized  During Treatment: Gait belt Activity Tolerance: Patient tolerated treatment well Patient left: in chair;with call bell/phone within reach Nurse Communication: Mobility status    Mertie Clause,  SPTA 11/02/2012, 2:37 PM   Verdell Face, PTA [phone] 11/02/2012

## 2012-11-02 NOTE — Progress Notes (Signed)
Packing removed.  Patient doing okay.  Should be able to go home soon.  Marta Lamas. Gae Bon, MD, FACS [phone] [facility]

## 2012-11-02 NOTE — Progress Notes (Signed)
[facility] PROGRESS NOTE  Rebecca Rhodes ZOX:[id number] DOB: 1981/03/13 DOA: 10/24/2012 PCP: Georganna Skeans, MD  Assessment/Plan: Principal Problem:  *DKA (diabetic ketoacidoses) Active Problems:  Leukocytosis  HTN (hypertension)  Diabetes mellitus  Abscess of buttock, left  Acute respiratory failure    Acute respiratory failure status post extubation, respiratory status is stable  Hypertension continue ACE inhibitor, when necessary labetalol  Acute renal insufficiency creatinine went up to 1.49, now 0.99 with IV hydration    Diabetic ketoacidosis patient had a recurrence of her DKA with an anion gap had gone up to 22 due to presence of cellulitis, now improved. Anion gap significantly improve, 11 this morning. Continue NPH, sliding scale insulin, increase dose to 30 units twice a day. Uncontrolled CBGs due to infection , now improving Hemoglobin A1c of greater than 20   Anemia hemoglobin has come down from 16.0 to 9.4 monitor for bleeding    Leukocytosis initially thought to have a boil, was on vancomycin and Zosyn which have been resumed as cellulitis appears to be still present , CT scan shows cellulitis and proximal aspect of both thighs. Surgery consultation obtained , status post  Drainage of posterior/medial thigh abscess,15 x 12 cm in size. Await for culture data to come back before tapering antibiotics . Dressing changes by Sherrie George, PA and the conscious sedation  Metabolic acidosis, suspected RTA, will continue the patient on sodium bicarbonate tablets    Acute metabolic encephalopathy secondary to hypertensive encephalopathy now improved    Hypokalemia replete    Code Status: full  Family Communication: family updated about patient's clinical progress  Disposition may need SNF because of IV antibiotics and dressing changes   Brief narrative:  Rebecca Rhodes is a 31 yo female who presented to [facility] via EMS on 11/13 with a chief complaint of shortness of  breath. She was found to be in DKA with a AG of 20.1. The patient was placed on CPAP in the ER, given 2L of NS and an insulin drip was ordered. PCCM was called for admission and further management  Interim Events: Extubated, awake but drowsy, no complaints this AM  LINES / TUBES:  Left CVC IJ 11/13 >>>  Foley 11/13 >>>  oett 11/13>>> 11/15  CULTURES:  Blood x2 11/13 >>>  Urine 11/13 >>> neg  Sputum 11/13 >>>  ANTIBIOTICS:  Zosyn 11/13 >>> 11/15  Vanc 11/13 >>> 11/15  SIGNIFICANT EVENTS:  11/13: Intubated/1114- improved ph, neurostatus  11/4 Extubated, gap closed       HPI/Subjective: No complaints  Objective: Filed Vitals:   11/01/12 2211 11/02/12 0216 11/02/12 0643 11/02/12 1000  BP: 118/77 122/73 132/75 135/88  Pulse: 94 91 88 95  Temp: 98.9 F (37.2 C) 98.5 F (36.9 C) 98.1 F (36.7 C)   TempSrc: Oral     Resp: 18 18 18 18   Height:      Weight:      SpO2: 100% 100% 100% 100%    Intake/Output Summary (Last 24 hours) at 11/02/12 1133 Last data filed at 11/02/12 0700  Gross per 24 hour  Intake   2650 ml  Output   1000 ml  Net   1650 ml    Exam:  HENT:  Head: Atraumatic.  Nose: Nose normal.  Mouth/Throat: Oropharynx is clear and moist.  Eyes: Conjunctivae are normal. Pupils are equal, round, and reactive to light. No scleral icterus.  Neck: Neck supple. No tracheal deviation present.  Cardiovascular: Normal rate, regular rhythm, normal heart sounds and intact distal  pulses.  Pulmonary/Chest: Effort normal and breath sounds normal. No respiratory distress.  Abdominal: Soft. Normal appearance and bowel sounds are normal. She exhibits no distension. There is no tenderness.  Musculoskeletal: She exhibits no edema and no tenderness.  Neurological: She is alert. No cranial nerve deficit.    Data Reviewed: Basic Metabolic Panel:  Lab 11/01/12 1610 11/01/12 0620 10/30/12 0637 10/28/12 2030 10/28/12 0855  NA 132* 135 134* 134* 139  K 4.1 4.4 4.3 3.7 3.3*  CL  101 104 107 107 110  CO2 22 18* 14* 16* 19  GLUCOSE 233* 231* 280* 301* 158*  BUN 6 5* 10 11 10   CREATININE 0.96 1.04 0.99 1.01 0.87  CALCIUM 8.1* 8.3* 8.3* 8.1* 8.3*  MG -- -- -- -- 1.8  PHOS -- -- -- -- --    Liver Function Tests:  Lab 11/01/12 1413  AST 14  ALT <5  ALKPHOS 56  BILITOT 0.2*  PROT 5.8*  ALBUMIN 1.6*   No results found for this basename: LIPASE:5,AMYLASE:5 in the last 168 hours No results found for this basename: AMMONIA:5 in the last 168 hours  CBC:  Lab 11/02/12 0611 11/01/12 0620 10/31/12 0615 10/30/12 0637 10/28/12 0242  WBC 9.7 11.0* 13.8* 14.3* 13.6*  NEUTROABS -- -- -- -- --  HGB 8.9* 9.2* 9.4* 9.4* 9.7*  HCT 26.9* 27.3* 28.1* 28.6* 28.8*  MCV 88.5 88.6 87.5 87.2 86.0  PLT 326 311 278 253 199    Cardiac Enzymes: No results found for this basename: CKTOTAL:5,CKMB:5,CKMBINDEX:5,TROPONINI:5 in the last 168 hours BNP (last 3 results) No results found for this basename: PROBNP:3 in the last 8760 hours   CBG:  Lab 11/02/12 0809 11/01/12 2221 11/01/12 1723 11/01/12 1200 11/01/12 0804  GLUCAP 126* 154* 246* 208* 224*    Recent Results (from the past 240 hour(s))  URINE CULTURE     Status: Normal   Collection Time   10/24/12  8:47 AM      Component Value Range Status Comment   Specimen Description URINE, RANDOM   Final    Special Requests ADDED AT 0847 ON 0852   Final    Culture  Setup Time 10/24/2012 17:22   Final    Colony Count NO GROWTH   Final    Culture NO GROWTH   Final    Report Status 10/25/2012 FINAL   Final   CULTURE, BLOOD (ROUTINE X 2)     Status: Normal   Collection Time   10/24/12 11:18 AM      Component Value Range Status Comment   Specimen Description BLOOD ARM RIGHT   Final    Special Requests BOTTLES DRAWN AEROBIC AND ANAEROBIC 10CC   Final    Culture  Setup Time 10/24/2012 18:55   Final    Culture NO GROWTH 5 DAYS   Final    Report Status 10/30/2012 FINAL   Final   CULTURE, BLOOD (ROUTINE X 2)     Status: Normal    Collection Time   10/24/12 11:25 AM      Component Value Range Status Comment   Specimen Description BLOOD HAND RIGHT   Final    Special Requests BOTTLES DRAWN AEROBIC AND ANAEROBIC 10CC   Final    Culture  Setup Time 10/24/2012 18:55   Final    Culture NO GROWTH 5 DAYS   Final    Report Status 10/30/2012 FINAL   Final   MRSA PCR SCREENING     Status: Normal   Collection Time  10/24/12 11:52 AM      Component Value Range Status Comment   MRSA by PCR NEGATIVE  NEGATIVE Final   CULTURE, RESPIRATORY     Status: Normal   Collection Time   10/25/12 10:04 AM      Component Value Range Status Comment   Specimen Description TRACHEAL ASPIRATE   Final    Special Requests NONE   Final    Gram Stain     Final    Value: ABUNDANT WBC PRESENT, PREDOMINANTLY PMN     NO SQUAMOUS EPITHELIAL CELLS SEEN     RARE GRAM POSITIVE COCCI IN PAIRS   Culture Non-Pathogenic Oropharyngeal-type Flora Isolated.   Final    Report Status 10/27/2012 FINAL   Final   CULTURE, ROUTINE-ABSCESS     Status: Normal (Preliminary result)   Collection Time   10/31/12 12:56 PM      Component Value Range Status Comment   Specimen Description ABSCESS THIGH RIGHT   Final    Special Requests NONE   Final    Gram Stain     Final    Value: FEW WBC PRESENT,BOTH PMN AND MONONUCLEAR     NO SQUAMOUS EPITHELIAL CELLS SEEN     RARE GRAM POSITIVE COCCI   Culture NO GROWTH 2 DAYS   Final    Report Status PENDING   Incomplete   ANAEROBIC CULTURE     Status: Normal (Preliminary result)   Collection Time   10/31/12 12:56 PM      Component Value Range Status Comment   Specimen Description ABSCESS THIGH RIGHT   Final    Special Requests NONE   Final    Gram Stain PENDING   Incomplete    Culture     Final    Value: NO ANAEROBES ISOLATED; CULTURE IN PROGRESS FOR 5 DAYS   Report Status PENDING   Incomplete   AFB CULTURE WITH SMEAR     Status: Normal (Preliminary result)   Collection Time   10/31/12 12:56 PM      Component Value  Range Status Comment   Specimen Description ABSCESS RIGHT THIGH   Final    Special Requests NONE   Final    ACID FAST SMEAR NO ACID FAST BACILLI SEEN   Final    Culture     Final    Value: CULTURE WILL BE EXAMINED FOR 6 WEEKS BEFORE ISSUING A FINAL REPORT   Report Status PENDING   Incomplete   CULTURE, ROUTINE-ABSCESS     Status: Normal (Preliminary result)   Collection Time   10/31/12 12:56 PM      Component Value Range Status Comment   Specimen Description ABSCESS LEFT THIGH   Final    Special Requests NONE   Final    Gram Stain     Final    Value: FEW WBC PRESENT,BOTH PMN AND MONONUCLEAR     RARE SQUAMOUS EPITHELIAL CELLS PRESENT     NO ORGANISMS SEEN   Culture NO GROWTH 2 DAYS   Final    Report Status PENDING   Incomplete   ANAEROBIC CULTURE     Status: Normal (Preliminary result)   Collection Time   10/31/12 12:56 PM      Component Value Range Status Comment   Specimen Description ABSCESS LEFT THIGH   Final    Special Requests NONE   Final    Gram Stain PENDING   Incomplete    Culture     Final    Value: NO  ANAEROBES ISOLATED; CULTURE IN PROGRESS FOR 5 DAYS   Report Status PENDING   Incomplete   AFB CULTURE WITH SMEAR     Status: Normal (Preliminary result)   Collection Time   10/31/12 12:56 PM      Component Value Range Status Comment   Specimen Description ABSCESS LEFT THIGH   Final    Special Requests NONE   Final    ACID FAST SMEAR NO ACID FAST BACILLI SEEN   Final    Culture     Final    Value: CULTURE WILL BE EXAMINED FOR 6 WEEKS BEFORE ISSUING A FINAL REPORT   Report Status PENDING   Incomplete      Studies: Ct Femur Left W Contrast  10/29/2012  *RADIOLOGY REPORT*  Clinical Data: Upper thigh pain.  Cellulitis.  Leukocytosis.  CT OF THE LEFT FEMUR WITH CONTRAST  Contrast: OMNIPAQUE IOHEXOL 300 MG/ML  SOLN  Comparison: None.  Findings: There are focal areas of cellulitis in the proximal medial aspects of both thighs, more prominent on the left than the  right.  There is a poorly defined fluid collection in the soft tissues of the medial aspect of the left thigh measuring 8.4 x 4.2 x 3.2 cm.  This is not a well defined abscess.  There is prominent edema in the adjacent subcutaneous fat consistent with cellulitis.  There is a slightly more anterior area of subcutaneous edema, poorly defined in the proximal right thigh.  The adjacent muscle structures appear normal.  There is slight reactive adenopathy in the left inguinal region.  No acute osseous abnormality.  There is a small nonspecific left knee effusion.  IMPRESSION: Cellulitis in the proximal medial aspects of both thighs, more prominent on the left than the right.  There are poorly defined fluid collections in the subcutaneous fat without a well marginated abscess. I suspect some pus could be obtained from the proximal left thigh but the fluid collection is not well defined.   Original Report Authenticated By: Francene Boyers, M.D.    Dg Chest Portable 1 View  10/24/2012  *RADIOLOGY REPORT*  Clinical Data: ETT placement  PORTABLE CHEST - 1 VIEW  Comparison: 10/24/2012 at 0757 hours  Findings: Endotracheal tube terminates 5 cm above the carina.  Left IJ venous catheter terminates at/just above the cavoatrial junction.  Lungs are essentially clear.  No pleural effusion or pneumothorax.  The heart is normal in size.  IMPRESSION: Endotracheal tube terminates 5 cm above the carina.  Left IJ venous catheter terminates at/just above the cavoatrial junction.  No pneumothorax.   Original Report Authenticated By: Charline Bills, M.D.    Dg Chest Port 1 View  10/24/2012  *RADIOLOGY REPORT*  Clinical Data: Shortness of breath.  PORTABLE CHEST - 1 VIEW  Comparison: 06/05/2012  Findings: The heart size and pulmonary vascularity are normal and the lungs are clear.  No osseous abnormality.  IMPRESSION: Normal chest.   Original Report Authenticated By: Francene Boyers, M.D.     Scheduled Meds:   . antiseptic oral  rinse  15 mL Mouth Rinse QID  . chlorhexidine  15 mL Mouth/Throat BID  . enoxaparin  40 mg Subcutaneous Q24H  . fentaNYL  200 mcg Intravenous Once  . insulin aspart  0-20 Units Subcutaneous TID WC  . insulin aspart protamine-insulin aspart  24 Units Subcutaneous BID WC  . labetalol  100 mg Oral BID  . lisinopril  10 mg Oral Daily  . [COMPLETED] midazolam      . [  COMPLETED] midazolam      . [COMPLETED] morphine      . [COMPLETED] morphine      . pantoprazole  40 mg Oral Daily  . piperacillin-tazobactam (ZOSYN)  IV  3.375 g Intravenous Q8H  . potassium chloride  40 mEq Oral BID  . sodium bicarbonate  650 mg Oral BID  . vancomycin  1,250 mg Intravenous Q12H   Continuous Infusions:   . sodium chloride 75 mL/hr (11/02/12 0311)    Principal Problem:  *DKA (diabetic ketoacidoses) Active Problems:  Leukocytosis  HTN (hypertension)  Diabetes mellitus  Abscess of buttock, left  Acute respiratory failure    Time spent: 40 minutes   [facility]  [facility] Pager [phone]. If 8PM-8AM, please contact night-coverage at [email or web address], password [facility] 11/02/2012, 11:33 AM  LOS: 9 days

## 2012-11-02 NOTE — Clinical Social Work Psychosocial (Signed)
     Clinical Social Work Department BRIEF PSYCHOSOCIAL ASSESSMENT 11/02/2012  Patient:  Rebecca Rhodes, Rebecca Rhodes     Account Number:  [id number]     Admit date:  10/24/2012  Clinical Social Worker:  Hulan Fray  Date/Time:  11/02/2012 02:40 PM  Referred by:  Physician  Date Referred:  11/02/2012 Referred for  SNF Placement   Other Referral:   Interview type:  Patient Other interview type:    PSYCHOSOCIAL DATA Living Status:  SIGNIFICANT OTHER Admitted from facility:   Level of care:   Primary support name:  [name] Primary support relationship to patient:  PARTNER Degree of support available:   adequate    CURRENT CONCERNS Current Concerns  Post-Acute Placement   Other Concerns:    SOCIAL WORK ASSESSMENT / PLAN Clinical Social Worker received referral for SNF placement for patient, for IV antibiotics and dressing changes. CSW introduced self and explained reason for visit. Patient was agreeable for CSW to initiate SNF search for IV antibiotics and dressing changes at a SNF. Patient was agreeable for CSW to initiate this search. Patient was agreeable for CSW to call financial counselor for [facility] application. CSW spoke with financial counselor and reported that patient may not be eligible for medicaid. This is a barrier for SNF placement as patient currently does not have insurance. CSW will complete FL2 for MD's signature and continue to follow.   Assessment/plan status:  Psychosocial Support/Ongoing Assessment of Needs Other assessment/ plan:   Information/referral to community resources:   SNF packet    PATIENTS/FAMILYS RESPONSE TO PLAN OF CARE: Patient was agreeable for CSW to initiate SNF seach and call financial counselor for [facility] application.

## 2012-11-02 NOTE — Clinical Social Work Placement (Signed)
     Clinical Social Work Department CLINICAL SOCIAL WORK PLACEMENT NOTE 11/02/2012  Patient:  Rebecca Rhodes, Rebecca Rhodes  Account Number:  [id number] Admit date:  10/24/2012  Clinical Social Worker:  Hulan Fray  Date/time:  11/02/2012 04:49 PM  Clinical Social Work is seeking post-discharge placement for this patient at the following level of care:   SKILLED NURSING   (*CSW will update this form in Epic as items are completed)   11/02/2012  Patient/family provided with [facility] Department of Clinical Social Works list of facilities offering this level of care within the geographic area requested by the patient (or if unable, by the patients family).  11/02/2012  Patient/family informed of their freedom to choose among providers that offer the needed level of care, that participate in Medicare, Medicaid or managed care program needed by the patient, have an available bed and are willing to accept the patient.  11/02/2012  Patient/family informed of [facility] ownership interest in [facility], as well as of the fact that they are under no obligation to receive care at this facility.  PASARR submitted to EDS on  PASARR number received from EDS on   FL2 transmitted to all facilities in geographic area requested by pt/family on  11/02/2012 FL2 transmitted to all facilities within larger geographic area on   Patient informed that his/her managed care company has contracts with or will negotiate with  certain facilities, including the following:     Patient/family informed of bed offers received:   Patient chooses bed at  Physician recommends and patient chooses bed at    Patient to be transferred to  on   Patient to be transferred to facility by   The following physician request were entered in Epic:   Additional Comments: MD is requesting SNF placement for IV antibiotics and dressing changes. Patient may not be eligible for medicaid so placement, may be difficult.  CSW is working with supervisor regarding barriers to disposition of SNF placement.

## 2012-11-02 NOTE — Progress Notes (Signed)
Pt refused multiple times that she did not want to wear he CPAP tonight. She said she did not enjoy it the previous night, that it was too tight, itchy, and very uncomfortable. Pt vitals were stable HR 106. RR 18. SPO2 97% on room air. Will continue to monitor.

## 2012-11-02 NOTE — ED Notes (Addendum)
Patient at baseline - resting with stable vital signs - back on RA with O2 sats 98% - does not remember much about dressing change.  Airway intact.  Care returned to [facility].   Rapid response RN called to assist with sedation for dressing change at bedside.  Will Pellston PA present - patient assessed - proceeded with light to moderate sedation per orders.  Patient monitored per protocol.  See sedation narrator for details. Patient tolerated procedure.

## 2012-11-02 NOTE — Progress Notes (Signed)
2 Days Post-Op  Subjective:  Dressing change with conscious sedation.  Rapid response nurse assisted. We used Versed and morphine.   She will need the same treatment with next dressing change.    Objective: Vital signs in last 24 hours: Temp:  [97.7 F (36.5 C)-99.7 F (37.6 C)] 98.1 F (36.7 C) (11/22 0643) Pulse Rate:  [88-110] 88  (11/22 0643) Resp:  [17-18] 18  (11/22 0643) BP: (118-144)/(68-77) 132/75 mmHg (11/22 0643) SpO2:  [98 %-100 %] 100 % (11/22 0643) Last BM Date: 10/31/12  Diet: Carb modified. Afebrile, VSS, WBC is down to normal.  Intake/Output from previous day: 11/21 0701 - 11/22 0700 In: 2890 [P.O.:720; I.V.:1470; IV Piggyback:700] Out: 1000 [Urine:1000] Intake/Output this shift:    Incision/Wound:  We took 1 bottle of iodoform from the left side and 2 from the right.  It was repacked with 1/2 inch iodoform on both sides.  Digital exam used to insure all dressing was removed.  We then repacked both sides. She did well but it was very painful and stressful for her.  Lab Results:   [facility] 11/02/12 0611 11/01/12 0620  WBC 9.7 11.0*  HGB 8.9* 9.2*  HCT 26.9* 27.3*  PLT 326 311    BMET  Basename 11/01/12 1413 11/01/12 0620  NA 132* 135  K 4.1 4.4  CL 101 104  CO2 22 18*  GLUCOSE 233* 231*  BUN 6 5*  CREATININE 0.96 1.04  CALCIUM 8.1* 8.3*   PT/INR No results found for this basename: LABPROT:2,INR:2 in the last 72 hours   Lab 11/01/12 1413  AST 14  ALT <5  ALKPHOS 56  BILITOT 0.2*  PROT 5.8*  ALBUMIN 1.6*     Lipase     Component Value Date/Time   LIPASE 46 10/24/2012 1137     Studies/Results: No results found.  Medications:    . antiseptic oral rinse  15 mL Mouth Rinse QID  . chlorhexidine  15 mL Mouth/Throat BID  . enoxaparin  40 mg Subcutaneous Q24H  . fentaNYL  200 mcg Intravenous Once  . insulin aspart  0-20 Units Subcutaneous TID WC  . insulin aspart protamine-insulin aspart  24 Units Subcutaneous BID WC  . labetalol   100 mg Oral BID  . lisinopril  10 mg Oral Daily  . midazolam      . morphine      . pantoprazole  40 mg Oral Daily  . piperacillin-tazobactam (ZOSYN)  IV  3.375 g Intravenous Q8H  . potassium chloride  40 mEq Oral BID  . sodium bicarbonate  650 mg Oral BID  . vancomycin  1,250 mg Intravenous Q12H    Assessment/Plan 1.posterior/medial thigh abscess, s/p I&d OF ABSCESS BOTH Thighs.  with dressing changes daily now. Cultures are pending, gram stain shows GM + cocci. 2. History of hydradenitis with similar process on the right that has drained.  3. AODM with poor control/DKA  4. Shortness of breath with respiratory failure secondary to DKA with altered mental status  5. Hypertension  6. Weight loss 60-70 pounds/BMI 34.5      LOS: 9 days    Rebecca Rhodes 11/02/2012

## 2012-11-03 LAB — GLUCOSE, CAPILLARY
Glucose-Capillary: 148 mg/dL — ABNORMAL HIGH (ref 70–99)
Glucose-Capillary: 132 mg/dL — ABNORMAL HIGH (ref 70–99)
Glucose-Capillary: 78 mg/dL (ref 70–99)

## 2012-11-03 LAB — CULTURE, ROUTINE-ABSCESS

## 2012-11-03 MED ORDER — LIDOCAINE HCL (PF) 1 % IJ SOLN
INTRAMUSCULAR | Status: AC
  Administered 2012-11-03: 10:00:00
  Filled 2012-11-03: qty 15

## 2012-11-03 MED ORDER — LIDOCAINE HCL (PF) 1 % IJ SOLN
INTRAMUSCULAR | Status: AC
  Administered 2012-11-03: 10:00:00
  Filled 2012-11-03: qty 10

## 2012-11-03 MED ORDER — SODIUM CHLORIDE 0.9 % IV SOLN
500.0000 mg | Freq: Four times a day (QID) | INTRAVENOUS | Status: DC
  Administered 2012-11-03 – 2012-11-04 (×4): 500 mg via INTRAVENOUS
  Filled 2012-11-03 (×6): qty 500

## 2012-11-03 MED ORDER — MIDAZOLAM HCL 2 MG/2ML IJ SOLN
INTRAMUSCULAR | Status: AC
  Administered 2012-11-03: 2 mg via INTRAMUSCULAR
  Filled 2012-11-03: qty 4

## 2012-11-03 MED ORDER — CLINDAMYCIN PHOSPHATE 300 MG/50ML IV SOLN
300.0000 mg | Freq: Four times a day (QID) | INTRAVENOUS | Status: DC
  Filled 2012-11-03 (×2): qty 50

## 2012-11-03 MED ORDER — CLINDAMYCIN PHOSPHATE 300 MG/50ML IV SOLN
300.0000 mg | Freq: Four times a day (QID) | INTRAVENOUS | Status: DC
  Administered 2012-11-03 – 2012-11-05 (×9): 300 mg via INTRAVENOUS
  Filled 2012-11-03 (×11): qty 50

## 2012-11-03 MED ORDER — DIPHENHYDRAMINE HCL 25 MG PO CAPS
25.0000 mg | ORAL_CAPSULE | Freq: Once | ORAL | Status: AC
  Administered 2012-11-03: 25 mg via ORAL
  Filled 2012-11-03: qty 1

## 2012-11-03 NOTE — Progress Notes (Signed)
Called to help assist MD with dressing change.  Patient placed on monitor with blood pressure and oxygen saturation.  Patient placed on nasal cannula at 3 lpm for procedure.  Patient given a total of 50 mcg IV fentanyl and 2 mg IV versed during procedure as per MD.  VSS throughout.  Patient complained of pain post procedure 7/10, 50 MCG IV fentanyl given, Primary RN aware.  RN to call if assistance needed

## 2012-11-03 NOTE — Progress Notes (Addendum)
General Surgery Note  LOS: 10 days  POD -  3 Days Post-Op  Assessment/Plan: 1.  IRRIGATION AND DEBRIDEMENT ABSCESS of bilateral upper inner thigh abscesses - J. Wyatt - 10/31/2102  On Vanc and Zosyn.  On isolation because of open wound, not because of specific bacteria.  The wounds are clean, but have moderate "tunneling", which makes packing harder.  I plan to open the wounds more at the bedside.  Agree with SNF for assistance with wound management.  I would think that she would need antibiotics for a total of 7 to 10 days from the date of I&D.  I would expect she would be ready for d/c Monday or Tuesday. [I&D at bedside - see brief op note]   2.  HTN (hypertension)   3.  Diabetes mellitus  (history of DKA)  Glucose - 233 - 11/03/2012 4.  Morbid obesity  Subjective:  Sore, but doing okay.  Works as Conservation officer, nature at [employer].  Lives with boyfriend. Objective:   Filed Vitals:   11/03/12 0541  BP: 131/79  Pulse: 87  Temp: 97.9 F (36.6 C)  Resp: 16     Intake/Output from previous day:  11/22 0701 - 11/23 0700 In: 2721.3 [P.O.:360; I.V.:1661.3; IV Piggyback:700] Out: -   Intake/Output this shift:      Physical Exam:   General: WN moderately obese AA F who is alert and oriented.    HEENT: Normal. Pupils equal. .   Wound: Approx 4 cm wound of right inner thigh and 6 cm wound on left inner thigh.  Both wounds tunnel some which makes packing them more difficult.  But the wounds are clean with minimal surrounding cellulitis.   Lab Results:    [facility] 11/02/12 0611 11/01/12 0620  WBC 9.7 11.0*  HGB 8.9* 9.2*  HCT 26.9* 27.3*  PLT 326 311    BMET   Basename 11/01/12 1413 11/01/12 0620  NA 132* 135  K 4.1 4.4  CL 101 104  CO2 22 18*  GLUCOSE 233* 231*  BUN 6 5*  CREATININE 0.96 1.04  CALCIUM 8.1* 8.3*    PT/INR  No results found for this basename: LABPROT:2,INR:2 in the last 72 hours  ABG  No results found for this basename: PHART:2,PCO2:2,PO2:2,HCO3:2 in the last 72  hours   Studies/Results:  No results found.   Anti-infectives:   Anti-infectives     Start     Dose/Rate Route Frequency Ordered Stop   10/28/12 1400   vancomycin (VANCOCIN) 1,250 mg in sodium chloride 0.9 % 250 mL IVPB        1,250 mg 166.7 mL/hr over 90 Minutes Intravenous Every 12 hours 10/28/12 1137     10/28/12 1400  piperacillin-tazobactam (ZOSYN) IVPB 3.375 g       3.375 g 12.5 mL/hr over 240 Minutes Intravenous 3 times per day 10/28/12 1137     10/26/12 1400   fluconazole (DIFLUCAN) IVPB 200 mg        200 mg 100 mL/hr over 60 Minutes Intravenous Every 24 hours 10/25/12 1555 10/27/12 1345   10/25/12 1300   fluconazole (DIFLUCAN) IVPB 150 mg  Status:  Discontinued        150 mg 75 mL/hr over 60 Minutes Intravenous Every 24 hours 10/25/12 1146 10/25/12 1243   10/25/12 1300   sodium chloride 0.9 % 0.01 mL with fluconazole (DIFLUCAN) 150 mg infusion  Status:  Discontinued        75 mL/hr  Intravenous Every 24 hours 10/25/12 1240  10/25/12 1554   10/24/12 1230   piperacillin-tazobactam (ZOSYN) IVPB 3.375 g  Status:  Discontinued        3.375 g 12.5 mL/hr over 240 Minutes Intravenous 3 times per day 10/24/12 1157 10/26/12 1112   10/24/12 1200   vancomycin (VANCOCIN) 1,250 mg in sodium chloride 0.9 % 250 mL IVPB  Status:  Discontinued        1,250 mg 166.7 mL/hr over 90 Minutes Intravenous Every 12 hours 10/24/12 1157 10/26/12 1112          Ovidio Kin, MD, FACS Pager: [phone],   [facility] Office: [phone] 11/03/2012

## 2012-11-03 NOTE — Progress Notes (Signed)
ANTIBIOTIC CONSULT NOTE - Follow-up  Pharmacy Consult for Primaxin Indication: Cellulitis  No Known Allergies  Patient Measurements: Height: 5' 4.17" (163 cm) (transcribed from 6/13) Weight: 201 lb 15.1 oz (91.6 kg) IBW/kg (Calculated) : 55.1   Vital Signs: Temp: 97.9 F (36.6 C) (11/23 0541) Temp src: Oral (11/23 0541) BP: 124/81 mmHg (11/23 1200) Pulse Rate: 93  (11/23 1200) Intake/Output from previous day: 11/22 0701 - 11/23 0700 In: 2721.3 [P.O.:360; I.V.:1661.3; IV Piggyback:700] Out: -  Intake/Output from this shift:    Labs:  Basename 11/02/12 0611 11/01/12 1413 11/01/12 0620  WBC 9.7 -- 11.0*  HGB 8.9* -- 9.2*  PLT 326 -- 311  LABCREA -- -- --  CREATININE -- 0.96 1.04   Estimated Creatinine Clearance: 92.6 ml/min (by C-G formula based on Cr of 0.96). No results found for this basename: VANCOTROUGH:2,VANCOPEAK:2,VANCORANDOM:2,GENTTROUGH:2,GENTPEAK:2,GENTRANDOM:2,TOBRATROUGH:2,TOBRAPEAK:2,TOBRARND:2,AMIKACINPEAK:2,AMIKACINTROU:2,AMIKACIN:2, in the last 72 hours   Microbiology: Recent Results (from the past 720 hour(s))  URINE CULTURE     Status: Normal   Collection Time   10/24/12  8:47 AM      Component Value Range Status Comment   Specimen Description URINE, RANDOM   Final    Special Requests ADDED AT 0847 ON 0852   Final    Culture  Setup Time 10/24/2012 17:22   Final    Colony Count NO GROWTH   Final    Culture NO GROWTH   Final    Report Status 10/25/2012 FINAL   Final   CULTURE, BLOOD (ROUTINE X 2)     Status: Normal   Collection Time   10/24/12 11:18 AM      Component Value Range Status Comment   Specimen Description BLOOD ARM RIGHT   Final    Special Requests BOTTLES DRAWN AEROBIC AND ANAEROBIC 10CC   Final    Culture  Setup Time 10/24/2012 18:55   Final    Culture NO GROWTH 5 DAYS   Final    Report Status 10/30/2012 FINAL   Final   CULTURE, BLOOD (ROUTINE X 2)     Status: Normal   Collection Time   10/24/12 11:25 AM      Component Value  Range Status Comment   Specimen Description BLOOD HAND RIGHT   Final    Special Requests BOTTLES DRAWN AEROBIC AND ANAEROBIC 10CC   Final    Culture  Setup Time 10/24/2012 18:55   Final    Culture NO GROWTH 5 DAYS   Final    Report Status 10/30/2012 FINAL   Final   MRSA PCR SCREENING     Status: Normal   Collection Time   10/24/12 11:52 AM      Component Value Range Status Comment   MRSA by PCR NEGATIVE  NEGATIVE Final   CULTURE, RESPIRATORY     Status: Normal   Collection Time   10/25/12 10:04 AM      Component Value Range Status Comment   Specimen Description TRACHEAL ASPIRATE   Final    Special Requests NONE   Final    Gram Stain     Final    Value: ABUNDANT WBC PRESENT, PREDOMINANTLY PMN     NO SQUAMOUS EPITHELIAL CELLS SEEN     RARE GRAM POSITIVE COCCI IN PAIRS   Culture Non-Pathogenic Oropharyngeal-type Flora Isolated.   Final    Report Status 10/27/2012 FINAL   Final   CULTURE, ROUTINE-ABSCESS     Status: Normal   Collection Time   10/31/12 12:56 PM  Component Value Range Status Comment   Specimen Description ABSCESS THIGH RIGHT   Final    Special Requests NONE   Final    Gram Stain     Final    Value: FEW WBC PRESENT,BOTH PMN AND MONONUCLEAR     NO SQUAMOUS EPITHELIAL CELLS SEEN     RARE GRAM POSITIVE COCCI   Culture NO GROWTH 3 DAYS   Final    Report Status 11/03/2012 FINAL   Final   ANAEROBIC CULTURE     Status: Normal (Preliminary result)   Collection Time   10/31/12 12:56 PM      Component Value Range Status Comment   Specimen Description ABSCESS THIGH RIGHT   Final    Special Requests NONE   Final    Gram Stain PENDING   Incomplete    Culture     Final    Value: NO ANAEROBES ISOLATED; CULTURE IN PROGRESS FOR 5 DAYS   Report Status PENDING   Incomplete   AFB CULTURE WITH SMEAR     Status: Normal (Preliminary result)   Collection Time   10/31/12 12:56 PM      Component Value Range Status Comment   Specimen Description ABSCESS RIGHT THIGH   Final     Special Requests NONE   Final    ACID FAST SMEAR NO ACID FAST BACILLI SEEN   Final    Culture     Final    Value: CULTURE WILL BE EXAMINED FOR 6 WEEKS BEFORE ISSUING A FINAL REPORT   Report Status PENDING   Incomplete   CULTURE, ROUTINE-ABSCESS     Status: Normal   Collection Time   10/31/12 12:56 PM      Component Value Range Status Comment   Specimen Description ABSCESS LEFT THIGH   Final    Special Requests NONE   Final    Gram Stain     Final    Value: FEW WBC PRESENT,BOTH PMN AND MONONUCLEAR     RARE SQUAMOUS EPITHELIAL CELLS PRESENT     NO ORGANISMS SEEN   Culture NO GROWTH 3 DAYS   Final    Report Status 11/03/2012 FINAL   Final   ANAEROBIC CULTURE     Status: Normal (Preliminary result)   Collection Time   10/31/12 12:56 PM      Component Value Range Status Comment   Specimen Description ABSCESS LEFT THIGH   Final    Special Requests NONE   Final    Gram Stain PENDING   Incomplete    Culture     Final    Value: NO ANAEROBES ISOLATED; CULTURE IN PROGRESS FOR 5 DAYS   Report Status PENDING   Incomplete   AFB CULTURE WITH SMEAR     Status: Normal (Preliminary result)   Collection Time   10/31/12 12:56 PM      Component Value Range Status Comment   Specimen Description ABSCESS LEFT THIGH   Final    Special Requests NONE   Final    ACID FAST SMEAR NO ACID FAST BACILLI SEEN   Final    Culture     Final    Value: CULTURE WILL BE EXAMINED FOR 6 WEEKS BEFORE ISSUING A FINAL REPORT   Report Status PENDING   Incomplete    Assessment: 31 y.o. F previously on  Vancomycin + Zosyn for empiric cellulitis coverage. Antibiotics changed to primaxin today. Pt is afebrile and WBC is 9.7.  Goal of Therapy:  Eradication of infection  Plan:  1. Primaxin 500 mg IV q6 hours. 2.  F/u renal function, clinical course and cultures.  Talbert Cage, PharmD Pager # [phone] 11/03/2012 12:27 PM

## 2012-11-03 NOTE — Op Note (Signed)
10/24/2012 - 10/31/2012  12:01 PM  PATIENT:  Rebecca Rhodes, 31 y.o., female, MRN: [id number]  PREOP DIAGNOSIS: Bilateral medial thigh abscess  POSTOP DIAGNOSIS:   Bilateral medial thigh abscess  PROCEDURE:   Procedure(s): IRRIGATION AND DEBRIDEMENT of bilateral medial thigh ABSCESS  SURGEON:   Ovidio Kin, M.D.  ANESTHESIA:   IV sedation  Supervised by rapid response team  EBL:  < 50  ml  LOCAL MEDICATIONS USED:   15 cc 1% xylocaine  INDICATIONS FOR PROCEDURE:  Rebecca Rhodes is a 31 y.o. (DOB: 12/04/1981) AA female whose primary care physician is [facility], [place].  She is in room 6N24.  I am doing a wider incision at the bedside.   The indications and risks of the surgery were explained to the patient.  The risks include, but are not limited to, infection, bleeding, and nerve injury.  Procedure:  Bilateral medial thigh wounds cleaned with betadine and infiltrated with approx. 15 cc of 1% xylocaine.  I opened the right thigh wound from about 4 cm to 12 cm.  I opened the left thigh wound from about 6 cm to 10 cm.   The wounds were packed with saline 4 inch Curlex gauze and sterilely dressed.  She tolerated the procedure well.

## 2012-11-03 NOTE — Progress Notes (Signed)
[facility] PROGRESS NOTE  Rebecca Rhodes JXB:[id number] DOB: 1981-08-06 DOA: 10/24/2012 PCP: Georganna Skeans, MD  Assessment/Plan: Principal Problem:  *DKA (diabetic ketoacidoses) Active Problems:  Leukocytosis  HTN (hypertension)  Diabetes mellitus  Abscess of buttock, left  Acute respiratory failure    Acute respiratory failure status post extubation, respiratory status is stable , noncompliant with CPAP at night  Hypertension continue ACE inhibitor, when necessary labetalol   Acute renal insufficiency creatinine went up to 1.49, now 0.99 with IV hydration   Diabetic ketoacidosis patient had a recurrence of her DKA with an anion gap had gone up to 22 due to presence of cellulitis after being transferred from the [facility] in service, now improved. Anion gap significantly improve, 11 this morning. Continue NPH, sliding scale insulin, increase dose to 30 units twice a day. Uncontrolled CBGs due to infection , now improving  Hemoglobin A1c of greater than 20    Anemia hemoglobin has come down from 16.0 to 8.9, postoperative/ dilutional, no obvious bleeding, monitor CBC  Leukocytosis initially thought to have a boil, was on vancomycin and Zosyn , discontinued by PCCM, which had to be resumed because of cellulitis/abscess, changed abx to clinda/ imipenem due to drug rash,  CT scan confirmed abscess which was drained on 1/20 Drainage of posterior/medial thigh abscess,15 x 12 cm in size. Await for culture data to come back before tapering antibiotics . Requiring Dressing changes by Sherrie George, PA under conscious sedation , they are quite painful, will need to go to a nursing home for dressing changes for at least 7-10 days. He'll also need IV antibiotics for another 7-10 days. 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 She was found to be in DKA with a AG of 20.1. The patient was placed on CPAP in the ER, given 2L of NS and an insulin drip was ordered. PCCM was called for admission and further management  She was transferred out by the pCCM on 11/16 She went back into DKA, and had to be restarted on insulin drip CT scan was done to investigate her cellulitis/abscess Abscess was drained on 11/20 Dressing changes are painful and the patient will need to go to SNF for IV antibiotics and dressing changes   LINES / TUBES:  Left CVC IJ 11/13 >>>  Foley 11/13 >>>  oett 11/13>>> 11/15  CULTURES:  Blood x2 11/13 >>>  Urine 11/13 >>> neg  Sputum 11/13 >>>  ANTIBIOTICS:  Zosyn 11/13 >>> 11/15  Vanc 11/13 >>> 11/15  SIGNIFICANT EVENTS:  11/13: Intubated/1114- improved ph, neurostatus  11/4 Extubated, gap closed     HPI/Subjective:  No complaints   Objective: Filed Vitals:   11/02/12 1207 11/02/12 2146 11/03/12 0202 11/03/12 0541  BP: 135/88 131/81 136/79 131/79  Pulse: 95 104 100 87  Temp:  99.7 F (37.6 C) 98.9 F (37.2 C) 97.9 F (36.6 C)  TempSrc:  Oral Oral Oral  Resp: 18 18 18 16   Height:      Weight:  SpO2: 98% 98% 98% 99%    Intake/Output Summary (Last 24 hours) at 11/03/12 0841 Last data filed at 11/03/12 0509  Gross per 24 hour  Intake 2721.25 ml  Output      0 ml  Net 2721.25 ml    Exam:  HENT:  Head: Atraumatic.  Nose: Nose normal.  Mouth/Throat: Oropharynx is clear and moist.  Eyes: Conjunctivae are normal. Pupils are equal, round, and reactive to light. No scleral icterus.  Neck:  Neck supple. No tracheal deviation present.  Cardiovascular: Normal rate, regular rhythm, normal heart sounds and intact distal pulses.  Pulmonary/Chest: Effort normal and breath sounds normal. No respiratory distress.  Abdominal: Soft. Normal appearance and bowel sounds are normal. She exhibits no distension. There is no tenderness.  Musculoskeletal: She exhibits no edema and no tenderness.  Neurological: She is alert. No cranial nerve deficit.    Data Reviewed: Basic Metabolic Panel:  Lab 11/01/12 4098 11/01/12 0620 10/30/12 0637 10/28/12 2030 10/28/12 0855  NA 132* 135 134* 134* 139  K 4.1 4.4 4.3 3.7 3.3*  CL 101 104 107 107 110  CO2 22 18* 14* 16* 19  GLUCOSE 233* 231* 280* 301* 158*  BUN 6 5* 10 11 10   CREATININE 0.96 1.04 0.99 1.01 0.87  CALCIUM 8.1* 8.3* 8.3* 8.1* 8.3*  MG -- -- -- -- 1.8  PHOS -- -- -- -- --    Liver Function Tests:  Lab 11/01/12 1413  AST 14  ALT <5  ALKPHOS 56  BILITOT 0.2*  PROT 5.8*  ALBUMIN 1.6*   No results found for this basename: LIPASE:5,AMYLASE:5 in the last 168 hours No results found for this basename: AMMONIA:5 in the last 168 hours  CBC:  Lab 11/02/12 0611 11/01/12 0620 10/31/12 0615 10/30/12 0637 10/28/12 0242  WBC 9.7 11.0* 13.8* 14.3* 13.6*  NEUTROABS -- -- -- -- --  HGB 8.9* 9.2* 9.4* 9.4* 9.7*  HCT 26.9* 27.3* 28.1* 28.6* 28.8*  MCV 88.5 88.6 87.5 87.2 86.0  PLT 326 311 278 253 199    Cardiac Enzymes: No results found for this basename: CKTOTAL:5,CKMB:5,CKMBINDEX:5,TROPONINI:5 in the last 168 hours BNP (last 3 results) No results found for this basename: PROBNP:3 in the last 8760 hours   CBG:  Lab 11/03/12 0806 11/02/12 2144 11/02/12 1850 11/02/12 1223 11/02/12 0809  GLUCAP 132* 148* 232* 143* 126*    Recent Results (from the past 240 hour(s))  URINE CULTURE     Status: Normal   Collection Time   10/24/12  8:47 AM      Component Value Range Status Comment   Specimen Description URINE, RANDOM   Final     Special Requests ADDED AT 0847 ON 0852   Final    Culture  Setup Time 10/24/2012 17:22   Final    Colony Count NO GROWTH   Final    Culture NO GROWTH   Final    Report Status 10/25/2012 FINAL   Final   CULTURE, BLOOD (ROUTINE X 2)     Status: Normal   Collection Time   10/24/12 11:18 AM      Component Value Range Status Comment   Specimen Description BLOOD ARM RIGHT   Final    Special Requests BOTTLES DRAWN AEROBIC AND ANAEROBIC 10CC   Final    Culture  Setup Time 10/24/2012 18:55   Final    Culture NO GROWTH 5 DAYS   Final    Report Status 10/30/2012 FINAL   Final   CULTURE, BLOOD (  ROUTINE X 2)     Status: Normal   Collection Time   10/24/12 11:25 AM      Component Value Range Status Comment   Specimen Description BLOOD HAND RIGHT   Final    Special Requests BOTTLES DRAWN AEROBIC AND ANAEROBIC 10CC   Final    Culture  Setup Time 10/24/2012 18:55   Final    Culture NO GROWTH 5 DAYS   Final    Report Status 10/30/2012 FINAL   Final   MRSA PCR SCREENING     Status: Normal   Collection Time   10/24/12 11:52 AM      Component Value Range Status Comment   MRSA by PCR NEGATIVE  NEGATIVE Final   CULTURE, RESPIRATORY     Status: Normal   Collection Time   10/25/12 10:04 AM      Component Value Range Status Comment   Specimen Description TRACHEAL ASPIRATE   Final    Special Requests NONE   Final    Gram Stain     Final    Value: ABUNDANT WBC PRESENT, PREDOMINANTLY PMN     NO SQUAMOUS EPITHELIAL CELLS SEEN     RARE GRAM POSITIVE COCCI IN PAIRS   Culture Non-Pathogenic Oropharyngeal-type Flora Isolated.   Final    Report Status 10/27/2012 FINAL   Final   CULTURE, ROUTINE-ABSCESS     Status: Normal   Collection Time   10/31/12 12:56 PM      Component Value Range Status Comment   Specimen Description ABSCESS THIGH RIGHT   Final    Special Requests NONE   Final    Gram Stain     Final    Value: FEW WBC PRESENT,BOTH PMN AND MONONUCLEAR     NO SQUAMOUS EPITHELIAL CELLS SEEN     RARE  GRAM POSITIVE COCCI   Culture NO GROWTH 3 DAYS   Final    Report Status 11/03/2012 FINAL   Final   ANAEROBIC CULTURE     Status: Normal (Preliminary result)   Collection Time   10/31/12 12:56 PM      Component Value Range Status Comment   Specimen Description ABSCESS THIGH RIGHT   Final    Special Requests NONE   Final    Gram Stain PENDING   Incomplete    Culture     Final    Value: NO ANAEROBES ISOLATED; CULTURE IN PROGRESS FOR 5 DAYS   Report Status PENDING   Incomplete   AFB CULTURE WITH SMEAR     Status: Normal (Preliminary result)   Collection Time   10/31/12 12:56 PM      Component Value Range Status Comment   Specimen Description ABSCESS RIGHT THIGH   Final    Special Requests NONE   Final    ACID FAST SMEAR NO ACID FAST BACILLI SEEN   Final    Culture     Final    Value: CULTURE WILL BE EXAMINED FOR 6 WEEKS BEFORE ISSUING A FINAL REPORT   Report Status PENDING   Incomplete   CULTURE, ROUTINE-ABSCESS     Status: Normal   Collection Time   10/31/12 12:56 PM      Component Value Range Status Comment   Specimen Description ABSCESS LEFT THIGH   Final    Special Requests NONE   Final    Gram Stain     Final    Value: FEW WBC PRESENT,BOTH PMN AND MONONUCLEAR     RARE SQUAMOUS EPITHELIAL CELLS PRESENT  NO ORGANISMS SEEN   Culture NO GROWTH 3 DAYS   Final    Report Status 11/03/2012 FINAL   Final   ANAEROBIC CULTURE     Status: Normal (Preliminary result)   Collection Time   10/31/12 12:56 PM      Component Value Range Status Comment   Specimen Description ABSCESS LEFT THIGH   Final    Special Requests NONE   Final    Gram Stain PENDING   Incomplete    Culture     Final    Value: NO ANAEROBES ISOLATED; CULTURE IN PROGRESS FOR 5 DAYS   Report Status PENDING   Incomplete   AFB CULTURE WITH SMEAR     Status: Normal (Preliminary result)   Collection Time   10/31/12 12:56 PM      Component Value Range Status Comment   Specimen Description ABSCESS LEFT THIGH   Final     Special Requests NONE   Final    ACID FAST SMEAR NO ACID FAST BACILLI SEEN   Final    Culture     Final    Value: CULTURE WILL BE EXAMINED FOR 6 WEEKS BEFORE ISSUING A FINAL REPORT   Report Status PENDING   Incomplete      Studies: Ct Femur Left W Contrast  10/29/2012  *RADIOLOGY REPORT*  Clinical Data: Upper thigh pain.  Cellulitis.  Leukocytosis.  CT OF THE LEFT FEMUR WITH CONTRAST  Contrast: OMNIPAQUE IOHEXOL 300 MG/ML  SOLN  Comparison: None.  Findings: There are focal areas of cellulitis in the proximal medial aspects of both thighs, more prominent on the left than the right.  There is a poorly defined fluid collection in the soft tissues of the medial aspect of the left thigh measuring 8.4 x 4.2 x 3.2 cm.  This is not a well defined abscess.  There is prominent edema in the adjacent subcutaneous fat consistent with cellulitis.  There is a slightly more anterior area of subcutaneous edema, poorly defined in the proximal right thigh.  The adjacent muscle structures appear normal.  There is slight reactive adenopathy in the left inguinal region.  No acute osseous abnormality.  There is a small nonspecific left knee effusion.  IMPRESSION: Cellulitis in the proximal medial aspects of both thighs, more prominent on the left than the right.  There are poorly defined fluid collections in the subcutaneous fat without a well marginated abscess. I suspect some pus could be obtained from the proximal left thigh but the fluid collection is not well defined.   Original Report Authenticated By: Francene Boyers, M.D.    Dg Chest Portable 1 View  10/24/2012  *RADIOLOGY REPORT*  Clinical Data: ETT placement  PORTABLE CHEST - 1 VIEW  Comparison: 10/24/2012 at 0757 hours  Findings: Endotracheal tube terminates 5 cm above the carina.  Left IJ venous catheter terminates at/just above the cavoatrial junction.  Lungs are essentially clear.  No pleural effusion or pneumothorax.  The heart is normal in size.   IMPRESSION: Endotracheal tube terminates 5 cm above the carina.  Left IJ venous catheter terminates at/just above the cavoatrial junction.  No pneumothorax.   Original Report Authenticated By: Charline Bills, M.D.    Dg Chest Port 1 View  10/24/2012  *RADIOLOGY REPORT*  Clinical Data: Shortness of breath.  PORTABLE CHEST - 1 VIEW  Comparison: 06/05/2012  Findings: The heart size and pulmonary vascularity are normal and the lungs are clear.  No osseous abnormality.  IMPRESSION: Normal chest.  Original Report Authenticated By: Francene Boyers, M.D.     Scheduled Meds:   . antiseptic oral rinse  15 mL Mouth Rinse QID  . chlorhexidine  15 mL Mouth/Throat BID  . enoxaparin  40 mg Subcutaneous Q24H  . fentaNYL  200 mcg Intravenous Once  . insulin aspart  0-20 Units Subcutaneous TID WC  . insulin aspart protamine-insulin aspart  30 Units Subcutaneous BID WC  . labetalol  100 mg Oral BID  . lisinopril  10 mg Oral Daily  . [COMPLETED] midazolam      . [COMPLETED] midazolam      . [COMPLETED] morphine      . [COMPLETED] morphine      . pantoprazole  40 mg Oral Daily  . piperacillin-tazobactam (ZOSYN)  IV  3.375 g Intravenous Q8H  . potassium chloride  40 mEq Oral BID  . sodium bicarbonate  650 mg Oral BID  . vancomycin  1,250 mg Intravenous Q12H  . [DISCONTINUED] insulin aspart protamine-insulin aspart  24 Units Subcutaneous BID WC   Continuous Infusions:   . sodium chloride 75 mL/hr (11/02/12 0311)    Principal Problem:  *DKA (diabetic ketoacidoses) Active Problems:  Leukocytosis  HTN (hypertension)  Diabetes mellitus  Abscess of buttock, left  Acute respiratory failure    Time spent: 40 minutes   [facility]  [facility] Pager [phone]. If 8PM-8AM, please contact night-coverage at [email or web address], password [facility] 11/03/2012, 8:41 AM  LOS: 10 days

## 2012-11-04 ENCOUNTER — Inpatient Hospital Stay (HOSPITAL_COMMUNITY): Payer: MEDICAID

## 2012-11-04 LAB — GLUCOSE, CAPILLARY
Glucose-Capillary: 117 mg/dL — ABNORMAL HIGH (ref 70–99)
Glucose-Capillary: 102 mg/dL — ABNORMAL HIGH (ref 70–99)

## 2012-11-04 LAB — COMPREHENSIVE METABOLIC PANEL
ALT: 6 U/L (ref 0–35)
AST: 29 U/L (ref 0–37)
Alkaline Phosphatase: 53 U/L (ref 39–117)
CO2: 25 mEq/L (ref 19–32)
Chloride: 105 mEq/L (ref 96–112)
GFR calc Af Amer: 84 mL/min — ABNORMAL LOW (ref >90)
GFR calc non Af Amer: 73 mL/min — ABNORMAL LOW (ref >90)
Glucose, Bld: 99 mg/dL (ref 70–99)
Sodium: 139 mEq/L (ref 135–145)
Total Bilirubin: 0.2 mg/dL — ABNORMAL LOW (ref 0.3–1.2)

## 2012-11-04 LAB — URINALYSIS, ROUTINE W REFLEX MICROSCOPIC
Leukocytes, UA: NEGATIVE
Nitrite: NEGATIVE
Protein, ur: NEGATIVE mg/dL
Specific Gravity, Urine: 1.008 (ref 1.005–1.030)
Urobilinogen, UA: 1 mg/dL (ref 0.0–1.0)

## 2012-11-04 LAB — CBC
Hemoglobin: 8.6 g/dL — ABNORMAL LOW (ref 12.0–15.0)
MCH: 28.8 pg (ref 26.0–34.0)
MCV: 88.6 fL (ref 78.0–100.0)
Platelets: 366 10*3/uL (ref 150–400)
RBC: 2.99 MIL/uL — ABNORMAL LOW (ref 3.87–5.11)
WBC: 12.9 10*3/uL — ABNORMAL HIGH (ref 4.0–10.5)

## 2012-11-04 LAB — URINE MICROSCOPIC-ADD ON

## 2012-11-04 MED ORDER — HYDROMORPHONE HCL PF 1 MG/ML IJ SOLN
0.5000 mg | INTRAMUSCULAR | Status: DC | PRN
  Administered 2012-11-04: 1 mg via INTRAVENOUS
  Administered 2012-11-04: 2 mg via INTRAVENOUS
  Administered 2012-11-05: 1 mg via INTRAVENOUS
  Filled 2012-11-04: qty 1
  Filled 2012-11-04: qty 2

## 2012-11-04 MED ORDER — HYDROMORPHONE HCL PF 1 MG/ML IJ SOLN: 0.5000 mg | INTRAMUSCULAR | Status: DC | PRN

## 2012-11-04 NOTE — Progress Notes (Signed)
[facility]                                                                                Patient Demographics  Rebecca Rhodes, is a 31 y.o. female  ZOX:[id number]  [id number]  DOB - 1981/02/04  Admit date - 10/24/2012  Admitting Physician Alyson Reedy, MD  Outpatient Primary MD for the patient is Georganna Skeans, MD  LOS - 11   Chief Complaint  Patient presents with  . Shortness of Breath        Assessment & Plan   Brief narrative:   Rebecca Rhodes is a 31 yo female who presented to [facility] via EMS on 11/13 with a chief complaint of shortness of breath. She was found to be in DKA with a AG of 20.1 and had Met encephalopathy. The patient was placed on CPAP in the ER, given 2L of NS and an insulin drip was ordered. PCCM was called for admission and further management , once her gap closed and mentation improved she was extubated and transferred out from ICU. She was transferred out by the pCCM on 11/16.  She went back into DKA, and had to be restarted on insulin drip, CT scan was done to investigate her cellulitis/abscess , Abscess was drained on 11/20 by [facility], Dressing changes are painful and the patient will need to go to SNF for IV antibiotics and dressing changes.     1. Acute respiratory failure status post extubation, respiratory status is stable , ? CPAP at night (says does not have one at home) - had Ac.Resp failure due to DKA and Met encephalopathy-clinically resolved.    2. Rt Groin Abscess - post I&D by [facility] on 11-20, D/W Dr Terie Purser [facility] and ID Dr Stasia Cavalier only, for few more days, + dressing changes at [facility].    3.DM-2 poor control at home, stable on present regimen of 70-30 and ISS  Lab Results  Component Value Date   HGBA1C >20.0* 10/29/2012    CBG (last 3)   Basename 11/04/12 1223 11/04/12 0755 11/03/12 2208  GLUCAP 102* 117* 90      4. Hypertension continue ACE inhibitor, when necessary labetalol - stable.    5.Mild  Tachycardia - 90s bedside, no chest pain or SOB, due to deconditioning, PT-OT, in chair in am.      6.Acute renal insufficiency creatinine went up to 1.49, now 0.99 with IV hydration , resolved. Stop IVF.     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        9.Mild leukocytosis - likely from recent Abscess, repeat UA, CXR, monitor temp curve.       Code Status: Full  Family Communication: with patient  Disposition Plan: SNF    Procedures   LINES / TUBES:  Left CVC IJ 11/13 >>>  Foley 11/13 >>>  oett 11/13>>> 11/15    CULTURES:  Blood x2 11/13 >>>  Urine 11/13 >>> neg  Sputum 11/13 >>>  SIGNIFICANT EVENTS:  11/13: Intubated/1114- improved ph, neurostatus  11/4 Extubated, gap closed      Consults  PCCM, CCS, ID Dr Orvan Falconer on phone   Time Spent in minutes   35   Antibiotics    Anti-infectives     Start     Dose/Rate Route Frequency Ordered Stop   11/03/12 1430   clindamycin (CLEOCIN) IVPB 300 mg        300 mg 100 mL/hr over 30 Minutes Intravenous 4 times per day 11/03/12 1407     11/03/12 1200   clindamycin (CLEOCIN) IVPB 300 mg  Status:  Discontinued        300 mg 100 mL/hr over 30 Minutes Intravenous 4 times per day 11/03/12 1138 11/03/12 1407   11/03/12 1200   imipenem-cilastatin (PRIMAXIN) 500 mg in sodium chloride 0.9 % 100 mL IVPB  Status:  Discontinued        500 mg 200 mL/hr over 30 Minutes Intravenous 4 times per day 11/03/12 1148 11/04/12 0906   10/28/12 1400   vancomycin (VANCOCIN) 1,250 mg in sodium chloride 0.9 % 250 mL IVPB  Status:  Discontinued        1,250 mg 166.7 mL/hr over 90 Minutes Intravenous Every 12 hours 10/28/12 1137 11/03/12 1138   10/28/12 1400   piperacillin-tazobactam (ZOSYN) IVPB 3.375 g  Status:  Discontinued          3.375 g 12.5 mL/hr over 240 Minutes Intravenous 3 times per day 10/28/12 1137 11/03/12 1138   10/26/12 1400   fluconazole (DIFLUCAN) IVPB 200 mg        200 mg 100 mL/hr over 60 Minutes Intravenous Every 24 hours 10/25/12 1555 10/27/12 1345   10/25/12 1300   fluconazole (DIFLUCAN) IVPB 150 mg  Status:  Discontinued        150 mg 75 mL/hr over 60 Minutes Intravenous Every 24 hours 10/25/12 1146 10/25/12 1243   10/25/12 1300   sodium chloride 0.9 % 0.01 mL with fluconazole (DIFLUCAN) 150 mg infusion  Status:  Discontinued        75 mL/hr  Intravenous Every 24 hours 10/25/12 1240 10/25/12 1554   10/24/12 1230   piperacillin-tazobactam (ZOSYN) IVPB 3.375 g  Status:  Discontinued        3.375 g 12.5 mL/hr over 240 Minutes Intravenous 3 times per day 10/24/12 1157 10/26/12 1112   10/24/12 1200   vancomycin (VANCOCIN) 1,250 mg in sodium chloride 0.9 % 250 mL IVPB  Status:  Discontinued        1,250 mg 166.7 mL/hr over 90 Minutes Intravenous Every 12 hours 10/24/12 1157 10/26/12 1112          Scheduled Meds:   . antiseptic oral rinse  15 mL Mouth Rinse QID  . chlorhexidine  15 mL Mouth/Throat BID  . clindamycin (CLEOCIN) IV  300 mg Intravenous Q6H  . [COMPLETED] diphenhydrAMINE  25 mg Oral Once  . enoxaparin  40 mg Subcutaneous Q24H  . fentaNYL  200 mcg Intravenous Once  . insulin aspart  0-20 Units Subcutaneous TID WC  . insulin aspart protamine-insulin aspart  30 Units Subcutaneous BID WC  . labetalol  100 mg Oral BID  . lisinopril  10 mg Oral Daily  . pantoprazole  40 mg Oral Daily  . potassium chloride  40 mEq Oral BID  . sodium bicarbonate  650 mg Oral BID  . [DISCONTINUED] clindamycin (CLEOCIN) IV  300 mg Intravenous Q6H  . [DISCONTINUED] imipenem-cilastatin  500 mg Intravenous Q6H  Continuous Infusions:   . [DISCONTINUED] sodium chloride 75 mL/hr (11/02/12 0311)   PRN Meds:.dextrose, diphenhydrAMINE, fentaNYL, HYDROmorphone (DILAUDID) injection, labetalol,  ondansetron (ZOFRAN) IV, oxyCODONE-acetaminophen, sodium chloride, [DISCONTINUED]  HYDROmorphone (DILAUDID) injection, [DISCONTINUED]  HYDROmorphone (DILAUDID) injection   DVT Prophylaxis  Lovenox    Lab Results  Component Value Date   PLT 366 11/04/2012      Susa Raring K M.D on 11/04/2012 at 1:18 PM  Between 7am to 7pm - Pager - [phone]  After 7pm go to [email or web address] - password [email or web address]  And look for the night coverage person covering for me after hours  [facility] Office  [phone]    Subjective:   Rebecca Rhodes today has, No headache, No chest pain, No abdominal pain - No Nausea, No new weakness tingling or numbness, No Cough - SOB.    Objective:   Filed Vitals:   11/03/12 2331 11/04/12 0246 11/04/12 0622 11/04/12 0954  BP:  130/78 143/89 134/79  Pulse: 90 100 102 107  Temp:  99.3 F (37.4 C) 99.4 F (37.4 C) 99.1 F (37.3 C)  TempSrc:    Oral  Resp: 18 18 18 18   Height:      Weight:      SpO2: 98% 97% 98% 94%    Wt Readings from Last 3 Encounters:  10/26/12 91.6 kg (201 lb 15.1 oz)  10/26/12 91.6 kg (201 lb 15.1 oz)  06/04/12 100.8 kg (222 lb 3.6 oz)     Intake/Output Summary (Last 24 hours) at 11/04/12 1318 Last data filed at 11/04/12 0600  Gross per 24 hour  Intake 2213.75 ml  Output      0 ml  Net 2213.75 ml    Exam Awake Alert, Oriented X 3, No new F.N deficits, Normal affect [place].AT,PERRAL Supple Neck,No JVD, No cervical lymphadenopathy appriciated.  Symmetrical Chest wall movement, Good air movement bilaterally, CTAB RRR,No Gallops,Rubs or new Murmurs, No Parasternal Heave +ve B.Sounds, Abd Soft, Non tender, No organomegaly appriciated, No rebound - guarding or rigidity. No Cyanosis, Clubbing or edema, No new Rash or bruise, Rt groin area appears healing well.   Data Review   Micro Results Recent Results (from the past 240 hour(s))  CULTURE, ROUTINE-ABSCESS     Status: Normal   Collection Time   10/31/12 12:56 PM       Component Value Range Status Comment   Specimen Description ABSCESS THIGH RIGHT   Final    Special Requests NONE   Final    Gram Stain     Final    Value: FEW WBC PRESENT,BOTH PMN AND MONONUCLEAR     NO SQUAMOUS EPITHELIAL CELLS SEEN     RARE GRAM POSITIVE COCCI   Culture NO GROWTH 3 DAYS   Final    Report Status 11/03/2012 FINAL   Final   ANAEROBIC CULTURE     Status: Normal (Preliminary result)   Collection Time   10/31/12 12:56 PM      Component Value Range Status Comment   Specimen Description ABSCESS THIGH RIGHT   Final    Special Requests NONE   Final    Gram Stain PENDING   Incomplete    Culture     Final    Value: NO ANAEROBES ISOLATED; CULTURE IN PROGRESS FOR 5 DAYS   Report Status PENDING   Incomplete   AFB CULTURE WITH SMEAR     Status: Normal (Preliminary result)   Collection Time   10/31/12 12:56 PM      Component Value  Range Status Comment   Specimen Description ABSCESS RIGHT THIGH   Final    Special Requests NONE   Final    ACID FAST SMEAR NO ACID FAST BACILLI SEEN   Final    Culture     Final    Value: CULTURE WILL BE EXAMINED FOR 6 WEEKS BEFORE ISSUING A FINAL REPORT   Report Status PENDING   Incomplete   CULTURE, ROUTINE-ABSCESS     Status: Normal   Collection Time   10/31/12 12:56 PM      Component Value Range Status Comment   Specimen Description ABSCESS LEFT THIGH   Final    Special Requests NONE   Final    Gram Stain     Final    Value: FEW WBC PRESENT,BOTH PMN AND MONONUCLEAR     RARE SQUAMOUS EPITHELIAL CELLS PRESENT     NO ORGANISMS SEEN   Culture NO GROWTH 3 DAYS   Final    Report Status 11/03/2012 FINAL   Final   ANAEROBIC CULTURE     Status: Normal (Preliminary result)   Collection Time   10/31/12 12:56 PM      Component Value Range Status Comment   Specimen Description ABSCESS LEFT THIGH   Final    Special Requests NONE   Final    Gram Stain PENDING   Incomplete    Culture     Final    Value: NO ANAEROBES ISOLATED; CULTURE IN  PROGRESS FOR 5 DAYS   Report Status PENDING   Incomplete   AFB CULTURE WITH SMEAR     Status: Normal (Preliminary result)   Collection Time   10/31/12 12:56 PM      Component Value Range Status Comment   Specimen Description ABSCESS LEFT THIGH   Final    Special Requests NONE   Final    ACID FAST SMEAR NO ACID FAST BACILLI SEEN   Final    Culture     Final    Value: CULTURE WILL BE EXAMINED FOR 6 WEEKS BEFORE ISSUING A FINAL REPORT   Report Status PENDING   Incomplete     Radiology Reports Ct Femur Left W Contrast  10/29/2012  *RADIOLOGY REPORT*  Clinical Data: Upper thigh pain.  Cellulitis.  Leukocytosis.  CT OF THE LEFT FEMUR WITH CONTRAST  Contrast: OMNIPAQUE IOHEXOL 300 MG/ML  SOLN  Comparison: None.  Findings: There are focal areas of cellulitis in the proximal medial aspects of both thighs, more prominent on the left than the right.  There is a poorly defined fluid collection in the soft tissues of the medial aspect of the left thigh measuring 8.4 x 4.2 x 3.2 cm.  This is not a well defined abscess.  There is prominent edema in the adjacent subcutaneous fat consistent with cellulitis.  There is a slightly more anterior area of subcutaneous edema, poorly defined in the proximal right thigh.  The adjacent muscle structures appear normal.  There is slight reactive adenopathy in the left inguinal region.  No acute osseous abnormality.  There is a small nonspecific left knee effusion.  IMPRESSION: Cellulitis in the proximal medial aspects of both thighs, more prominent on the left than the right.  There are poorly defined fluid collections in the subcutaneous fat without a well marginated abscess. I suspect some pus could be obtained from the proximal left thigh but the fluid collection is not well defined.   Original Report Authenticated By: Francene Boyers, M.D.    Dg Chest Portable  1 View  10/24/2012  *RADIOLOGY REPORT*  Clinical Data: ETT placement  PORTABLE CHEST - 1 VIEW  Comparison:  10/24/2012 at 0757 hours  Findings: Endotracheal tube terminates 5 cm above the carina.  Left IJ venous catheter terminates at/just above the cavoatrial junction.  Lungs are essentially clear.  No pleural effusion or pneumothorax.  The heart is normal in size.  IMPRESSION: Endotracheal tube terminates 5 cm above the carina.  Left IJ venous catheter terminates at/just above the cavoatrial junction.  No pneumothorax.   Original Report Authenticated By: Charline Bills, M.D.    Dg Chest Port 1 View  10/24/2012  *RADIOLOGY REPORT*  Clinical Data: Shortness of breath.  PORTABLE CHEST - 1 VIEW  Comparison: 06/05/2012  Findings: The heart size and pulmonary vascularity are normal and the lungs are clear.  No osseous abnormality.  IMPRESSION: Normal chest.   Original Report Authenticated By: Francene Boyers, M.D.     CBC  Lab 11/04/12 0610 11/02/12 0611 11/01/12 0620 10/31/12 0615 10/30/12 0637  WBC 12.9* 9.7 11.0* 13.8* 14.3*  HGB 8.6* 8.9* 9.2* 9.4* 9.4*  HCT 26.5* 26.9* 27.3* 28.1* 28.6*  PLT 366 326 311 278 253  MCV 88.6 88.5 88.6 87.5 87.2  MCH 28.8 29.3 29.9 29.3 28.7  MCHC 32.5 33.1 33.7 33.5 32.9  RDW 15.0 15.0 15.2 15.0 15.0  LYMPHSABS -- -- -- -- --  MONOABS -- -- -- -- --  EOSABS -- -- -- -- --  BASOSABS -- -- -- -- --  BANDABS -- -- -- -- --    Chemistries   Lab 11/04/12 0610 11/01/12 1413 11/01/12 0620 10/30/12 0637 10/28/12 2030  NA 139 132* 135 134* 134*  K 3.6 4.1 4.4 4.3 3.7  CL 105 101 104 107 107  CO2 25 22 18* 14* 16*  GLUCOSE 99 233* 231* 280* 301*  BUN 5* 6 5* 10 11  CREATININE 1.01 0.96 1.04 0.99 1.01  CALCIUM 8.2* 8.1* 8.3* 8.3* 8.1*  MG -- -- -- -- --  AST 29 14 -- -- --  ALT 6 <5 -- -- --  ALKPHOS 53 56 -- -- --  BILITOT 0.2* 0.2* -- -- --   ------------------------------------------------------------------------------------------------------------------ estimated creatinine clearance is 88 ml/min (by C-G formula based on Cr of  1.01). ------------------------------------------------------------------------------------------------------------------ No results found for this basename: HGBA1C:2 in the last 72 hours ------------------------------------------------------------------------------------------------------------------ No results found for this basename: CHOL:2,HDL:2,LDLCALC:2,TRIG:2,CHOLHDL:2,LDLDIRECT:2 in the last 72 hours ------------------------------------------------------------------------------------------------------------------ No results found for this basename: TSH,T4TOTAL,FREET3,T3FREE,THYROIDAB in the last 72 hours ------------------------------------------------------------------------------------------------------------------ No results found for this basename: VITAMINB12:2,FOLATE:2,FERRITIN:2,TIBC:2,IRON:2,RETICCTPCT:2 in the last 72 hours  Coagulation profile No results found for this basename: INR:5,PROTIME:5 in the last 168 hours  No results found for this basename: DDIMER:2 in the last 72 hours  Cardiac Enzymes No results found for this basename: CK:3,CKMB:3,TROPONINI:3,MYOGLOBIN:3 in the last 168 hours ------------------------------------------------------------------------------------------------------------------ No components found with this basename: POCBNP:3

## 2012-11-04 NOTE — Progress Notes (Signed)
General Surgery Note  LOS: 11 days  POD -  4 Days Post-Op  Assessment/Plan: 1.  IRRIGATION AND DEBRIDEMENT ABSCESS of bilateral upper inner thigh abscesses - J. Wyatt - 10/31/2102  On Vanc and Zosyn -> changed to Primaxin 11/03/2012.  Note she is also on Cleocin.  (Her antibiotics are being managed by pharm)  WBC 12,900 - 11/04/2012  On isolation because of open wound, not because of specific bacteria.  Wounds opened more widely on 11/03/2012 - look good today.  Agree with SNF for assistance with wound management.  I would think that she would need antibiotics for a total of 7 to 10 days from the date of I&D.  I would expect she would be ready for d/c Monday or Tuesday.  Primary issue is pain on dressing changes.   2.  HTN (hypertension)  3.  Diabetes mellitus  (history of DKA)  Glucose - 99 - 11/04/2012 4.  Morbid obesity 5.  DVT prophylaxis - Lovenox   Subjective:  Still sore, but doing okay.  Works as Conservation officer, nature at [employer].  Lives with boyfriend. Objective:   Filed Vitals:   11/04/12 0622  BP: 143/89  Pulse: 102  Temp: 99.4 F (37.4 C)  Resp: 18     Intake/Output from previous day:  11/23 0701 - 11/24 0700 In: 2313.8 [I.V.:1863.8; IV Piggyback:450] Out: -   Intake/Output this shift:      Physical Exam:   General: WN moderately obese AA F who is alert and oriented.    HEENT: Normal. Pupils equal. .   Wound: Approx 12 cm wound of right inner thigh and 10 cm wound on left inner thigh.  The wounds are clean.   Lab Results:     [facility] 11/04/12 0610 11/02/12 0611  WBC 12.9* 9.7  HGB 8.6* 8.9*  HCT 26.5* 26.9*  PLT 366 326    BMET    Basename 11/04/12 0610 11/01/12 1413  NA 139 132*  K 3.6 4.1  CL 105 101  CO2 25 22  GLUCOSE 99 233*  BUN 5* 6  CREATININE 1.01 0.96  CALCIUM 8.2* 8.1*    PT/INR  No results found for this basename: LABPROT:2,INR:2 in the last 72 hours  ABG  No results found for this basename: PHART:2,PCO2:2,PO2:2,HCO3:2 in the last 72  hours   Studies/Results:  No results found.   Anti-infectives:   Anti-infectives     Start     Dose/Rate Route Frequency Ordered Stop   11/03/12 1430   clindamycin (CLEOCIN) IVPB 300 mg        300 mg 100 mL/hr over 30 Minutes Intravenous 4 times per day 11/03/12 1407     11/03/12 1200   clindamycin (CLEOCIN) IVPB 300 mg  Status:  Discontinued        300 mg 100 mL/hr over 30 Minutes Intravenous 4 times per day 11/03/12 1138 11/03/12 1407   11/03/12 1200   imipenem-cilastatin (PRIMAXIN) 500 mg in sodium chloride 0.9 % 100 mL IVPB        500 mg 200 mL/hr over 30 Minutes Intravenous 4 times per day 11/03/12 1148     10/28/12 1400   vancomycin (VANCOCIN) 1,250 mg in sodium chloride 0.9 % 250 mL IVPB  Status:  Discontinued        1,250 mg 166.7 mL/hr over 90 Minutes Intravenous Every 12 hours 10/28/12 1137 11/03/12 1138   10/28/12 1400   piperacillin-tazobactam (ZOSYN) IVPB 3.375 g  Status:  Discontinued  3.375 g 12.5 mL/hr over 240 Minutes Intravenous 3 times per day 10/28/12 1137 11/03/12 1138   10/26/12 1400   fluconazole (DIFLUCAN) IVPB 200 mg        200 mg 100 mL/hr over 60 Minutes Intravenous Every 24 hours 10/25/12 1555 10/27/12 1345   10/25/12 1300   fluconazole (DIFLUCAN) IVPB 150 mg  Status:  Discontinued        150 mg 75 mL/hr over 60 Minutes Intravenous Every 24 hours 10/25/12 1146 10/25/12 1243   10/25/12 1300   sodium chloride 0.9 % 0.01 mL with fluconazole (DIFLUCAN) 150 mg infusion  Status:  Discontinued        75 mL/hr  Intravenous Every 24 hours 10/25/12 1240 10/25/12 1554   10/24/12 1230   piperacillin-tazobactam (ZOSYN) IVPB 3.375 g  Status:  Discontinued        3.375 g 12.5 mL/hr over 240 Minutes Intravenous 3 times per day 10/24/12 1157 10/26/12 1112   10/24/12 1200   vancomycin (VANCOCIN) 1,250 mg in sodium chloride 0.9 % 250 mL IVPB  Status:  Discontinued        1,250 mg 166.7 mL/hr over 90 Minutes Intravenous Every 12 hours 10/24/12 1157  10/26/12 1112          Ovidio Kin, MD, FACS Pager: [phone],   [facility] Office: [phone] 11/04/2012

## 2012-11-05 LAB — BASIC METABOLIC PANEL
BUN: 5 mg/dL — ABNORMAL LOW (ref 6–23)
Creatinine, Ser: 1.1 mg/dL (ref 0.50–1.10)
GFR calc Af Amer: 76 mL/min — ABNORMAL LOW (ref >90)
GFR calc non Af Amer: 66 mL/min — ABNORMAL LOW (ref >90)
Potassium: 3.8 mEq/L (ref 3.5–5.1)

## 2012-11-05 LAB — ANAEROBIC CULTURE

## 2012-11-05 LAB — GLUCOSE, CAPILLARY
Glucose-Capillary: 166 mg/dL — ABNORMAL HIGH (ref 70–99)
Glucose-Capillary: 124 mg/dL — ABNORMAL HIGH (ref 70–99)

## 2012-11-05 LAB — CBC
HCT: 27.1 % — ABNORMAL LOW (ref 36.0–46.0)
MCHC: 32.1 g/dL (ref 30.0–36.0)
Platelets: 402 10*3/uL — ABNORMAL HIGH (ref 150–400)
RDW: 15.4 % (ref 11.5–15.5)

## 2012-11-05 MED ORDER — BIOTENE DRY MOUTH MT LIQD: 15.0000 mL | Freq: Two times a day (BID) | OROMUCOSAL | Status: DC | PRN

## 2012-11-05 MED ORDER — CHLORHEXIDINE GLUCONATE 0.12 % MT SOLN: 15.0000 mL | Freq: Two times a day (BID) | OROMUCOSAL | Status: DC | PRN

## 2012-11-05 MED ORDER — SULFAMETHOXAZOLE-TRIMETHOPRIM 400-80 MG PO TABS
1.0000 | ORAL_TABLET | Freq: Two times a day (BID) | ORAL | Status: DC
  Administered 2012-11-05 – 2012-11-06 (×2): 1 via ORAL
  Filled 2012-11-05 (×4): qty 1

## 2012-11-05 NOTE — Progress Notes (Signed)
Pt says that a Dr told her not to wear her CPAP tonight.

## 2012-11-05 NOTE — Progress Notes (Signed)
Patient ID: Rebecca Rhodes, female   DOB: 1981-11-02, 31 y.o.   MRN: [id number] 5 Days Post-Op  Subjective: Patient feels ok.  Still with pain, but better  Objective: Vital signs in last 24 hours: Temp:  [98.3 F (36.8 C)-99.2 F (37.3 C)] 98.5 F (36.9 C) (11/25 0527) Pulse Rate:  [91-107] 91  (11/25 0527) Resp:  [18-20] 18  (11/25 0527) BP: (131-143)/(79-92) 142/90 mmHg (11/25 0527) SpO2:  [94 %-100 %] 100 % (11/25 0527) Last BM Date: 11/04/12  Intake/Output from previous day: 11/24 0701 - 11/25 0700 In: 850 [I.V.:800; IV Piggyback:50] Out: -  Intake/Output this shift:    PE: GU: wounds packed.  Some packing removed and wound was clean.  No further evidence of infection   Lab Results:   Basename 11/05/12 0500 11/04/12 0610  WBC 12.3* 12.9*  HGB 8.7* 8.6*  HCT 27.1* 26.5*  PLT 402* 366   BMET  Basename 11/05/12 0500 11/04/12 0610  NA 141 139  K 3.8 3.6  CL 106 105  CO2 25 25  GLUCOSE 115* 99  BUN 5* 5*  CREATININE 1.10 1.01  CALCIUM 8.5 8.2*   PT/INR No results found for this basename: LABPROT:2,INR:2 in the last 72 hours CMP     Component Value Date/Time   NA 141 11/05/2012 0500   K 3.8 11/05/2012 0500   CL 106 11/05/2012 0500   CO2 25 11/05/2012 0500   GLUCOSE 115* 11/05/2012 0500   BUN 5* 11/05/2012 0500   CREATININE 1.10 11/05/2012 0500   CALCIUM 8.5 11/05/2012 0500   PROT 5.9* 11/04/2012 0610   ALBUMIN 1.6* 11/04/2012 0610   AST 29 11/04/2012 0610   ALT 6 11/04/2012 0610   ALKPHOS 53 11/04/2012 0610   BILITOT 0.2* 11/04/2012 0610   GFRNONAA 66* 11/05/2012 0500   GFRAA 76* 11/05/2012 0500   Lipase     Component Value Date/Time   LIPASE 46 10/24/2012 1137       Studies/Results: Dg Chest 2 View  11/04/2012  *RADIOLOGY REPORT*  Clinical Data: Cough.  CHEST - 2 VIEW  Comparison: 10/26/2012  Findings: Right PICC line extends into the upper SVC.  Lungs show no evidence of infiltrate or edema.  No pleural fluid is identified.  Heart size is  normal.  Visualized bony structures are unremarkable.  IMPRESSION: No active disease.  PICC line tip in upper SVC.   Original Report Authenticated By: Irish Lack, M.D.     Anti-infectives: Anti-infectives     Start     Dose/Rate Route Frequency Ordered Stop   11/03/12 1430   clindamycin (CLEOCIN) IVPB 300 mg        300 mg 100 mL/hr over 30 Minutes Intravenous 4 times per day 11/03/12 1407     11/03/12 1200   clindamycin (CLEOCIN) IVPB 300 mg  Status:  Discontinued        300 mg 100 mL/hr over 30 Minutes Intravenous 4 times per day 11/03/12 1138 11/03/12 1407   11/03/12 1200   imipenem-cilastatin (PRIMAXIN) 500 mg in sodium chloride 0.9 % 100 mL IVPB  Status:  Discontinued        500 mg 200 mL/hr over 30 Minutes Intravenous 4 times per day 11/03/12 1148 11/04/12 0906   10/28/12 1400   vancomycin (VANCOCIN) 1,250 mg in sodium chloride 0.9 % 250 mL IVPB  Status:  Discontinued        1,250 mg 166.7 mL/hr over 90 Minutes Intravenous Every 12 hours 10/28/12 1137 11/03/12 1138  10/28/12 1400   piperacillin-tazobactam (ZOSYN) IVPB 3.375 g  Status:  Discontinued        3.375 g 12.5 mL/hr over 240 Minutes Intravenous 3 times per day 10/28/12 1137 11/03/12 1138   10/26/12 1400   fluconazole (DIFLUCAN) IVPB 200 mg        200 mg 100 mL/hr over 60 Minutes Intravenous Every 24 hours 10/25/12 1555 10/27/12 1345   10/25/12 1300   fluconazole (DIFLUCAN) IVPB 150 mg  Status:  Discontinued        150 mg 75 mL/hr over 60 Minutes Intravenous Every 24 hours 10/25/12 1146 10/25/12 1243   10/25/12 1300   sodium chloride 0.9 % 0.01 mL with fluconazole (DIFLUCAN) 150 mg infusion  Status:  Discontinued        75 mL/hr  Intravenous Every 24 hours 10/25/12 1240 10/25/12 1554   10/24/12 1230   piperacillin-tazobactam (ZOSYN) IVPB 3.375 g  Status:  Discontinued        3.375 g 12.5 mL/hr over 240 Minutes Intravenous 3 times per day 10/24/12 1157 10/26/12 1112   10/24/12 1200   vancomycin (VANCOCIN)  1,250 mg in sodium chloride 0.9 % 250 mL IVPB  Status:  Discontinued        1,250 mg 166.7 mL/hr over 90 Minutes Intravenous Every 12 hours 10/24/12 1157 10/26/12 1112           Assessment/Plan  1. Bilateral thigh abscess, s/p I&D  Plan: 1. Cont dressing changes, once patient can tolerate these with oral pain medication then she can be discharged home. 2. Cont abx therapy.  Cx shows no growth   LOS: 12 days    Taylormarie Register E 11/05/2012, 9:12 AM Pager: [phone]

## 2012-11-05 NOTE — Progress Notes (Signed)
Physical Therapy Treatment Patient Details Name: Rebecca Rhodes MRN: [id number] DOB: 15-Sep-1981 Today's Date: 11/05/2012 Time: 1421-1450 PT Time Calculation (min): 29 min  PT Assessment / Plan / Recommendation Comments on Treatment Session  Pt. presents to be moving well when OOB with today's session. Pt. able to ambulate farther with RW in this session and presents to be more stable when standing and ambulating (2 rest breaks). Pt. ascended/descended 2 stairs (per PICC line length).     Follow Up Recommendations  Home health PT;Other (comment)     Does the patient have the potential to tolerate intense rehabilitation     Barriers to Discharge        Equipment Recommendations  None recommended by PT    Recommendations for Other Services    Frequency Min 3X/week   Plan      Precautions / Restrictions Precautions Precautions: Fall Restrictions Weight Bearing Restrictions: No   Pertinent Vitals/Pain Patient denies pain    Mobility  Bed Mobility Bed Mobility: Supine to Sit;Sitting - Scoot to Edge of Bed Supine to Sit: 5: Supervision;HOB flat Sitting - Scoot to Edge of Bed: 5: Supervision Details for Bed Mobility Assistance: Pt. supervision with getting to EOB. Increased time for bed mobility since pt. had I&D between her legs. Transfers Transfers: Sit to Stand;Stand to Sit Sit to Stand: 4: Min guard;With upper extremity assist;From bed Stand to Sit: 4: Min guard;With upper extremity assist;With armrests;To chair/3-in-1 Details for Transfer Assistance: Pt. min guard with transfers for steadiness since pt. stated she was a little weak from lying in bed. Pt. given VC for proper hand placement when standing to/sitting from RW. Ambulation/Gait Ambulation/Gait Assistance: 4: Min guard Ambulation Distance (Feet): 200 Feet Assistive device: Rolling walker Ambulation/Gait Assistance Details: Pt. ambulated into hallway with RW and 2 sitting rest breaks. Pt. with increased gt. Speed,  than what has been documented in previous sessions and presenting with a more typical gt. pattern vs. antalgic pattern documented in previous sessions. Pt. encouraged to pace herself since she has been less active in the hospital and to take breaks as needed. Gait Pattern: Step-through pattern;Decreased stride length Stairs: Yes Stairs Assistance: 4: Min guard Stairs Assistance Details (indicate cue type and reason): Pt. min guard on stairs for safety and steadiness. Pt. completed 2 steps per allowance of PICC line length. Provided closer guard for pt. upon descent of stairs; pt. stating fear of falling. Pt. demonstrated steadiness on stairs and educated in technique. Stair Management Technique: One rail Right;One rail Left;Alternating pattern;Step to pattern;Forwards Number of Stairs: 2  (PICC line length) Wheelchair Mobility Wheelchair Mobility: No      PT Goals Acute Rehab PT Goals PT Goal Formulation: With patient Time For Goal Achievement: 11/02/12 Potential to Achieve Goals: Good Pt will go Supine/Side to Sit: Independently;with HOB 0 degrees PT Goal: Supine/Side to Sit - Progress: Progressing toward goal Pt will go Sit to Stand: Independently PT Goal: Sit to Stand - Progress: Progressing toward goal Pt will Transfer Bed to Chair/Chair to Bed: Independently Pt will Ambulate: >150 feet;Independently PT Goal: Ambulate - Progress: Progressing toward goal Pt will Go Up / Down Stairs: 3-5 stairs;with modified independence;with least restrictive assistive device PT Goal: Up/Down Stairs - Progress: Progressing toward goal  Visit Information  Last PT Received On: 11/05/12 Assistance Needed: +1    Subjective Data  Subjective: "I am ready to walk" Patient Stated Goal: Home   Cognition  Overall Cognitive Status: Appears within functional limits for tasks assessed/performed Arousal/Alertness: Awake/alert  Orientation Level: Appears intact for tasks assessed Behavior During Session:  [facility] for tasks performed    Balance     End of Session PT - End of Session Equipment Utilized During Treatment: Gait belt Activity Tolerance: Patient tolerated treatment well Patient left: in chair;with call bell/phone within reach Nurse Communication: Mobility status    Mertie Clause, SPTA 11/05/2012, 3:16 PM

## 2012-11-05 NOTE — Progress Notes (Signed)
[facility]                                                                                Patient Demographics  Rebecca Rhodes, is a 31 y.o. female  ZOX:[id number]  [id number]  DOB - 10-29-1981  Admit date - 10/24/2012  Admitting Physician Alyson Reedy, MD  Outpatient Primary MD for the patient is Georganna Skeans, MD  LOS - 12   Chief Complaint  Patient presents with  . Shortness of Breath        Assessment & Plan    We await bed for placement.    Brief narrative:   Rebecca Rhodes is a 31 yo female who presented to [facility] via EMS on 11/13 with a chief complaint of shortness of breath. She was found to be in DKA with a AG of 20.1 and had Met encephalopathy. The patient was placed on CPAP in the ER, given 2L of NS and an insulin drip was ordered. PCCM was called for admission and further management , once her gap closed and mentation improved she was extubated and transferred out from ICU. She was transferred out by the pCCM on 11/16.  She went back into DKA, and had to be restarted on insulin drip, CT scan was done to investigate her cellulitis/abscess , Abscess was drained on 11/20 by [facility], Dressing changes are painful and the patient will need to go to SNF for IV antibiotics and dressing changes.     1. Acute respiratory failure status post extubation, respiratory status is stable , ? CPAP at night (says does not have one at home) - had Ac.Resp failure due to DKA and Met encephalopathy-clinically resolved.    2. Rt Groin Abscess - post I&D by [facility] on 11-20, D/W Dr Terie Purser [facility] and ID Dr Stasia Cavalier only, for few more days, + dressing changes at [facility].    3.DM-2 poor control at home, stable on present regimen of 70-30 and ISS  Lab Results  Component Value Date   HGBA1C >20.0* 10/29/2012    CBG (last 3)   Basename 11/05/12 0809 11/04/12 2212 11/04/12 1655  GLUCAP 134* 75 165*      4. Hypertension continue ACE inhibitor, when necessary  labetalol - stable.     5.Mild Tachycardia - 90s bedside, no chest pain or SOB, due to deconditioning, PT-OT, in chair in am.Better.      6.Acute renal insufficiency creatinine went up to 1.49, now 0.99 with IV hydration , resolved. Stop IVF.     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        9.Mild leukocytosis - likely from recent Abscess, repeat UA, CXR, monitor temp curve.    10.Macular drug rash, better after zosyn and primaxin stopped.     Code Status: Full  Family Communication: with patient  Disposition Plan: SNF    Procedures   LINES / TUBES:  Left CVC IJ 11/13 >>>  Foley 11/13 >>>  oett 11/13>>> 11/15  CULTURES:  Blood x2 11/13 >>>  Urine 11/13 >>> neg  Sputum 11/13 >>>    SIGNIFICANT EVENTS:  11/13: Intubated/1114- improved ph, neurostatus  11/4 Extubated, gap closed      Consults  PCCM, CCS, ID Dr Orvan Falconer on phone   Time Spent in minutes   35   Antibiotics    Anti-infectives     Start     Dose/Rate Route Frequency Ordered Stop   11/03/12 1430   clindamycin (CLEOCIN) IVPB 300 mg        300 mg 100 mL/hr over 30 Minutes Intravenous 4 times per day 11/03/12 1407     11/03/12 1200   clindamycin (CLEOCIN) IVPB 300 mg  Status:  Discontinued        300 mg 100 mL/hr over 30 Minutes Intravenous 4 times per day 11/03/12 1138 11/03/12 1407   11/03/12 1200   imipenem-cilastatin (PRIMAXIN) 500 mg in sodium chloride 0.9 % 100 mL IVPB  Status:  Discontinued        500 mg 200 mL/hr over 30 Minutes Intravenous 4 times per day 11/03/12 1148 11/04/12 0906   10/28/12 1400   vancomycin (VANCOCIN) 1,250 mg in sodium chloride 0.9 % 250 mL IVPB  Status:  Discontinued        1,250 mg 166.7 mL/hr over 90 Minutes Intravenous Every 12 hours 10/28/12 1137  11/03/12 1138   10/28/12 1400   piperacillin-tazobactam (ZOSYN) IVPB 3.375 g  Status:  Discontinued        3.375 g 12.5 mL/hr over 240 Minutes Intravenous 3 times per day 10/28/12 1137 11/03/12 1138   10/26/12 1400   fluconazole (DIFLUCAN) IVPB 200 mg        200 mg 100 mL/hr over 60 Minutes Intravenous Every 24 hours 10/25/12 1555 10/27/12 1345   10/25/12 1300   fluconazole (DIFLUCAN) IVPB 150 mg  Status:  Discontinued        150 mg 75 mL/hr over 60 Minutes Intravenous Every 24 hours 10/25/12 1146 10/25/12 1243   10/25/12 1300   sodium chloride 0.9 % 0.01 mL with fluconazole (DIFLUCAN) 150 mg infusion  Status:  Discontinued        75 mL/hr  Intravenous Every 24 hours 10/25/12 1240 10/25/12 1554   10/24/12 1230   piperacillin-tazobactam (ZOSYN) IVPB 3.375 g  Status:  Discontinued        3.375 g 12.5 mL/hr over 240 Minutes Intravenous 3 times per day 10/24/12 1157 10/26/12 1112   10/24/12 1200   vancomycin (VANCOCIN) 1,250 mg in sodium chloride 0.9 % 250 mL IVPB  Status:  Discontinued        1,250 mg 166.7 mL/hr over 90 Minutes Intravenous Every 12 hours 10/24/12 1157 10/26/12 1112          Scheduled Meds:    . antiseptic oral rinse  15 mL Mouth Rinse QID  . chlorhexidine  15 mL Mouth/Throat BID  . clindamycin (CLEOCIN) IV  300 mg Intravenous Q6H  . enoxaparin  40 mg Subcutaneous Q24H  . fentaNYL  200 mcg Intravenous Once  . insulin aspart  0-20 Units Subcutaneous TID WC  . insulin aspart protamine-insulin aspart  30 Units Subcutaneous BID WC  . labetalol  100 mg Oral BID  . lisinopril  10 mg Oral Daily  . potassium chloride  40 mEq Oral BID  . [DISCONTINUED] pantoprazole  40 mg Oral Daily  . [DISCONTINUED] sodium bicarbonate  650 mg Oral BID   Continuous Infusions:    . [DISCONTINUED]  sodium chloride 75 mL/hr (11/02/12 0311)   PRN Meds:.dextrose, diphenhydrAMINE, fentaNYL, HYDROmorphone (DILAUDID) injection, labetalol, ondansetron (ZOFRAN) IV, oxyCODONE-acetaminophen,  sodium chloride   DVT Prophylaxis  Lovenox    Lab Results  Component Value Date   PLT 402* 11/05/2012      Susa Raring K M.D on 11/05/2012 at 9:52 AM  Between 7am to 7pm - Pager - [phone]  After 7pm go to [email or web address] - password [email or web address]  And look for the night coverage person covering for me after hours  [facility] Office  [phone]    Subjective:   Rebecca Rhodes today has, No headache, No chest pain, No abdominal pain - No Nausea, No new weakness tingling or numbness, No Cough - SOB.    Objective:   Filed Vitals:   11/04/12 2133 11/04/12 2355 11/05/12 0234 11/05/12 0527  BP: 135/92  131/84 142/90  Pulse: 96 95 93 91  Temp: 98.8 F (37.1 C)  98.7 F (37.1 C) 98.5 F (36.9 C)  TempSrc: Oral     Resp: 19 18 18 18   Height:      Weight:      SpO2: 98% 100% 98% 100%    Wt Readings from Last 3 Encounters:  10/26/12 91.6 kg (201 lb 15.1 oz)  10/26/12 91.6 kg (201 lb 15.1 oz)  06/04/12 100.8 kg (222 lb 3.6 oz)     Intake/Output Summary (Last 24 hours) at 11/05/12 0952 Last data filed at 11/04/12 1400  Gross per 24 hour  Intake    850 ml  Output      0 ml  Net    850 ml    Exam Awake Alert, Oriented X 3, No new F.N deficits, Normal affect [place].AT,PERRAL Supple Neck,No JVD, No cervical lymphadenopathy appriciated.  Symmetrical Chest wall movement, Good air movement bilaterally, CTAB RRR,No Gallops,Rubs or new Murmurs, No Parasternal Heave +ve B.Sounds, Abd Soft, Non tender, No organomegaly appriciated, No rebound - guarding or rigidity. No Cyanosis, Clubbing or edema, No new Rash or bruise, Rt groin area appears healing well. Diffuse drug macular rash improving   Data Review   Micro Results Recent Results (from the past 240 hour(s))  CULTURE, ROUTINE-ABSCESS     Status: Normal   Collection Time   10/31/12 12:56 PM      Component Value Range Status Comment   Specimen Description ABSCESS THIGH RIGHT   Final    Special Requests NONE    Final    Gram Stain     Final    Value: FEW WBC PRESENT,BOTH PMN AND MONONUCLEAR     NO SQUAMOUS EPITHELIAL CELLS SEEN     RARE GRAM POSITIVE COCCI   Culture NO GROWTH 3 DAYS   Final    Report Status 11/03/2012 FINAL   Final   ANAEROBIC CULTURE     Status: Normal (Preliminary result)   Collection Time   10/31/12 12:56 PM      Component Value Range Status Comment   Specimen Description ABSCESS THIGH RIGHT   Final    Special Requests NONE   Final    Gram Stain PENDING   Incomplete    Culture     Final    Value: NO ANAEROBES ISOLATED; CULTURE IN PROGRESS FOR 5 DAYS   Report Status PENDING   Incomplete   AFB CULTURE WITH SMEAR     Status: Normal (Preliminary result)   Collection Time   10/31/12 12:56 PM      Component Value Range Status Comment  Specimen Description ABSCESS RIGHT THIGH   Final    Special Requests NONE   Final    ACID FAST SMEAR NO ACID FAST BACILLI SEEN   Final    Culture     Final    Value: CULTURE WILL BE EXAMINED FOR 6 WEEKS BEFORE ISSUING A FINAL REPORT   Report Status PENDING   Incomplete   CULTURE, ROUTINE-ABSCESS     Status: Normal   Collection Time   10/31/12 12:56 PM      Component Value Range Status Comment   Specimen Description ABSCESS LEFT THIGH   Final    Special Requests NONE   Final    Gram Stain     Final    Value: FEW WBC PRESENT,BOTH PMN AND MONONUCLEAR     RARE SQUAMOUS EPITHELIAL CELLS PRESENT     NO ORGANISMS SEEN   Culture NO GROWTH 3 DAYS   Final    Report Status 11/03/2012 FINAL   Final   ANAEROBIC CULTURE     Status: Normal (Preliminary result)   Collection Time   10/31/12 12:56 PM      Component Value Range Status Comment   Specimen Description ABSCESS LEFT THIGH   Final    Special Requests NONE   Final    Gram Stain PENDING   Incomplete    Culture     Final    Value: NO ANAEROBES ISOLATED; CULTURE IN PROGRESS FOR 5 DAYS   Report Status PENDING   Incomplete   AFB CULTURE WITH SMEAR     Status: Normal (Preliminary result)    Collection Time   10/31/12 12:56 PM      Component Value Range Status Comment   Specimen Description ABSCESS LEFT THIGH   Final    Special Requests NONE   Final    ACID FAST SMEAR NO ACID FAST BACILLI SEEN   Final    Culture     Final    Value: CULTURE WILL BE EXAMINED FOR 6 WEEKS BEFORE ISSUING A FINAL REPORT   Report Status PENDING   Incomplete     Radiology Reports Ct Femur Left W Contrast  10/29/2012  *RADIOLOGY REPORT*  Clinical Data: Upper thigh pain.  Cellulitis.  Leukocytosis.  CT OF THE LEFT FEMUR WITH CONTRAST  Contrast: OMNIPAQUE IOHEXOL 300 MG/ML  SOLN  Comparison: None.  Findings: There are focal areas of cellulitis in the proximal medial aspects of both thighs, more prominent on the left than the right.  There is a poorly defined fluid collection in the soft tissues of the medial aspect of the left thigh measuring 8.4 x 4.2 x 3.2 cm.  This is not a well defined abscess.  There is prominent edema in the adjacent subcutaneous fat consistent with cellulitis.  There is a slightly more anterior area of subcutaneous edema, poorly defined in the proximal right thigh.  The adjacent muscle structures appear normal.  There is slight reactive adenopathy in the left inguinal region.  No acute osseous abnormality.  There is a small nonspecific left knee effusion.  IMPRESSION: Cellulitis in the proximal medial aspects of both thighs, more prominent on the left than the right.  There are poorly defined fluid collections in the subcutaneous fat without a well marginated abscess. I suspect some pus could be obtained from the proximal left thigh but the fluid collection is not well defined.   Original Report Authenticated By: Francene Boyers, M.D.    Dg Chest Portable 1 View  10/24/2012  *  RADIOLOGY REPORT*  Clinical Data: ETT placement  PORTABLE CHEST - 1 VIEW  Comparison: 10/24/2012 at 0757 hours  Findings: Endotracheal tube terminates 5 cm above the carina.  Left IJ venous catheter terminates  at/just above the cavoatrial junction.  Lungs are essentially clear.  No pleural effusion or pneumothorax.  The heart is normal in size.  IMPRESSION: Endotracheal tube terminates 5 cm above the carina.  Left IJ venous catheter terminates at/just above the cavoatrial junction.  No pneumothorax.   Original Report Authenticated By: Charline Bills, M.D.    Dg Chest Port 1 View  10/24/2012  *RADIOLOGY REPORT*  Clinical Data: Shortness of breath.  PORTABLE CHEST - 1 VIEW  Comparison: 06/05/2012  Findings: The heart size and pulmonary vascularity are normal and the lungs are clear.  No osseous abnormality.  IMPRESSION: Normal chest.   Original Report Authenticated By: Francene Boyers, M.D.     CBC  Lab 11/05/12 0500 11/04/12 0610 11/02/12 0611 11/01/12 0620 10/31/12 0615  WBC 12.3* 12.9* 9.7 11.0* 13.8*  HGB 8.7* 8.6* 8.9* 9.2* 9.4*  HCT 27.1* 26.5* 26.9* 27.3* 28.1*  PLT 402* 366 326 311 278  MCV 88.9 88.6 88.5 88.6 87.5  MCH 28.5 28.8 29.3 29.9 29.3  MCHC 32.1 32.5 33.1 33.7 33.5  RDW 15.4 15.0 15.0 15.2 15.0  LYMPHSABS -- -- -- -- --  MONOABS -- -- -- -- --  EOSABS -- -- -- -- --  BASOSABS -- -- -- -- --  BANDABS -- -- -- -- --    Chemistries   Lab 11/05/12 0500 11/04/12 0610 11/01/12 1413 11/01/12 0620 10/30/12 0637  NA 141 139 132* 135 134*  K 3.8 3.6 4.1 4.4 4.3  CL 106 105 101 104 107  CO2 25 25 22  18* 14*  GLUCOSE 115* 99 233* 231* 280*  BUN 5* 5* 6 5* 10  CREATININE 1.10 1.01 0.96 1.04 0.99  CALCIUM 8.5 8.2* 8.1* 8.3* 8.3*  MG -- -- -- -- --  AST -- 29 14 -- --  ALT -- 6 <5 -- --  ALKPHOS -- 53 56 -- --  BILITOT -- 0.2* 0.2* -- --   ------------------------------------------------------------------------------------------------------------------ estimated creatinine clearance is 80.8 ml/min (by C-G formula based on Cr of 1.1). ------------------------------------------------------------------------------------------------------------------ No results found for this  basename: HGBA1C:2 in the last 72 hours ------------------------------------------------------------------------------------------------------------------ No results found for this basename: CHOL:2,HDL:2,LDLCALC:2,TRIG:2,CHOLHDL:2,LDLDIRECT:2 in the last 72 hours ------------------------------------------------------------------------------------------------------------------  Basename 11/04/12 1455  TSH 1.155  T4TOTAL --  T3FREE --  THYROIDAB --   ------------------------------------------------------------------------------------------------------------------ No results found for this basename: VITAMINB12:2,FOLATE:2,FERRITIN:2,TIBC:2,IRON:2,RETICCTPCT:2 in the last 72 hours  Coagulation profile No results found for this basename: INR:5,PROTIME:5 in the last 168 hours  No results found for this basename: DDIMER:2 in the last 72 hours  Cardiac Enzymes No results found for this basename: CK:3,CKMB:3,TROPONINI:3,MYOGLOBIN:3 in the last 168 hours ------------------------------------------------------------------------------------------------------------------ No components found with this basename: POCBNP:3

## 2012-11-05 NOTE — Progress Notes (Signed)
Plan as below  Rebecca Rhodes. Andrey Campanile, MD, FACS General, Bariatric, & Minimally Invasive Surgery [facility] Surgery, [place]

## 2012-11-06 LAB — URINE CULTURE: Culture: NO GROWTH

## 2012-11-06 LAB — GLUCOSE, CAPILLARY: Glucose-Capillary: 145 mg/dL — ABNORMAL HIGH (ref 70–99)

## 2012-11-06 MED ORDER — DIPHENHYDRAMINE HCL 25 MG PO CAPS: 25.0000 mg | ORAL_CAPSULE | Freq: Four times a day (QID) | ORAL | Status: DC | PRN

## 2012-11-06 MED ORDER — OXYCODONE-ACETAMINOPHEN 5-325 MG PO TABS: 1.0000 | ORAL_TABLET | ORAL | Status: DC | PRN

## 2012-11-06 MED ORDER — INSULIN REGULAR HUMAN 100 UNIT/ML IJ SOLN: 10.0000 [IU] | Freq: Three times a day (TID) | INTRAMUSCULAR | Status: DC

## 2012-11-06 MED ORDER — SULFAMETHOXAZOLE-TRIMETHOPRIM 400-80 MG PO TABS: 1.0000 | ORAL_TABLET | Freq: Two times a day (BID) | ORAL | Status: DC

## 2012-11-06 MED ORDER — INSULIN ASPART PROT & ASPART (70-30 MIX) 100 UNIT/ML ~~LOC~~ SUSP: 30.0000 [IU] | Freq: Every day | SUBCUTANEOUS | Status: DC

## 2012-11-06 NOTE — Discharge Summary (Signed)
[facility]                                                                                   Rebecca Rhodes, is a 31 y.o. female  DOB 06/19/1981  MRN [id number].  Admission date:  10/24/2012  Discharge Date:  11/06/2012  Primary MD  Georganna Skeans, MD  Admitting Physician  Alyson Reedy, MD  Admission Diagnosis  Dehydration [276.51] DKA (diabetic ketoacidoses) [250.10] Dyspnea [786.09] posterior/medial thigh abscess  Discharge Diagnosis     Principal Problem:  *DKA (diabetic ketoacidoses) Active Problems:  Leukocytosis  HTN (hypertension)  Diabetes mellitus  Abscess of buttock, left  Acute respiratory failure       Past Medical History  Diagnosis Date  . Diabetes mellitus   . Hypertension   . HTN (hypertension) 06/04/2012  . Hyperlipidemia 06/04/2012  . Diabetes mellitus 06/04/2012    Past Surgical History  Procedure Date  . Irrigation and debridement abscess 10/31/2012    Procedure: IRRIGATION AND DEBRIDEMENT ABSCESS;  Surgeon: Cherylynn Ridges, MD;  Location: [facility] OR;  Service: General;  Laterality: Bilateral;       Discharge Diagnoses:   Principal Problem:  *DKA (diabetic ketoacidoses) Active Problems:  Leukocytosis  HTN (hypertension)  Diabetes mellitus  Abscess of buttock, left  Acute respiratory failure    Discharge Condition:Stable   Diet recommendation: See Discharge Instructions below   Consults [facility]CS   History of present illness and  Hospital Course:  See H&P, Labs, Consult and Test reports for all details in brief,     Brief narrative:   Rebecca Rhodes is a 31 yo female who presented to [facility] via EMS on 11/13 with a chief complaint of shortness of breath. She was found to be in DKA with a AG of 20.1 and had Met encephalopathy. The patient was placed on CPAP in the ER, given 2L of NS and an insulin drip was ordered. PCCM was called for admission and further management , once her gap closed and mentation improved she was  extubated and transferred out from ICU. She was transferred out by the [facility] on 11/16.    She went back into DKA, and had to be restarted on insulin drip, CT scan was done to investigate her cellulitis/abscess , Abscess was drained on 11/20 by [facility]CS, not tolerating dressing changes well on oral pain medications , look much better and will be discharged home with home health with dressing changes at home.    1. Acute respiratory failure status post extubation, respiratory status is stable , is off of any oxygen. - had Ac.Resp failure due to DKA and Met encephalopathy-clinically resolved.     2. Rt Groin Abscess - post I&D by [facility] on 11-20, D/W Dr Terie Purser [facility] and ID Dr Orvan Falconer, since she developed drug rash to clindamycin she has been switched to Bactrim, will give her antibiotics for a few more days as wounds are almost completely healed from infection standpoint, we'll go home with home health for dressing changes which will be twice a day, patient has been counseled to keep wounds clean and dry outpatient surgery followup.     3.DM-2 poor control at home, stable  on present regimen of 70-30 which has been increased from home dose and continue sliding scale as before  Lab Results   Component  Value  Date    HGBA1C  >20.0*  10/29/2012     CBG (last 3)   Basename 11/06/12 0813 11/05/12 2139 11/05/12 1714  GLUCAP 145* 124* 96       4. Hypertension -able resume home medications.     5.Acute renal insufficiency creatinine went up to 1.49, now 0.99 with IV hydration , resolved after hydration.     6.AOCD + dilutional Anemia - hemoglobin has come down from 16.0 to 8.9, postoperative/ dilutional, no obvious bleeding, monitor CBC out patient.     7.Metabolic acidosis, suspected RTA vs DKA/Infection - stable on PO Bicarb, stopped bicarbonate and remained stable. We'll request PCP to check another CMP in a week.     8.Mild leukocytosis - likely from recent Abscess, repeat UA,  CXR, stable temp curve.      9.Macular drug rash, better after zosyn and primaxin stopped. Ever rash got worse on clindamycin, since then has stopped rash much improved.       Today   Subjective:   Rebecca Rhodes today has no headache,no chest abdominal pain,no new weakness tingling or numbness, feels much better wants to go home today.    Objective:   Blood pressure 154/80, pulse 100, temperature 98.1 F (36.7 C), temperature source Oral, resp. rate 18, height 5' 4.17" (1.63 m), weight 91.6 kg (201 lb 15.1 oz), last menstrual period 11/01/2012, SpO2 95.00%.   Intake/Output Summary (Last 24 hours) at 11/06/12 1141 Last data filed at 11/06/12 0945  Gross per 24 hour  Intake    600 ml  Output   1000 ml  Net   -400 ml    Exam Awake Alert, Oriented *3, No new F.N deficits, Normal affect Tensas.AT,PERRAL Supple Neck,No JVD, No cervical lymphadenopathy appriciated.  Symmetrical Chest wall movement, Good air movement bilaterally, CTAB RRR,No Gallops,Rubs or new Murmurs, No Parasternal Heave +ve B.Sounds, Abd Soft, Non tender, No organomegaly appriciated, No rebound -guarding or rigidity. No Cyanosis, Clubbing or edema, No new Rash or bruise  Data Review   Major procedures and Radiology Reports - PLEASE review detailed and final reports for all details in brief -       Dg Chest 2 View  11/04/2012  *RADIOLOGY REPORT*  Clinical Data: Cough.  CHEST - 2 VIEW  Comparison: 10/26/2012  Findings: Right PICC line extends into the upper SVC.  Lungs show no evidence of infiltrate or edema.  No pleural fluid is identified.  Heart size is normal.  Visualized bony structures are unremarkable.  IMPRESSION: No active disease.  PICC line tip in upper SVC.   Original Report Authenticated By: Irish Lack, M.D.    Ct Femur Left W Contrast  10/29/2012  *RADIOLOGY REPORT*  Clinical Data: Upper thigh pain.  Cellulitis.  Leukocytosis.  CT OF THE LEFT FEMUR WITH CONTRAST  Contrast: OMNIPAQUE  IOHEXOL 300 MG/ML  SOLN  Comparison: None.  Findings: There are focal areas of cellulitis in the proximal medial aspects of both thighs, more prominent on the left than the right.  There is a poorly defined fluid collection in the soft tissues of the medial aspect of the left thigh measuring 8.4 x 4.2 x 3.2 cm.  This is not a well defined abscess.  There is prominent edema in the adjacent subcutaneous fat consistent with cellulitis.  There is a slightly more anterior area  of subcutaneous edema, poorly defined in the proximal right thigh.  The adjacent muscle structures appear normal.  There is slight reactive adenopathy in the left inguinal region.  No acute osseous abnormality.  There is a small nonspecific left knee effusion.  IMPRESSION: Cellulitis in the proximal medial aspects of both thighs, more prominent on the left than the right.  There are poorly defined fluid collections in the subcutaneous fat without a well marginated abscess. I suspect some pus could be obtained from the proximal left thigh but the fluid collection is not well defined.   Original Report Authenticated By: Francene Boyers, M.D.    Dg Chest Portable 1 View  10/24/2012  *RADIOLOGY REPORT*  Clinical Data: ETT placement  PORTABLE CHEST - 1 VIEW  Comparison: 10/24/2012 at 0757 hours  Findings: Endotracheal tube terminates 5 cm above the carina.  Left IJ venous catheter terminates at/just above the cavoatrial junction.  Lungs are essentially clear.  No pleural effusion or pneumothorax.  The heart is normal in size.  IMPRESSION: Endotracheal tube terminates 5 cm above the carina.  Left IJ venous catheter terminates at/just above the cavoatrial junction.  No pneumothorax.   Original Report Authenticated By: Charline Bills, M.D.    Dg Chest Port 1 View  10/24/2012  *RADIOLOGY REPORT*  Clinical Data: Shortness of breath.  PORTABLE CHEST - 1 VIEW  Comparison: 06/05/2012  Findings: The heart size and pulmonary vascularity are normal and the  lungs are clear.  No osseous abnormality.  IMPRESSION: Normal chest.   Original Report Authenticated By: Francene Boyers, M.D.     Micro Results      Recent Results (from the past 240 hour(s))  CULTURE, ROUTINE-ABSCESS     Status: Normal   Collection Time   10/31/12 12:56 PM      Component Value Range Status Comment   Specimen Description ABSCESS THIGH RIGHT   Final    Special Requests NONE   Final    Gram Stain     Final    Value: FEW WBC PRESENT,BOTH PMN AND MONONUCLEAR     NO SQUAMOUS EPITHELIAL CELLS SEEN     RARE GRAM POSITIVE COCCI   Culture NO GROWTH 3 DAYS   Final    Report Status 11/03/2012 FINAL   Final   ANAEROBIC CULTURE     Status: Normal   Collection Time   10/31/12 12:56 PM      Component Value Range Status Comment   Specimen Description ABSCESS THIGH RIGHT   Final    Special Requests NONE   Final    Gram Stain     Final    Value: FEW WBC PRESENT,BOTH PMN AND MONONUCLEAR     NO SQUAMOUS EPITHELIAL CELLS SEEN     RARE GRAM POSITIVE COCCI   Culture NO ANAEROBES ISOLATED   Final    Report Status 11/05/2012 FINAL   Final   AFB CULTURE WITH SMEAR     Status: Normal (Preliminary result)   Collection Time   10/31/12 12:56 PM      Component Value Range Status Comment   Specimen Description ABSCESS RIGHT THIGH   Final    Special Requests NONE   Final    ACID FAST SMEAR NO ACID FAST BACILLI SEEN   Final    Culture     Final    Value: CULTURE WILL BE EXAMINED FOR 6 WEEKS BEFORE ISSUING A FINAL REPORT   Report Status PENDING   Incomplete   CULTURE, ROUTINE-ABSCESS  Status: Normal   Collection Time   10/31/12 12:56 PM      Component Value Range Status Comment   Specimen Description ABSCESS LEFT THIGH   Final    Special Requests NONE   Final    Gram Stain     Final    Value: FEW WBC PRESENT,BOTH PMN AND MONONUCLEAR     RARE SQUAMOUS EPITHELIAL CELLS PRESENT     NO ORGANISMS SEEN   Culture NO GROWTH 3 DAYS   Final    Report Status 11/03/2012 FINAL   Final     ANAEROBIC CULTURE     Status: Normal   Collection Time   10/31/12 12:56 PM      Component Value Range Status Comment   Specimen Description ABSCESS LEFT THIGH   Final    Special Requests NONE   Final    Gram Stain     Final    Value: FEW WBC PRESENT,BOTH PMN AND MONONUCLEAR     RARE SQUAMOUS EPITHELIAL CELLS PRESENT     NO ORGANISMS SEEN   Culture NO ANAEROBES ISOLATED   Final    Report Status 11/05/2012 FINAL   Final   AFB CULTURE WITH SMEAR     Status: Normal (Preliminary result)   Collection Time   10/31/12 12:56 PM      Component Value Range Status Comment   Specimen Description ABSCESS LEFT THIGH   Final    Special Requests NONE   Final    ACID FAST SMEAR NO ACID FAST BACILLI SEEN   Final    Culture     Final    Value: CULTURE WILL BE EXAMINED FOR 6 WEEKS BEFORE ISSUING A FINAL REPORT   Report Status PENDING   Incomplete   URINE CULTURE     Status: Normal   Collection Time   11/04/12  3:06 PM      Component Value Range Status Comment   Specimen Description URINE, RANDOM   Final    Special Requests ADDED ON 11/04/12 AT 1659   Final    Culture  Setup Time 11/05/2012 09:28   Final    Colony Count NO GROWTH   Final    Culture NO GROWTH   Final    Report Status 11/06/2012 FINAL   Final      CBC w Diff: Lab Results  Component Value Date   WBC 12.3* 11/05/2012   HGB 8.7* 11/05/2012   HCT 27.1* 11/05/2012   PLT 402* 11/05/2012   LYMPHOPCT 12 10/24/2012   MONOPCT 12 10/24/2012   EOSPCT 0 10/24/2012   BASOPCT 0 10/24/2012    CMP: Lab Results  Component Value Date   NA 141 11/05/2012   K 3.8 11/05/2012   CL 106 11/05/2012   CO2 25 11/05/2012   BUN 5* 11/05/2012   CREATININE 1.10 11/05/2012   PROT 5.9* 11/04/2012   ALBUMIN 1.6* 11/04/2012   BILITOT 0.2* 11/04/2012   ALKPHOS 53 11/04/2012   AST 29 11/04/2012   ALT 6 11/04/2012  .   Discharge Instructions     Follow with Primary MD Georganna Skeans, MD in 3 days . Keep your wounds clean and dry at all  times.  Get CBC, CMP, checked 3 days by Primary MD and again as instructed by your Primary MD.    Get Medicines reviewed and adjusted.  Please request your Prim.MD to go over all Hospital Tests and Procedure/Radiological results at the follow up, please get all Hospital records sent to your [facility]  MD by signing hospital release before you go home.  Activity: As tolerated with Full fall precautions use walker/cane & assistance as needed   Diet:  Heart healthy low carbohydrate.  For Heart failure patients - Check your Weight same time everyday, if you gain over 2 pounds, or you develop in leg swelling, experience more shortness of breath or chest pain, call your Primary MD immediately. Follow Cardiac Low Salt Diet and 1.8 lit/day fluid restriction.  Disposition Home    If you experience worsening of your admission symptoms, develop shortness of breath, life threatening emergency, suicidal or homicidal thoughts you must seek medical attention immediately by calling 911 or calling your MD immediately  if symptoms less severe.  You Must read complete instructions/literature along with all the possible adverse reactions/side effects for all the Medicines you take and that have been prescribed to you. Take any new Medicines after you have completely understood and accpet all the possible adverse reactions/side effects.   Do not drive and provide baby sitting services if your were admitted for syncope or siezures until you have seen by Primary MD or a Neurologist and advised to do so again.  Do not drive when taking Pain medications.    Do not take more than prescribed Pain, Sleep and Anxiety Medications  Special Instructions: If you have smoked or chewed Tobacco  in the last 2 yrs please stop smoking, stop any regular Alcohol  and or any Recreational drug use.  Wear Seat belts while driving.   Rebecca Rhodes was admitted to the Hospital on 10/24/2012 and Discharged  11/06/2012 and should be  excused from work/school   for 21  days starting 10/24/2012 , may return to work/school without any restrictions.  Call Susa Raring MD, [facility] [phone] with questions.  Leroy Sea M.D on 11/06/2012,at 11:31 AM  [facility] Office  [phone]   Follow-up Information    Follow up with [facility], MD. Schedule an appointment as soon as possible for a visit in 3 days.   Contact information:   [address] [place] [address] [phone]       Follow up with [facility], MD. Schedule an appointment as soon as possible for a visit in 1 week.   Contact information:   [name] [place] [address] [phone]            Discharge Medications     Medication List     As of 11/06/2012 11:41 AM    START taking these medications         diphenhydrAMINE 25 mg capsule   Commonly known as: BENADRYL   Take 1 capsule (25 mg total) by mouth every 6 (six) hours as needed for itching.      oxyCODONE-acetaminophen 5-325 MG per tablet   Commonly known as: PERCOCET/ROXICET   Take 1-2 tablets by mouth every 4 (four) hours as needed.      sulfamethoxazole-trimethoprim 400-80 MG per tablet   Commonly known as: BACTRIM,SEPTRA   Take 1 tablet by mouth every 12 (twelve) hours.      CHANGE how you take these medications         insulin aspart protamine-insulin aspart (70-30) 100 UNIT/ML injection   Commonly known as: NOVOLOG 70/30   Inject 30 Units into the skin daily with supper.   What changed: dose      CONTINUE taking these medications         insulin regular 100 units/mL injection  Commonly known as: NOVOLIN R,HUMULIN R   Inject 0.1-0.25 mLs (10-25 Units total) into the skin 3 (three) times daily before meals. Per sliding scale.      labetalol 200 MG tablet   Commonly known as: NORMODYNE      lisinopril 10 MG tablet   Commonly known as: PRINIVIL,ZESTRIL      STOP taking these medications          levofloxacin 750 MG tablet   Commonly known as: LEVAQUIN          Where to get your medications    These are the prescriptions that you need to pick up.   You may get these medications from any pharmacy.         diphenhydrAMINE 25 mg capsule   insulin aspart protamine-insulin aspart (70-30) 100 UNIT/ML injection   insulin regular 100 units/mL injection   oxyCODONE-acetaminophen 5-325 MG per tablet   sulfamethoxazole-trimethoprim 400-80 MG per tablet               Total Time in preparing paper work, data evaluation and todays exam - 35 minutes  Leroy Sea M.D on 11/06/2012 at 11:41 AM  [facility] Office  [phone]

## 2012-11-06 NOTE — Progress Notes (Signed)
[facility] Progress Note 6 Days Post-Op  Subjective: Patient slow to respond.  No acute distress  Objective: Vital signs in last 24 hours: Temp:  [98.1 F (36.7 C)-98.8 F (37.1 C)] 98.1 F (36.7 C) (11/26 0515) Pulse Rate:  [94-100] 100  (11/26 0515) Resp:  [18-20] 18  (11/26 0515) BP: (130-154)/(80-87) 154/80 mmHg (11/26 0515) SpO2:  [95 %-98 %] 95 % (11/26 0515) Last BM Date: 11/05/12  Intake/Output from previous day: 11/25 0701 - 11/26 0700 In: 720 [P.O.:720] Out: 1000 [Urine:1000] Intake/Output this shift:    General: No distress  Lungs: Clear  Abd: Benign  Extremities: Right upper inner thigh wound is packed with gauze, seems to have excellent granulation tissue.  Neuro: Intact  Lab Results:  Micro:  No growth, likely because patient was on antibiotics prior to cultures being taken.  BMET  Basename 11/05/12 0500 11/04/12 0610  NA 141 139  K 3.8 3.6  CL 106 105  CO2 25 25  GLUCOSE 115* 99  BUN 5* 5*  CREATININE 1.10 1.01  CALCIUM 8.5 8.2*   PT/INR No results found for this basename: LABPROT:2,INR:2 in the last 72 hours ABG No results found for this basename: PHART:2,PCO2:2,PO2:2,HCO3:2 in the last 72 hours  Studies/Results: Dg Chest 2 View  11/04/2012  *RADIOLOGY REPORT*  Clinical Data: Cough.  CHEST - 2 VIEW  Comparison: 10/26/2012  Findings: Right PICC line extends into the upper SVC.  Lungs show no evidence of infiltrate or edema.  No pleural fluid is identified.  Heart size is normal.  Visualized bony structures are unremarkable.  IMPRESSION: No active disease.  PICC line tip in upper SVC.   Original Report Authenticated By: Irish Lack, M.D.     Anti-infectives: Anti-infectives     Start     Dose/Rate Route Frequency Ordered Stop   11/05/12 1330  sulfamethoxazole-trimethoprim (BACTRIM,SEPTRA) 400-80 MG per tablet 1 tablet       1 tablet Oral Every 12 hours 11/05/12 1235     11/03/12 1430   clindamycin (CLEOCIN) IVPB 300 mg  Status:   Discontinued        300 mg 100 mL/hr over 30 Minutes Intravenous 4 times per day 11/03/12 1407 11/05/12 1235   11/03/12 1200   clindamycin (CLEOCIN) IVPB 300 mg  Status:  Discontinued        300 mg 100 mL/hr over 30 Minutes Intravenous 4 times per day 11/03/12 1138 11/03/12 1407   11/03/12 1200   imipenem-cilastatin (PRIMAXIN) 500 mg in sodium chloride 0.9 % 100 mL IVPB  Status:  Discontinued        500 mg 200 mL/hr over 30 Minutes Intravenous 4 times per day 11/03/12 1148 11/04/12 0906   10/28/12 1400   vancomycin (VANCOCIN) 1,250 mg in sodium chloride 0.9 % 250 mL IVPB  Status:  Discontinued        1,250 mg 166.7 mL/hr over 90 Minutes Intravenous Every 12 hours 10/28/12 1137 11/03/12 1138   10/28/12 1400   piperacillin-tazobactam (ZOSYN) IVPB 3.375 g  Status:  Discontinued        3.375 g 12.5 mL/hr over 240 Minutes Intravenous 3 times per day 10/28/12 1137 11/03/12 1138   10/26/12 1400   fluconazole (DIFLUCAN) IVPB 200 mg        200 mg 100 mL/hr over 60 Minutes Intravenous Every 24 hours 10/25/12 1555 10/27/12 1345   10/25/12 1300   fluconazole (DIFLUCAN) IVPB 150 mg  Status:  Discontinued        150  mg 75 mL/hr over 60 Minutes Intravenous Every 24 hours 10/25/12 1146 10/25/12 1243   10/25/12 1300   sodium chloride 0.9 % 0.01 mL with fluconazole (DIFLUCAN) 150 mg infusion  Status:  Discontinued        75 mL/hr  Intravenous Every 24 hours 10/25/12 1240 10/25/12 1554   10/24/12 1230   piperacillin-tazobactam (ZOSYN) IVPB 3.375 g  Status:  Discontinued        3.375 g 12.5 mL/hr over 240 Minutes Intravenous 3 times per day 10/24/12 1157 10/26/12 1112   10/24/12 1200   vancomycin (VANCOCIN) 1,250 mg in sodium chloride 0.9 % 250 mL IVPB  Status:  Discontinued        1,250 mg 166.7 mL/hr over 90 Minutes Intravenous Every 12 hours 10/24/12 1157 10/26/12 1112          Assessment/Plan: s/p Procedure(s): IRRIGATION AND DEBRIDEMENT ABSCESS Continue ABX therapy due to Post-op  infection Discharge May discharge when proper arrangement can be made for wound care.  LOS: 13 days   Marta Lamas. Gae Bon, MD, FACS [phone] [facility] 11/06/2012

## 2012-11-06 NOTE — Progress Notes (Signed)
Discharge home.

## 2012-11-06 NOTE — Clinical Social Work Note (Signed)
Clinical Social Worker reviewed chart and saw that the discharge plans has changed to home with home health services to follow. SNF placement will not be appropriate for patient. CSW will sign off, as social work intervention is no longer needed.   Rozetta Nunnery MSW, [employer] [phone]

## 2012-11-06 NOTE — Progress Notes (Signed)
Physical Therapy Treatment Patient Details Name: Rebecca Rhodes MRN: [id number] DOB: 1981/10/23 Today's Date: 11/06/2012 Time: 4098-1191 PT Time Calculation (min): 26 min  PT Assessment / Plan / Recommendation Comments on Treatment Session  pt presents with DKA, Metabolic Encephalopathy, and R Groin abscess s/p I+D.  pt notes feeling better today and excited to D/C today.  Anticipate pt will make great progress, but would benefit from HHPT and RW at D/C.      Follow Up Recommendations  Home health PT;Supervision - Intermittent     Does the patient have the potential to tolerate intense rehabilitation     Barriers to Discharge        Equipment Recommendations  Rolling walker with 5" wheels    Recommendations for Other Services    Frequency Min 3X/week   Plan Discharge plan remains appropriate;Frequency remains appropriate    Precautions / Restrictions Precautions Precautions: Fall Restrictions Weight Bearing Restrictions: No   Pertinent Vitals/Pain Denies pain.    Mobility  Bed Mobility Bed Mobility: Supine to Sit;Sitting - Scoot to Edge of Bed Supine to Sit: 6: Modified independent (Device/Increase time);With rails Sitting - Scoot to Edge of Bed: 6: Modified independent (Device/Increase time) Details for Bed Mobility Assistance: pt moving well this morning.   Transfers Transfers: Sit to Stand;Stand to Sit Sit to Stand: 5: Supervision;With upper extremity assist;From bed Stand to Sit: 5: Supervision;With upper extremity assist;To chair/3-in-1;With armrests Details for Transfer Assistance: pt demos good use of UEs,   Ambulation/Gait Ambulation/Gait Assistance: 5: Supervision Ambulation Distance (Feet): 200 Feet Assistive device: Rolling walker Ambulation/Gait Assistance Details: pt needed one seated rest break.  pt moves slowly, but demos good safety with RW.  As pt fatigued noted Bil LEs beginning to tremor with fatigue.   Gait Pattern: Step-through pattern;Decreased  stride length Stairs: Yes Stairs Assistance: 4: Min guard Stairs Assistance Details (indicate cue type and reason): cues for stair technique and encouragement.  pt seems nervous, but did well on steps.   Stair Management Technique: One rail Right;Forwards Number of Stairs: 5  Wheelchair Mobility Wheelchair Mobility: No    Exercises     PT Diagnosis:    PT Problem List:   PT Treatment Interventions:     PT Goals Acute Rehab PT Goals PT Goal Formulation: With patient Time For Goal Achievement: 11/09/12 Potential to Achieve Goals: Good Pt will go Supine/Side to Sit: with modified independence;with HOB 0 degrees PT Goal: Supine/Side to Sit - Progress: Met Pt will go Sit to Stand: with modified independence PT Goal: Sit to Stand - Progress: Goal set today Pt will Transfer Bed to Chair/Chair to Bed: with modified independence PT Transfer Goal: Bed to Chair/Chair to Bed - Progress: Goal set today Pt will Ambulate: >150 feet;with modified independence;with rolling walker PT Goal: Ambulate - Progress: Goal set today Pt will Go Up / Down Stairs: Flight;with supervision;with rail(s) PT Goal: Up/Down Stairs - Progress: Goal set today  Visit Information  Last PT Received On: 11/06/12 Assistance Needed: +1    Subjective Data  Subjective: I was worried they would make me stay through Thanksgiving.     Cognition  Overall Cognitive Status: Appears within functional limits for tasks assessed/performed Arousal/Alertness: Awake/alert Orientation Level: Appears intact for tasks assessed Behavior During Session: [facility] for tasks performed    Balance  Balance Balance Assessed: No  End of Session PT - End of Session Equipment Utilized During Treatment: Gait belt Activity Tolerance: Patient tolerated treatment well Patient left: in chair;with call bell/phone within  reach Nurse Communication: Mobility status   GP     Sunny Schlein, [place] [phone] 11/06/2012, 11:26 AM

## 2012-11-06 NOTE — Progress Notes (Signed)
Melysa Schroyer, PTA [phone] 11/06/2012  

## 2012-11-07 ENCOUNTER — Telehealth (INDEPENDENT_AMBULATORY_CARE_PROVIDER_SITE_OTHER): Payer: Self-pay | Admitting: General Surgery

## 2012-11-07 NOTE — Telephone Encounter (Signed)
Dois Davenport from [facility] called letting us know that this pt's discharge instructions say the pt should follow up with [place] in 1 week from yesterday.  I made her an appt w/ Dr. Ezzard Standing.  The D/c instructions also say that this pt should get a CBC done 3 days from her discharge from her PCP.  They explained that this pt doesn't have a PCP and that we are not open on Friday.  She said they would have her go to an urgent clinic to have this done and that they would fax the labs to [place] for newman to take a look at.

## 2012-11-09 ENCOUNTER — Encounter (HOSPITAL_COMMUNITY): Payer: Self-pay

## 2012-11-09 ENCOUNTER — Emergency Department (INDEPENDENT_AMBULATORY_CARE_PROVIDER_SITE_OTHER)
Admission: EM | Admit: 2012-11-09 | Discharge: 2012-11-09 | Disposition: A | Discharge status: Home / Self Care | Payer: Self-pay | Source: Home / Self Care

## 2012-11-09 DIAGNOSIS — L03317 Cellulitis of buttock: Secondary | ICD-10-CM

## 2012-11-09 DIAGNOSIS — I10 Essential (primary) hypertension: Secondary | ICD-10-CM

## 2012-11-09 DIAGNOSIS — E119 Type 2 diabetes mellitus without complications: Secondary | ICD-10-CM

## 2012-11-09 DIAGNOSIS — L0231 Cutaneous abscess of buttock: Secondary | ICD-10-CM

## 2012-11-09 MED ORDER — INSULIN ASPART PROT & ASPART (70-30 MIX) 100 UNIT/ML ~~LOC~~ SUSP: 30.0000 [IU] | Freq: Two times a day (BID) | SUBCUTANEOUS | Status: DC

## 2012-11-09 MED ORDER — INSULIN REGULAR HUMAN 100 UNIT/ML IJ SOLN: 10.0000 [IU] | INTRAMUSCULAR | Status: DC

## 2012-11-09 NOTE — ED Notes (Signed)
Patient states was discharged from hospital 3 days ago.  States her discharge orders are for cbc and cmp  To be done 3 days after discharge.  Results are to be sent to Dr. Ezzard Standing either via phone [phone] or can be faxed to [phone]. Patient is to follow up with Dr, Ezzard Standing next week

## 2012-11-09 NOTE — ED Provider Notes (Signed)
History     CSN: [id number]  Arrival date & time 11/09/12  1117   Chief Complaint  Patient presents with  . Follow-up    discharged 11/25 [place] [facility]   HPI Pt presents to follow up from recent hospitalization.  BS [id number].  Pt taking novolin 70/30 30 units with supper and taking novolin R with meals on a sliding scale.  Pt says she is checking her BS 3 times per day.  Pt says that she has not had any low blood sugars.  The patient was treated for 2 large abscess ease in the perineal area that requires surgical drainage.  The patient is currently packing the wounds.  She is scheduled to followup with the surgeon next week.  The patient reports that she has been changing the dressings at home.  She reports that she has been ambulating without difficulty.  She denies nausea vomiting and diarrhea.  She denies fever and chills.  She is taking antibiotics prescribed when she was discharged from the hospital.    Past Medical History  Diagnosis Date  . Diabetes mellitus   . Hypertension   . HTN (hypertension) 06/04/2012  . Hyperlipidemia 06/04/2012  . Diabetes mellitus 06/04/2012    Past Surgical History  Procedure Date  . Irrigation and debridement abscess 10/31/2012    Procedure: IRRIGATION AND DEBRIDEMENT ABSCESS;  Surgeon: Cherylynn Ridges, MD;  Location: [facility] OR;  Service: General;  Laterality: Bilateral;    No family history on file.  History  Substance Use Topics  . Smoking status: Never Smoker   . Smokeless tobacco: Not on file  . Alcohol Use: No    OB History    Grav Para Term Preterm Abortions TAB SAB Ect Mult Living                 Review of Systems  Constitutional: Negative.   HENT: Negative.   Eyes: Negative.   Respiratory: Negative.   Cardiovascular: Negative.   Gastrointestinal: Negative.   Musculoskeletal: Negative.   Skin:       2 healing perineal abscesses  Neurological: Negative.   Hematological: Negative.   Psychiatric/Behavioral: Negative.      Allergies  Penicillins  Home Medications   Current Outpatient Rx  Name  Route  Sig  Dispense  Refill  . DIPHENHYDRAMINE HCL 25 MG PO CAPS   Oral   Take 1 capsule (25 mg total) by mouth every 6 (six) hours as needed for itching.   30 capsule   0   . INSULIN ASPART PROT & ASPART (70-30) 100 UNIT/ML [place] SUSP   Subcutaneous   Inject 30 Units into the skin daily with supper.   10 mL   1   . INSULIN REGULAR HUMAN 100 UNIT/ML IJ SOLN   Subcutaneous   Inject 0.1-0.25 mLs (10-25 Units total) into the skin 3 (three) times daily before meals. Per sliding scale.   10 mL   1   . LISINOPRIL 10 MG PO TABS   Oral   Take 10 mg by mouth daily.         . SULFAMETHOXAZOLE-TRIMETHOPRIM 400-80 MG PO TABS   Oral   Take 1 tablet by mouth every 12 (twelve) hours.   12 tablet   0   . LABETALOL HCL 200 MG PO TABS   Oral   Take 200 mg by mouth 2 (two) times daily.         . OXYCODONE-ACETAMINOPHEN 5-325 MG PO TABS   Oral  Take 1-2 tablets by mouth every 4 (four) hours as needed.   30 tablet   0     BP 134/92  Pulse 78  Temp 98.8 F (37.1 C) (Oral)  Resp 21  SpO2 100%  LMP 11/01/2012  Physical Exam  Constitutional: She is oriented to person, place, and time. She appears well-developed and well-nourished. No distress.  HENT:  Head: Normocephalic and atraumatic.  Eyes: EOM are normal. Pupils are equal, round, and reactive to light.  Neck: Normal range of motion. Neck supple. No JVD present. No thyromegaly present.  Cardiovascular: Normal rate, regular rhythm and normal heart sounds.   Pulmonary/Chest: Effort normal and breath sounds normal.  Genitourinary:       2 large perineal abscesses evaluated and packed with gauze which was removed to reveal pink healing granulation tissue in both abscess cavities  Musculoskeletal: Normal range of motion. She exhibits no edema.  Neurological: She is alert and oriented to person, place, and time.  Skin: Skin is warm and dry. Rash  noted.       Scaly dry rash on the back of the neck and on the upper extremities  Psychiatric: She has a normal mood and affect. Her behavior is normal. Judgment and thought content normal.    ED Course  Procedures (including critical care time)  Labs Reviewed - No data to display No results found.  No diagnosis found.   MDM  IMPRESSION  Poorly controlled diabetes mellitus, insulin requiring  Followup from recent diabetic ketoacidosis  Status post 2 large perineal abscesses requiring incision and drainage  RECOMMENDATIONS / PLAN  Change novolin 70/30 to 30 units BIDAC (breakfast and supper), use novolin R to treat high blood glucose readings The wounds were repacked today with clean sterile gauze and saline and nonstick pads The patient was given a sliding scale to treat high blood glucose readings with Novolin R The patient was strongly 5 to continue testing blood glucose 5 times per day The patient was given instructions to continue taking the antibiotics as prescribed The patient was advised to return if she is having any difficulty with the wounds healing or with poor glycemic control.  I explained to the patient that her blood glucose readings in the 200s would be too high to promote adequate healing and she verbalized understanding. Hypoglycemia precautions discussed with the patient and she verbalized understanding  FOLLOW UP 2 weeks  The patient was given clear instructions to go to ER or return to medical center if symptoms don't improve, worsen or new problems develop.  The patient verbalized understanding.  The patient was told to call to get lab results if they haven't heard anything in the next week.            Cleora Fleet, MD 11/09/12 [phone]

## 2012-11-16 ENCOUNTER — Encounter (INDEPENDENT_AMBULATORY_CARE_PROVIDER_SITE_OTHER): Payer: Self-pay | Admitting: Surgery

## 2012-11-16 ENCOUNTER — Ambulatory Visit (INDEPENDENT_AMBULATORY_CARE_PROVIDER_SITE_OTHER): Payer: Self-pay | Admitting: Surgery

## 2012-11-16 VITALS — BP 166/108 | HR 76 | Temp 98.5°F | Resp 18 | Ht 64.0 in | Wt 207.4 lb

## 2012-11-16 DIAGNOSIS — L02419 Cutaneous abscess of limb, unspecified: Secondary | ICD-10-CM | POA: Insufficient documentation

## 2012-11-16 NOTE — Progress Notes (Signed)
[facility]  Ovidio Kin, MD,  FACS [address] [place]  [address] [place], [place]    [address] Phone:  [phone] FAX:  [phone]   Re:   Rebecca Rhodes DOB:   Sep 13, 1981 MRN:   [id number]  ASSESSMENT AND PLAN: 1. IRRIGATION AND DEBRIDEMENT ABSCESS of bilateral upper inner thigh abscesses - J. Wyatt - 10/31/2102   Wider debridement - D. Layney Gillson - 11/03/2012.  Bilateral inner thigh abscesses are almost entirely healed.  I made this her last appt with [place].   2. HTN (hypertension)  3. Diabetes mellitus (history of DKA)  4. Morbid obesity. 5.  I provided her information regarding the [facility] Internal Medicine Dept and suggested she call them for follow up medical care.  (phone number is [phone])  HISTORY OF PRESENT ILLNESS: Chief Complaint  Patient presents with  . Routine Post Op    reck i &d buttock abscess   Rebecca Rhodes is a 31 y.o. (DOB: 1981-09-30)  AA female who is a patient of Pcp Not In System and comes to me today for follow up of bilateral thigh abscesses.  She was on the Doc of the Week service at [facility].  She is doing much better.  Her glucose last night  PHYSICAL EXAM: BP 166/108  Pulse 76  Temp 98.5 F (36.9 C) (Temporal)  Resp 18  Ht 5\' 4"  (1.626 m)  Wt 207 lb 6.4 oz (94.076 kg)  BMI 35.60 kg/m2  LMP 11/01/2012  Bilateral inner thighs:  Bilateral incisions, about 8 cm in length.  The right one is almost healed and down to 0.5 cm in width.  The left one is about 1.0 cm in width.  These area looks good and ought to heal over the next 2 to 4 weeks.  The patient feels comfortable changing the dressing and I have made this her last appt.    DATA REVIEWED: Epic chart.  Ovidio Kin, MD, FACS Office:  [phone]

## 2012-11-16 NOTE — Patient Instructions (Signed)
[facility] Internal Medicine Dept phone number is [phone]. Call them to set up an appt.

## 2012-11-23 ENCOUNTER — Encounter (HOSPITAL_COMMUNITY): Payer: Self-pay

## 2012-11-23 ENCOUNTER — Emergency Department (INDEPENDENT_AMBULATORY_CARE_PROVIDER_SITE_OTHER)
Admission: EM | Admit: 2012-11-23 | Discharge: 2012-11-23 | Disposition: A | Discharge status: Home / Self Care | Payer: Self-pay | Source: Home / Self Care

## 2012-11-23 DIAGNOSIS — E119 Type 2 diabetes mellitus without complications: Secondary | ICD-10-CM

## 2012-11-23 MED ORDER — GLUCOSE BLOOD VI STRP: ORAL_STRIP | Status: DC

## 2012-11-23 NOTE — ED Provider Notes (Signed)
History     CSN: [id number]  Arrival date & time 11/23/12  1521   First MD Initiated Contact with Patient 11/23/12 1558      Chief Complaint  Patient presents with  . Follow-up    HPI  31 year old female with uncontrolled diabetes who was recently hospitalized for DKA and left eye abscess. She was recently seen in the clinic for followup and was given prescription for NovoLog and pre-meal regular insulin. She however has not checked her blood look was at home as she has did not have prescription for test strips.  She denies any headache, blurry vision, chest pain, palpitations, shortness of breath, nausea, vomiting, polyuria, Rebecca Rhodes, joint pains, muscle aches, generalized weakness. He recently followed with the surgery post discharge and was informed that her was healing well.  Past Medical History  Diagnosis Date  . Diabetes mellitus   . Hypertension   . HTN (hypertension) 06/04/2012  . Hyperlipidemia 06/04/2012  . Diabetes mellitus 06/04/2012    Past Surgical History  Procedure Date  . Irrigation and debridement abscess 10/31/2012    Procedure: IRRIGATION AND DEBRIDEMENT ABSCESS;  Surgeon: Cherylynn Ridges, MD;  Location: [facility] OR;  Service: General;  Laterality: Bilateral;    No family history on file.  History  Substance Use Topics  . Smoking status: Never Smoker   . Smokeless tobacco: Not on file  . Alcohol Use: No    OB History    Grav Para Term Preterm Abortions TAB SAB Ect Mult Living                  Review of Systems  as outlined in history of presenting illness Allergies  Penicillins  Home Medications   Current Outpatient Rx  Name  Route  Sig  Dispense  Refill  . ATENOLOL 25 MG PO TABS   Oral   Take 25 mg by mouth daily.         Marland Kitchen DIPHENHYDRAMINE HCL 25 MG PO CAPS   Oral   Take 1 capsule (25 mg total) by mouth every 6 (six) hours as needed for itching.   30 capsule   0   . INSULIN ASPART PROT & ASPART (70-30) 100 UNIT/ML [place] SUSP    Subcutaneous   Inject 30 Units into the skin 2 (two) times daily with a meal.   10 mL   1   . INSULIN REGULAR HUMAN 100 UNIT/ML IJ SOLN   Subcutaneous   Inject 0.1-0.25 mLs (10-25 Units total) into the skin as directed. Per sliding scale.   10 mL   1   . LISINOPRIL 10 MG PO TABS   Oral   Take 10 mg by mouth daily.         . OXYCODONE-ACETAMINOPHEN 5-325 MG PO TABS   Oral   Take 1-2 tablets by mouth every 4 (four) hours as needed.   30 tablet   0   . SULFAMETHOXAZOLE-TRIMETHOPRIM 400-80 MG PO TABS   Oral   Take 1 tablet by mouth every 12 (twelve) hours.   12 tablet   0     BP 143/108  Pulse 96  Temp 98.6 F (37 C) (Oral)  Resp 19  SpO2 100%  Physical Exam Middle aged female in no acute distress HEENT: No pallor, moist oral mucosa Chest: Clear to reason bilaterally CVS: Normal S1 and S2 no murmurs rub or gallop Abdomen: Soft, nontender nondistended bowel sounds present Extremities: Dressing over her left upper thigh with healing granulation tissues,  no discharge CNS: AAO times 3 ED Course  Procedures (including critical care time)  Labs Reviewed - No data to display No results found.   1. Diabetes mellitus    Patient informs taking her to NovoLog regularly however has not been able to check her blood glucose at home as she does not have prescription for blood glucose test strips. She denies any symptoms at this time. She has had her follow up with the surgeon recently and her thigh abscess seems to be healing well. Counsel her on taking her insulin regularly and monitoring her blood glucose. On several test strips she is to come back in 2 weeks . The meantime she can ask her to keep a log of her blood sugar monitoring and we will adjust her insulin dose accordingly.   her blood pressure is elevated and patient counseled on taking her medications regularly. We will followup in 2 weeks and increase her blood pressure medication if stills time and  MDM  Follow  up in 2 weeks.         Eddie North, MD 11/23/12 [phone]

## 2012-11-23 NOTE — ED Notes (Signed)
Patient states had follow up at surgeon office last week, now following up in our office.Post surgery to boils bi lateral inner thighs

## 2012-12-07 ENCOUNTER — Emergency Department (INDEPENDENT_AMBULATORY_CARE_PROVIDER_SITE_OTHER)
Admission: EM | Admit: 2012-12-07 | Discharge: 2012-12-07 | Disposition: A | Discharge status: Home / Self Care | Payer: Self-pay | Source: Home / Self Care

## 2012-12-07 ENCOUNTER — Encounter (HOSPITAL_COMMUNITY): Payer: Self-pay

## 2012-12-07 DIAGNOSIS — E119 Type 2 diabetes mellitus without complications: Secondary | ICD-10-CM

## 2012-12-07 DIAGNOSIS — L03317 Cellulitis of buttock: Secondary | ICD-10-CM

## 2012-12-07 DIAGNOSIS — L0231 Cutaneous abscess of buttock: Secondary | ICD-10-CM

## 2012-12-07 DIAGNOSIS — I10 Essential (primary) hypertension: Secondary | ICD-10-CM

## 2012-12-07 MED ORDER — INSULIN REGULAR HUMAN 100 UNIT/ML IJ SOLN: 10.0000 [IU] | INTRAMUSCULAR | Status: DC

## 2012-12-07 MED ORDER — LISINOPRIL 10 MG PO TABS: 10.0000 mg | ORAL_TABLET | Freq: Every day | ORAL | Status: DC

## 2012-12-07 MED ORDER — ATENOLOL 25 MG PO TABS: 25.0000 mg | ORAL_TABLET | Freq: Every day | ORAL | Status: DC

## 2012-12-07 MED ORDER — INSULIN ASPART PROT & ASPART (70-30 MIX) 100 UNIT/ML ~~LOC~~ SUSP: 30.0000 [IU] | Freq: Two times a day (BID) | SUBCUTANEOUS | Status: DC

## 2012-12-07 NOTE — ED Provider Notes (Signed)
History     CSN: [id number]  Arrival date & time 12/07/12  1625   First MD Initiated Contact with Patient 12/07/12 1744      Chief Complaint  Patient presents with  . Follow-up    (Consider location/radiation/quality/duration/timing/severity/associated sxs/prior treatment) HPI  Patient comes for follow up. She has been prescribed NovoLog 70/30 and sliding scale regular insulin, she does with that her sugars have been well controlled. She would like to get refills today for the insulin. Patient also has thigh wounds and says they're healing. Past Medical History  Diagnosis Date  . Diabetes mellitus   . Hypertension   . HTN (hypertension) 06/04/2012  . Hyperlipidemia 06/04/2012  . Diabetes mellitus 06/04/2012    Past Surgical History  Procedure Date  . Irrigation and debridement abscess 10/31/2012    Procedure: IRRIGATION AND DEBRIDEMENT ABSCESS;  Surgeon: Cherylynn Ridges, MD;  Location: [facility] OR;  Service: General;  Laterality: Bilateral;    No family history on file.  History  Substance Use Topics  . Smoking status: Never Smoker   . Smokeless tobacco: Not on file  . Alcohol Use: No    OB History    Grav Para Term Preterm Abortions TAB SAB Ect Mult Living                  Review of Systems Denies fever Denies nausea vomiting and diarrhea Chest chest pain shortness of breath Allergies  Penicillins  Home Medications   Current Outpatient Rx  Name  Route  Sig  Dispense  Refill  . ATENOLOL 25 MG PO TABS   Oral   Take 1 tablet (25 mg total) by mouth daily.   30 tablet   2   . DIPHENHYDRAMINE HCL 25 MG PO CAPS   Oral   Take 1 capsule (25 mg total) by mouth every 6 (six) hours as needed for itching.   30 capsule   0   . GLUCOSE BLOOD VI STRP      Use as instructed   100 each   12   . INSULIN ASPART PROT & ASPART (70-30) 100 UNIT/ML [place] SUSP   Subcutaneous   Inject 30 Units into the skin 2 (two) times daily with a meal.   10 mL   1   . INSULIN REGULAR  HUMAN 100 UNIT/ML IJ SOLN   Subcutaneous   Inject 0.1-0.25 mLs (10-25 Units total) into the skin as directed. Per sliding scale.   10 mL   1   . LISINOPRIL 10 MG PO TABS   Oral   Take 1 tablet (10 mg total) by mouth daily.   30 tablet   3   . OXYCODONE-ACETAMINOPHEN 5-325 MG PO TABS   Oral   Take 1-2 tablets by mouth every 4 (four) hours as needed.   30 tablet   0   . SULFAMETHOXAZOLE-TRIMETHOPRIM 400-80 MG PO TABS   Oral   Take 1 tablet by mouth every 12 (twelve) hours.   12 tablet   0     BP 147/100  Pulse 90  Temp 97.9 F (36.6 C) (Oral)  Resp 18  SpO2 100%  Physical Exam Constitutional:   Patient is a well-developed and well-nourished femalr in no acute distress and cooperative with exam. Head: Normocephalic and atraumatic Mouth: Mucus membranes moist Eyes: PERRL, EOMI, conjunctivae normal Cardiovascular: RRR, S1 normal, S2 normal Pulmonary/Chest: CTAB, no wheezes, rales, or rhonchi Groin: Right thigh wound is closed, left thigh  5 cm gaping wound  noted   ED Course  Procedures (including critical care time)  Labs Reviewed - No data to display No results found.   1. HTN (hypertension)   2. Diabetes mellitus    Hypertension Patient's blood pressure is elevated today Will give the refills for atenolol and lisinopril  Diabetes mellitus Patient will continue taking NovoLog sliding-scale and NovoLog 70/30, 30 units subcutaneously  twice a day  Thigh wound Healing Recommend to follow up with the surgeon as outpatient  Patient will follow up with [place] in one month  MDM          Meredeth Ide, MD 12/07/12 1806

## 2012-12-07 NOTE — ED Notes (Signed)
Patient states here to follow up after her surgery . Had two cysts removed from inner thighs, one on each thigh

## 2012-12-14 LAB — AFB CULTURE WITH SMEAR (NOT AT ARMC): Acid Fast Smear: NONE SEEN

## 2013-01-12 ENCOUNTER — Encounter (HOSPITAL_COMMUNITY): Payer: Self-pay

## 2013-01-12 ENCOUNTER — Inpatient Hospital Stay (HOSPITAL_COMMUNITY)
Admission: AD | Admit: 2013-01-12 | Discharge: 2013-01-12 | Disposition: A | Discharge status: Home / Self Care | Payer: Medicaid Other | Source: Ambulatory Visit | Attending: Obstetrics & Gynecology | Admitting: Obstetrics & Gynecology

## 2013-01-12 DIAGNOSIS — N912 Amenorrhea, unspecified: Secondary | ICD-10-CM

## 2013-01-12 DIAGNOSIS — O24919 Unspecified diabetes mellitus in pregnancy, unspecified trimester: Secondary | ICD-10-CM | POA: Insufficient documentation

## 2013-01-12 DIAGNOSIS — O10019 Pre-existing essential hypertension complicating pregnancy, unspecified trimester: Secondary | ICD-10-CM | POA: Insufficient documentation

## 2013-01-12 LAB — URINALYSIS, ROUTINE W REFLEX MICROSCOPIC
Hgb urine dipstick: NEGATIVE
Leukocytes, UA: NEGATIVE
Protein, ur: NEGATIVE mg/dL
Specific Gravity, Urine: >1.03 — ABNORMAL HIGH (ref 1.005–1.030)
Urobilinogen, UA: 0.2 mg/dL (ref 0.0–1.0)

## 2013-01-12 LAB — POCT PREGNANCY, URINE: Preg Test, Ur: POSITIVE — AB

## 2013-01-12 MED ORDER — PROMETHAZINE HCL 12.5 MG PO TABS: 12.5000 mg | ORAL_TABLET | Freq: Four times a day (QID) | ORAL | Status: DC | PRN

## 2013-01-12 MED ORDER — ONDANSETRON HCL 4 MG PO TABS: 4.0000 mg | ORAL_TABLET | Freq: Four times a day (QID) | ORAL | Status: DC

## 2013-01-12 MED ORDER — LABETALOL HCL 100 MG PO TABS: 100.0000 mg | ORAL_TABLET | Freq: Two times a day (BID) | ORAL | Status: DC

## 2013-01-12 NOTE — MAU Provider Note (Signed)
History     CSN: [id number]  Arrival date and time: 01/12/13 1812   None     Chief Complaint  Patient presents with  . Possible Pregnancy   HPI Ms. Rebecca Rhodes is a 32 y.o. G1P0 at [redacted]w[redacted]d who presents to MAU for pregnancy verification. The patient has DM and is CHTN. She stopped taking her BP meds 2 days ago because she read that it was not ok in pregnancy. The patient has slightly elevated BP today. She denies headache, blurred vision or changes in vision, peripheral edema or RUQ pain. She also denies abdominal pain, vaginal bleeding, abnormal discharge or fever. She has had some very mild intermittent cramping earlier today, but this pain resolved itself.  She has also had some nausea without vomiting.   OB History    Grav Para Term Preterm Abortions TAB SAB Ect Mult Living   1               Past Medical History  Diagnosis Date  . Diabetes mellitus   . Hypertension   . HTN (hypertension) 06/04/2012  . Hyperlipidemia 06/04/2012  . Diabetes mellitus 06/04/2012    Past Surgical History  Procedure Date  . Irrigation and debridement abscess 10/31/2012    Procedure: IRRIGATION AND DEBRIDEMENT ABSCESS;  Surgeon: Cherylynn Ridges, MD;  Location: [facility] OR;  Service: General;  Laterality: Bilateral;    No family history on file.  History  Substance Use Topics  . Smoking status: Never Smoker   . Smokeless tobacco: Not on file  . Alcohol Use: No    Allergies:  Allergies  Allergen Reactions  . Penicillins Rash    Prescriptions prior to admission  Medication Sig Dispense Refill  . diphenhydrAMINE (BENADRYL) 25 mg capsule Take 1 capsule (25 mg total) by mouth every 6 (six) hours as needed for itching.  30 capsule  0  . glucose blood (ACCU-CHEK INSTANT GLUCOSE TEST) test strip Use as instructed  100 each  12  . insulin aspart protamine-insulin aspart (NOVOLOG 70/30) (70-30) 100 UNIT/ML injection Inject 30 Units into the skin 2 (two) times daily with a meal.  10 mL  1  . insulin  regular (NOVOLIN R,HUMULIN R) 100 units/mL injection Inject 0.1-0.25 mLs (10-25 Units total) into the skin as directed. Per sliding scale.  10 mL  1  . lisinopril (PRINIVIL,ZESTRIL) 10 MG tablet Take 1 tablet (10 mg total) by mouth daily.  30 tablet  3  . oxyCODONE-acetaminophen (PERCOCET/ROXICET) 5-325 MG per tablet Take 1-2 tablets by mouth every 4 (four) hours as needed.  30 tablet  0  . sulfamethoxazole-trimethoprim (BACTRIM,SEPTRA) 400-80 MG per tablet Take 1 tablet by mouth every 12 (twelve) hours.  12 tablet  0  . [DISCONTINUED] atenolol (TENORMIN) 25 MG tablet Take 1 tablet (25 mg total) by mouth daily.  30 tablet  2    ROS All negative unless otherwise noted in HPI Physical Exam   Blood pressure 162/99, pulse 87, temperature 98.9 F (37.2 C), temperature source Oral, resp. rate 16, height 5\' 4"  (1.626 m), weight 221 lb 12.8 oz (100.608 kg), last menstrual period 12/01/2012.  Physical Exam  Constitutional: She is oriented to person, place, and time. She appears well-developed and well-nourished. No distress.  HENT:  Head: Normocephalic.  Cardiovascular: Normal rate.   Respiratory: Effort normal.  Neurological: She is alert and oriented to person, place, and time.  Skin: Skin is warm and dry. No erythema.  Psychiatric: She has a normal mood and affect.  Results for orders placed during the hospital encounter of 01/12/13 (from the past 24 hour(s))  URINALYSIS, ROUTINE W REFLEX MICROSCOPIC     Status: Abnormal   Collection Time   01/12/13  6:15 PM      Component Value Range   Color, Urine YELLOW  YELLOW   APPearance HAZY (*) CLEAR   Specific Gravity, Urine >1.030 (*) 1.005 - 1.030   pH 5.5  5.0 - 8.0   Glucose, UA 100 (*) NEGATIVE mg/dL   Hgb urine dipstick NEGATIVE  NEGATIVE   Bilirubin Urine NEGATIVE  NEGATIVE   Ketones, ur NEGATIVE  NEGATIVE mg/dL   Protein, ur NEGATIVE  NEGATIVE mg/dL   Urobilinogen, UA 0.2  0.0 - 1.0 mg/dL   Nitrite NEGATIVE  NEGATIVE   Leukocytes, UA  NEGATIVE  NEGATIVE  POCT PREGNANCY, URINE     Status: Abnormal   Collection Time   01/12/13  6:23 PM      Component Value Range   Preg Test, Ur POSITIVE (*) NEGATIVE    MAU Course  Procedures None  MDM Discussed patient with Dr. Debroah Loop. Since patient is not having complaints today and is known HTN and will be referred to [facility] for prenatal care, we will send Rx for Labetalol to pharmacy for patient to start now and follow-up in clinic.    Assessment and Plan  A: Positive pregnancy test CHTN DM  P: Discharge home Rx for labetalol, phenergan and zofran sent to patient's pharmacy Referral to [facility] clinic sent. They will call patient with an appointment to start prenatal care Pregnancy confirmation letter given Symptoms of hypertension discussed. Patient to return to MAU, for re-evaluation if she develops these symptoms Bleeding precautions discussed.  Patient instructed to return to MAU as needed or if she develops HTN symptoms, vaginal bleeding, abdominal pain, worsening of N/V or fever.   Freddi Starr, PA-C 01/12/2013, 6:43 PM

## 2013-01-12 NOTE — MAU Note (Signed)
Pt is here for a pregnancy verification letter.

## 2013-01-12 NOTE — MAU Note (Signed)
Needs pregnancy verification letter, stopped taking BP meds two days ago because it said not to take in pregnancy.

## 2013-02-04 ENCOUNTER — Other Ambulatory Visit: Payer: Self-pay | Admitting: Obstetrics & Gynecology

## 2013-02-04 ENCOUNTER — Ambulatory Visit (INDEPENDENT_AMBULATORY_CARE_PROVIDER_SITE_OTHER): Payer: Medicaid Other | Admitting: Obstetrics & Gynecology

## 2013-02-04 ENCOUNTER — Encounter: Payer: Self-pay | Admitting: Obstetrics & Gynecology

## 2013-02-04 ENCOUNTER — Encounter: Payer: Medicaid Other | Attending: Obstetrics & Gynecology | Admitting: Dietician

## 2013-02-04 ENCOUNTER — Other Ambulatory Visit (HOSPITAL_COMMUNITY)
Admission: RE | Admit: 2013-02-04 | Discharge: 2013-02-04 | Disposition: A | Discharge status: Home / Self Care | Payer: Medicaid Other | Source: Ambulatory Visit | Attending: Obstetrics & Gynecology | Admitting: Obstetrics & Gynecology

## 2013-02-04 VITALS — BP 144/104 | Temp 98.0°F | Wt 224.8 lb

## 2013-02-04 DIAGNOSIS — O0990 Supervision of high risk pregnancy, unspecified, unspecified trimester: Secondary | ICD-10-CM | POA: Insufficient documentation

## 2013-02-04 DIAGNOSIS — Z1151 Encounter for screening for human papillomavirus (HPV): Secondary | ICD-10-CM | POA: Insufficient documentation

## 2013-02-04 DIAGNOSIS — O10019 Pre-existing essential hypertension complicating pregnancy, unspecified trimester: Secondary | ICD-10-CM

## 2013-02-04 DIAGNOSIS — Z01419 Encounter for gynecological examination (general) (routine) without abnormal findings: Secondary | ICD-10-CM | POA: Insufficient documentation

## 2013-02-04 DIAGNOSIS — O99810 Abnormal glucose complicating pregnancy: Secondary | ICD-10-CM | POA: Insufficient documentation

## 2013-02-04 DIAGNOSIS — Z713 Dietary counseling and surveillance: Secondary | ICD-10-CM | POA: Insufficient documentation

## 2013-02-04 LAB — TSH: TSH: 0.633 u[IU]/mL (ref 0.350–4.500)

## 2013-02-04 LAB — POCT URINALYSIS DIP (DEVICE)
Bilirubin Urine: NEGATIVE
Glucose, UA: 100 mg/dL — AB
Ketones, ur: NEGATIVE mg/dL
Specific Gravity, Urine: 1.02 (ref 1.005–1.030)
Urobilinogen, UA: 0.2 mg/dL (ref 0.0–1.0)

## 2013-02-04 LAB — HIV ANTIBODY (ROUTINE TESTING W REFLEX): HIV: NONREACTIVE

## 2013-02-04 LAB — HEMOGLOBIN A1C: Mean Plasma Glucose: 169 mg/dL — ABNORMAL HIGH (ref <117)

## 2013-02-04 LAB — COMPREHENSIVE METABOLIC PANEL
Albumin: 3.6 g/dL (ref 3.5–5.2)
Alkaline Phosphatase: 42 U/L (ref 39–117)
BUN: 12 mg/dL (ref 6–23)
Creat: 0.84 mg/dL (ref 0.50–1.10)
Glucose, Bld: 77 mg/dL (ref 70–99)
Total Bilirubin: 0.3 mg/dL (ref 0.3–1.2)

## 2013-02-04 MED ORDER — LABETALOL HCL 100 MG PO TABS: 200.0000 mg | ORAL_TABLET | Freq: Two times a day (BID) | ORAL | Status: DC

## 2013-02-04 MED ORDER — INSULIN REGULAR HUMAN 100 UNIT/ML IJ SOLN: 11.0000 [IU] | Freq: Every day | INTRAMUSCULAR | Status: DC

## 2013-02-04 MED ORDER — INSULIN NPH (HUMAN) (ISOPHANE) 100 UNIT/ML ~~LOC~~ SUSP: 44.0000 [IU] | Freq: Every day | SUBCUTANEOUS | Status: DC

## 2013-02-04 MED ORDER — INSULIN REGULAR HUMAN 100 UNIT/ML IJ SOLN: 22.0000 [IU] | Freq: Every day | INTRAMUSCULAR | Status: DC

## 2013-02-04 MED ORDER — INSULIN SYRINGES (DISPOSABLE) U-100 1 ML MISC: 1.0000 | Freq: Four times a day (QID) | Status: DC

## 2013-02-04 MED ORDER — INSULIN NPH (HUMAN) (ISOPHANE) 100 UNIT/ML ~~LOC~~ SUSP: 11.0000 [IU] | Freq: Every day | SUBCUTANEOUS | Status: DC

## 2013-02-04 NOTE — Patient Instructions (Addendum)
24-Hour Urine Collection HOME CARE  When you get up in the morning on the day you do this test, pee (urinate) in the toilet and flush. Make a note of the time. This will be your start time on the day of collection and the end time on the next morning.  From then on, save all your pee (urine) in the plastic jug that was given to you.  You should stop collecting your pee 24 hours after you started.  If the plastic jug that is given to you already has liquid in it, that is okay. Do not throw out the liquid or rinse out the jug. Some tests need the liquid to be added to your pee.  Keep your plastic jug cool (in an ice chest or the refrigerator) during the test.  When the 24 hours is over, bring your plastic jug to the clinic lab. Keep the jug cool (in an ice chest) while you are bringing it to the lab. Document Released: 02/24/2009 Document Revised: 02/20/2012 Document Reviewed: 02/24/2009 [facility] Patient Information 2013 [place], [place].

## 2013-02-04 NOTE — Progress Notes (Signed)
First Screen scheduled with MFM on March 18th at 11 am.

## 2013-02-04 NOTE — Progress Notes (Signed)
Informal [place] for viability- +cardiac, +FM, including limb and gross body movements.

## 2013-02-04 NOTE — Progress Notes (Signed)
   Subjective:    Rebecca Rhodes is a G1P0 [redacted]w[redacted]d being seen today for her first obstetrical visit.  Her obstetrical history is significant for obesity. Patient does intend to breast feed. Pregnancy history fully reviewed.  Pt has history of CHTN on lisinopril and labetalol.  Pt is also Type 2 DM on insulin.  Pt was in DKA in 2013 with intubation.  Pt might be a type 1/2.   Pt's BP is high.  She only has been taking her labetalol at night.  Pt did not stop her lisonpril after her visit to MAU 1 week ago.  Patient reports no complaints.  Filed Vitals:   02/04/13 0810 02/04/13 0819  BP: 157/107 144/104  Temp: 98 F (36.7 C)   Weight: 224 lb 12.8 oz (101.969 kg)     HISTORY: OB History   Grav Para Term Preterm Abortions TAB SAB Ect Mult Living   1              # Outc Date GA Lbr Len/2nd Wgt Sex Del Anes PTL Lv   1 CUR              Past Medical History  Diagnosis Date  . Hypertension   . HTN (hypertension) 06/04/2012  . Hyperlipidemia 06/04/2012  . Diabetes mellitus   . Diabetes mellitus 06/04/2012   Past Surgical History  Procedure Laterality Date  . Irrigation and debridement abscess  10/31/2012    Procedure: IRRIGATION AND DEBRIDEMENT ABSCESS;  Surgeon: Cherylynn Ridges, MD;  Location: [facility] OR;  Service: General;  Laterality: Bilateral;   Family History  Problem Relation Age of Onset  . Hypertension Mother   . Stroke Mother   . Heart disease Father   . Cancer Maternal Grandfather      Exam    Uterus:     Pelvic Exam:    Perineum: No Hemorrhoids   Vulva: normal   Vagina:  normal mucosa   pH: n/a   Cervix: nulliparous appearance   Adnexa: normal adnexa   Bony Pelvis: average  System: Breast:  normal appearance, no masses or tenderness   Skin: normal coloration and turgor, no rashes, +hirsuitism    Neurologic: oriented, normal mood   Extremities: normal strength, tone, and muscle mass   HEENT sclera clear, anicteric   Mouth/Teeth mucous membranes moist, pharynx normal  without lesions   Neck supple and no masses   Cardiovascular: regular rate and rhythm   Respiratory:  chest clear, no wheezing, crepitations, rhonchi, normal symmetric air entry   Abdomen: soft, nontender, uterus more 12-14 weeks in size   Urinary: urethral meatus normal      Assessment:    Pregnancy: G1P0 Patient Active Problem List  Diagnosis  . DKA (diabetic ketoacidoses)  . Leukocytosis  . Dehydration  . HTN (hypertension)  . Hyperlipidemia  . Diabetes mellitus  . Abdominal pain  . Hypokalemia  . Abscess of buttock, left  . Acute respiratory failure  . Thigh abscess        Plan:     Initial labs drawn. Prenatal vitamins. Problem list reviewed and updated. Genetic Screening discussed First Screen: ordered.  Ultrasound discussed; fetal survey: requested. (will conduct at 18-20 weeks)  Follow up in 1 weeks. Hgb A1c, TSH, OB panel, 24 hour urine,  Needs dating [place] because size does not match dates.  If pt further along, then first screen will need to be moved up. Rebecca Rhodes H. 02/04/2013

## 2013-02-04 NOTE — Progress Notes (Signed)
Nutrition note: 1st visit consult Pt has h/o type 2 DM, HTN & obesity. Pt has gained 3.8# @ [redacted]w[redacted]d, which is slightly > expected. Pt reports eating 2 meals & 2 snacks/d. Pt reports drinking ~1 liter of coke/d & ~1 cup of sweet tea/d. Pt is taking PNV. Pt reports occ nausea but no vomiting or heartburn. Pt reports only checking BS 1x/d & it is ~200 right after breakfast. When asked what foods affect her BS, pt stated sugary foods & fried foods. Pt received verbal & written education on diabetes during pregnancy. Encouraged decreasing coke & tea to 0-1 c/d. Disc tips to decrease nausea. Pt is to check BS 4x/d. Disc wt gain goals of 11-20# or 0.5#/wk once in 2nd trimester. Pt agrees to check BS 4x/d & decrease soda intake. Pt does not have WIC but plans to apply. Pt plans to BF. F/u in 2-4 wks Blondell Reveal, MS, RD, LDN

## 2013-02-04 NOTE — Progress Notes (Signed)
Pulse: 81 Pt was not aware that she was supposed to take labetalol in morning. Has only been taking once daily at night.  Needs to see Nutrition, SW and [place] Grater

## 2013-02-04 NOTE — Progress Notes (Signed)
Diabetes Education:  Seen today for meter.  Currently applying for pregnancy medicaid which has not been approved.  She currently is using a Wave Sense meter and is having problems affording the strips and her insulin.  Provided her with a True Track Meter to use until her Medicaid is approved.  RUE:[id number]  Exp: 2014/11/10 1 box strips Lot: [id number]  Exp: 2014/12/26 , one box lancets Lot: [id number]  Exp: 2017/08/30.  On return demonstration, glucose was 75 mg today at 10:00 and following her 70/30 insulin this AM and no breakfast.  Provided snack of graham crackers and PB (approax 25 gm total).  Instructed to monitor glucose fasting and 2 hours after the first bite of each meal.  Has new insulin orders.  Has never mixed insulins before.  Provided an insulin start kit and reviewed the procedure for mixing insulins.  She will be able to buy the NPH and Regular insulin on Thursday, and I ask that she start the regimen on Friday evening with the first dose of the Regular Insulin 11 units.before dinner.  Insulin prescription:  In AM: NPH= 44 units with Regular 22 units, Before Dinner: Regular 11 units, At Bedtime NPH: 11 units.  Provided written reminder of insulin prescriptions. Currently is working at [employer] on the second shift.  Instructed to bring meter and glucose meter to all clinic appointments.  Maggie Brianca Fortenberry, RN, RD, CDE

## 2013-02-05 LAB — OBSTETRIC PANEL
Antibody Screen: NEGATIVE
Basophils Absolute: 0 10*3/uL (ref 0.0–0.1)
Basophils Relative: 0 % (ref 0–1)
Eosinophils Relative: 1 % (ref 0–5)
HCT: 38.3 % (ref 36.0–46.0)
Lymphocytes Relative: 25 % (ref 12–46)
MCHC: 33.7 g/dL (ref 30.0–36.0)
Monocytes Absolute: 0.7 10*3/uL (ref 0.1–1.0)
Neutro Abs: 6.2 10*3/uL (ref 1.7–7.7)
Platelets: 302 10*3/uL (ref 150–400)
RDW: 13.8 % (ref 11.5–15.5)
WBC: 9.4 10*3/uL (ref 4.0–10.5)

## 2013-02-06 ENCOUNTER — Telehealth: Payer: Self-pay | Admitting: *Deleted

## 2013-02-06 LAB — HEMOGLOBINOPATHY EVALUATION
Hgb A2 Quant: 2.5 % (ref 2.2–3.2)
Hgb F Quant: 0 % (ref 0.0–2.0)
Hgb S Quant: 0 %

## 2013-02-06 NOTE — Telephone Encounter (Signed)
Called pt after consult with Dr. Penne Lash. I left message on pt's personal voice mail that her appt for [place] @ MFM has been changed to 02/13/13 @ 1100. This change is because we believe her pregnancy is farther along than her LMP would indicate. It is important that she have the [place] at the appropriate gestation, therefore, the change was necessary. She may call back if she has questions.

## 2013-02-06 NOTE — Telephone Encounter (Signed)
Message copied by Jill Side on Wed Feb 06, 2013 12:04 PM ------      Message from: Lesly Dukes      Created: Mon Feb 04, 2013  4:47 PM       I ordered a dating [place].  Size is greater than dates.  Pt should have this done at MFM so if she is 11-13 weeks 6 days the NT can be done.            Thx ------

## 2013-02-07 LAB — CREATININE CLEARANCE, URINE, 24 HOUR
Creatinine Clearance: 125 mL/min — ABNORMAL HIGH (ref 75–115)
Creatinine, 24H Ur: 1509 mg/d (ref 700–1800)
Creatinine: 0.84 mg/dL (ref 0.50–1.10)

## 2013-02-07 LAB — PROTEIN, URINE, 24 HOUR: Protein, Urine: 5 mg/dL

## 2013-02-11 ENCOUNTER — Encounter: Payer: Self-pay | Admitting: Advanced Practice Midwife

## 2013-02-11 ENCOUNTER — Other Ambulatory Visit: Payer: Self-pay | Admitting: Obstetrics & Gynecology

## 2013-02-13 ENCOUNTER — Ambulatory Visit (HOSPITAL_COMMUNITY): Admission: RE | Admit: 2013-02-13 | Payer: Medicaid Other | Source: Ambulatory Visit

## 2013-02-13 ENCOUNTER — Other Ambulatory Visit: Payer: Self-pay | Admitting: Obstetrics & Gynecology

## 2013-02-13 ENCOUNTER — Ambulatory Visit (HOSPITAL_COMMUNITY)
Admission: RE | Admit: 2013-02-13 | Discharge: 2013-02-13 | Disposition: A | Discharge status: Home / Self Care | Payer: Medicaid Other | Source: Ambulatory Visit | Attending: Obstetrics & Gynecology | Admitting: Obstetrics & Gynecology

## 2013-02-13 ENCOUNTER — Inpatient Hospital Stay (HOSPITAL_COMMUNITY)
Admission: AD | Admit: 2013-02-13 | Discharge: 2013-02-13 | Disposition: A | Discharge status: Home / Self Care | Payer: Self-pay | Source: Ambulatory Visit | Attending: Obstetrics & Gynecology | Admitting: Obstetrics & Gynecology

## 2013-02-13 VITALS — BP 153/110 | HR 77 | Wt 230.0 lb

## 2013-02-13 DIAGNOSIS — O3510X0 Maternal care for (suspected) chromosomal abnormality in fetus, unspecified, not applicable or unspecified: Secondary | ICD-10-CM | POA: Insufficient documentation

## 2013-02-13 DIAGNOSIS — O99810 Abnormal glucose complicating pregnancy: Secondary | ICD-10-CM | POA: Insufficient documentation

## 2013-02-13 DIAGNOSIS — E119 Type 2 diabetes mellitus without complications: Secondary | ICD-10-CM

## 2013-02-13 DIAGNOSIS — Z3689 Encounter for other specified antenatal screening: Secondary | ICD-10-CM | POA: Insufficient documentation

## 2013-02-13 DIAGNOSIS — I10 Essential (primary) hypertension: Secondary | ICD-10-CM

## 2013-02-13 DIAGNOSIS — O10019 Pre-existing essential hypertension complicating pregnancy, unspecified trimester: Secondary | ICD-10-CM | POA: Insufficient documentation

## 2013-02-13 DIAGNOSIS — O0991 Supervision of high risk pregnancy, unspecified, first trimester: Secondary | ICD-10-CM

## 2013-02-13 MED ORDER — SULFAMETHOXAZOLE-TRIMETHOPRIM 800-160 MG PO TABS: 1.0000 | ORAL_TABLET | Freq: Two times a day (BID) | ORAL | Status: DC

## 2013-02-13 NOTE — Progress Notes (Signed)
Called patient , no answer- left message stating "we are calling to let you know from your most recent urine culture you have a urinary tract infection and an antibiotic to treat this has been called in to your [place] pharmacy, should you have any questions about your medication or UTI to please give [place] a call back at the clinics."

## 2013-02-15 ENCOUNTER — Encounter: Payer: Self-pay | Admitting: Obstetrics & Gynecology

## 2013-02-18 ENCOUNTER — Ambulatory Visit (INDEPENDENT_AMBULATORY_CARE_PROVIDER_SITE_OTHER): Payer: Medicaid Other | Admitting: Family Medicine

## 2013-02-18 ENCOUNTER — Encounter: Payer: Medicaid Other | Attending: Obstetrics & Gynecology | Admitting: Dietician

## 2013-02-18 ENCOUNTER — Encounter: Payer: Self-pay | Admitting: Family Medicine

## 2013-02-18 VITALS — BP 148/100 | Temp 97.3°F | Wt 222.0 lb

## 2013-02-18 DIAGNOSIS — O24919 Unspecified diabetes mellitus in pregnancy, unspecified trimester: Secondary | ICD-10-CM | POA: Insufficient documentation

## 2013-02-18 DIAGNOSIS — O24911 Unspecified diabetes mellitus in pregnancy, first trimester: Secondary | ICD-10-CM

## 2013-02-18 DIAGNOSIS — O10019 Pre-existing essential hypertension complicating pregnancy, unspecified trimester: Secondary | ICD-10-CM | POA: Insufficient documentation

## 2013-02-18 DIAGNOSIS — Z713 Dietary counseling and surveillance: Secondary | ICD-10-CM | POA: Insufficient documentation

## 2013-02-18 DIAGNOSIS — O99810 Abnormal glucose complicating pregnancy: Secondary | ICD-10-CM | POA: Insufficient documentation

## 2013-02-18 LAB — POCT URINALYSIS DIP (DEVICE)
Bilirubin Urine: NEGATIVE
Glucose, UA: 100 mg/dL — AB
Hgb urine dipstick: NEGATIVE
Ketones, ur: NEGATIVE mg/dL
Nitrite: NEGATIVE

## 2013-02-18 MED ORDER — INSULIN NPH (HUMAN) (ISOPHANE) 100 UNIT/ML ~~LOC~~ SUSP: 48.0000 [IU] | Freq: Every day | SUBCUTANEOUS | Status: DC

## 2013-02-18 MED ORDER — INSULIN REGULAR HUMAN 100 UNIT/ML IJ SOLN: 26.0000 [IU] | Freq: Every day | INTRAMUSCULAR | Status: DC

## 2013-02-18 NOTE — Progress Notes (Signed)
Pulse 80 2nd BP: 143/99

## 2013-02-18 NOTE — Progress Notes (Signed)
Diabetes Education:  Seen today for uncontrolled blood glucose.  Has True Track meter, but not consistently monitoring.  Working either 1st or 2 sd shift at [employer] and is not consistent with including her monitoring.  During the last 2 weeks, she has had issues with food security issues.  Does not have WIC and is using it.  Reviewed the need to have something to eat in the morning.  She describes a daily routine of skipping breakfast and then eating 1-2 meals later in the day.  Insulin doses increased.  Reviewed the types of foods and tried to simplify the diet for her.  Will need to see her back next week.  Her medicaid is still pending.  Agree the Novolog would probably be better for her, she can't afford it now.  Maggie May, RN, RD, CDE.

## 2013-02-18 NOTE — Progress Notes (Signed)
Brings book today  FBS 59-112 2 hr pp 160-327  Many of these are pre-prandial.  Will attempt to improve with insulin. Increase Insulin in am. Discussed with [facility], CDE.

## 2013-02-18 NOTE — Patient Instructions (Signed)
Pregnancy - First Trimester During sexual intercourse, millions of sperm go into the vagina. Only 1 sperm will penetrate and fertilize the female egg while it is in the Fallopian tube. One week later, the fertilized egg implants into the wall of the uterus. An embryo begins to develop into a baby. At 6 to 8 weeks, the eyes and face are formed and the heartbeat can be seen on ultrasound. At the end of 12 weeks (first trimester), all the baby's organs are formed. Now that you are pregnant, you will want to do everything you can to have a healthy baby. Two of the most important things are to get good prenatal care and follow your caregiver's instructions. Prenatal care is all the medical care you receive before the baby's birth. It is given to prevent, find, and treat problems during the pregnancy and childbirth. PRENATAL EXAMS  During prenatal visits, your weight, blood pressure and urine are checked. This is done to make sure you are healthy and progressing normally during the pregnancy.  A pregnant woman should gain 25 to 35 pounds during the pregnancy. However, if you are over weight or underweight, your caregiver will advise you regarding your weight.  Your caregiver will ask and answer questions for you.  Blood work, cervical cultures, other necessary tests and a Pap test are done during your prenatal exams. These tests are done to check on your health and the probable health of your baby. Tests are strongly recommended and done for HIV with your permission. This is the virus that causes AIDS. These tests are done because medications can be given to help prevent your baby from being born with this infection should you have been infected without knowing it. Blood work is also used to find out your blood type, previous infections and follow your blood levels (hemoglobin).  Low hemoglobin (anemia) is common during pregnancy. Iron and vitamins are given to help prevent this. Later in the pregnancy,  blood tests for diabetes will be done along with any other tests if any problems develop. You may need tests to make sure you and the baby are doing well.  You may need other tests to make sure you and the baby are doing well. CHANGES DURING THE FIRST TRIMESTER (THE FIRST 3 MONTHS OF PREGNANCY) Your body goes through many changes during pregnancy. They vary from person to person. Talk to your caregiver about changes you notice and are concerned about. Changes can include:  Your menstrual period stops.  The egg and sperm carry the genes that determine what you look like. Genes from you and your partner are forming a baby. The female genes determine whether the baby is a boy or a girl.  Your body increases in girth and you may feel bloated.  Feeling sick to your stomach (nauseous) and throwing up (vomiting). If the vomiting is uncontrollable, call your caregiver.  Your breasts will begin to enlarge and become tender.  Your nipples may stick out more and become darker.  The need to urinate more. Painful urination may mean you have a bladder infection.  Tiring easily.  Loss of appetite.  Cravings for certain kinds of food.  At first, you may gain or lose a couple of pounds.  You may have changes in your emotions from day to day (excited to be pregnant or concerned something may go wrong with the pregnancy and baby).  You may have more vivid and strange dreams. HOME CARE INSTRUCTIONS   It is very important  to avoid all smoking, alcohol and un-prescribed drugs during your pregnancy. These affect the formation and growth of the baby. Avoid chemicals while pregnant to ensure the delivery of a healthy infant.  Start your prenatal visits by the 12th week of pregnancy. They are usually scheduled monthly at first, then more often in the last 2 months before delivery. Keep your caregiver's appointments. Follow your caregiver's instructions regarding medication use, blood and lab tests, exercise,  and diet.  During pregnancy, you are providing food for you and your baby. Eat regular, well-balanced meals. Choose foods such as meat, fish, milk and other low fat dairy products, vegetables, fruits, and whole-grain breads and cereals. Your caregiver will tell you of the ideal weight gain.  You can help morning sickness by keeping soda crackers at the bedside. Eat a couple before arising in the morning. You may want to use the crackers without salt on them.  Eating 4 to 5 small meals rather than 3 large meals a day also may help the nausea and vomiting.  Drinking liquids between meals instead of during meals also seems to help nausea and vomiting.  A physical sexual relationship may be continued throughout pregnancy if there are no other problems. Problems may be early (premature) leaking of amniotic fluid from the membranes, vaginal bleeding, or belly (abdominal) pain.  Exercise regularly if there are no restrictions. Check with your caregiver or physical therapist if you are unsure of the safety of some of your exercises. Greater weight gain will occur in the last 2 trimesters of pregnancy. Exercising will help:  Control your weight.  Keep you in shape.  Prepare you for labor and delivery.  Help you lose your pregnancy weight after you deliver your baby.  Wear a good support or jogging bra for breast tenderness during pregnancy. This may help if worn during sleep too.  Ask when prenatal classes are available. Begin classes when they are offered.  Do not use hot tubs, steam rooms or saunas.  Wear your seat belt when driving. This protects you and your baby if you are in an accident.  Avoid raw meat, uncooked cheese, cat litter boxes and soil used by cats throughout the pregnancy. These carry germs that can cause birth defects in the baby.  The first trimester is a good time to visit your dentist for your dental health. Getting your teeth cleaned is OK. Use a softer toothbrush and  brush gently during pregnancy.  Ask for help if you have financial, counseling or nutritional needs during pregnancy. Your caregiver will be able to offer counseling for these needs as well as refer you for other special needs.  Do not take any medications or herbs unless told by your caregiver.  Inform your caregiver if there is any mental or physical domestic violence.  Make a list of emergency phone numbers of family, friends, hospital, and police and fire departments.  Write down your questions. Take them to your prenatal visit.  Do not douche.  Do not cross your legs.  If you have to stand for long periods of time, rotate you feet or take small steps in a circle.  You may have more vaginal secretions that may require a sanitary pad. Do not use tampons or scented sanitary pads. MEDICATIONS AND DRUG USE IN PREGNANCY  Take prenatal vitamins as directed. The vitamin should contain 1 milligram of folic acid. Keep all vitamins out of reach of children. Only a couple vitamins or tablets containing iron may be  fatal to a baby or young child when ingested.  Avoid use of all medications, including herbs, over-the-counter medications, not prescribed or suggested by your caregiver. Only take over-the-counter or prescription medicines for pain, discomfort, or fever as directed by your caregiver. Do not use aspirin, ibuprofen, or naproxen unless directed by your caregiver.  Let your caregiver also know about herbs you may be using.  Alcohol is related to a number of birth defects. This includes fetal alcohol syndrome. All alcohol, in any form, should be avoided completely. Smoking will cause low birth rate and premature babies.  Street or illegal drugs are very harmful to the baby. They are absolutely forbidden. A baby born to an addicted mother will be addicted at birth. The baby will go through the same withdrawal an adult does.  Let your caregiver know about any medications that you have to  take and for what reason you take them. MISCARRIAGE IS COMMON DURING PREGNANCY A miscarriage does not mean you did something wrong. It is not a reason to worry about getting pregnant again. Your caregiver will help you with questions you may have. If you have a miscarriage, you may need minor surgery. SEEK MEDICAL CARE IF:  You have any concerns or worries during your pregnancy. It is better to call with your questions if you feel they cannot wait, rather than worry about them. SEEK IMMEDIATE MEDICAL CARE IF:   An unexplained oral temperature above 102 F (38.9 C) develops, or as your caregiver suggests.  You have leaking of fluid from the vagina (birth canal). If leaking membranes are suspected, take your temperature and inform your caregiver of this when you call.  There is vaginal spotting or bleeding. Notify your caregiver of the amount and how many pads are used.  You develop a bad smelling vaginal discharge with a change in the color.  You continue to feel sick to your stomach (nauseated) and have no relief from remedies suggested. You vomit blood or coffee ground-like materials.  You lose more than 2 pounds of weight in 1 week.  You gain more than 2 pounds of weight in 1 week and you notice swelling of your face, hands, feet, or legs.  You gain 5 pounds or more in 1 week (even if you do not have swelling of your hands, face, legs, or feet).  You get exposed to [place] measles and have never had them.  You are exposed to fifth disease or chickenpox.  You develop belly (abdominal) pain. Round ligament discomfort is a common non-cancerous (benign) cause of abdominal pain in pregnancy. Your caregiver still must evaluate this.  You develop headache, fever, diarrhea, pain with urination, or shortness of breath.  You fall or are in a car accident or have any kind of trauma.  There is mental or physical violence in your home. Document Released: 11/22/2001 Document Revised: 02/20/2012  Document Reviewed: 05/26/2009 [facility] Patient Information 2013 [place], [place].  Gestational Diabetes Mellitus Gestational diabetes mellitus (GDM) is diabetes that occurs only during pregnancy. This happens when the body cannot properly handle the glucose (sugar) that increases in the blood after eating. During pregnancy, insulin resistance (reduced sensitivity to insulin) occurs because of the release of hormones from the placenta. Usually, the pancreas of pregnant women produces enough insulin to overcome the resistance that occurs. However, in gestational diabetes, the insulin is there but it does not work effectively. If the resistance is severe enough that the pancreas does not produce enough insulin, extra glucose builds  up in the blood.  WHO IS AT RISK FOR DEVELOPING GESTATIONAL DIABETES?  Women with a history of diabetes in the family.  Women over age 54.  Women who are overweight.  Women in certain ethnic groups (Hispanic, African American, Native American, [place] and [place] Islander). WHAT CAN HAPPEN TO THE BABY? If the mother's blood glucose is too high while she is pregnant, the extra sugar will travel through the umbilical cord to the baby. Some of the problems the baby may have are:  Large Baby - If the baby receives too much sugar, the baby will gain more weight. This may cause the baby to be too large to be born normally (vaginally) and a Cesarean section (C-section) may be needed.  Low Blood Glucose (hypoglycemia)  The baby makes extra insulin, in response to the extra sugar its gets from its mother. When the baby is born and no longer needs this extra insulin, the baby's blood glucose level may drop.  Jaundice (yellow coloring of the skin and eyes)  This is fairly common in babies. It is caused from a build-up of the chemical called bilirubin. This is rarely serious, but is seen more often in babies whose mothers had gestational diabetes. RISKS TO THE MOTHER Women who have  had gestational diabetes may be at higher risk for some problems, including:  Preeclampsia or toxemia, which includes problems with high blood pressure. Blood pressure and protein levels in the urine must be checked frequently.  Infections.  Cesarean section (C-section) for delivery.  Developing Type 2 diabetes later in life. About 30-50% will develop diabetes later, especially if obese. DIAGNOSIS  The hormones that cause insulin resistance are highest at about 24-28 weeks of pregnancy. If symptoms are experienced, they are much like symptoms you would normally expect during pregnancy.  GDM is often diagnosed using a two part method: 1. After 24-28 weeks of pregnancy, the woman drinks a glucose solution and takes a blood test. If the glucose level is high, a second test will be given. 2. Oral Glucose Tolerance Test (OGTT) which is 3 hours long  After not eating overnight, the blood glucose is checked. The woman drinks a glucose solution, and hourly blood glucose tests are taken. If the woman has risk factors for GDM, the caregiver may test earlier than 24 weeks of pregnancy. TREATMENT  Treatment of GDM is directed at keeping the mother's blood glucose level normal, and may include:  Meal planning.  Taking insulin or other medicine to control your blood glucose level.  Exercise.  Keeping a daily record of the foods you eat.  Blood glucose monitoring and keeping a record of your blood glucose levels.  May monitor ketone levels in the urine, although this is no longer considered necessary in most pregnancies. HOME CARE INSTRUCTIONS  While you are pregnant:  Follow your caregiver's advice regarding your prenatal appointments, meal planning, exercise, medicines, vitamins, blood and other tests, and physical activities.  Keep a record of your meals, blood glucose tests, and the amount of insulin you are taking (if any). Show this to your caregiver at every prenatal visit.  If you have  GDM, you may have problems with hypoglycemia (low blood glucose). You may suspect this if you become suddenly dizzy, feel shaky, and/or weak. If you think this is happening and you have a glucose meter, try to test your blood glucose level. Follow your caregiver's advice for when and how to treat your low blood glucose. Generally, the 15:15 rule is  followed: Treat by consuming 15 grams of carbohydrates, wait 15 minutes, and recheck blood glucose. Examples of 15 grams of carbohydrates are:  1 cup skim or low-fat milk.   cup juice.  3-4 glucose tablets.  5-6 hard candies.  1 small box raisins.   cup regular soda pop.  Practice good hygiene, to avoid infections.  Do not smoke. SEEK MEDICAL CARE IF:   You develop abnormal vaginal discharge, with or without itching.  You become weak and tired more than expected.  You seem to sweat a lot.  You have a sudden increase in weight, 5 pounds or more in one week.  You are losing weight, 3 pounds or more in a week.  Your blood glucose level is high, and you need instructions on what to do about it. SEEK IMMEDIATE MEDICAL CARE IF:   You develop a severe headache.  You faint or pass out.  You develop nausea and vomiting.  You become disoriented or confused.  You have a convulsion.  You develop vision problems.  You develop stomach pain.  You develop vaginal bleeding.  You develop uterine contractions.  You have leaking or a gush of fluid from the vagina. AFTER YOU HAVE THE BABY:  Go to all of your follow-up appointments, and have blood tests as advised by your caregiver.  Maintain a healthy lifestyle, to prevent diabetes in the future. This includes:  Following a healthy meal plan.  Controlling your weight.  Getting enough exercise and proper rest.  Do not smoke.  Breastfeed your baby if you can. This will lower the chance of you and your baby developing diabetes later in life. For more information about diabetes,  go to the American Diabetes Association at: PMFashions.com.cy. For more information about gestational diabetes, go to the [employer] at: RentRule.com.au. Document Released: 03/06/2001 Document Revised: 02/20/2012 Document Reviewed: 09/28/2009 [facility] Patient Information 2013 [address], [place].  Breastfeeding Deciding to breastfeed is one of the best choices you can make for you and your baby. The information that follows gives a brief overview of the benefits of breastfeeding as well as common topics surrounding breastfeeding. BENEFITS OF BREASTFEEDING For the baby  The first milk (colostrum) helps the baby's digestive system function better.   There are antibodies in the mother's milk that help the baby fight off infections.   The baby has a lower incidence of asthma, allergies, and sudden infant death syndrome (SIDS).   The nutrients in breast milk are better for the baby than infant formulas, and breast milk helps the baby's brain grow better.   Babies who breastfeed have less gas, colic, and constipation.  For the mother  Breastfeeding helps develop a very special bond between the mother and her baby.   Breastfeeding is convenient, always available at the correct temperature, and costs nothing.   Breastfeeding burns calories in the mother and helps her lose weight that was gained during pregnancy.   Breastfeeding makes the uterus contract back down to normal size faster and slows bleeding following delivery.   Breastfeeding mothers have a lower risk of developing breast cancer.  BREASTFEEDING FREQUENCY  A healthy, full-term baby may breastfeed as often as every hour or space his or her feedings to every 3 hours.   Watch your baby for signs of hunger. Nurse your baby if he or she shows signs of hunger. How often you nurse will vary from baby to baby.   Nurse as often as the baby requests, or when  you feel the  need to reduce the fullness of your breasts.   Awaken the baby if it has been 3 4 hours since the last feeding.   Frequent feeding will help the mother make more milk and will help prevent problems, such as sore nipples and engorgement of the breasts.  BABY'S POSITION AT THE BREAST  Whether lying down or sitting, be sure that the baby's tummy is facing your tummy.   Support the breast with 4 fingers underneath the breast and the thumb above. Make sure your fingers are well away from the nipple and baby's mouth.   Stroke the baby's lips gently with your finger or nipple.   When the baby's mouth is open wide enough, place all of your nipple and as much of the areola as possible into your baby's mouth.   Pull the baby in close so the tip of the nose and the baby's cheeks touch the breast during the feeding.  FEEDINGS AND SUCTION  The length of each feeding varies from baby to baby and from feeding to feeding.   The baby must suck about 2 3 minutes for your milk to get to him or her. This is called a "let down." For this reason, allow the baby to feed on each breast as long as he or she wants. Your baby will end the feeding when he or she has received the right balance of nutrients.   To break the suction, put your finger into the corner of the baby's mouth and slide it between his or her gums before removing your breast from his or her mouth. This will help prevent sore nipples.  HOW TO TELL WHETHER YOUR BABY IS GETTING ENOUGH BREAST MILK. Wondering whether or not your baby is getting enough milk is a common concern among mothers. You can be assured that your baby is getting enough milk if:   Your baby is actively sucking and you hear swallowing.   Your baby seems relaxed and satisfied after a feeding.   Your baby nurses at least 8 12 times in a 24 hour time period. Nurse your baby until he or she unlatches or falls asleep at the first breast (at least 10 20 minutes), then  offer the second side.   Your baby is wetting 5 6 disposable diapers (6 8 cloth diapers) in a 24 hour period by 17 71 days of age.   Your baby is having at least 3 4 stools every 24 hours for the first 6 weeks. The stool should be soft and yellow.   Your baby should gain 4 7 ounces per week after he or she is 32 days old.   Your breasts feel softer after nursing.  REDUCING BREAST ENGORGEMENT  In the first week after your baby is born, you may experience signs of breast engorgement. When breasts are engorged, they feel heavy, warm, full, and may be tender to the touch. You can reduce engorgement if you:   Nurse frequently, every 2 3 hours. Mothers who breastfeed early and often have fewer problems with engorgement.   Place light ice packs on your breasts for 10 20 minutes between feedings. This reduces swelling. Wrap the ice packs in a lightweight towel to protect your skin. Bags of frozen vegetables work well for this purpose.   Take a warm shower or apply warm, moist heat to your breast for 5 10 minutes just before each feeding. This increases circulation and helps the milk flow.   Gently  massage your breast before and during the feeding. Using your finger tips, massage from the chest wall towards your nipple in a circular motion.   Make sure that the baby empties at least one breast at every feeding before switching sides.   Use a breast pump to empty the breasts if your baby is sleepy or not nursing well. You may also want to pump if you are returning to work oryou feel you are getting engorged.   Avoid bottle feeds, pacifiers, or supplemental feedings of water or juice in place of breastfeeding. Breast milk is all the food your baby needs. It is not necessary for your baby to have water or formula. In fact, to help your breasts make more milk, it is best not to give your baby supplemental feedings during the early weeks.   Be sure the baby is latched on and positioned  properly while breastfeeding.   Wear a supportive bra, avoiding underwire styles.   Eat a balanced diet with enough fluids.   Rest often, relax, and take your prenatal vitamins to prevent fatigue, stress, and anemia.  If you follow these suggestions, your engorgement should improve in 24 48 hours. If you are still experiencing difficulty, call your lactation consultant or caregiver.  CARING FOR YOURSELF Take care of your breasts  Bathe or shower daily.   Avoid using soap on your nipples.   Start feedings on your left breast at one feeding and on your right breast at the next feeding.   You will notice an increase in your milk supply 2 5 days after delivery. You may feel some discomfort from engorgement, which makes your breasts very firm and often tender. Engorgement "peaks" out within 24 48 hours. In the meantime, apply warm moist towels to your breasts for 5 10 minutes before feeding. Gentle massage and expression of some milk before feeding will soften your breasts, making it easier for your baby to latch on.   Wear a well-fitting nursing bra, and air dry your nipples for a 3 after each feeding.   Only use cotton bra pads.   Only use pure lanolin on your nipples after nursing. You do not need to wash it off before feeding the baby again. Another option is to express a few drops of breast milk and gently massage it into your nipples.  Take care of yourself  Eat well-balanced meals and nutritious snacks.   Drinking milk, fruit juice, and water to satisfy your thirst (about 8 glasses a day).   Get plenty of rest.  Avoid foods that you notice affect the baby in a bad way.  SEEK MEDICAL CARE IF:   You have difficulty with breastfeeding and need help.   You have a hard, red, sore area on your breast that is accompanied by a fever.   Your baby is too sleepy to eat well or is having trouble sleeping.   Your baby is wetting less than 6 diapers a day, by 22  days of age.   Your baby's skin or [place] part of his or her eyes is more yellow than it was in the hospital.   You feel depressed.  Document Released: 11/28/2005 Document Revised: 05/29/2012 Document Reviewed: 02/26/2012 [facility] Patient Information 2013 [place], [place].

## 2013-02-25 ENCOUNTER — Ambulatory Visit (INDEPENDENT_AMBULATORY_CARE_PROVIDER_SITE_OTHER): Payer: Medicaid Other | Admitting: Obstetrics & Gynecology

## 2013-02-25 ENCOUNTER — Other Ambulatory Visit: Payer: Self-pay | Admitting: Obstetrics & Gynecology

## 2013-02-25 VITALS — BP 150/90 | Wt 226.1 lb

## 2013-02-25 DIAGNOSIS — O24911 Unspecified diabetes mellitus in pregnancy, first trimester: Secondary | ICD-10-CM

## 2013-02-25 DIAGNOSIS — O10011 Pre-existing essential hypertension complicating pregnancy, first trimester: Secondary | ICD-10-CM

## 2013-02-25 LAB — GLUCOSE, CAPILLARY: Glucose-Capillary: 142 mg/dL — ABNORMAL HIGH (ref 70–99)

## 2013-02-25 LAB — POCT URINALYSIS DIP (DEVICE)
Bilirubin Urine: NEGATIVE
Ketones, ur: NEGATIVE mg/dL
Leukocytes, UA: NEGATIVE
pH: 7 (ref 5.0–8.0)

## 2013-02-25 MED ORDER — LABETALOL HCL 200 MG PO TABS: 400.0000 mg | ORAL_TABLET | Freq: Two times a day (BID) | ORAL | Status: DC

## 2013-02-25 NOTE — Progress Notes (Signed)
Informal [place] for viability- single IUP w/+cardiac motion and +FM noted. Pt felt reassured.

## 2013-02-25 NOTE — Progress Notes (Signed)
Pulse: 81

## 2013-02-25 NOTE — Patient Instructions (Addendum)
Blood pressure medicine has increased to 400 mg twice a day.  Take four tablets of what you have (100mg ) twice a day. When you finish this, get new prescription from [facility] for 200 mg tablets which you would take 2 tablets twice a day.  Your ultrasound is scheduled at [facility] on 04/10/13 at 10:30 am   Return to clinic for any obstetric concerns or go to [facility] for evaluation

## 2013-02-25 NOTE — Progress Notes (Signed)
Forgot log book; random CBG here in clinic 142 about three hours after light breakfast.  Emphasized importance of bringing log book, also told to bring meter next visit in one week.  Discussed consequences of inadequate glycemic control; increased morbidity/mortality for the patient and and the fetus emphasized.  Declines genetic screening, anatomy scan scheduled.  Labetalol increased to 400 mg po bid, will continue to monitor BP.  BP/preeclampsia and obstetric precautions reviewed.

## 2013-02-26 ENCOUNTER — Other Ambulatory Visit (HOSPITAL_COMMUNITY): Payer: Self-pay

## 2013-02-26 ENCOUNTER — Ambulatory Visit (HOSPITAL_COMMUNITY): Payer: Self-pay

## 2013-03-04 ENCOUNTER — Encounter: Payer: Self-pay | Admitting: Advanced Practice Midwife

## 2013-03-25 ENCOUNTER — Ambulatory Visit (INDEPENDENT_AMBULATORY_CARE_PROVIDER_SITE_OTHER): Payer: Medicaid Other | Admitting: Obstetrics & Gynecology

## 2013-03-25 VITALS — BP 159/114 | Temp 98.6°F

## 2013-03-25 DIAGNOSIS — E119 Type 2 diabetes mellitus without complications: Secondary | ICD-10-CM

## 2013-03-25 DIAGNOSIS — O10011 Pre-existing essential hypertension complicating pregnancy, first trimester: Secondary | ICD-10-CM

## 2013-03-25 DIAGNOSIS — N760 Acute vaginitis: Secondary | ICD-10-CM

## 2013-03-25 DIAGNOSIS — A499 Bacterial infection, unspecified: Secondary | ICD-10-CM

## 2013-03-25 DIAGNOSIS — O24919 Unspecified diabetes mellitus in pregnancy, unspecified trimester: Secondary | ICD-10-CM

## 2013-03-25 DIAGNOSIS — O10019 Pre-existing essential hypertension complicating pregnancy, unspecified trimester: Secondary | ICD-10-CM

## 2013-03-25 DIAGNOSIS — B3731 Acute candidiasis of vulva and vagina: Secondary | ICD-10-CM

## 2013-03-25 DIAGNOSIS — B373 Candidiasis of vulva and vagina: Secondary | ICD-10-CM

## 2013-03-25 DIAGNOSIS — I10 Essential (primary) hypertension: Secondary | ICD-10-CM

## 2013-03-25 DIAGNOSIS — B9689 Other specified bacterial agents as the cause of diseases classified elsewhere: Secondary | ICD-10-CM

## 2013-03-25 DIAGNOSIS — O24912 Unspecified diabetes mellitus in pregnancy, second trimester: Secondary | ICD-10-CM

## 2013-03-25 LAB — POCT URINALYSIS DIP (DEVICE)
Bilirubin Urine: NEGATIVE
pH: 7 (ref 5.0–8.0)

## 2013-03-25 LAB — WET PREP, GENITAL
Trich, Wet Prep: NONE SEEN
Yeast Wet Prep HPF POC: NONE SEEN

## 2013-03-25 MED ORDER — LABETALOL HCL 300 MG PO TABS: 600.0000 mg | ORAL_TABLET | Freq: Two times a day (BID) | ORAL | Status: DC

## 2013-03-25 MED ORDER — FLUCONAZOLE 150 MG PO TABS: 150.0000 mg | ORAL_TABLET | Freq: Once | ORAL | Status: DC

## 2013-03-25 NOTE — Progress Notes (Signed)
Pulse  89 2nd BP: 156/109  Vaginal d/c stated as creamy [place] with itch.

## 2013-03-25 NOTE — Progress Notes (Addendum)
Increased Labetalol and all insulin given all elevated values. Emphasized importance of compliance with diet and checking CBGs.  Anatomy scan already scheduled.  Pelvic exam showed [place]umpp discharge, wet prep done; will follow up results and manage accordingly. Obstetric precautions reviewed.  Lab Addendum Results for orders placed in visit on 03/25/13 (from the past 24 hour(s))  WET PREP, GENITAL     Status: Abnormal   Collection Time    03/25/13 12:11 PM      Result Value Range   Yeast Wet Prep HPF POC NONE SEEN  NONE SEEN   Trich, Wet Prep NONE SEEN  NONE SEEN   Clue Cells Wet Prep HPF POC MOD (*) NONE SEEN   WBC, Wet Prep HPF POC MOD (*) NONE SEEN   Narrative:    Performed at:  [facility]                [address], [address]                [place], [place] [address]  Metronidazole prescribed; patient will be informed of results and told to pick up prescription.

## 2013-03-26 ENCOUNTER — Other Ambulatory Visit: Payer: Self-pay | Admitting: Obstetrics & Gynecology

## 2013-03-26 MED ORDER — METRONIDAZOLE 500 MG PO TABS: 500.0000 mg | ORAL_TABLET | Freq: Two times a day (BID) | ORAL | Status: AC

## 2013-03-26 NOTE — Addendum Note (Signed)
Addended by: Jaynie Collins A on: 03/26/2013 11:29 AM   Modules accepted: Orders

## 2013-04-01 ENCOUNTER — Encounter: Payer: Self-pay | Admitting: Obstetrics and Gynecology

## 2013-04-08 ENCOUNTER — Encounter: Payer: Self-pay | Admitting: Obstetrics and Gynecology

## 2013-04-10 ENCOUNTER — Ambulatory Visit (HOSPITAL_COMMUNITY)
Admission: RE | Admit: 2013-04-10 | Discharge: 2013-04-10 | Disposition: A | Discharge status: Home / Self Care | Payer: Medicaid Other | Source: Ambulatory Visit | Attending: Obstetrics and Gynecology | Admitting: Obstetrics and Gynecology

## 2013-04-10 VITALS — BP 158/107 | HR 87 | Wt 232.5 lb

## 2013-04-10 DIAGNOSIS — Z1389 Encounter for screening for other disorder: Secondary | ICD-10-CM | POA: Insufficient documentation

## 2013-04-10 DIAGNOSIS — O99810 Abnormal glucose complicating pregnancy: Secondary | ICD-10-CM | POA: Insufficient documentation

## 2013-04-10 DIAGNOSIS — O24912 Unspecified diabetes mellitus in pregnancy, second trimester: Secondary | ICD-10-CM

## 2013-04-10 DIAGNOSIS — E119 Type 2 diabetes mellitus without complications: Secondary | ICD-10-CM

## 2013-04-10 DIAGNOSIS — O0992 Supervision of high risk pregnancy, unspecified, second trimester: Secondary | ICD-10-CM

## 2013-04-10 DIAGNOSIS — O358XX0 Maternal care for other (suspected) fetal abnormality and damage, not applicable or unspecified: Secondary | ICD-10-CM | POA: Insufficient documentation

## 2013-04-10 DIAGNOSIS — I10 Essential (primary) hypertension: Secondary | ICD-10-CM

## 2013-04-10 DIAGNOSIS — O10019 Pre-existing essential hypertension complicating pregnancy, unspecified trimester: Secondary | ICD-10-CM | POA: Insufficient documentation

## 2013-04-10 DIAGNOSIS — Z363 Encounter for antenatal screening for malformations: Secondary | ICD-10-CM | POA: Insufficient documentation

## 2013-04-15 ENCOUNTER — Other Ambulatory Visit: Payer: Self-pay | Admitting: Obstetrics and Gynecology

## 2013-04-15 ENCOUNTER — Encounter: Payer: Self-pay | Admitting: Obstetrics and Gynecology

## 2013-04-15 ENCOUNTER — Ambulatory Visit (INDEPENDENT_AMBULATORY_CARE_PROVIDER_SITE_OTHER): Payer: Medicaid Other | Admitting: Obstetrics and Gynecology

## 2013-04-15 VITALS — BP 170/100 | Wt 228.3 lb

## 2013-04-15 DIAGNOSIS — O10019 Pre-existing essential hypertension complicating pregnancy, unspecified trimester: Secondary | ICD-10-CM

## 2013-04-15 DIAGNOSIS — O0992 Supervision of high risk pregnancy, unspecified, second trimester: Secondary | ICD-10-CM

## 2013-04-15 DIAGNOSIS — O24912 Unspecified diabetes mellitus in pregnancy, second trimester: Secondary | ICD-10-CM

## 2013-04-15 DIAGNOSIS — E119 Type 2 diabetes mellitus without complications: Secondary | ICD-10-CM

## 2013-04-15 DIAGNOSIS — O10011 Pre-existing essential hypertension complicating pregnancy, first trimester: Secondary | ICD-10-CM

## 2013-04-15 DIAGNOSIS — I10 Essential (primary) hypertension: Secondary | ICD-10-CM

## 2013-04-15 DIAGNOSIS — O24919 Unspecified diabetes mellitus in pregnancy, unspecified trimester: Secondary | ICD-10-CM

## 2013-04-15 DIAGNOSIS — O24911 Unspecified diabetes mellitus in pregnancy, first trimester: Secondary | ICD-10-CM

## 2013-04-15 LAB — POCT URINALYSIS DIP (DEVICE)
Glucose, UA: 500 mg/dL — AB
Hgb urine dipstick: NEGATIVE
Nitrite: NEGATIVE
Protein, ur: 30 mg/dL — AB
Urobilinogen, UA: 1 mg/dL (ref 0.0–1.0)

## 2013-04-15 MED ORDER — INSULIN NPH (HUMAN) (ISOPHANE) 100 UNIT/ML ~~LOC~~ SUSP: 15.0000 [IU] | Freq: Every day | SUBCUTANEOUS | Status: DC

## 2013-04-15 MED ORDER — INSULIN NPH (HUMAN) (ISOPHANE) 100 UNIT/ML ~~LOC~~ SUSP: 50.0000 [IU] | Freq: Every day | SUBCUTANEOUS | Status: DC

## 2013-04-15 MED ORDER — LABETALOL HCL 300 MG PO TABS: 600.0000 mg | ORAL_TABLET | Freq: Two times a day (BID) | ORAL | Status: DC

## 2013-04-15 MED ORDER — INSULIN REGULAR HUMAN 100 UNIT/ML IJ SOLN: 15.0000 [IU] | Freq: Every day | INTRAMUSCULAR | Status: DC

## 2013-04-15 MED ORDER — INSULIN REGULAR HUMAN 100 UNIT/ML IJ SOLN: 26.0000 [IU] | Freq: Every day | INTRAMUSCULAR | Status: DC

## 2013-04-15 NOTE — Progress Notes (Signed)
Patient has been doing well. She reports that she has not taken neither her insulin or BP med in the last 2 weeks as she could not afford her medications. Will contact care management today for assistance with obtaining medication. Patient scheduled for fetal echo and has f/u ultrasound scheduled with MFM. FM reviewed.

## 2013-04-15 NOTE — Progress Notes (Signed)
Pulse:  Patient has not taken any meds in past 2 weeks, including bp meds.

## 2013-04-22 ENCOUNTER — Encounter: Payer: Medicaid Other | Admitting: Family

## 2013-05-02 ENCOUNTER — Encounter: Payer: Self-pay | Admitting: *Deleted

## 2013-05-02 DIAGNOSIS — O24919 Unspecified diabetes mellitus in pregnancy, unspecified trimester: Secondary | ICD-10-CM

## 2013-05-20 ENCOUNTER — Ambulatory Visit (INDEPENDENT_AMBULATORY_CARE_PROVIDER_SITE_OTHER): Payer: Medicaid Other | Admitting: Family Medicine

## 2013-05-20 VITALS — BP 147/103 | Temp 98.1°F | Wt 238.1 lb

## 2013-05-20 DIAGNOSIS — O24919 Unspecified diabetes mellitus in pregnancy, unspecified trimester: Secondary | ICD-10-CM

## 2013-05-20 DIAGNOSIS — N949 Unspecified condition associated with female genital organs and menstrual cycle: Secondary | ICD-10-CM

## 2013-05-20 DIAGNOSIS — I10 Essential (primary) hypertension: Secondary | ICD-10-CM

## 2013-05-20 DIAGNOSIS — O10019 Pre-existing essential hypertension complicating pregnancy, unspecified trimester: Secondary | ICD-10-CM

## 2013-05-20 DIAGNOSIS — O24912 Unspecified diabetes mellitus in pregnancy, second trimester: Secondary | ICD-10-CM

## 2013-05-20 DIAGNOSIS — N766 Ulceration of vulva: Secondary | ICD-10-CM

## 2013-05-20 DIAGNOSIS — O10011 Pre-existing essential hypertension complicating pregnancy, first trimester: Secondary | ICD-10-CM

## 2013-05-20 LAB — POCT URINALYSIS DIP (DEVICE)
Ketones, ur: 15 mg/dL — AB
Protein, ur: 100 mg/dL — AB
Specific Gravity, Urine: >=1.03 (ref 1.005–1.030)
pH: 6 (ref 5.0–8.0)

## 2013-05-20 MED ORDER — FLUCONAZOLE 150 MG PO TABS: 150.0000 mg | ORAL_TABLET | Freq: Once | ORAL | Status: DC

## 2013-05-20 MED ORDER — LABETALOL HCL 300 MG PO TABS: 600.0000 mg | ORAL_TABLET | Freq: Three times a day (TID) | ORAL | Status: DC

## 2013-05-20 NOTE — Patient Instructions (Addendum)
Increase blood pressure medication to 2 tablets (600 mg) every 8 hours (three times a day).  Come in for blood pressure check next week and to meet with diabetes coordinator.   Hypertension During Pregnancy Hypertension is also called high blood pressure. It can occur at any time in life and during pregnancy. When you have hypertension, there is extra pressure inside your blood vessels that carry blood from the heart to the rest of your body (arteries). Hypertension during pregnancy can cause problems for you and your baby. Your baby might not weigh as much as it should at birth or might be born early (premature). Very bad cases of hypertension during pregnancy can be life-threatening.  There are different types of hypertension during pregnancy.   Chronic hypertension. This happens when a woman has hypertension before pregnancy and it continues during pregnancy.  Gestational hypertension. This is when hypertension develops during pregnancy.  Preeclampsia or toxemia of pregnancy. This is a very serious type of hypertension that develops only during pregnancy. It is a disease that affects the whole body (systemic) and can be very dangerous for both mother and baby.  Gestational hypertension and preeclampsia usually go away after your baby is born. Blood pressure generally stabilizes within 6 weeks. Women who have hypertension during pregnancy have a greater chance of developing hypertension later in life or with future pregnancies. UNDERSTANDING BLOOD PRESSURE Blood pressure moves blood in your body. Sometimes, the force that moves the blood becomes too strong.  A blood pressure reading is given in 2 numbers and looks like a fraction.  The top number is called the systolic pressure. When your heart beats, it forces more blood to flow through the arteries. Pressure inside the arteries goes up.  The bottom number is the diastolic pressure. Pressure goes down between beats. That is when the heart is  resting.  You may have hypertension if:  Your systolic blood pressure is above 140.  Your diastolic pressure is above 90. RISK FACTORS Some factors make you more likely to develop hypertension during pregnancy. Risk factors include:  Having hypertension before pregnancy.  Having hypertension during a previous pregnancy.  Being overweight.  Being older than 40.  Being pregnant with more than 1 baby (multiples).  Having diabetes or kidney problems. SYMPTOMS Chronic and gestational hypertension may not cause symptoms. Preeclampsia has symptoms, which may include:  Increased protein in your urine. Your caregiver will check for this at every prenatal visit.  Swelling of your hands and face.  Rapid weight gain.  Headaches.  Visual changes.  Being bothered by light.  Abdominal pain, especially in the right upper area.  Chest pain.  Shortness of breath.  Increased reflexes.  Seizures. Seizures occur with a more severe form of preeclampsia, called eclampsia. DIAGNOSIS   You may be diagnosed with hypertension during pregnancy during a regular prenatal exam. At each visit, tests may include:  Blood pressure checks.  A urine test to check for protein in your urine.  The type of hypertension you are diagnosed with depends on when you developed it. It also depends on your specific blood pressure reading.  Developing hypertension before 20 weeks of pregnancy is consistent with chronic hypertension.  Developing hypertension after 20 weeks of pregnancy is consistent with gestational hypertension.  Hypertension with increased urinary protein is diagnosed as preeclampsia.  Blood pressure measurements that stay above 160 systolic or 110 diastolic are a sign of severe preeclampsia. TREATMENT Treatment for hypertension during pregnancy varies. Treatment depends on the  type of hypertension and how serious it is.  If you take medicine for chronic hypertension, you may need to  switch medicines.  Drugs called ACE inhibitors should not be taken during pregnancy.  Low-dose aspirin may be suggested for women who have risk factors for preeclampsia.  If you have gestational hypertension, you may need to take a blood pressure medicine that is safe during pregnancy. Your caregiver will recommend the appropriate medicine.  If you have severe preeclampsia, you may need to be in the hospital. Caregivers will watch you and the baby very closely. You also may need to take medicine (magnesium sulfate) to prevent seizures and lower blood pressure.  Sometimes an early delivery is needed. This may be the case if the condition worsens. It would be done to protect you and the baby. The only cure for preeclampsia is delivery. HOME CARE INSTRUCTIONS  Schedule and keep all of your regular prenatal care.  Follow your caregiver's instructions for taking medicines. Tell your caregiver about all medicines you take. This includes over-the-counter medicines.  Eat as little salt as possible.  Get regular exercise.  Do not drink alcohol.  Do not use tobacco products.  Do not drink products with caffeine.  Lie on your left side when resting.  Tell your doctor if you have any preeclampsia symptoms. SEEK IMMEDIATE MEDICAL CARE IF:  You have severe abdominal pain.  You have sudden swelling in the hands, ankles, or face.  You gain 4 pounds (1.8 kg) or more in 1 week.  You vomit repeatedly.  You have vaginal bleeding.  You do not feel the baby moving as much.  You have a headache.  You have blurred or double vision.  You have muscle twitching or spasms.  You have shortness of breath.  You have blue fingernails and lips.  You have blood in your urine. MAKE SURE YOU:  Understand these instructions.  Will watch your condition.  Will get help right away if you are not doing well. Document Released: 08/16/2011 Document Revised: 02/20/2012 Document Reviewed:  08/16/2011 [facility] Patient Information 2014 [place], [place].   Monilial Vaginitis Vaginitis in a soreness, swelling and redness (inflammation) of the vagina and vulva. Monilial vaginitis is not a sexually transmitted infection. CAUSES  Yeast vaginitis is caused by yeast (candida) that is normally found in your vagina. With a yeast infection, the candida has overgrown in number to a point that upsets the chemical balance. SYMPTOMS   Loper, thick vaginal discharge.  Swelling, itching, redness and irritation of the vagina and possibly the lips of the vagina (vulva).  Burning or painful urination.  Painful intercourse. DIAGNOSIS  Things that may contribute to monilial vaginitis are:  Postmenopausal and virginal states.  Pregnancy.  Infections.  Being tired, sick or stressed, especially if you had monilial vaginitis in the past.  Diabetes. Good control will help lower the chance.  Birth control pills.  Tight fitting garments.  Using bubble bath, feminine sprays, douches or deodorant tampons.  Taking certain medications that kill germs (antibiotics).  Sporadic recurrence can occur if you become ill. TREATMENT  Your caregiver will give you medication.  There are several kinds of anti monilial vaginal creams and suppositories specific for monilial vaginitis. For recurrent yeast infections, use a suppository or cream in the vagina 2 times a week, or as directed.  Anti-monilial or steroid cream for the itching or irritation of the vulva may also be used. Get your caregiver's permission.  Painting the vagina with methylene blue solution  may help if the monilial cream does not work.  Eating yogurt may help prevent monilial vaginitis. HOME CARE INSTRUCTIONS   Finish all medication as prescribed.  Do not have sex until treatment is completed or after your caregiver tells you it is okay.  Take warm sitz baths.  Do not douche.  Do not use tampons, especially scented  ones.  Wear cotton underwear.  Avoid tight pants and panty hose.  Tell your sexual partner that you have a yeast infection. They should go to their caregiver if they have symptoms such as mild rash or itching.  Your sexual partner should be treated as well if your infection is difficult to eliminate.  Practice safer sex. Use condoms.  Some vaginal medications cause latex condoms to fail. Vaginal medications that harm condoms are:  Cleocin cream.  Butoconazole (Femstat).  Terconazole (Terazol) vaginal suppository.  Miconazole (Monistat) (may be purchased over the counter). SEEK MEDICAL CARE IF:   You have a temperature by mouth above 102 F (38.9 C).  The infection is getting worse after 2 days of treatment.  The infection is not getting better after 3 days of treatment.  You develop blisters in or around your vagina.  You develop vaginal bleeding, and it is not your menstrual period.  You have pain when you urinate.  You develop intestinal problems.  You have pain with sexual intercourse. Document Released: 09/07/2005 Document Revised: 02/20/2012 Document Reviewed: 05/22/2009 [facility] Patient Information 2014 [place], [place].   Gestational Diabetes Mellitus Gestational diabetes mellitus, often simply referred to as gestational diabetes, is a type of diabetes that some women develop during pregnancy. In gestational diabetes, the pancreas does not make enough insulin (a hormone), the cells are less responsive to the insulin that is made (insulin resistance), or both.Normally, insulin moves sugars from food into the tissue cells. The tissue cells use the sugars for energy. The lack of insulin or the lack of normal response to insulin causes excess sugars to build up in the blood instead of going into the tissue cells. As a result, high blood sugar (hyperglycemia) develops. The effect of high sugar (glucose) levels can cause many complications.  RISK FACTORS You have an  increased chance of developing gestational diabetes if you have a family history of diabetes and also have one or more of the following risk factors:  A body mass index over 30 (obesity).  A previous pregnancy with gestational diabetes.  An older age at the time of pregnancy. If blood glucose levels are kept in the normal range during pregnancy, women can have a healthy pregnancy. If your blood glucose levels are not well controlled, there may be risks to you, your unborn baby (fetus), your labor and delivery, or your newborn baby.  SYMPTOMS  If symptoms are experienced, they are much like symptoms you would normally expect during pregnancy. The symptoms of gestational diabetes include:   Increased thirst (polydipsia).  Increased urination (polyuria).  Increased urination during the night (nocturia).  Weight loss. This weight loss may be rapid.  Frequent, recurring infections.  Tiredness (fatigue).  Weakness.  Vision changes, such as blurred vision.  Fruity smell to your breath.  Abdominal pain. DIAGNOSIS Diabetes is diagnosed when blood glucose levels are increased. Your blood glucose level may be checked by one or more of the following blood tests:  A fasting blood glucose test. You will not be allowed to eat for at least 8 hours before a blood sample is taken.  A random blood glucose  test. Your blood glucose is checked at any time of the day regardless of when you ate.  A hemoglobin A1c blood glucose test. A hemoglobin A1c test provides information about blood glucose control over the previous 3 months.  An oral glucose tolerance test (OGTT). Your blood glucose is measured after you have not eaten (fasted) for 1 3 hours and then after you drink a glucose-containing beverage. Since the hormones that cause insulin resistance are highest at about 24 28 weeks of a pregnancy, an OGTT is usually performed during that time. If you have risk factors for gestational diabetes, your  caregiver may test you for gestational diabetes earlier than 24 weeks of pregnancy. TREATMENT   You will need to take diabetes medicine or insulin daily to keep blood glucose levels in the desired range.  You will need to match insulin dosing with exercise and healthy food choices. The treatment goal is to maintain the before meal (preprandial), bedtime, and overnight blood glucose level at 60 99 mg/dL during pregnancy. The treatment goal is to further maintain peak after meal blood sugar (postprandial glucose) level at 100 140 mg/dL.  HOME CARE INSTRUCTIONS   Have your hemoglobin A1c level checked twice a year.  Perform daily blood glucose monitoring as directed by your caregiver. It is common to perform frequent blood glucose monitoring.  Monitor urine ketones when you are ill and as directed by your caregiver.  Take your diabetes medicine and insulin as directed by your caregiver to maintain your blood glucose level in the desired range.  Never run out of diabetes medicine or insulin. It is needed every day.  Adjust insulin based on your intake of carbohydrates. Carbohydrates can raise blood glucose levels but need to be included in your diet. Carbohydrates provide vitamins, minerals, and fiber which are an essential part of a healthy diet. Carbohydrates are found in fruits, vegetables, whole grains, dairy products, legumes, and foods containing added sugars.    Eat healthy foods. Alternate 3 meals with 3 snacks.  Maintain a healthy weight gain. The usual total expected weight gain varies according to your prepregnancy body mass index (BMI).  Carry a medical alert card or wear your medical alert jewelry.  Carry a 15 gram carbohydrate snack with you at all times to treat low blood glucose (hypoglycemia). Some examples of 15 gram carbohydrate snacks include:  Glucose tablets, 3 or 4   Glucose gel, 15 gram tube  Raisins, 2 tablespoons (24 g)  Jelly beans, 6  Animal crackers,  8  Fruit juice, regular soda, or low fat milk, 4 ounces (120 mL)  Gummy treats, 9    Recognize hypoglycemia. Hypoglycemia during pregnancy occurs with blood glucose levels of 60 mg/dL and below. The risk for hypoglycemia increases when fasting or skipping meals, during or after intense exercise, and during sleep. Hypoglycemia symptoms can include:  Tremors or shakes.  Decreased ability to concentrate.  Sweating.  Increased heart rate.  Headache.  Dry mouth.  Hunger.  Irritability.  Anxiety.  Restless sleep.  Altered speech or coordination.  Confusion.  Treat hypoglycemia promptly. If you are alert and able to safely swallow, follow the 15:15 rule:  Take 15 20 grams of rapid-acting glucose or carbohydrate. Rapid-acting options include glucose gel, glucose tablets, or 4 ounces (120 mL) of fruit juice, regular soda, or low fat milk.  Check your blood glucose level 15 minutes after taking the glucose.   Take 15 20 grams more of glucose if the repeat blood glucose level  is still 70 mg/dL or below.  Eat a meal or snack within 1 hour once blood glucose levels return to normal.  Be alert to polyuria and polydipsia which are early signs of hyperglycemia. An early awareness of hyperglycemia allows for prompt treatment. Treat hyperglycemia as directed by your caregiver.  Engage in at least 30 minutes of physical activity a day or as directed by your caregiver. Ten minutes of physical activity timed 30 minutes after each meal is encouraged to control postprandial blood glucose levels.  Adjust your insulin dosing and food intake as needed if you start a new exercise or sport.  Follow your sick day plan at any time you are unable to eat or drink as usual.  Avoid tobacco and alcohol use.  Follow up with your caregiver regularly.  Follow the advice of your caregiver regarding your prenatal and post-delivery (postpartum) appointments, meal planning, exercise, medicines,  vitamins, blood tests, other medical tests, and physical activities.  Perform daily skin and foot care. Examine your skin and feet daily for cuts, bruises, redness, nail problems, bleeding, blisters, or sores.  Brush your teeth and gums at least twice a day and floss at least once a day. Follow up with your dentist regularly.  Schedule an eye exam during the first trimester of your pregnancy or as directed by your caregiver.  Share your diabetes management plan with your workplace or school.  Stay up-to-date with immunizations.  Learn to manage stress.  Obtain ongoing diabetes education and support as needed. SEEK MEDICAL CARE IF:   You are unable to eat food or drink fluids for more than 6 hours.  You have nausea and vomiting for more than 6 hours.  You have a blood glucose level of 200 mg/dL and you have ketones in your urine.  There is a change in mental status.  You develop vision problems.  You have a persistent headache.  You have upper abdominal pain or discomfort.  You develop an additional serious illness.  You have diarrhea for more than 6 hours.  You have been sick or have had a fever for a couple of days and are not getting better. SEEK IMMEDIATE MEDICAL CARE IF:   You have difficulty breathing.  You no longer feel the baby moving.  You are bleeding or have discharge from your vagina.  You start having premature contractions or labor. MAKE SURE YOU:  Understand these instructions.  Will watch your condition.  Will get help right away if you are not doing well or get worse. Document Released: 03/06/2001 Document Revised: 08/22/2012 Document Reviewed: 06/26/2012 [facility] Patient Information 2014 [place], [place].

## 2013-05-20 NOTE — Progress Notes (Signed)
05/20/2013 Diabetes Coordinator: Reviewed glucose log book. Pt not consistent with checking scheduled blood sugars. Reviewed with her schedule regarding meals, snacks and monitoring. Pt verbally expressed that she has limited financial resources as well as time. She works at [employer] on various shifts, therefore her sleeping times and meal times vary. Stressed to her the need to administer her NPH am dose no later than 8:00 am, to eat breakfast no later than 9:00am, check 2 hr pp cbg's before her snacks. Stressed the need to stay on schedule as much as possible, not to skip any doses of insulin nor meals/snacks. Pt very quiet and expressed understanding, but only by nodding and saying "okay". Instructed to return in 2 weeks with her log (gave her an easier flow to document). Assisted her with carb counting, serving sizes and the importance of frequent and small meals/snacks. According her to her weight and gestational age at 37 wks, this pt should be on approximately 84 total units of insulin per day.  She is presently on total of 65 units NPH and 50 units Regular.  However, she is with pre-existing type 2 which could require more than calculated insulin (due to less ability to make her own insulin and more resistance than usual.) Thank you, Lenor Coffin, RN, Diabetes CNS [phone])

## 2013-05-20 NOTE — Progress Notes (Signed)
P=92,  c/o has a cut in her perineal area that she thinks she got during sex with her partner, c/o burning in that area when urinates,

## 2013-05-20 NOTE — Progress Notes (Signed)
1.  HTN:  BP x 2 today 153/101, 147/103. Took Labetalol 8 AM (600 BID).  No HA or vision changes.  Will increase to 600 mg TID. 2.  Diabetes:  On NPH -50/26, Regular- 26 am, 15 night bk dinner. Not checking blood sugar every day. Only 21 checks in last month.  FBS: 51 to 250. 2 hour PP 76 to 278. 16/21 out of range. Lows of 51 and 62 in AM. Diet not good- eats a lot of fastfood at work. Doesn't eat at home much. Review diet and insulin regimen with Lenor Coffin today. 3.  C/o cut/abrasion on genitals from sexual intercourse 2-3 weeks ago. [place] vaginal discharge, burning with urination.  No cramping or bleeding. Wet prep sent. Treat with diflucan for presumed yeast. 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

## 2013-05-21 LAB — WET PREP, GENITAL: Trich, Wet Prep: NONE SEEN

## 2013-05-22 ENCOUNTER — Ambulatory Visit (HOSPITAL_COMMUNITY): Payer: MEDICAID

## 2013-05-29 ENCOUNTER — Ambulatory Visit: Payer: Medicaid Other | Admitting: Obstetrics and Gynecology

## 2013-05-29 VITALS — BP 152/105 | HR 92 | Ht 64.0 in | Wt 237.0 lb

## 2013-05-29 DIAGNOSIS — I10 Essential (primary) hypertension: Secondary | ICD-10-CM

## 2013-05-29 NOTE — Progress Notes (Signed)
Patient states taking labetalol BID. Per Wynelle Bourgeois, patient released with instruction to take labetelol as directed (every 8 hours). Patient to come back Friday for another BP check nurse visit. Patient agrees and satisfied.

## 2013-06-03 ENCOUNTER — Encounter: Payer: Self-pay | Admitting: Family Medicine

## 2013-06-03 ENCOUNTER — Ambulatory Visit (INDEPENDENT_AMBULATORY_CARE_PROVIDER_SITE_OTHER): Payer: Medicaid Other | Admitting: Family Medicine

## 2013-06-03 VITALS — BP 144/103 | Temp 97.2°F | Wt 241.7 lb

## 2013-06-03 DIAGNOSIS — O0992 Supervision of high risk pregnancy, unspecified, second trimester: Secondary | ICD-10-CM

## 2013-06-03 DIAGNOSIS — O24919 Unspecified diabetes mellitus in pregnancy, unspecified trimester: Secondary | ICD-10-CM

## 2013-06-03 DIAGNOSIS — E119 Type 2 diabetes mellitus without complications: Secondary | ICD-10-CM

## 2013-06-03 DIAGNOSIS — O98519 Other viral diseases complicating pregnancy, unspecified trimester: Secondary | ICD-10-CM

## 2013-06-03 DIAGNOSIS — O10019 Pre-existing essential hypertension complicating pregnancy, unspecified trimester: Secondary | ICD-10-CM

## 2013-06-03 DIAGNOSIS — O10012 Pre-existing essential hypertension complicating pregnancy, second trimester: Secondary | ICD-10-CM

## 2013-06-03 DIAGNOSIS — B009 Herpesviral infection, unspecified: Secondary | ICD-10-CM

## 2013-06-03 LAB — POCT URINALYSIS DIP (DEVICE)
Bilirubin Urine: NEGATIVE
Ketones, ur: NEGATIVE mg/dL
pH: 6.5 (ref 5.0–8.0)

## 2013-06-03 MED ORDER — VALACYCLOVIR HCL 1 G PO TABS: 1000.0000 mg | ORAL_TABLET | Freq: Two times a day (BID) | ORAL | Status: DC

## 2013-06-03 MED ORDER — GLUCOSE BLOOD VI STRP: ORAL_STRIP | Status: DC

## 2013-06-03 NOTE — Progress Notes (Signed)
Opthamology appointment scheduled with [facility] on 07/01/13 at 915 am.

## 2013-06-03 NOTE — Progress Notes (Signed)
Pulse- 88 Pt c/o lump on vagina a different lesion from last visit may be a boil

## 2013-06-03 NOTE — Progress Notes (Signed)
Pt states she is taking her BP meds and insulin regularly. Takes NPH 50/26, and regular 26 am and 15 pm. FBS: 67-155 (6/9 above range).  PP 6/7 out of range: 113-171. Only eats 2 meals a day.  Discussed importance of avoiding regular soda, sweets, carbs. Will continue same dosing since unsure of compliance with diet and insulin regimen (for example pt states she does drink sodas). Test strips refilled. To see diabetes educator next week. Fetal echo normal. Ophtho referral pending- schedule appt today. Has MFM ultrasound next week.  HTN: States taking labetalol 600 TID. No headache or vision changes. BP on high side again today.  Lesion on R labia continues to be painful. 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 Needs prophylaxis at 36 weeks.

## 2013-06-03 NOTE — Patient Instructions (Addendum)
Herpes During and After Pregnancy Genital herpes is a sexually transmitted infection (STI). It is caused by a virus and can be very serious during pregnancy. The greatest concern is passing the virus to the fetus and newborn. The virus is more likely to be passed to the newborn during delivery if the mother becomes infected for the first time late in her pregnancy (primary infection). It is less common for the virus to pass to the newborn if the mother had herpes before becoming pregnant. This is because antibodies against the virus develop over a period of time. These antibodies help protect the baby. Lastly, the infection can pass to the fetus through the placenta. This can happen if the mother gets herpes for the first time in the first 3 months of her pregnancy (first trimester). This can possibly cause a miscarriage or birth defects in the baby. TREATMENT DURING PREGNANCY Medicines may be prescribed that are safe for the mother and the fetus. The medicine can lessen symptoms or prevent a recurrence of the infection. If the infection happened before becoming pregnant, medicine may be prescribed in the last 4 weeks of the pregnancy. This can prevent a recurrent infection at the time of delivery.  RECOMMENDED DELIVERY METHOD If an active, recurrent infection is present at the time delivery, the baby should be delivered by cesarean delivery. This is because the virus can pass to the baby through an infected birth canal. This can cause severe problems for the baby. If the infection happens for the first time late in the pregnancy, the caregiver may also recommend a cesarean delivery. With a new infection, the body has not had the time to build up enough antibodies against the virus to protect the baby from getting the infection. Even if the birth canal does not have visible sores (lesions) from the herpes virus, there is still that chance that the virus can spread to the baby. A cesarean delivery should be  done if there is any signs or feeling of an infection being present in the genital area. Cesarean delivery is not recommended for women with a history of herpes infection but no evidence of active genital lesions at the time of delivery. Lesions that have crusted fully are considered healed and not active. BREASTFEEDING  Women infected with genital herpes can breastfeed their baby. The virus will not be present in the breast milk. If lesions are present on the breast, the baby should not breastfeed from the affected breast(s). HOW TO PREVENT PASSING HERPES TO YOUR BABY AFTER DELIVERY  Wash your hands with soap and water often and before touching your baby.  If you have an outbreak, keep the area clean and covered.  Try to avoid physical and stressful situations that may bring on an outbreak. SEEK IMMEDIATE MEDICAL CARE IF:   You have an outbreak during pregnancy and cannot urinate.  You have an outbreak anytime during your pregnancy and especially in the last 3 months of the pregnancy.  You think you are having an allergic reaction or side effects from the medicine you are taking. Document Released: 03/06/2001 Document Revised: 02/20/2012 Document Reviewed: 11/08/2011 [facility] Patient Information 2014 [place], [place].  Type 1 or Type 2 Diabetes Mellitus During Pregnancy Diabetes mellitus, often simply referred to as diabetes, is a long-term (chronic) disease. Type 1 diabetes occurs when the islet cells in the pancreas that make insulin (a hormone) are destroyed and can no longer make insulin. Type 2 diabetes occurs when the pancreas does not make  enough insulin, the cells are less responsive to the insulin that is made (insulin resistance), or both. Insulin is needed to move sugars from food into the tissue cells. The tissue cells use the sugars for energy. The lack of insulin or the lack of normal response to insulin causes excess sugars to build up in the blood instead of going into the  tissue cells. As a result, high blood sugar (hyperglycemia) develops.  If blood glucose levels are kept in the normal range both before and during pregnancy, women can have a healthy pregnancy. If your blood glucose levels are not well controlled, there may be risks to you, your unborn baby (fetus), your labor and delivery, or your newborn baby.  RISK FACTORS  You are predisposed to developing type 1 diabetes if someone in your family has diabetes and you are exposed to certain environmental triggers.  You have an increased chance of developing type 2 diabetes if you have a family history of diabetes and also have one or more of the following risk factors:  Being overweight.  Having an inactive lifestyle.  Having a history of consistently eating high-calorie foods. SYMPTOMS The symptoms of diabetes include:  Increased thirst (polydipsia).  Increased urination (polyuria).  Increased urination during the night (nocturia).  Weight loss. This weight loss may be rapid.  Frequent, recurring infections.  Tiredness (fatigue).  Weakness.  Vision changes, such as blurred vision.  Fruity smell to your breath.  Abdominal pain.  Nausea or vomiting. DIAGNOSIS  Diabetes is diagnosed when blood glucose levels are increased. Your blood glucose level may be checked by one or more of the following blood tests:  A fasting blood glucose test. You will not be allowed to eat for at least 8 hours before a blood sample is taken.  A random blood glucose test. Your blood glucose is checked at any time of the day regardless of when you ate.  A hemoglobin A1c blood glucose test. A hemoglobin A1c test provides information about blood glucose control over the previous 3 months.  An oral glucose tolerance test (OGTT). Your blood glucose is measured after you have not eaten (fasted) for 1 3 hours and then after you drink a glucose-containing beverage. An OGTT is usually performed during weeks 24 28  of your pregnancy. TREATMENT   You will need to take diabetes medicine or insulin daily to keep blood glucose levels in the desired range.  You will need to match insulin dosing with exercise and healthy food choices. The treatment goal is to maintain the before-meal (preprandial), bedtime, and overnight blood glucose level at 60 99 mg/dL during pregnancy. The treatment goal is to further maintain the peak after-meal blood sugar (postprandial glucose) level at 100 140 mg/dL.  HOME CARE INSTRUCTIONS   Have your hemoglobin A1c level checked twice a year.  Perform daily blood glucose monitoring as directed by your caregiver. It is common to perform frequent blood glucose monitoring.  Monitor urine ketones when you are ill and as directed by your caregiver.  Take your diabetes medicine and insulin as directed by your caregiver to maintain your blood glucose level in the desired range.  Never run out of diabetes medicine or insulin. It is needed every day.  Adjust insulin based on your intake of carbohydrates. Carbohydrates can raise blood glucose levels but need to be included in your diet. Carbohydrates provide vitamins, minerals, and fiber, which are an essential part of a healthy diet. Carbohydrates are found in fruits, vegetables, whole  grains, dairy products, legumes, and foods containing added sugars.  Eat healthy foods. Alternate 3 meals with 3 snacks.  Maintain a healthy weight gain. The usual total expected weight gain varies according to your prepregnancy body mass index (BMI).  Carry a medical alert card or wear medical alert jewelry.  Carry a 15 gram carbohydrate snack with you at all times to treat low blood sugar (hypoglycemia). Some examples of 15 gram carbohydrate snacks include:  Glucose tablets, 3 or 4.  Glucose gel, 15 gram tube.  Raisins, 2 tablespoons (24 grams).  Jelly beans, 6.  Animal crackers, 8.  Fruit juice, regular soda, or low fat milk, 4 ounces  (120 mL).  Gummy treats, 9.  Recognize hypoglycemia. Hypoglycemia during pregnancy occurs with blood glucose levels of 60 mg/dL and below. The risk for hypoglycemia increases when fasting or skipping meals, during or after intense exercise, and during sleep. Hypoglycemia symptoms can include:  Tremors or shakes.  Decreased ability to concentrate.  Sweating.  Increased heart rate.  Headache.  Dry mouth.  Hunger.  Irritability.  Anxiety.  Restless sleep.  Altered speech or coordination.  Confusion.  Treat hypoglycemia promptly. If you are alert and able to safely swallow, follow the 15:15 rule:  Take 15 20 grams of rapid-acting glucose or carbohydrate. Rapid-acting options include glucose gel, glucose tablets, or 4 ounces (120 mL) of fruit juice, regular soda, or low-fat milk.  Check your blood glucose level 15 minutes after taking the glucose.  Take 15 20 grams more of glucose if the repeat blood glucose level is still 70 mg/dL or below.  Eat a meal or snack within 1 hour once blood glucose levels return to normal.  Engage in at least 30 minutes of physical activity a day or as directed by your caregiver. Ten minutes of physical activity timed 30 minutes after each meal is encouraged to control postprandial blood glucose levels.   Be alert to polyuria and polydipsia, which are early signs of hyperglycemia. An early awareness of hyperglycemia allows for prompt treatment. Treat hyperglycemia as directed by your caregiver.  Adjust your insulin dosing and food intake as needed if you start a new exercise or sport.  Follow your sick day plan at any time you are unable to eat or drink as usual.  Avoid tobacco and alcohol use.  Follow up with your caregiver regularly.  Follow the advice of your caregiver regarding your prenatal and post-delivery (postpartum) appointments, meal planning, exercise, medicines, vitamins, blood tests, other medical tests, and physical  activities.  Continue daily skin and foot care. Examine your skin and feet daily for cuts, bruises, redness, nail problems, bleeding, blisters, or sores. A foot exam by a caregiver should be done annually.  Brush your teeth and gums at least twice a day and floss at least once a day. Follow up with your dentist regularly.  Schedule an eye exam during the first trimester of your pregnancy or as directed by your caregiver.  Share your diabetes management plan with your workplace or school.  Stay up-to-date with immunizations.  Learn to manage stress.  Obtain ongoing diabetes education and support as needed. SEEK MEDICAL CARE IF:   You are unable to eat food or drink fluids for more than 6 hours.  You have nausea and vomiting for more than 6 hours.  You have a blood glucose level of 200 mg/dL and you have ketones in your urine.  There is a change in mental status.  You develop vision problems.  You have a persistent headache.  You have upper abdominal pain or discomfort.  You develop an additional serious illness.  You have diarrhea for more than 6 hours.  You have been sick or have had a fever for a couple of days and are not getting better. SEEK IMMEDIATE MEDICAL CARE IF:  You have difficulty breathing.  You no longer feel the baby moving.  You are bleeding or have discharge from your vagina.  You start having premature contractions or labor. MAKE SURE YOU:  Understand these instructions.  Will watch your condition.  Will get help right away if you are not doing well or get worse. Document Released: 08/22/2012 Document Revised: 11/14/2012 Document Reviewed: 08/22/2012 [facility] Patient Information 2014 [place], [place].  Hypertension During Pregnancy Hypertension is also called high blood pressure. Blood pressure moves blood in your body. Sometimes, the force that moves the blood becomes too strong. When you are pregnant, this condition should be watched  carefully. It can cause problems for you and your baby. HOME CARE   Make and keep all of your doctor visits.  Take medicine as told by your doctor. Tell your doctor about all medicines you take.  Eat very little salt.  Exercise regularly.  Do not drink alcohol.  Do not smoke.  Do not have drinks with caffeine.  Lie on your left side when resting. GET HELP RIGHT AWAY IF:  You have bad belly (abdominal) pain.  You have sudden puffiness (swelling) in the hands, ankles, or face.  You gain 4 pounds (1.8 kilograms) or more in 1 week.  You throw up (vomit) repeatedly.  You have bleeding from the vagina.  You do not feel the baby moving as much.  You have a headache.  You have blurred or double vision.  You have muscle twitching or spasms.  You have shortness of breath.  You have blue fingernails and lips.  You have blood in your pee (urine). MAKE SURE YOU:  Understand these instructions.  Will watch your condition.  Will get help right away if you are not doing well. Document Released: 12/31/2010 Document Revised: 02/20/2012 Document Reviewed: 07/15/2011 [facility] Patient Information 2014 [place], [place].

## 2013-06-10 ENCOUNTER — Encounter: Payer: Medicaid Other | Admitting: Advanced Practice Midwife

## 2013-06-12 ENCOUNTER — Ambulatory Visit (HOSPITAL_COMMUNITY)
Admission: RE | Admit: 2013-06-12 | Discharge: 2013-06-12 | Disposition: A | Discharge status: Home / Self Care | Payer: Medicaid Other | Source: Ambulatory Visit | Attending: Obstetrics and Gynecology | Admitting: Obstetrics and Gynecology

## 2013-06-12 ENCOUNTER — Other Ambulatory Visit (HOSPITAL_COMMUNITY): Payer: Self-pay | Admitting: Maternal and Fetal Medicine

## 2013-06-12 VITALS — BP 145/103 | HR 81 | Wt 241.5 lb

## 2013-06-12 DIAGNOSIS — O24912 Unspecified diabetes mellitus in pregnancy, second trimester: Secondary | ICD-10-CM

## 2013-06-12 DIAGNOSIS — O10019 Pre-existing essential hypertension complicating pregnancy, unspecified trimester: Secondary | ICD-10-CM | POA: Insufficient documentation

## 2013-06-12 DIAGNOSIS — I10 Essential (primary) hypertension: Secondary | ICD-10-CM

## 2013-06-12 DIAGNOSIS — Z363 Encounter for antenatal screening for malformations: Secondary | ICD-10-CM | POA: Insufficient documentation

## 2013-06-12 DIAGNOSIS — O98519 Other viral diseases complicating pregnancy, unspecified trimester: Secondary | ICD-10-CM | POA: Insufficient documentation

## 2013-06-12 DIAGNOSIS — O0992 Supervision of high risk pregnancy, unspecified, second trimester: Secondary | ICD-10-CM

## 2013-06-12 DIAGNOSIS — A6000 Herpesviral infection of urogenital system, unspecified: Secondary | ICD-10-CM | POA: Insufficient documentation

## 2013-06-12 DIAGNOSIS — O358XX0 Maternal care for other (suspected) fetal abnormality and damage, not applicable or unspecified: Secondary | ICD-10-CM | POA: Insufficient documentation

## 2013-06-12 DIAGNOSIS — Z1389 Encounter for screening for other disorder: Secondary | ICD-10-CM | POA: Insufficient documentation

## 2013-06-12 DIAGNOSIS — O99810 Abnormal glucose complicating pregnancy: Secondary | ICD-10-CM | POA: Insufficient documentation

## 2013-06-12 DIAGNOSIS — O10012 Pre-existing essential hypertension complicating pregnancy, second trimester: Secondary | ICD-10-CM

## 2013-06-12 NOTE — Progress Notes (Signed)
Rebecca Rhodes was seen for ultrasound appointment today.  Please see [facility] report for details.

## 2013-06-12 NOTE — ED Notes (Signed)
Pt states she has been taking her blood pressure medications.  States she has been unable to check her sugars because she is unable to get strips for her meter.  Admits they were not very good when she was able to check them.

## 2013-06-24 ENCOUNTER — Encounter: Payer: Medicaid Other | Attending: Obstetrics & Gynecology | Admitting: *Deleted

## 2013-06-24 ENCOUNTER — Ambulatory Visit (INDEPENDENT_AMBULATORY_CARE_PROVIDER_SITE_OTHER): Payer: Medicaid Other | Admitting: Family Medicine

## 2013-06-24 VITALS — BP 138/98 | Temp 97.0°F | Wt 239.5 lb

## 2013-06-24 DIAGNOSIS — Z713 Dietary counseling and surveillance: Secondary | ICD-10-CM | POA: Insufficient documentation

## 2013-06-24 DIAGNOSIS — L02429 Furuncle of limb, unspecified: Secondary | ICD-10-CM | POA: Insufficient documentation

## 2013-06-24 DIAGNOSIS — O99810 Abnormal glucose complicating pregnancy: Secondary | ICD-10-CM | POA: Insufficient documentation

## 2013-06-24 DIAGNOSIS — O10019 Pre-existing essential hypertension complicating pregnancy, unspecified trimester: Secondary | ICD-10-CM

## 2013-06-24 DIAGNOSIS — Z23 Encounter for immunization: Secondary | ICD-10-CM

## 2013-06-24 DIAGNOSIS — E119 Type 2 diabetes mellitus without complications: Secondary | ICD-10-CM

## 2013-06-24 DIAGNOSIS — O10013 Pre-existing essential hypertension complicating pregnancy, third trimester: Secondary | ICD-10-CM

## 2013-06-24 LAB — CBC
HCT: 37.2 % (ref 36.0–46.0)
MCHC: 33.6 g/dL (ref 30.0–36.0)
RDW: 15.5 % (ref 11.5–15.5)
WBC: 8.8 10*3/uL (ref 4.0–10.5)

## 2013-06-24 LAB — POCT URINALYSIS DIP (DEVICE)
Bilirubin Urine: NEGATIVE
Protein, ur: 30 mg/dL — AB
Specific Gravity, Urine: 1.025 (ref 1.005–1.030)
pH: 6.5 (ref 5.0–8.0)

## 2013-06-24 MED ORDER — SULFAMETHOXAZOLE-TMP DS 800-160 MG PO TABS: 1.0000 | ORAL_TABLET | Freq: Two times a day (BID) | ORAL | Status: DC

## 2013-06-24 MED ORDER — TETANUS-DIPHTH-ACELL PERTUSSIS 5-2.5-18.5 LF-MCG/0.5 IM SUSP
0.5000 mL | Freq: Once | INTRAMUSCULAR | Status: AC
  Administered 2013-06-24: 0.5 mL via INTRAMUSCULAR

## 2013-06-24 MED ORDER — GLUCOSE BLOOD VI STRP: ORAL_STRIP | Status: DC

## 2013-06-24 MED ORDER — ACCU-CHEK FASTCLIX LANCETS MISC: 1.0000 [IU] | Freq: Four times a day (QID) | Status: DC

## 2013-06-24 MED ORDER — TETANUS-DIPHTH-ACELL PERTUSSIS 5-2.5-18.5 LF-MCG/0.5 IM SUSP: 0.5000 mL | Freq: Once | INTRAMUSCULAR | Status: DC

## 2013-06-24 NOTE — Progress Notes (Signed)
U/S on 7/2 shows EFW 18%, nml fluid and dopplers.  F/u with MFM scheduled. No BS as she is out of test strips. Indurated area on thigh with vesicle on top--? Boil--warm compresses--Septra

## 2013-06-24 NOTE — Progress Notes (Signed)
Nutrition note: f/u Pt has gained 18.5# @ [redacted]w[redacted]d, which is still > expected. Pt reports eating 1-2 meals/d due to decreased appetite and occ N. Pt has decreased soda & sweet tea to 0-1c/d and has been drinking more water. Pt does not have glucose log book today but stated that her BS was ~200 2hr after lunch and her fasting have been between 50-160.  Reviewed goals of <120 2hrs meals & 60-90 fasting. Reviewed GDM diet and encouraged pt to include a protein source with everything she eats. Encouraged pt to bring glucose log book with her to every visit. F/u in 4-6 wks Blondell Reveal, MS, RD, LDN

## 2013-06-24 NOTE — Progress Notes (Signed)
Pulse: 90 Has boil on inner thigh left side.  Needs refill on her test strips.

## 2013-06-24 NOTE — Progress Notes (Signed)
Pt unable to reveal any cbg's, as she was given the [employer] meter because she was self-pay at last visit last week..  The meter kit had only 10-15 strips, and pt ran out of strips.and did not understand that she was to come here for refill of strips (I failed to give her an extra box last week.)  Pt now has medicaid and gave her the [employer].  Demonstrated how to use using teachback method. Husband with her and voiced understanding.  Reviewed her glucose goals and time for testing and briefly reviewed # grams to include in each meal and snacks. Thank you, Lenor Coffin, RN, Diabetes CNS [phone])

## 2013-06-24 NOTE — Progress Notes (Signed)
Optho appointment rescheduled from 07/01/13 to 07/03/13 at 1:45 pm.

## 2013-06-24 NOTE — Patient Instructions (Addendum)
Abscess An abscess is an infected area that contains a collection of pus and debris.It can occur in almost any part of the body. An abscess is also known as a furuncle or boil. CAUSES  An abscess occurs when tissue gets infected. This can occur from blockage of oil or sweat glands, infection of hair follicles, or a minor injury to the skin. As the body tries to fight the infection, pus collects in the area and creates pressure under the skin. This pressure causes pain. People with weakened immune systems have difficulty fighting infections and get certain abscesses more often.  SYMPTOMS Usually an abscess develops on the skin and becomes a painful mass that is red, warm, and tender. If the abscess forms under the skin, you may feel a moveable soft area under the skin. Some abscesses break open (rupture) on their own, but most will continue to get worse without care. The infection can spread deeper into the body and eventually into the bloodstream, causing you to feel ill.  DIAGNOSIS  Your caregiver will take your medical history and perform a physical exam. A sample of fluid may also be taken from the abscess to determine what is causing your infection. TREATMENT  Your caregiver may prescribe antibiotic medicines to fight the infection. However, taking antibiotics alone usually does not cure an abscess. Your caregiver may need to make a small cut (incision) in the abscess to drain the pus. In some cases, gauze is packed into the abscess to reduce pain and to continue draining the area. HOME CARE INSTRUCTIONS   Only take over-the-counter or prescription medicines for pain, discomfort, or fever as directed by your caregiver.  If you were prescribed antibiotics, take them as directed. Finish them even if you start to feel better.  If gauze is used, follow your caregiver's directions for changing the gauze.  To avoid spreading the infection:  Keep your draining abscess covered with a  bandage.  Wash your hands well.  Do not share personal care items, towels, or whirlpools with others.  Avoid skin contact with others.  Keep your skin and clothes clean around the abscess.  Keep all follow-up appointments as directed by your caregiver. SEEK MEDICAL CARE IF:   You have increased pain, swelling, redness, fluid drainage, or bleeding.  You have muscle aches, chills, or a general ill feeling.  You have a fever. MAKE SURE YOU:   Understand these instructions.  Will watch your condition.  Will get help right away if you are not doing well or get worse. Document Released: 09/07/2005 Document Revised: 05/29/2012 Document Reviewed: 02/10/2012 [facility] Patient Information 2014 [place], [place].  Gestational Diabetes Mellitus Gestational diabetes mellitus, often simply referred to as gestational diabetes, is a type of diabetes that some women develop during pregnancy. In gestational diabetes, the pancreas does not make enough insulin (a hormone), the cells are less responsive to the insulin that is made (insulin resistance), or both.Normally, insulin moves sugars from food into the tissue cells. The tissue cells use the sugars for energy. The lack of insulin or the lack of normal response to insulin causes excess sugars to build up in the blood instead of going into the tissue cells. As a result, high blood sugar (hyperglycemia) develops. The effect of high sugar (glucose) levels can cause many complications.  RISK FACTORS You have an increased chance of developing gestational diabetes if you have a family history of diabetes and also have one or more of the following risk factors:  A  body mass index over 30 (obesity).  A previous pregnancy with gestational diabetes.  An older age at the time of pregnancy. If blood glucose levels are kept in the normal range during pregnancy, women can have a healthy pregnancy. If your blood glucose levels are not well controlled, there may  be risks to you, your unborn baby (fetus), your labor and delivery, or your newborn baby.  SYMPTOMS  If symptoms are experienced, they are much like symptoms you would normally expect during pregnancy. The symptoms of gestational diabetes include:   Increased thirst (polydipsia).  Increased urination (polyuria).  Increased urination during the night (nocturia).  Weight loss. This weight loss may be rapid.  Frequent, recurring infections.  Tiredness (fatigue).  Weakness.  Vision changes, such as blurred vision.  Fruity smell to your breath.  Abdominal pain. DIAGNOSIS Diabetes is diagnosed when blood glucose levels are increased. Your blood glucose level may be checked by one or more of the following blood tests:  A fasting blood glucose test. You will not be allowed to eat for at least 8 hours before a blood sample is taken.  A random blood glucose test. Your blood glucose is checked at any time of the day regardless of when you ate.  A hemoglobin A1c blood glucose test. A hemoglobin A1c test provides information about blood glucose control over the previous 3 months.  An oral glucose tolerance test (OGTT). Your blood glucose is measured after you have not eaten (fasted) for 1 3 hours and then after you drink a glucose-containing beverage. Since the hormones that cause insulin resistance are highest at about 24 28 weeks of a pregnancy, an OGTT is usually performed during that time. If you have risk factors for gestational diabetes, your caregiver may test you for gestational diabetes earlier than 24 weeks of pregnancy. TREATMENT   You will need to take diabetes medicine or insulin daily to keep blood glucose levels in the desired range.  You will need to match insulin dosing with exercise and healthy food choices. The treatment goal is to maintain the before meal (preprandial), bedtime, and overnight blood glucose level at 60 99 mg/dL during pregnancy. The treatment goal is to  further maintain peak after meal blood sugar (postprandial glucose) level at 100 140 mg/dL.  HOME CARE INSTRUCTIONS   Have your hemoglobin A1c level checked twice a year.  Perform daily blood glucose monitoring as directed by your caregiver. It is common to perform frequent blood glucose monitoring.  Monitor urine ketones when you are ill and as directed by your caregiver.  Take your diabetes medicine and insulin as directed by your caregiver to maintain your blood glucose level in the desired range.  Never run out of diabetes medicine or insulin. It is needed every day.  Adjust insulin based on your intake of carbohydrates. Carbohydrates can raise blood glucose levels but need to be included in your diet. Carbohydrates provide vitamins, minerals, and fiber which are an essential part of a healthy diet. Carbohydrates are found in fruits, vegetables, whole grains, dairy products, legumes, and foods containing added sugars.    Eat healthy foods. Alternate 3 meals with 3 snacks.  Maintain a healthy weight gain. The usual total expected weight gain varies according to your prepregnancy body mass index (BMI).  Carry a medical alert card or wear your medical alert jewelry.  Carry a 15 gram carbohydrate snack with you at all times to treat low blood glucose (hypoglycemia). Some examples of 15 gram carbohydrate snacks  include:  Glucose tablets, 3 or 4   Glucose gel, 15 gram tube  Raisins, 2 tablespoons (24 g)  Jelly beans, 6  Animal crackers, 8  Fruit juice, regular soda, or low fat milk, 4 ounces (120 mL)  Gummy treats, 9    Recognize hypoglycemia. Hypoglycemia during pregnancy occurs with blood glucose levels of 60 mg/dL and below. The risk for hypoglycemia increases when fasting or skipping meals, during or after intense exercise, and during sleep. Hypoglycemia symptoms can include:  Tremors or shakes.  Decreased ability to concentrate.  Sweating.  Increased heart  rate.  Headache.  Dry mouth.  Hunger.  Irritability.  Anxiety.  Restless sleep.  Altered speech or coordination.  Confusion.  Treat hypoglycemia promptly. If you are alert and able to safely swallow, follow the 15:15 rule:  Take 15 20 grams of rapid-acting glucose or carbohydrate. Rapid-acting options include glucose gel, glucose tablets, or 4 ounces (120 mL) of fruit juice, regular soda, or low fat milk.  Check your blood glucose level 15 minutes after taking the glucose.   Take 15 20 grams more of glucose if the repeat blood glucose level is still 70 mg/dL or below.  Eat a meal or snack within 1 hour once blood glucose levels return to normal.  Be alert to polyuria and polydipsia which are early signs of hyperglycemia. An early awareness of hyperglycemia allows for prompt treatment. Treat hyperglycemia as directed by your caregiver.  Engage in at least 30 minutes of physical activity a day or as directed by your caregiver. Ten minutes of physical activity timed 30 minutes after each meal is encouraged to control postprandial blood glucose levels.  Adjust your insulin dosing and food intake as needed if you start a new exercise or sport.  Follow your sick day plan at any time you are unable to eat or drink as usual.  Avoid tobacco and alcohol use.  Follow up with your caregiver regularly.  Follow the advice of your caregiver regarding your prenatal and post-delivery (postpartum) appointments, meal planning, exercise, medicines, vitamins, blood tests, other medical tests, and physical activities.  Perform daily skin and foot care. Examine your skin and feet daily for cuts, bruises, redness, nail problems, bleeding, blisters, or sores.  Brush your teeth and gums at least twice a day and floss at least once a day. Follow up with your dentist regularly.  Schedule an eye exam during the first trimester of your pregnancy or as directed by your caregiver.  Share your  diabetes management plan with your workplace or school.  Stay up-to-date with immunizations.  Learn to manage stress.  Obtain ongoing diabetes education and support as needed. SEEK MEDICAL CARE IF:   You are unable to eat food or drink fluids for more than 6 hours.  You have nausea and vomiting for more than 6 hours.  You have a blood glucose level of 200 mg/dL and you have ketones in your urine.  There is a change in mental status.  You develop vision problems.  You have a persistent headache.  You have upper abdominal pain or discomfort.  You develop an additional serious illness.  You have diarrhea for more than 6 hours.  You have been sick or have had a fever for a couple of days and are not getting better. SEEK IMMEDIATE MEDICAL CARE IF:   You have difficulty breathing.  You no longer feel the baby moving.  You are bleeding or have discharge from your vagina.  You start  having premature contractions or labor. MAKE SURE YOU:  Understand these instructions.  Will watch your condition.  Will get help right away if you are not doing well or get worse. Document Released: 03/06/2001 Document Revised: 08/22/2012 Document Reviewed: 06/26/2012 [facility] Patient Information 2014 [place], [place].  Breastfeeding A change in hormones during your pregnancy causes growth of your breast tissue and an increase in number and size of milk ducts. The hormone prolactin allows proteins, sugars, and fats from your blood supply to make breast milk in your milk-producing glands. The hormone progesterone prevents breast milk from being released before the birth of your baby. After the birth of your baby, your progesterone level decreases allowing breast milk to be released. Thoughts of your baby, as well as his or her sucking or crying, can stimulate the release of milk from the milk-producing glands. Deciding to breastfeed (nurse) is one of the best choices you can make for you and your  baby. The information that follows gives a brief review of the benefits, as well as other important skills to know about breastfeeding. BENEFITS OF BREASTFEEDING For your baby  The first milk (colostrum) helps your baby's digestive system function better.   There are antibodies in your milk that help your baby fight off infections.   Your baby has a lower incidence of asthma, allergies, and sudden infant death syndrome (SIDS).   The nutrients in breast milk are better for your baby than infant formulas.  Breast milk improves your baby's brain development.   Your baby will have less gas, colic, and constipation.  Your baby is less likely to develop other conditions, such as childhood obesity, asthma, or diabetes mellitus. For you  Breastfeeding helps develop a very special bond between you and your baby.   Breastfeeding is convenient, always available at the correct temperature, and costs nothing.   Breastfeeding helps to burn calories and helps you lose the weight gained during pregnancy.   Breastfeeding makes your uterus contract back down to normal size faster and slows bleeding following delivery.   Breastfeeding mothers have a lower risk of developing osteoporosis or breast or ovarian cancer later in life.  BREASTFEEDING FREQUENCY  A healthy, full-term baby may breastfeed as often as every hour or space his or her feedings to every 3 hours. Breastfeeding frequency will vary from baby to baby.   Newborns should be fed no less than every 2 3 hours during the day and every 4 5 hours during the night. You should breastfeed a minimum of 8 feedings in a 24 hour period.  Awaken your baby to breastfeed if it has been 3 4 hours since the last feeding.  Breastfeed when you feel the need to reduce the fullness of your breasts or when your newborn shows signs of hunger. Signs that your baby may be hungry include:  Increased alertness or activity.  Stretching.  Movement of  the head from side to side.  Movement of the head and opening of the mouth when the corner of the mouth or cheek is stroked (rooting).  Increased sucking sounds, smacking lips, cooing, sighing, or squeaking.  Hand-to-mouth movements.  Increased sucking of fingers or hands.  Fussing.  Intermittent crying.  Signs of extreme hunger will require calming and consoling before you try to feed your baby. Signs of extreme hunger may include:  Restlessness.  A loud, strong cry.  Screaming.  Frequent feeding will help you make more milk and will help prevent problems, such as sore nipples  and engorgement of the breasts.  BREASTFEEDING   Whether lying down or sitting, be sure that the baby's abdomen is facing your abdomen.   Support your breast with 4 fingers under your breast and your thumb above your nipple. Make sure your fingers are well away from your nipple and your baby's mouth.   Stroke your baby's lips gently with your finger or nipple.   When your baby's mouth is open wide enough, place all of your nipple and as much of the colored area around your nipple (areola) as possible into your baby's mouth.  More areola should be visible above his or her upper lip than below his or her lower lip.  Your baby's tongue should be between his or her lower gum and your breast.  Ensure that your baby's mouth is correctly positioned around the nipple (latched). Your baby's lips should create a seal on your breast.  Signs that your baby has effectively latched onto your nipple include:  Tugging or sucking without pain.  Swallowing heard between sucks.  Absent click or smacking sound.  Muscle movement above and in front of his or her ears with sucking.  Your baby must suck about 2 3 minutes in order to get your milk. Allow your baby to feed on each breast as long as he or she wants. Nurse your baby until he or she unlatches or falls asleep at the first breast, then offer the second  breast.  Signs that your baby is full and satisfied include:  A gradual decrease in the number of sucks or complete cessation of sucking.  Falling asleep.  Extension or relaxation of his or her body.  Retention of a small amount of milk in his or her mouth.  Letting go of your breast by himself or herself.  Signs of effective breastfeeding in you include:  Breasts that have increased firmness, weight, and size prior to feeding.  Breasts that are softer after nursing.  Increased milk volume, as well as a change in milk consistency and color by the 5th day of breastfeeding.  Breast fullness relieved by breastfeeding.  Nipples are not sore, cracked, or bleeding.  If needed, break the suction by putting your finger into the corner of your baby's mouth and sliding your finger between his or her gums. Then, remove your breast from his or her mouth.  It is common for babies to spit up a small amount after a feeding.  Babies often swallow air during feeding. This can make babies fussy. Burping your baby between breasts can help with this.  Vitamin D supplements are recommended for babies who get only breast milk.  Avoid using a pacifier during your baby's first 4 6 weeks.  Avoid supplemental feedings of water, formula, or juice in place of breastfeeding. Breast milk is all the food your baby needs. It is not necessary for your baby to have water or formula. Your breasts will make more milk if supplemental feedings are avoided during the early weeks. HOW TO TELL WHETHER YOUR BABY IS GETTING ENOUGH BREAST MILK Wondering whether or not your baby is getting enough milk is a common concern among mothers. You can be assured that your baby is getting enough milk if:   Your baby is actively sucking and you hear swallowing.   Your baby seems relaxed and satisfied after a feeding.   Your baby nurses at least 8 12 times in a 24 hour time period.  During the first 3 5 days of  age:  Your  baby is wetting at least 3 5 diapers in a 24 hour period. The urine should be clear and pale yellow.  Your baby is having at least 3 4 stools in a 24 hour period. The stool should be soft and yellow.  At 25 67 days of age, your baby is having at least 3 6 stools in a 24 hour period. The stool should be seedy and yellow by 48 days of age.  Your baby has a weight loss less than 7 10% during the first 66 days of age.  Your baby does not lose weight after 36 9 days of age.  Your baby gains 4 7 ounces each week after he or she is 17 days of age.  Your baby gains weight by 30 days of age and is back to birth weight within 2 weeks. ENGORGEMENT In the first week after your baby is born, you may experience extremely full breasts (engorgement). When engorged, your breasts may feel heavy, warm, or tender to the touch. Engorgement peaks within 24 48 hours after delivery of your baby.  Engorgement may be reduced by:  Continuing to breastfeed.  Increasing the frequency of breastfeeding.  Taking warm showers or applying warm, moist heat to your breasts just before each feeding. This increases circulation and helps the milk flow.   Gently massaging your breast before and during the feedings. With your fingertips, massage from your chest wall towards your nipple in a circular motion.   Ensuring that your baby empties at least one breast at every feeding. It also helps to start the next feeding on the opposite breast.   Expressing breast milk by hand or by using a breast pump to empty the breasts if your baby is sleepy, or not nursing well. You may also want to express milk if you are returning to work oryou feel you are getting engorged.  Ensuring your baby is latched on and positioned properly while breastfeeding. If you follow these suggestions, your engorgement should improve in 24 48 hours. If you are still experiencing difficulty, call your lactation consultant or caregiver.  CARING FOR  YOURSELF Take care of your breasts.  Bathe or shower daily.   Avoid using soap on your nipples.   Wear a supportive bra. Avoid wearing underwire style bras.  Air dry your nipples for a 3 after each feeding.   Use only cotton bra pads to absorb breast milk leakage. Leaking of breast milk between feedings is normal.   Use only pure lanolin on your nipples after nursing. You do not need to wash it off before feeding your baby again. Another option is to express a few drops of breast milk and gently massage that milk into your nipples.  Continue breast self-awareness checks. Take care of yourself.  Eat healthy foods. Alternate 3 meals with 3 snacks.  Avoid foods that you notice affect your baby in a bad way.  Drink milk, fruit juice, and water to satisfy your thirst (about 8 glasses a day).   Rest often, relax, and take your prenatal vitamins to prevent fatigue, stress, and anemia.  Avoid chewing and smoking tobacco.  Avoid alcohol and drug use.  Take over-the-counter and prescribed medicine only as directed by your caregiver or pharmacist. You should always check with your caregiver or pharmacist before taking any new medicine, vitamin, or herbal supplement.  Know that pregnancy is possible while breastfeeding. If desired, talk to your caregiver about family planning and safe birth  control methods that may be used while breastfeeding. SEEK MEDICAL CARE IF:   You feel like you want to stop breastfeeding or have become frustrated with breastfeeding.  You have painful breasts or nipples.  Your nipples are cracked or bleeding.  Your breasts are red, tender, or warm.  You have a swollen area on either breast.  You have a fever or chills.  You have nausea or vomiting.  You have drainage from your nipples.  Your breasts do not become full before feedings by the 5th day after delivery.  You feel sad and depressed.  Your baby is too sleepy to eat  well.  Your baby is having trouble sleeping.   Your baby is wetting less than 3 diapers in a 24 hour period.  Your baby has less than 3 stools in a 24 hour period.  Your baby's skin or the Vogt part of his or her eyes becomes more yellow.   Your baby is not gaining weight by 80 days of age. MAKE SURE YOU:   Understand these instructions.  Will watch your condition.  Will get help right away if you are not doing well or get worse. Document Released: 11/28/2005 Document Revised: 08/22/2012 Document Reviewed: 07/04/2012 [facility] Patient Information 2014 [place], [place].

## 2013-06-25 ENCOUNTER — Other Ambulatory Visit: Payer: Self-pay | Admitting: Family Medicine

## 2013-06-25 ENCOUNTER — Other Ambulatory Visit: Payer: Self-pay | Admitting: Obstetrics & Gynecology

## 2013-06-25 LAB — RPR

## 2013-06-25 LAB — HIV ANTIBODY (ROUTINE TESTING W REFLEX): HIV: NONREACTIVE

## 2013-07-08 ENCOUNTER — Ambulatory Visit (INDEPENDENT_AMBULATORY_CARE_PROVIDER_SITE_OTHER): Payer: Medicaid Other | Admitting: Obstetrics and Gynecology

## 2013-07-08 VITALS — BP 145/102 | Temp 97.1°F | Wt 239.4 lb

## 2013-07-08 DIAGNOSIS — O24919 Unspecified diabetes mellitus in pregnancy, unspecified trimester: Secondary | ICD-10-CM

## 2013-07-08 DIAGNOSIS — E119 Type 2 diabetes mellitus without complications: Secondary | ICD-10-CM

## 2013-07-08 DIAGNOSIS — O24912 Unspecified diabetes mellitus in pregnancy, second trimester: Secondary | ICD-10-CM

## 2013-07-08 LAB — POCT URINALYSIS DIP (DEVICE)
Glucose, UA: 500 mg/dL — AB
Leukocytes, UA: NEGATIVE
Nitrite: NEGATIVE
Protein, ur: NEGATIVE mg/dL
Specific Gravity, Urine: 1.025 (ref 1.005–1.030)
Urobilinogen, UA: 0.2 mg/dL (ref 0.0–1.0)

## 2013-07-08 MED ORDER — INSULIN NPH (HUMAN) (ISOPHANE) 100 UNIT/ML ~~LOC~~ SUSP: 50.0000 [IU] | Freq: Every day | SUBCUTANEOUS | Status: DC

## 2013-07-08 NOTE — Progress Notes (Signed)
Pt here with very little cbg readings as pt states that 'her friend' mistakenly took them home from work' and has not brought them back. Pt extremely quiet, however she can tell me her doses of insulin. Her problem seems to be with her schedule of eating.  She likes to sleep until 11am. Explained the need to check fasting glucose by 8am and then can nap and get up for snack. Encouraged pt to get on schedule. Pt also taking her Regular and NPH at night together.  Explained the need to take Reg at supper and NPH by 10pm.  Pt doesn't fee comfortable taking her supplies to work with her as she will forget to bring it home (as with her strips) Suggested she take a fanny-pack on her waist to carry her supplies and bag for snacks. Hopefully will be able to come next week if she doesn't have to work. Thank you, Lenor Coffin, RN, Diabetes CNS [phone])

## 2013-07-08 NOTE — Patient Instructions (Signed)

## 2013-07-08 NOTE — Progress Notes (Signed)
Pulse-  89 Patient still not checking blood sugars regularly

## 2013-07-08 NOTE — Progress Notes (Signed)
Has only 4 CBGs in log book. F: 112 today, 187; 7/15 pp breakfast 227; 7/22 ppdinner 208. Good FM. Drinking less coke.  Will check another 24 hr urine. [place] scheduled for 07/10/13. See Lenor Coffin who will review dosages, target values, diet.  On labetalol 600mg  BID. Discussed with Dr. Jolayne Panther increase to 600 mg TID.

## 2013-07-10 ENCOUNTER — Other Ambulatory Visit (HOSPITAL_COMMUNITY): Payer: Self-pay | Admitting: Maternal and Fetal Medicine

## 2013-07-10 ENCOUNTER — Ambulatory Visit (HOSPITAL_COMMUNITY)
Admission: RE | Admit: 2013-07-10 | Discharge: 2013-07-10 | Disposition: A | Discharge status: Home / Self Care | Payer: Medicaid Other | Source: Ambulatory Visit | Attending: Obstetrics and Gynecology | Admitting: Obstetrics and Gynecology

## 2013-07-10 DIAGNOSIS — O99810 Abnormal glucose complicating pregnancy: Secondary | ICD-10-CM | POA: Insufficient documentation

## 2013-07-10 DIAGNOSIS — O0992 Supervision of high risk pregnancy, unspecified, second trimester: Secondary | ICD-10-CM

## 2013-07-10 DIAGNOSIS — O10012 Pre-existing essential hypertension complicating pregnancy, second trimester: Secondary | ICD-10-CM

## 2013-07-10 DIAGNOSIS — A6000 Herpesviral infection of urogenital system, unspecified: Secondary | ICD-10-CM | POA: Insufficient documentation

## 2013-07-10 DIAGNOSIS — O24912 Unspecified diabetes mellitus in pregnancy, second trimester: Secondary | ICD-10-CM

## 2013-07-10 DIAGNOSIS — O365990 Maternal care for other known or suspected poor fetal growth, unspecified trimester, not applicable or unspecified: Secondary | ICD-10-CM | POA: Insufficient documentation

## 2013-07-10 DIAGNOSIS — O98519 Other viral diseases complicating pregnancy, unspecified trimester: Secondary | ICD-10-CM | POA: Insufficient documentation

## 2013-07-10 DIAGNOSIS — O10019 Pre-existing essential hypertension complicating pregnancy, unspecified trimester: Secondary | ICD-10-CM | POA: Insufficient documentation

## 2013-07-15 ENCOUNTER — Other Ambulatory Visit: Payer: Self-pay | Admitting: Obstetrics & Gynecology

## 2013-07-15 ENCOUNTER — Ambulatory Visit (INDEPENDENT_AMBULATORY_CARE_PROVIDER_SITE_OTHER): Payer: Medicaid Other | Admitting: Obstetrics & Gynecology

## 2013-07-15 ENCOUNTER — Ambulatory Visit (HOSPITAL_COMMUNITY)
Admission: RE | Admit: 2013-07-15 | Discharge: 2013-07-15 | Disposition: A | Discharge status: Home / Self Care | Payer: Medicaid Other | Source: Ambulatory Visit | Attending: Obstetrics & Gynecology | Admitting: Obstetrics & Gynecology

## 2013-07-15 ENCOUNTER — Encounter (HOSPITAL_COMMUNITY): Payer: Self-pay | Admitting: General Practice

## 2013-07-15 ENCOUNTER — Inpatient Hospital Stay (HOSPITAL_COMMUNITY)
Admission: AD | Admit: 2013-07-15 | Discharge: 2013-08-12 | DRG: 765 | Disposition: A | Discharge status: Home / Self Care | Payer: Medicaid Other | Source: Ambulatory Visit | Attending: Obstetrics & Gynecology | Admitting: Obstetrics & Gynecology

## 2013-07-15 VITALS — BP 148/98 | Temp 97.2°F | Wt 237.5 lb

## 2013-07-15 DIAGNOSIS — A6000 Herpesviral infection of urogenital system, unspecified: Secondary | ICD-10-CM | POA: Diagnosis present

## 2013-07-15 DIAGNOSIS — E119 Type 2 diabetes mellitus without complications: Secondary | ICD-10-CM

## 2013-07-15 DIAGNOSIS — I10 Essential (primary) hypertension: Secondary | ICD-10-CM

## 2013-07-15 DIAGNOSIS — O24919 Unspecified diabetes mellitus in pregnancy, unspecified trimester: Secondary | ICD-10-CM

## 2013-07-15 DIAGNOSIS — O1002 Pre-existing essential hypertension complicating childbirth: Principal | ICD-10-CM | POA: Diagnosis present

## 2013-07-15 DIAGNOSIS — O24913 Unspecified diabetes mellitus in pregnancy, third trimester: Secondary | ICD-10-CM

## 2013-07-15 DIAGNOSIS — O0993 Supervision of high risk pregnancy, unspecified, third trimester: Secondary | ICD-10-CM

## 2013-07-15 DIAGNOSIS — O10013 Pre-existing essential hypertension complicating pregnancy, third trimester: Secondary | ICD-10-CM

## 2013-07-15 DIAGNOSIS — O365990 Maternal care for other known or suspected poor fetal growth, unspecified trimester, not applicable or unspecified: Secondary | ICD-10-CM | POA: Insufficient documentation

## 2013-07-15 DIAGNOSIS — O99214 Obesity complicating childbirth: Secondary | ICD-10-CM | POA: Diagnosis present

## 2013-07-15 DIAGNOSIS — O365931 Maternal care for other known or suspected poor fetal growth, third trimester, fetus 1: Secondary | ICD-10-CM

## 2013-07-15 DIAGNOSIS — O10019 Pre-existing essential hypertension complicating pregnancy, unspecified trimester: Secondary | ICD-10-CM | POA: Insufficient documentation

## 2013-07-15 DIAGNOSIS — O98519 Other viral diseases complicating pregnancy, unspecified trimester: Secondary | ICD-10-CM | POA: Diagnosis present

## 2013-07-15 DIAGNOSIS — O2432 Unspecified pre-existing diabetes mellitus in childbirth: Secondary | ICD-10-CM | POA: Diagnosis present

## 2013-07-15 DIAGNOSIS — E669 Obesity, unspecified: Secondary | ICD-10-CM | POA: Diagnosis present

## 2013-07-15 DIAGNOSIS — O99210 Obesity complicating pregnancy, unspecified trimester: Secondary | ICD-10-CM

## 2013-07-15 HISTORY — DX: Type 2 diabetes mellitus with ketoacidosis without coma: E11.10

## 2013-07-15 HISTORY — DX: Herpesviral infection, unspecified: B00.9

## 2013-07-15 HISTORY — DX: Acute respiratory failure, unspecified whether with hypoxia or hypercapnia: J96.00

## 2013-07-15 LAB — CBC
HCT: 35.4 % — ABNORMAL LOW (ref 36.0–46.0)
Hemoglobin: 12 g/dL (ref 12.0–15.0)
MCV: 85.3 fL (ref 78.0–100.0)
RBC: 4.15 MIL/uL (ref 3.87–5.11)
WBC: 9.3 10*3/uL (ref 4.0–10.5)

## 2013-07-15 LAB — FETAL NONSTRESS TEST

## 2013-07-15 LAB — POCT URINALYSIS DIP (DEVICE)
Ketones, ur: 15 mg/dL — AB
Leukocytes, UA: NEGATIVE
Protein, ur: NEGATIVE mg/dL
pH: 5.5 (ref 5.0–8.0)

## 2013-07-15 LAB — COMPREHENSIVE METABOLIC PANEL
Albumin: 2.8 g/dL — ABNORMAL LOW (ref 3.5–5.2)
Alkaline Phosphatase: 78 U/L (ref 39–117)
BUN: 14 mg/dL (ref 6–23)
CO2: 17 mEq/L — ABNORMAL LOW (ref 19–32)
Chloride: 97 mEq/L (ref 96–112)
Creatinine, Ser: 0.93 mg/dL (ref 0.50–1.10)
GFR calc Af Amer: >90 mL/min (ref >90)
GFR calc non Af Amer: 80 mL/min — ABNORMAL LOW (ref >90)
Glucose, Bld: 472 mg/dL — ABNORMAL HIGH (ref 70–99)
Potassium: 4.3 mEq/L (ref 3.5–5.1)
Total Bilirubin: 0.3 mg/dL (ref 0.3–1.2)

## 2013-07-15 LAB — GLUCOSE, CAPILLARY
Glucose-Capillary: 327 mg/dL — ABNORMAL HIGH (ref 70–99)
Glucose-Capillary: 259 mg/dL — ABNORMAL HIGH (ref 70–99)

## 2013-07-15 MED ORDER — HYDRALAZINE HCL 20 MG/ML IJ SOLN
10.0000 mg | Freq: Once | INTRAMUSCULAR | Status: AC
  Administered 2013-07-15: 10 mg via INTRAVENOUS
  Filled 2013-07-15: qty 1

## 2013-07-15 MED ORDER — PRENATAL MULTIVITAMIN CH
1.0000 | ORAL_TABLET | Freq: Every day | ORAL | Status: DC
  Administered 2013-07-15 – 2013-08-05 (×22): 1 via ORAL
  Filled 2013-07-15 (×20): qty 1

## 2013-07-15 MED ORDER — LABETALOL HCL 300 MG PO TABS
600.0000 mg | ORAL_TABLET | Freq: Three times a day (TID) | ORAL | Status: DC
  Administered 2013-07-15 (×2): 600 mg via ORAL
  Filled 2013-07-15 (×3): qty 2

## 2013-07-15 MED ORDER — LACTATED RINGERS IV SOLN
INTRAVENOUS | Status: DC
  Administered 2013-07-15 (×2): via INTRAVENOUS

## 2013-07-15 MED ORDER — CALCIUM CARBONATE ANTACID 500 MG PO CHEW
2.0000 | CHEWABLE_TABLET | ORAL | Status: DC | PRN
  Administered 2013-07-15 – 2013-07-19 (×3): 400 mg via ORAL
  Filled 2013-07-15: qty 1
  Filled 2013-07-15 (×2): qty 2

## 2013-07-15 MED ORDER — ACETAMINOPHEN 325 MG PO TABS
650.0000 mg | ORAL_TABLET | ORAL | Status: DC | PRN
  Administered 2013-07-25: 650 mg via ORAL
  Filled 2013-07-15: qty 2

## 2013-07-15 MED ORDER — SODIUM CHLORIDE 0.9 % IV SOLN
INTRAVENOUS | Status: DC
  Administered 2013-07-15 – 2013-07-16 (×2): via INTRAVENOUS

## 2013-07-15 MED ORDER — INSULIN REGULAR HUMAN 100 UNIT/ML IJ SOLN
10.0000 [IU] | Freq: Once | INTRAMUSCULAR | Status: AC
  Administered 2013-07-15: 10 [IU] via SUBCUTANEOUS

## 2013-07-15 MED ORDER — INSULIN NPH (HUMAN) (ISOPHANE) 100 UNIT/ML ~~LOC~~ SUSP
50.0000 [IU] | Freq: Every day | SUBCUTANEOUS | Status: DC
  Administered 2013-07-16: 50 [IU] via SUBCUTANEOUS

## 2013-07-15 MED ORDER — INSULIN REGULAR HUMAN 100 UNIT/ML IJ SOLN
26.0000 [IU] | Freq: Every day | INTRAMUSCULAR | Status: DC
  Administered 2013-07-16: 26 [IU] via SUBCUTANEOUS
  Filled 2013-07-15 (×9): qty 0.26

## 2013-07-15 MED ORDER — DOCUSATE SODIUM 100 MG PO CAPS
100.0000 mg | ORAL_CAPSULE | Freq: Every day | ORAL | Status: DC
  Administered 2013-07-15 – 2013-08-06 (×22): 100 mg via ORAL
  Filled 2013-07-15 (×23): qty 1

## 2013-07-15 MED ORDER — ZOLPIDEM TARTRATE 5 MG PO TABS: 5.0000 mg | ORAL_TABLET | Freq: Every evening | ORAL | Status: DC | PRN

## 2013-07-15 MED ORDER — LABETALOL HCL 300 MG PO TABS: ORAL_TABLET | ORAL | Status: DC

## 2013-07-15 MED ORDER — INSULIN NPH (HUMAN) (ISOPHANE) 100 UNIT/ML ~~LOC~~ SUSP
15.0000 [IU] | Freq: Every day | SUBCUTANEOUS | Status: DC
  Administered 2013-07-15: 15 [IU] via SUBCUTANEOUS
  Filled 2013-07-15: qty 10

## 2013-07-15 MED ORDER — INSULIN REGULAR HUMAN 100 UNIT/ML IJ SOLN
15.0000 [IU] | Freq: Every day | INTRAMUSCULAR | Status: DC
  Administered 2013-07-15: 15 [IU] via SUBCUTANEOUS

## 2013-07-15 NOTE — Progress Notes (Signed)
Going to Radiology for ultrasound for BPP and growth now.  Will then go to [facility] for registration for admission to Antenatal.  Spoke with Dr. Mikle Bosworth, regarding preterm admission.  Dr. Mikle Bosworth states if patient delivers baby will need to be transported as they have reached the capacity of NICU here.

## 2013-07-15 NOTE — Progress Notes (Signed)
Pt is hypertensive, not checking her CBGs, and has nonreative NST with minimal to absent variability.  Pt getting BPP right now and will be admitted to antepartum.  NICU aware.

## 2013-07-15 NOTE — Progress Notes (Signed)
Pulse: 82

## 2013-07-15 NOTE — Progress Notes (Signed)
Pt has been having frequent H/A's x3 days.  She states they are relieved with rest and has not tried tylenol.  She denies visual disturbances.   Pt states her dose of Labetalol was changed last week- med list updated to reflect the new dose.   Pt has not been checking her CBG's because she had lost her test strips. She can get a refill on 8/6 and will resume checking.

## 2013-07-15 NOTE — H&P (Signed)
Rebecca Rhodes is a 32 y.o. female G1P0 @ [redacted]w[redacted]d w/ hx of CHTN and DMII-B presenting from clinic with severe range Blood pressures in clinic with intermittent headaches that improved with rest for last three days for BP monitoring and 24hr urine collection.  Pt with recent increase in labetalol to 600mg  TID. Pt otherwise has no complaints. Fetus is moving normally, no LOF, no VB, no Ctx  . Maternal Medical History:  Reason for admission: Nausea.    OB History   Grav Para Term Preterm Abortions TAB SAB Ect Mult Living   1              Past Medical History  Diagnosis Date  . Hypertension   . HTN (hypertension) 06/04/2012  . Hyperlipidemia 06/04/2012  . Diabetes mellitus   . Diabetes mellitus 06/04/2012   Past Surgical History  Procedure Laterality Date  . Irrigation and debridement abscess  10/31/2012    Procedure: IRRIGATION AND DEBRIDEMENT ABSCESS;  Surgeon: Cherylynn Ridges, MD;  Location: [facility] OR;  Service: General;  Laterality: Bilateral;   Family History: family history includes Cancer in her maternal grandfather; Heart disease in her father; Hypertension in her mother; and Stroke in her mother. Social History:  reports that she has never smoked. She has never used smokeless tobacco. She reports that she does not drink alcohol or use illicit drugs.   Prenatal Transfer Tool  Maternal Diabetes: Yes:  Diabetes Type:  Pre-pregnancy, Insulin/Medication controlled Genetic Screening: Normal Maternal Ultrasounds/Referrals: Normal but SGA 17% Fetal Ultrasounds or other Referrals:  Other: BPP 8/8 Maternal Substance Abuse:  No Significant Maternal Medications:  Meds include: Other:  labetalol, insulin Significant Maternal Lab Results:  None Other Comments:  HSV + plan to start prophylaxis at 36 wks  Review of Systems  Constitutional: Negative for fever, chills and diaphoresis.  Eyes: Negative for blurred vision and double vision.  Cardiovascular: Negative for chest pain and palpitations.   Gastrointestinal: Negative for heartburn, nausea, vomiting, abdominal pain, diarrhea and constipation.  Genitourinary: Negative for dysuria and urgency.  Neurological: Positive for headaches.      Blood pressure 168/105, pulse 75, temperature 98.3 F (36.8 C), temperature source Oral, resp. rate 18, height 5\' 4"  (1.626 m), weight 107.502 kg (237 lb), last menstrual period 12/01/2012. Exam Physical Exam  Nursing note and vitals reviewed. Constitutional: She is oriented to person, place, and time. She appears well-developed and well-nourished. No distress.  HENT:  Head: Atraumatic.  Eyes: Conjunctivae and EOM are normal.  Neck: Normal range of motion. Neck supple.  Cardiovascular: Normal rate, regular rhythm, normal heart sounds and intact distal pulses.  Exam reveals no gallop and no friction rub.   No murmur heard. Respiratory: Effort normal and breath sounds normal. No respiratory distress. She has no wheezes. She has no rales.  GI: Soft. Bowel sounds are normal. She exhibits no distension (gravid) and no mass. There is no tenderness. There is no rebound and no guarding.  Musculoskeletal: Normal range of motion.  Neurological: She is alert and oriented to person, place, and time. She has normal reflexes.  Skin: Skin is warm and dry. She is not diaphoretic.    Prenatal labs: ABO, Rh: O/POS/-- (02/24 0902) Antibody: NEG (02/24 0902) Rubella: 1.47 (02/24 0902) RPR: NON REAC (07/14 1032)  HBsAg: NEGATIVE (02/24 0902)  HIV: NON REACTIVE (07/14 1032)  GBS:   unknown  CBC    Component Value Date/Time   WBC 9.3 07/15/2013 1230   RBC 4.15 07/15/2013 1230  HGB 12.0 07/15/2013 1230   HCT 35.4* 07/15/2013 1230   PLT 268 07/15/2013 1230   MCV 85.3 07/15/2013 1230   MCH 28.9 07/15/2013 1230   MCHC 33.9 07/15/2013 1230   RDW 14.8 07/15/2013 1230   LYMPHSABS 2.4 02/04/2013 0902   MONOABS 0.7 02/04/2013 0902   EOSABS 0.1 02/04/2013 0902   BASOSABS 0.0 02/04/2013 0902    CMP     Component Value  Date/Time   NA 128* 07/15/2013 1230   K 4.3 07/15/2013 1230   CL 97 07/15/2013 1230   CO2 17* 07/15/2013 1230   GLUCOSE 472* 07/15/2013 1230   BUN 14 07/15/2013 1230   CREATININE 0.93 07/15/2013 1230   CREATININE 0.84 02/06/2013 1203   CREATININE 0.84 02/04/2013 0902   CALCIUM 9.3 07/15/2013 1230   PROT 6.6 07/15/2013 1230   ALBUMIN 2.8* 07/15/2013 1230   AST 10 07/15/2013 1230   ALT <5 07/15/2013 1230   ALKPHOS 78 07/15/2013 1230   BILITOT 0.3 07/15/2013 1230   GFRNONAA 80* 07/15/2013 1230   GFRAA >90 07/15/2013 1230      Assessment/Plan: Rebecca Rhodes is a 32 y.o. G1P0 at [redacted]w[redacted]d by L=10 #CHTN: pt with worsening HTN during pregnancy concerning for possible CHTN w/ superimposed Preeclampsia. Will collect 24hr Urine, monitor BP, and continue home labetalol - labetalol 600mg  TID - 24hr Urine - CBC/CMP reassuring - Will consider steroid course if meeting criteria for preeclampsia  #DM-2B Pt currently on NPH 50U qam, 26U qpm Humalin R 26U qam 15U qdinner Pt has not been taking her sugars at home but reports taking insulin as Rx'd. Pt was also able to describe regimen well.  - FSG Fasting, and 2hr PP - Continue home regimen - reinforced importance of checking sugars.  #pregnancy Infant with 8/8 BPP today, SGA at 17%tile this pregnancy on 2 separate [place] in July. No acute issues. 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 Continue to monitor, if deciding to induce plan to start valtrex.  Jolyn Lent, RYAN 07/15/2013, 2:07 PM

## 2013-07-15 NOTE — H&P (Signed)
Attestation of Attending Supervision of Advanced Practitioner (CNM/NP): Evaluation and management procedures were performed by the Advanced Practitioner under my supervision and collaboration.  I have reviewed the Advanced Practitioner's note and chart, and I agree with the management and plan.  Niccolo Burggraf 07/15/2013 3:37 PM

## 2013-07-16 DIAGNOSIS — O0990 Supervision of high risk pregnancy, unspecified, unspecified trimester: Secondary | ICD-10-CM

## 2013-07-16 DIAGNOSIS — O10019 Pre-existing essential hypertension complicating pregnancy, unspecified trimester: Secondary | ICD-10-CM

## 2013-07-16 LAB — GLUCOSE, CAPILLARY
Glucose-Capillary: 174 mg/dL — ABNORMAL HIGH (ref 70–99)
Glucose-Capillary: 234 mg/dL — ABNORMAL HIGH (ref 70–99)
Glucose-Capillary: 210 mg/dL — ABNORMAL HIGH (ref 70–99)
Glucose-Capillary: 140 mg/dL — ABNORMAL HIGH (ref 70–99)
Glucose-Capillary: 111 mg/dL — ABNORMAL HIGH (ref 70–99)
Glucose-Capillary: 103 mg/dL — ABNORMAL HIGH (ref 70–99)
Glucose-Capillary: 163 mg/dL — ABNORMAL HIGH (ref 70–99)
Glucose-Capillary: 171 mg/dL — ABNORMAL HIGH (ref 70–99)

## 2013-07-16 LAB — BASIC METABOLIC PANEL
Chloride: 101 mEq/L (ref 96–112)
GFR calc non Af Amer: >90 mL/min (ref >90)
Glucose, Bld: 204 mg/dL — ABNORMAL HIGH (ref 70–99)
Potassium: 3.4 mEq/L — ABNORMAL LOW (ref 3.5–5.1)
Sodium: 129 mEq/L — ABNORMAL LOW (ref 135–145)

## 2013-07-16 LAB — ABO/RH: ABO/RH(D): O POS

## 2013-07-16 LAB — RAPID URINE DRUG SCREEN, HOSP PERFORMED
Amphetamines: POSITIVE — AB
Barbiturates: NOT DETECTED

## 2013-07-16 LAB — TYPE AND SCREEN: Antibody Screen: NEGATIVE

## 2013-07-16 LAB — PROTEIN, URINE, 24 HOUR: Urine Total Volume-UPROT: 1600 mL

## 2013-07-16 MED ORDER — INSULIN REGULAR HUMAN 100 UNIT/ML IJ SOLN
INTRAMUSCULAR | Status: DC
  Administered 2013-07-16: 5.3 [IU]/h via INTRAVENOUS
  Administered 2013-07-16: 2.3 [IU]/h via INTRAVENOUS
  Administered 2013-07-16: 11:00:00 via INTRAVENOUS
  Administered 2013-07-17: 10.9 [IU]/h via INTRAVENOUS
  Administered 2013-07-17: 1.2 [IU]/h via INTRAVENOUS
  Administered 2013-07-17: 14 [IU]/h via INTRAVENOUS
  Administered 2013-07-17: 12.2 [IU]/h via INTRAVENOUS
  Administered 2013-07-17: 4.5 [IU]/h via INTRAVENOUS
  Administered 2013-07-18: 04:00:00 via INTRAVENOUS
  Filled 2013-07-16 (×4): qty 1

## 2013-07-16 MED ORDER — BETAMETHASONE SOD PHOS & ACET 6 (3-3) MG/ML IJ SUSP
12.0000 mg | Freq: Every day | INTRAMUSCULAR | Status: AC
  Administered 2013-07-16 – 2013-07-17 (×2): 12 mg via INTRAMUSCULAR
  Filled 2013-07-16 (×2): qty 2

## 2013-07-16 MED ORDER — LACTATED RINGERS IV BOLUS (SEPSIS)
500.0000 mL | Freq: Once | INTRAVENOUS | Status: AC
  Administered 2013-07-16: 1000 mL via INTRAVENOUS

## 2013-07-16 MED ORDER — LACTATED RINGERS IV SOLN
INTRAVENOUS | Status: DC
  Administered 2013-07-16: 15:00:00 via INTRAVENOUS

## 2013-07-16 MED ORDER — INSULIN REGULAR BOLUS VIA INFUSION
0.0000 [IU] | Freq: Three times a day (TID) | INTRAVENOUS | Status: DC
  Filled 2013-07-16: qty 10

## 2013-07-16 MED ORDER — DEXTROSE 50 % IV SOLN: 25.0000 mL | INTRAVENOUS | Status: DC | PRN

## 2013-07-16 MED ORDER — DEXTROSE-NACL 5-0.45 % IV SOLN: INTRAVENOUS | Status: DC

## 2013-07-16 MED ORDER — SODIUM CHLORIDE 0.9 % IV SOLN: INTRAVENOUS | Status: DC

## 2013-07-16 MED ORDER — HYDRALAZINE HCL 20 MG/ML IJ SOLN
10.0000 mg | Freq: Once | INTRAMUSCULAR | Status: AC
  Administered 2013-07-16: 10 mg via INTRAVENOUS
  Filled 2013-07-16: qty 1

## 2013-07-16 MED ORDER — LABETALOL HCL 300 MG PO TABS
800.0000 mg | ORAL_TABLET | Freq: Three times a day (TID) | ORAL | Status: DC
  Administered 2013-07-16 – 2013-08-06 (×64): 800 mg via ORAL
  Filled 2013-07-16 (×66): qty 1

## 2013-07-16 MED ORDER — INSULIN REGULAR BOLUS VIA INFUSION
0.0000 [IU] | Freq: Three times a day (TID) | INTRAVENOUS | Status: DC
  Administered 2013-07-16: 4.2 [IU] via INTRAVENOUS
  Administered 2013-07-16: 2.8 [IU] via INTRAVENOUS
  Administered 2013-07-17: 2.4 [IU] via INTRAVENOUS
  Administered 2013-07-17: 4.4 [IU] via INTRAVENOUS
  Filled 2013-07-16: qty 10

## 2013-07-16 MED ORDER — INSULIN ASPART 100 UNIT/ML ~~LOC~~ SOLN
6.0000 [IU] | Freq: Once | SUBCUTANEOUS | Status: AC
  Administered 2013-07-16: 6 [IU] via SUBCUTANEOUS

## 2013-07-16 MED ORDER — DEXTROSE-NACL 5-0.45 % IV SOLN
INTRAVENOUS | Status: DC
  Administered 2013-07-16 – 2013-07-17 (×2): via INTRAVENOUS

## 2013-07-16 NOTE — Progress Notes (Addendum)
Patient ID: Rebecca Rhodes, female   DOB: 1981-01-24, 32 y.o.   MRN: [id number] FACULTY PRACTICE ANTEPARTUM(COMPREHENSIVE) NOTE  Janelle Spellman is a 32 y.o. G1P0 [redacted]w[redacted]d  who is admitted for glycemic control and better management of chronic hypertension.    Fetal presentation is cephalic. Length of Stay:  1  Days  Date of admission:07/15/2013  Subjective: Patient is without complaints.  Patient reports the fetal movement as active. Patient reports uterine contraction  activity as none. Patient reports  vaginal bleeding as none. Patient describes fluid per vagina as None.  Vitals:  Blood pressure 147/90, pulse 78, temperature 98.2 F (36.8 C), temperature source Oral, resp. rate 18, height 5\' 4"  (1.626 m), weight 237 lb (107.502 kg), last menstrual period 12/01/2012. Filed Vitals:   07/15/13 2103 07/15/13 2113 07/15/13 2123 07/16/13 0016  BP: 158/94 154/93 143/83 147/90  Pulse: 83 90 86 78  Temp:    98.2 F (36.8 C)  TempSrc:    Oral  Resp:    18  Height:      Weight:       Physical Examination:  General appearance - alert, well appearing, and in no distress Abdomen - soft, gravid, NT Fundal Height:  size equals dates Pelvic Exam:  exam declined by the patient Cervical Exam: Not evaluated.  Extremities: no edema, redness or tenderness in the calves or thighs with DTRs 2+ bilaterally Membranes:intact  Fetal Monitoring:  Baseline: 140 bpm, Variability: Fair (1-6 bpm), Accelerations: no accels, Decelerations: Variable: mild and [place]: irregular contractions   Category II  Labs:  Results for orders placed during the hospital encounter of 07/15/13 (from the past 24 hour(s))  CBC   Collection Time    07/15/13 12:30 PM      Result Value Range   WBC 9.3  4.0 - 10.5 K/uL   RBC 4.15  3.87 - 5.11 MIL/uL   Hemoglobin 12.0  12.0 - 15.0 g/dL   HCT 78.2 (*) 95.6 - 21.3 %   MCV 85.3  78.0 - 100.0 fL   MCH 28.9  26.0 - 34.0 pg   MCHC 33.9  30.0 - 36.0 g/dL   RDW 08.6  57.8 - 46.9 %   Platelets  268  150 - 400 K/uL  COMPREHENSIVE METABOLIC PANEL   Collection Time    07/15/13 12:30 PM      Result Value Range   Sodium 128 (*) 135 - 145 mEq/L   Potassium 4.3  3.5 - 5.1 mEq/L   Chloride 97  96 - 112 mEq/L   CO2 17 (*) 19 - 32 mEq/L   Glucose, Bld 472 (*) 70 - 99 mg/dL   BUN 14  6 - 23 mg/dL   Creatinine, Ser 6.29  0.50 - 1.10 mg/dL   Calcium 9.3  8.4 - 52.8 mg/dL   Total Protein 6.6  6.0 - 8.3 g/dL   Albumin 2.8 (*) 3.5 - 5.2 g/dL   AST 10  0 - 37 U/L   ALT <5  0 - 35 U/L   Alkaline Phosphatase 78  39 - 117 U/L   Total Bilirubin 0.3  0.3 - 1.2 mg/dL   GFR calc non Af Amer 80 (*) >90 mL/min   GFR calc Af Amer >90  >90 mL/min  GLUCOSE, CAPILLARY   Collection Time    07/15/13  4:31 PM      Result Value Range   Glucose-Capillary 327 (*) 70 - 99 mg/dL   Comment 1 Documented in Chart  Comment 2 Notify RN    GLUCOSE, CAPILLARY   Collection Time    07/15/13 10:47 PM      Result Value Range   Glucose-Capillary 259 (*) 70 - 99 mg/dL  BASIC METABOLIC PANEL   Collection Time    07/16/13  5:05 AM      Result Value Range   Sodium 129 (*) 135 - 145 mEq/L   Potassium 3.4 (*) 3.5 - 5.1 mEq/L   Chloride 101  96 - 112 mEq/L   CO2 18 (*) 19 - 32 mEq/L   Glucose, Bld 204 (*) 70 - 99 mg/dL   BUN 12  6 - 23 mg/dL   Creatinine, Ser 2.95  0.50 - 1.10 mg/dL   Calcium 8.7  8.4 - 62.1 mg/dL   GFR calc non Af Amer >90  >90 mL/min   GFR calc Af Amer >90  >90 mL/min  GLUCOSE, CAPILLARY   Collection Time    07/16/13  6:36 AM      Result Value Range   Glucose-Capillary 174 (*) 70 - 99 mg/dL  POCT URINALYSIS DIP (DEVICE)   Collection Time    07/15/13 10:27 AM      Result Value Range   Glucose, UA 500 (*) NEGATIVE mg/dL   Bilirubin Urine NEGATIVE  NEGATIVE   Ketones, ur 15 (*) NEGATIVE mg/dL   Specific Gravity, Urine 1.020  1.005 - 1.030   Hgb urine dipstick NEGATIVE  NEGATIVE   pH 5.5  5.0 - 8.0   Protein, ur NEGATIVE  NEGATIVE mg/dL   Urobilinogen, UA 0.2  0.0 - 1.0 mg/dL    Nitrite NEGATIVE  NEGATIVE   Leukocytes, UA NEGATIVE  NEGATIVE    Imaging Studies:    8/4 BPP 8/8.  Medications:  Scheduled . docusate sodium  100 mg Oral Daily  . insulin NPH  15 Units Subcutaneous QHS  . insulin NPH  50 Units Subcutaneous QAC breakfast  . insulin regular  15 Units Subcutaneous QAC supper  . insulin regular  26 Units Subcutaneous QAC breakfast  . labetalol  600 mg Oral TID  . prenatal multivitamin  1 tablet Oral Q1200   I have reviewed the patient's current medications.  ASSESSMENT: Patient Active Problem List   Diagnosis Date Noted  . Diabetes mellitus, antepartum 02/18/2013    Priority: Medium  . Benign essential hypertension antepartum 02/18/2013    Priority: Medium  . Supervision of high-risk pregnancy 02/04/2013    Priority: Medium  . HTN (hypertension) 06/04/2012    Priority: Medium  . Diabetes mellitus 06/04/2012    Priority: Medium  . Boil, leg 06/24/2013  . 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 Acute respiratory failure 10/24/2012  . DKA (diabetic ketoacidoses) 06/04/2012  . Hyperlipidemia 06/04/2012    PLAN: Fetal status remains category II Fasting CBG 172. Continue close monitoring and adjusting as needed Continue monitoring BP Continue current inpatient care  CONSTANT,PEGGY 07/16/2013,7:32 AM   Discussed case with Dr. Claudean Severance, MFM.  Agrees with giving betamethasone and starting IV glucose stabilizer.  Delivery may be soon given hypertension and FHT.  FHT reassuring currently.  Will follow BP closely.   Nishanth Mccaughan H.. 9:43 AM

## 2013-07-16 NOTE — Progress Notes (Signed)
07/16/13 1400  Clinical Encounter Type  Visited With Patient and family together (boyfriend [place])  Visit Type Initial;Spiritual support;Social support  Recommendations Spiritual Care will follow for support.  Spiritual Encounters  Spiritual Needs Emotional  Stress Factors  Patient Stress Factors Major life changes;Loss of control (first baby; anxious about possible preterm delivery)   Visited with [place] and boyfriend Genevie Cheshire to introduce spiritual care and chaplain availability.  Shamanda is hopeful that she will be able to go home for some time before baby comes.  She talked about her concern for baby, especially as this is her first and a whole new experience.  She and Genevie Cheshire report good support from both of their families and were pleased to know of emotional/spiritual support available.  Provided reflective listening and encouragement.  Will continue to follow for support.  Please page as needd/desired:  [phone].  Thank you!  [address] [place], [place] [phone]

## 2013-07-16 NOTE — Progress Notes (Signed)
Report given to Aris Lot RN

## 2013-07-16 NOTE — Progress Notes (Signed)
Dr. leggett notified of fhr decels. Orders received for lr bolus and to check the pt's cervix.

## 2013-07-17 ENCOUNTER — Encounter (HOSPITAL_COMMUNITY): Payer: Self-pay

## 2013-07-17 ENCOUNTER — Inpatient Hospital Stay (HOSPITAL_COMMUNITY): Payer: Medicaid Other

## 2013-07-17 DIAGNOSIS — O139 Gestational [pregnancy-induced] hypertension without significant proteinuria, unspecified trimester: Secondary | ICD-10-CM

## 2013-07-17 DIAGNOSIS — O24919 Unspecified diabetes mellitus in pregnancy, unspecified trimester: Secondary | ICD-10-CM

## 2013-07-17 LAB — GLUCOSE, CAPILLARY
Glucose-Capillary: 119 mg/dL — ABNORMAL HIGH (ref 70–99)
Glucose-Capillary: 121 mg/dL — ABNORMAL HIGH (ref 70–99)
Glucose-Capillary: 142 mg/dL — ABNORMAL HIGH (ref 70–99)
Glucose-Capillary: 151 mg/dL — ABNORMAL HIGH (ref 70–99)
Glucose-Capillary: 158 mg/dL — ABNORMAL HIGH (ref 70–99)
Glucose-Capillary: 160 mg/dL — ABNORMAL HIGH (ref 70–99)
Glucose-Capillary: 179 mg/dL — ABNORMAL HIGH (ref 70–99)
Glucose-Capillary: 71 mg/dL (ref 70–99)
Glucose-Capillary: 81 mg/dL (ref 70–99)

## 2013-07-17 LAB — CREATININE CLEARANCE, URINE, 24 HOUR
Collection Interval-CRCL: 24 hours
Creatinine Clearance: 162 mL/min — ABNORMAL HIGH (ref 75–115)
Creatinine, 24H Ur: 1607 mg/d (ref 700–1800)
Creatinine: 0.69 mg/dL (ref 0.50–1.10)

## 2013-07-17 MED ORDER — ONDANSETRON HCL 4 MG/2ML IJ SOLN
4.0000 mg | Freq: Four times a day (QID) | INTRAMUSCULAR | Status: DC | PRN
  Administered 2013-07-17: 4 mg via INTRAVENOUS

## 2013-07-17 NOTE — Consult Note (Signed)
[facility]  Neonatal Medicine Consultation       07/17/2013    12:57 AM  I was called at the request of the patient's obstetrician (Dr. Jolayne Panther) to speak to this patient due to potential preterm delivery.  She is now 32 3/7 weeks admitted to manage her gestational diabetes and chronic hypertension.  She is getting a course of betamethasone (first dose was 07/16/13, second dose will be 07/17/13).  I discussed what would happen should she deliver in the next few days, and how we would expect her baby girl to do.  Survival should be excellent.  We would expect the baby to have some initial difficulty with breathing perhaps requiring supplemental oxygen and maybe CPAP or ventilation short-term.  Enteral feeding would be by gavage for a few weeks until she matures enough to nipple.  Mom plans to breast feed, which I encouraged.  Length of stay will most likely run until 35-37 weeks.    I spent 20 minutes reviewing her chart followed by face-to-face contact.    _____________________ Electronically Signed By: Angelita Ingles, MD Neonatologist

## 2013-07-17 NOTE — Progress Notes (Addendum)
Diabetes Treatment Program Recommendations  ADA Standards of Care 2012 Diabetes in Pregnancy Target Glucose Ranges:  Fasting: 60 - 90 mg/dL Preprandial: 60 - 213 mg/dL 1 hr postprandial: Less than 140mg /dL (from first bite of meal) 2 hr postprandial: Less than 120 mg/dL (from first bit of meal)  I am familiar with this patient from the High Risk clinic.  Pt now has medicaid and could have been switched to Novolog meal coverage. However, pt chose to stay on her R and NPH. If you would like, I can calculate her needs using NPH and Novolog for better glycemic control until delivery. Noted pt to get second dose of Betamethasone today; recommend pt stay on IV insulin drip until tomorrow and betamethasone has cleared system. Using the Diabetes in Pregnancy order set, I recommend the pt start the following regimen according to weight and gestational age.  NPH 17 units in the am and 17 units at HS  Meal coverage calculates to 1 unit/5 grams carbohydrate (Nurses can assist with this as well as patient)  Breakfast 30 gms--6 units  Lunch 45-60 grams--9 - 12 units Dinner 45-60 grams;;9-12 units  CBG schedule: Fasting am, 2 hrs pp (from first bite of meal) Correction scale used for fasting and 2 hrs pp. (not before meals)  Correction scale based on gest age/wt (goal for fasting 0800 is 60-90,   2 hrs pp is 100-120) Scale starts at (sensitivity factor of 40)      Fasting glucose only:   91-120--3 units         2 hr post-prandial:   121-160-4 units   161-200--8 units    201-240--12 units   241-281--16 units   281-320--20 units   321-360--24 units   361-400--28 units   >400--32 units and call MD  I am glad to assist with entering orders as needed.  Once pt is ready to transition off the IV insulin drip, give NPH 15 units at 8 am (2 hrs before drip is d/c'd cover breakfast per GS)   Cover glucose at time IV insulin drip is d/c'd with novolog correction insulin per above (pp scale) Then  start meal coverage per above and correction 2 hrs after lunch. Have reviewed this with RN, Sharyl Nimrod (who totally understands the regimen.) Again , i am glad to assist with entering these orders if needed. Thank you, Lenor Coffin, RN, Diabetes CNS [phone])

## 2013-07-17 NOTE — Progress Notes (Signed)
Patient ID: Merlin Ege, female   DOB: [birth year], 32 y.o.   MRN: [id number] FACULTY PRACTICE ANTEPARTUM COMPREHENSIVE PROGRESS NOTE  Kandise Riehle is a 32 y.o. G1P0 at [redacted]w[redacted]d  who is admitted for blood pressure management and glucose control.  Estimated Date of Delivery: 09/07/13 Fetal presentation is cephalic.  Length of Stay:  2 Days. 07/15/2013  Subjective: Pt feeling better today. Headaches have improved. No RUQ pain, vision changes.   Patient reports good fetal movement.  She reports no uterine contractions, no bleeding and no loss of fluid per vagina.  Vitals:  Blood pressure 157/109, pulse 82, temperature 98.1 F (36.7 C), temperature source Oral, resp. rate 20, height 5\' 4"  (1.626 m), weight 107.502 kg (237 lb), last menstrual period 12/01/2012. Physical Examination: GEN: NAD, WD, WN HEENT: PERRL PULM: good effort CV: normal pulses ABD: soft, nontender, gravid EXT: no edema, calf tenderness, 2+ DTRs  Fetal Monitoring:  Baseline: 130 bpm, Variability: Good {> 6 bpm) and Accelerations: Reactive  Results for orders placed during the hospital encounter of 07/15/13 (from the past 24 hour(s))  GLUCOSE, CAPILLARY   Collection Time    07/16/13  9:38 AM      Result Value Range   Glucose-Capillary 234 (*) 70 - 99 mg/dL   Comment 1 Documented in Chart     Comment 2 Notify RN    URINE RAPID DRUG SCREEN (HOSP PERFORMED)   Collection Time    07/16/13 10:45 AM      Result Value Range   Opiates NONE DETECTED  NONE DETECTED   Cocaine NONE DETECTED  NONE DETECTED   Benzodiazepines NONE DETECTED  NONE DETECTED   Amphetamines POSITIVE (*) NONE DETECTED   Tetrahydrocannabinol NONE DETECTED  NONE DETECTED   Barbiturates NONE DETECTED  NONE DETECTED  GLUCOSE, CAPILLARY   Collection Time    07/16/13 11:06 AM      Result Value Range   Glucose-Capillary 207 (*) 70 - 99 mg/dL   Comment 1 Documented in Chart    GLUCOSE, CAPILLARY   Collection Time    07/16/13 12:08 PM      Result Value Range    Glucose-Capillary 175 (*) 70 - 99 mg/dL  GLUCOSE, CAPILLARY   Collection Time    07/16/13  1:10 PM      Result Value Range   Glucose-Capillary 142 (*) 70 - 99 mg/dL  GLUCOSE, CAPILLARY   Collection Time    07/16/13  2:10 PM      Result Value Range   Glucose-Capillary 193 (*) 70 - 99 mg/dL  GLUCOSE, CAPILLARY   Collection Time    07/16/13  3:16 PM      Result Value Range   Glucose-Capillary 210 (*) 70 - 99 mg/dL   Comment 1 Documented in Chart    GLUCOSE, CAPILLARY   Collection Time    07/16/13  4:18 PM      Result Value Range   Glucose-Capillary 214 (*) 70 - 99 mg/dL   Comment 1 Documented in Chart    GLUCOSE, CAPILLARY   Collection Time    07/16/13  5:18 PM      Result Value Range   Glucose-Capillary 175 (*) 70 - 99 mg/dL  GLUCOSE, CAPILLARY   Collection Time    07/16/13  6:18 PM      Result Value Range   Glucose-Capillary 161 (*) 70 - 99 mg/dL   Comment 1 Documented in Chart    GLUCOSE, CAPILLARY   Collection Time    07/16/13  7:16 PM      Result Value Range   Glucose-Capillary 140 (*) 70 - 99 mg/dL  GLUCOSE, CAPILLARY   Collection Time    07/16/13  8:20 PM      Result Value Range   Glucose-Capillary 111 (*) 70 - 99 mg/dL   Comment 1 Notify RN    GLUCOSE, CAPILLARY   Collection Time    07/16/13  9:09 PM      Result Value Range   Glucose-Capillary 103 (*) 70 - 99 mg/dL   Comment 1 Notify RN    GLUCOSE, CAPILLARY   Collection Time    07/16/13 10:18 PM      Result Value Range   Glucose-Capillary 163 (*) 70 - 99 mg/dL   Comment 1 Notify RN    GLUCOSE, CAPILLARY   Collection Time    07/16/13 11:18 PM      Result Value Range   Glucose-Capillary 171 (*) 70 - 99 mg/dL   Comment 1 Notify RN    GLUCOSE, CAPILLARY   Collection Time    07/17/13 12:22 AM      Result Value Range   Glucose-Capillary 151 (*) 70 - 99 mg/dL   Comment 1 Notify RN    GLUCOSE, CAPILLARY   Collection Time    07/17/13  1:20 AM      Result Value Range   Glucose-Capillary 133 (*)  70 - 99 mg/dL   Comment 1 Notify RN    GLUCOSE, CAPILLARY   Collection Time    07/17/13  2:22 AM      Result Value Range   Glucose-Capillary 96  70 - 99 mg/dL  GLUCOSE, CAPILLARY   Collection Time    07/17/13  3:23 AM      Result Value Range   Glucose-Capillary 71  70 - 99 mg/dL   Comment 1 Notify RN    GLUCOSE, CAPILLARY   Collection Time    07/17/13  4:24 AM      Result Value Range   Glucose-Capillary 71  70 - 99 mg/dL   Comment 1 Notify RN    GLUCOSE, CAPILLARY   Collection Time    07/17/13  5:24 AM      Result Value Range   Glucose-Capillary 71  70 - 99 mg/dL   Comment 1 Notify RN    GLUCOSE, CAPILLARY   Collection Time    07/17/13  6:25 AM      Result Value Range   Glucose-Capillary 81  70 - 99 mg/dL   Comment 1 Notify RN      US Fetal Bpp W/o Non Stress  07/15/2013   OBSTETRICAL ULTRASOUND: This exam was performed within a [facility]. The OB US report was generated in the [facility] system, and faxed to the ordering physician.   This report is also available in [employer] and in the [employer]. See [facility] Obstetric US report.   . betamethasone acetate-betamethasone sodium phosphate  12 mg Intramuscular Daily  . docusate sodium  100 mg Oral Daily  . insulin regular  0-10 Units Intravenous TID WC  . labetalol  800 mg Oral TID  . prenatal multivitamin  1 tablet Oral Q1200   I have reviewed the patient's current medications.  ASSESSMENT: Patient Active Problem List   Diagnosis Date Noted  . Boil, leg 06/24/2013  . 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 Diabetes mellitus, antepartum 02/18/2013  . Benign essential hypertension antepartum 02/18/2013  . Supervision of high-risk pregnancy 02/04/2013  .  Acute respiratory failure 10/24/2012  . DKA (diabetic ketoacidoses) 06/04/2012  . HTN (hypertension) 06/04/2012  . Hyperlipidemia 06/04/2012  . Diabetes mellitus 06/04/2012    PLAN: Continue routine antenatal  care.  1) cHTN - several blood pressure in severe range - PIH labs negative, 24 hour protein of 192 - cont labetalol 800mg  TID  2) DM - initially uncontrolled, now on glucose stabilizer and much improved - fasting this AM of 81 - discuss with Diabetes care team about long term management  3) FWB - today improved to category I tracing - will discuss with MFM today as to plan for delivery - BMZ #1 given yesterday, 2nd dose this AM at 10   Rulon Abide, MD  OB Fellow Faculty Practice, [facility] [place]

## 2013-07-18 ENCOUNTER — Other Ambulatory Visit (HOSPITAL_COMMUNITY): Payer: Medicaid Other

## 2013-07-18 ENCOUNTER — Inpatient Hospital Stay (HOSPITAL_COMMUNITY): Payer: Medicaid Other

## 2013-07-18 ENCOUNTER — Ambulatory Visit (HOSPITAL_COMMUNITY): Payer: Medicaid Other

## 2013-07-18 LAB — GLUCOSE, CAPILLARY
Glucose-Capillary: 100 mg/dL — ABNORMAL HIGH (ref 70–99)
Glucose-Capillary: 103 mg/dL — ABNORMAL HIGH (ref 70–99)
Glucose-Capillary: 104 mg/dL — ABNORMAL HIGH (ref 70–99)
Glucose-Capillary: 106 mg/dL — ABNORMAL HIGH (ref 70–99)
Glucose-Capillary: 121 mg/dL — ABNORMAL HIGH (ref 70–99)
Glucose-Capillary: 127 mg/dL — ABNORMAL HIGH (ref 70–99)
Glucose-Capillary: 78 mg/dL (ref 70–99)

## 2013-07-18 MED ORDER — INSULIN ASPART 100 UNIT/ML ~~LOC~~ SOLN
0.0000 [IU] | Freq: Three times a day (TID) | SUBCUTANEOUS | Status: DC
  Administered 2013-07-18: 8 [IU] via SUBCUTANEOUS
  Administered 2013-07-19: 4 [IU] via SUBCUTANEOUS

## 2013-07-18 MED ORDER — INSULIN NPH (HUMAN) (ISOPHANE) 100 UNIT/ML ~~LOC~~ SUSP
16.0000 [IU] | Freq: Two times a day (BID) | SUBCUTANEOUS | Status: DC
  Administered 2013-07-18: 16 [IU] via SUBCUTANEOUS

## 2013-07-18 MED ORDER — INSULIN ASPART 100 UNIT/ML ~~LOC~~ SOLN
0.0000 [IU] | Freq: Three times a day (TID) | SUBCUTANEOUS | Status: DC
  Administered 2013-07-19: 8 [IU] via SUBCUTANEOUS

## 2013-07-18 MED ORDER — LACTATED RINGERS IV BOLUS (SEPSIS)
500.0000 mL | Freq: Once | INTRAVENOUS | Status: AC
  Administered 2013-07-18: 500 mL via INTRAVENOUS

## 2013-07-18 MED ORDER — INSULIN ASPART 100 UNIT/ML ~~LOC~~ SOLN
3.0000 [IU] | Freq: Three times a day (TID) | SUBCUTANEOUS | Status: DC
  Administered 2013-07-18 – 2013-07-19 (×2): 9 [IU] via SUBCUTANEOUS
  Administered 2013-07-19: 6 [IU] via SUBCUTANEOUS

## 2013-07-18 MED ORDER — NIFEDIPINE ER 30 MG PO TB24
30.0000 mg | ORAL_TABLET | Freq: Every day | ORAL | Status: DC
  Administered 2013-07-18 – 2013-07-19 (×2): 30 mg via ORAL
  Filled 2013-07-18 (×3): qty 1

## 2013-07-18 MED ORDER — INSULIN ASPART 100 UNIT/ML ~~LOC~~ SOLN: 0.0000 [IU] | Freq: Every day | SUBCUTANEOUS | Status: DC

## 2013-07-18 MED ORDER — HYDRALAZINE HCL 20 MG/ML IJ SOLN
10.0000 mg | Freq: Once | INTRAMUSCULAR | Status: AC
  Administered 2013-07-18: 10 mg via INTRAVENOUS
  Filled 2013-07-18: qty 1

## 2013-07-18 NOTE — Progress Notes (Signed)
Diabetes Treatment Program Recommendations  ADA Standards of Care 2012 Diabetes in Pregnancy Target Glucose Ranges:  Fasting: 60 - 90 mg/dL Preprandial: 60 - 161 mg/dL 1 hr postprandial: Less than 140mg /dL (from first bite of meal) 2 hr postprandial: Less than 120 mg/dL (from first bit of meal)  Entered the orders for this antenatal patient as recommended yesterday and requested to enter per Dr. Debroah Loop yesterday for today. Will follow glucose and insulin requirements and make recommendations as needed. Thanks so much, Lenor Coffin, RN, [phone])

## 2013-07-18 NOTE — Progress Notes (Signed)
Chaplain paged at 1843 arriving in unit at or about 1859.   Ms Ternes is anxious about possible early birth of her daughter Babette Relic. She fears Destiny is not developed enough to survive a C section birth. The anxiety has caused Ms Gauger to hyper ventilate and become tense in body and spirit. Her primary request was prayer for Destiny, to survive and grow. This prayer was prayed. Spiritual comfort in the form of relaxation and meditation to reduce stress and return Ms Balash to more normal breathing. Prayer for the staff and their care was also prayed.  At the time of the visit there was a notable lack of family and birth father support. This was a factor in the high anxiety. Ms Zellers feels alone and at some level abandoned. Staff comfort eases this a great deal. Ms Zolman was shy and undemanding, which means she is bearing more isolation that she needs to bear.   Recommend daytime Chaplain visits at early rounding to comfort and assure Ms Lillia Mountain.  Benjie Karvonen. Calen Geister, DMin, MDiv Chaplain

## 2013-07-18 NOTE — Progress Notes (Signed)
Rebecca Rhodes  was seen today for an ultrasound appointment.  See full report in [facility].  Impression: Single IUP at 32 5/7 weeks CHTN on Labetolol, Class B diabetes, lagging AC on previous ultrasound (< 5th %tile) with EFW at the 17th %tile. BPP 8/10 (-2 for absent breathing movement) Reactive NST (on floor) UA Dopplers - somewhat elevated, but no evidence of absent or reversed diastolic flow Normal amniotic fluid volume  Recommendations: Continue at least 2x weekly testing (at least daily NSTs while inpatient) Follow up growth scan next week.  Alpha Gula, MD

## 2013-07-18 NOTE — Progress Notes (Signed)
Patient ID: Rebecca Rhodes, female   DOB: March 24, 1981, 32 y.o.   MRN: [id number] FACULTY PRACTICE ANTEPARTUM(COMPREHENSIVE) NOTE  Rebecca Rhodes is a 32 y.o. G1P0 at [redacted]w[redacted]d by LMP who is admitted for evaluation and management of chronic hypertension and diabetes.   Fetal presentation is cephalic. Length of Stay:  3  Days  Subjective: No headache or swelling Patient reports the fetal movement as active. Patient reports uterine contraction  activity as rare. Patient reports  vaginal bleeding as none. Patient describes fluid per vagina as None.  Vitals:  Blood pressure 165/94, pulse 63, temperature 98.1 F (36.7 C), temperature source Oral, resp. rate 20, height 5\' 4"  (1.626 m), weight 247 lb 6.4 oz (112.22 kg), last menstrual period 12/01/2012. Filed Vitals:   07/18/13 0602 07/18/13 0757 07/18/13 1247 07/18/13 1253  BP: 141/89 151/94 180/104 165/94  Pulse: 72 76 71 63  Temp:  97.9 F (36.6 C) 98.1 F (36.7 C)   TempSrc:  Oral Oral   Resp:  18 20   Height:      Weight:        Physical Examination:  General appearance - alert, well appearing, and in no distress Heart - normal rate and regular rhythm Abdomen - soft, nontender, nondistended Fundal Height:  size equals dates Cervical Exam: Not evaluated. and found to be not evaluated Extremities: extremities normal, atraumatic, no cyanosis or edema  Membranes:intact  Fetal Monitoring:  Baseline: 140 bpm, decreased variability and mild variables  Labs:  Results for orders placed during the hospital encounter of 07/15/13 (from the past 24 hour(s))  GLUCOSE, CAPILLARY   Collection Time    07/17/13  2:27 PM      Result Value Range   Glucose-Capillary 127 (*) 70 - 99 mg/dL  GLUCOSE, CAPILLARY   Collection Time    07/17/13  4:13 PM      Result Value Range   Glucose-Capillary 177 (*) 70 - 99 mg/dL  GLUCOSE, CAPILLARY   Collection Time    07/17/13  5:19 PM      Result Value Range   Glucose-Capillary 179 (*) 70 - 99 mg/dL  GLUCOSE,  CAPILLARY   Collection Time    07/17/13  6:12 PM      Result Value Range   Glucose-Capillary 160 (*) 70 - 99 mg/dL  GLUCOSE, CAPILLARY   Collection Time    07/17/13  7:09 PM      Result Value Range   Glucose-Capillary 160 (*) 70 - 99 mg/dL  GLUCOSE, CAPILLARY   Collection Time    07/17/13  8:09 PM      Result Value Range   Glucose-Capillary 142 (*) 70 - 99 mg/dL  GLUCOSE, CAPILLARY   Collection Time    07/17/13  9:08 PM      Result Value Range   Glucose-Capillary 158 (*) 70 - 99 mg/dL  GLUCOSE, CAPILLARY   Collection Time    07/17/13 10:08 PM      Result Value Range   Glucose-Capillary 143 (*) 70 - 99 mg/dL  GLUCOSE, CAPILLARY   Collection Time    07/17/13 11:07 PM      Result Value Range   Glucose-Capillary 131 (*) 70 - 99 mg/dL  GLUCOSE, CAPILLARY   Collection Time    07/18/13 12:11 AM      Result Value Range   Glucose-Capillary 100 (*) 70 - 99 mg/dL  GLUCOSE, CAPILLARY   Collection Time    07/18/13  1:07 AM      Result Value Range  Glucose-Capillary 88  70 - 99 mg/dL  GLUCOSE, CAPILLARY   Collection Time    07/18/13  2:08 AM      Result Value Range   Glucose-Capillary 96  70 - 99 mg/dL  GLUCOSE, CAPILLARY   Collection Time    07/18/13  3:07 AM      Result Value Range   Glucose-Capillary 106 (*) 70 - 99 mg/dL  GLUCOSE, CAPILLARY   Collection Time    07/18/13  4:10 AM      Result Value Range   Glucose-Capillary 100 (*) 70 - 99 mg/dL  GLUCOSE, CAPILLARY   Collection Time    07/18/13  5:07 AM      Result Value Range   Glucose-Capillary 83  70 - 99 mg/dL  GLUCOSE, CAPILLARY   Collection Time    07/18/13  6:08 AM      Result Value Range   Glucose-Capillary 104 (*) 70 - 99 mg/dL  GLUCOSE, CAPILLARY   Collection Time    07/18/13  7:09 AM      Result Value Range   Glucose-Capillary 103 (*) 70 - 99 mg/dL  GLUCOSE, CAPILLARY   Collection Time    07/18/13  8:14 AM      Result Value Range   Glucose-Capillary 78  70 - 99 mg/dL  GLUCOSE, CAPILLARY    Collection Time    07/18/13  9:06 AM      Result Value Range   Glucose-Capillary 127 (*) 70 - 99 mg/dL  GLUCOSE, CAPILLARY   Collection Time    07/18/13 10:04 AM      Result Value Range   Glucose-Capillary 121 (*) 70 - 99 mg/dL  GLUCOSE, CAPILLARY   Collection Time    07/18/13 10:40 AM      Result Value Range   Glucose-Capillary 117 (*) 70 - 99 mg/dL    Imaging Studies:    BPP 6/8 today, dopplers mildly elevated and no absent flow  Medications:  Scheduled . docusate sodium  100 mg Oral Daily  . [START ON 07/19/2013] insulin aspart  0-32 Units Subcutaneous Daily  . insulin aspart  0-32 Units Subcutaneous TID  . insulin aspart  3-12 Units Subcutaneous TID WC  . labetalol  800 mg Oral TID  . NIFEdipine  30 mg Oral Daily  . prenatal multivitamin  1 tablet Oral Q1200   I have reviewed the patient's current medications.  ASSESSMENT: Patient Active Problem List   Diagnosis Date Noted  . Boil, leg 06/24/2013  . 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 Diabetes mellitus, antepartum 02/18/2013  . Benign essential hypertension antepartum 02/18/2013  . Supervision of high-risk pregnancy 02/04/2013  . Acute respiratory failure 10/24/2012  . DKA (diabetic ketoacidoses) 06/04/2012  . HTN (hypertension) 06/04/2012  . Hyperlipidemia 06/04/2012  . Diabetes mellitus 06/04/2012    PLAN: Follow BP, continuous monitoring, consider repeat BPP tomorrow.  Rebecca Rhodes 07/18/2013,2:02 PM  Reviewed Rebecca Rhodes's recommendations for glucose and started on Insuline per diabeteic recommendations.   Discussed with Dr. Harlon Flor regarding plan of care of patient. Recommend increased BP meds with procardia XL 30mg . Plan not to increase. Goal to determine if this is undertreated chronic HTN vs worsening Preeclampsia. Plan to induce labor at 34wks for severe range BPs concerning for preeclampsia. S/p BMZ and Tdap  Rebecca Scale, MD [facility] Fellow

## 2013-07-19 LAB — TYPE AND SCREEN: Antibody Screen: NEGATIVE

## 2013-07-19 LAB — GLUCOSE, CAPILLARY: Glucose-Capillary: 186 mg/dL — ABNORMAL HIGH (ref 70–99)

## 2013-07-19 MED ORDER — INSULIN ASPART 100 UNIT/ML ~~LOC~~ SOLN: 9.0000 [IU] | Freq: Once | SUBCUTANEOUS | Status: DC

## 2013-07-19 MED ORDER — INSULIN ASPART 100 UNIT/ML ~~LOC~~ SOLN
0.0000 [IU] | Freq: Three times a day (TID) | SUBCUTANEOUS | Status: DC
  Administered 2013-07-19 (×2): 8 [IU] via SUBCUTANEOUS
  Administered 2013-07-20: 12 [IU] via SUBCUTANEOUS
  Administered 2013-07-20: 4 [IU] via SUBCUTANEOUS
  Administered 2013-07-20 – 2013-07-21 (×2): 8 [IU] via SUBCUTANEOUS
  Administered 2013-07-21 – 2013-07-22 (×2): 4 [IU] via SUBCUTANEOUS
  Administered 2013-07-22: 8 [IU] via SUBCUTANEOUS
  Administered 2013-07-22: 4 [IU] via SUBCUTANEOUS
  Administered 2013-07-23: 8 [IU] via SUBCUTANEOUS
  Administered 2013-07-23 – 2013-07-24 (×2): 4 [IU] via SUBCUTANEOUS
  Administered 2013-07-24: 8 [IU] via SUBCUTANEOUS
  Administered 2013-07-25 – 2013-07-27 (×6): 4 [IU] via SUBCUTANEOUS
  Administered 2013-07-28 (×2): 8 [IU] via SUBCUTANEOUS
  Administered 2013-07-29 – 2013-08-01 (×5): 4 [IU] via SUBCUTANEOUS
  Administered 2013-08-02 – 2013-08-03 (×2): 8 [IU] via SUBCUTANEOUS
  Administered 2013-08-04 – 2013-08-05 (×3): 4 [IU] via SUBCUTANEOUS
  Administered 2013-08-05: 100 [IU] via SUBCUTANEOUS

## 2013-07-19 MED ORDER — VALACYCLOVIR HCL 500 MG PO TABS
500.0000 mg | ORAL_TABLET | Freq: Every day | ORAL | Status: DC
  Administered 2013-07-19 – 2013-08-06 (×19): 500 mg via ORAL
  Filled 2013-07-19 (×20): qty 1

## 2013-07-19 MED ORDER — INSULIN NPH (HUMAN) (ISOPHANE) 100 UNIT/ML ~~LOC~~ SUSP
16.0000 [IU] | Freq: Two times a day (BID) | SUBCUTANEOUS | Status: DC
  Administered 2013-07-19 (×2): 16 [IU] via SUBCUTANEOUS

## 2013-07-19 MED ORDER — INSULIN ASPART 100 UNIT/ML ~~LOC~~ SOLN
0.0000 [IU] | Freq: Every day | SUBCUTANEOUS | Status: DC
  Administered 2013-07-20: 2 [IU] via SUBCUTANEOUS
  Administered 2013-07-21: 0 [IU] via SUBCUTANEOUS
  Administered 2013-07-22: 2 [IU] via SUBCUTANEOUS
  Administered 2013-07-24 – 2013-07-27 (×2): 0 [IU] via SUBCUTANEOUS
  Administered 2013-07-28 – 2013-08-05 (×2): 2 [IU] via SUBCUTANEOUS

## 2013-07-19 MED ORDER — INSULIN ASPART 100 UNIT/ML ~~LOC~~ SOLN
1.0000 [IU] | Freq: Three times a day (TID) | SUBCUTANEOUS | Status: DC
  Administered 2013-07-19: 12 [IU] via SUBCUTANEOUS
  Administered 2013-07-20: 10 [IU] via SUBCUTANEOUS
  Administered 2013-07-20: 5 [IU] via SUBCUTANEOUS
  Administered 2013-07-20 – 2013-07-21 (×2): 9 [IU] via SUBCUTANEOUS
  Administered 2013-07-21: 7 [IU] via SUBCUTANEOUS
  Administered 2013-07-21: 10 [IU] via SUBCUTANEOUS
  Administered 2013-07-22: 5 [IU] via SUBCUTANEOUS

## 2013-07-19 NOTE — Progress Notes (Signed)
07/19/13 1500  Clinical Encounter Type  Visited With Patient  Visit Type Spiritual support;Social support  Spiritual Encounters  Spiritual Needs Emotional   Followed up after Chaplain Charlie Lumpkin's evening visit.  Rebecca Rhodes reports feeling "much better than yesterday"--that is, more comfortable and relaxed in general and also specifically about possible delivery around 34 weeks.  She is looking forward to having FOB stay the night and to the possibility of his mom's visiting for support later this weekend.  Per pt, having company is very meaningful and helpful to her.  Provided pastoral presence, listening, and encouragement.  Will continue to follow for support.   [address] [place], [place] [phone]

## 2013-07-19 NOTE — Progress Notes (Signed)
Patient ID: Rebecca Rhodes, female   DOB: 21-Nov-1981, 32 y.o.   MRN: [id number] FACULTY PRACTICE ANTEPARTUM COMPREHENSIVE PROGRESS NOTE  Rebecca Rhodes is a 32 y.o. G1P0 at [redacted]w[redacted]d  who is admitted for elevated BP.  Estimated Date of Delivery: 09/07/13 Fetal presentation is cephalic.  Length of Stay:  4 Days. 07/15/2013  Subjective: Pt denies complaints today.  Doing well.  Sugars better per her report.  No HA, RUQ pain, vision changes or worsening swelling.  Patient reports good fetal movement.  She reports no uterine contractions, no bleeding and no loss of fluid per vagina.  Vitals:  Blood pressure 131/84, pulse 76, temperature 98.5 F (36.9 C), temperature source Oral, resp. rate 20, height 5\' 4"  (1.626 m), weight 112.22 kg (247 lb 6.4 oz), last menstrual period 12/01/2012, SpO2 100.00%.  Filed Vitals:   07/18/13 2301 07/18/13 2303 07/18/13 2317 07/19/13 0328  BP: 151/90 123/84 134/94 131/84  Pulse: 84 90 91 76  Temp:      TempSrc:      Resp:    20  Height:      Weight:      SpO2:        Physical Examination:  General appearance - alert, well appearing, and in no distress  Heart - normal rate and regular rhythm  Abdomen - soft, nontender, nondistended  Fundal Height: size equals dates  Cervical Exam: Not evaluated.  Extremities: extremities normal, atraumatic, no cyanosis or edema , 2+ DTR Membranes:intact   Fetal Monitoring:  baseline 150s, periods of moderate and periods of minimal variabiltiy. + accels. Occasional mild variable decels  Results for orders placed during the hospital encounter of 07/15/13 (from the past 24 hour(s))  GLUCOSE, CAPILLARY   Collection Time    07/18/13  8:14 AM      Result Value Range   Glucose-Capillary 78  70 - 99 mg/dL  GLUCOSE, CAPILLARY   Collection Time    07/18/13  9:06 AM      Result Value Range   Glucose-Capillary 127 (*) 70 - 99 mg/dL  GLUCOSE, CAPILLARY   Collection Time    07/18/13 10:04 AM      Result Value Range   Glucose-Capillary 121 (*) 70 - 99 mg/dL  GLUCOSE, CAPILLARY   Collection Time    07/18/13 10:40 AM      Result Value Range   Glucose-Capillary 117 (*) 70 - 99 mg/dL  GLUCOSE, CAPILLARY   Collection Time    07/18/13  7:42 PM      Result Value Range   Glucose-Capillary 195 (*) 70 - 99 mg/dL  GLUCOSE, CAPILLARY   Collection Time    07/19/13  6:53 AM      Result Value Range   Glucose-Capillary 143 (*) 70 - 99 mg/dL   IMAGING: 12/15/76 Impression:  Single IUP at 32 5/7 weeks  CHTN on Labetolol, Class B diabetes, lagging AC on previous ultrasound (< 5th %tile) with EFW at the 17th %tile.  BPP 8/10 (-2 for absent breathing movement)  Reactive NST (on floor)  UA Dopplers - somewhat elevated, but no evidence of absent or reversed diastolic flow  Normal amniotic fluid volume       . docusate sodium  100 mg Oral Daily  . insulin aspart  0-32 Units Subcutaneous TID  . insulin aspart  0-32 Units Subcutaneous TID WC  . insulin aspart  3-12 Units Subcutaneous TID WC  . labetalol  800 mg Oral TID  . NIFEdipine  30 mg Oral Daily  .  prenatal multivitamin  1 tablet Oral Q1200   I have reviewed the patient's current medications.  ASSESSMENT: Patient Active Problem List   Diagnosis Date Noted  . Boil, leg 06/24/2013  . 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 Diabetes mellitus, antepartum 02/18/2013  . Benign essential hypertension antepartum 02/18/2013  . Supervision of high-risk pregnancy 02/04/2013  . Acute respiratory failure 10/24/2012  . DKA (diabetic ketoacidoses) 06/04/2012  . HTN (hypertension) 06/04/2012  . Hyperlipidemia 06/04/2012  . Diabetes mellitus 06/04/2012    PLAN:  1) cHTN  - several blood pressure in severe range  - PIH labs negative, 24 hour protein of 192  - cont labetalol 800mg  TID - started procardia xl 30mg  yesterday. Last night still with some high bp   2) DM  - initially uncontrolled, transitioned off glucose stabilizer and now on NPH  16U BID and carb counting with sliding scale  - fasting this AM of 143 (last night 195) - increase NPH to 20U today   3) FWB  - category II tracing with periods of category I - yesterday with BPP 8/10 and dopplers with increased flow - cont twice weekly BPP and will need growth scan next week - plan for delivery at 34 weeks - s/p BMZ x 2   Continue routine antenatal care.   Rulon Abide, MD  OB Fellow Faculty Practice, [facility]

## 2013-07-19 NOTE — Plan of Care (Signed)
Problem: Food- and Nutrition-Related Knowledge Deficit (NB-1.1) Goal: Nutrition education Formal process to instruct or train a patient/client in a skill or to impart knowledge to help patients/clients voluntarily manage or modify food choices and eating behavior to maintain or improve health. Outcome: Completed/Met Date Met:  07/19/13  Chart reviewed due to gestational diabetes diet order. Patient reports that she was diabetic prior to becoming pregnant. She took insulin then and is still on insulin. Diabetes treatment team and RN have been working with patient on diabetes diet.   RD provided gestational diabetes handout from the Academy of [employer]. Discussed different food groups and their effects on blood sugar, emphasizing carbohydrate-containing foods. Provided list of carbohydrates and recommended serving sizes of common foods.  Discussed CHO counting and CHO limits for each meal and snack. Provided sample meal plan and encouraged intake of high-fiber, whole grain complex carbohydrates. Also encouraged eating protein with each meal. Teach back method used.  Expect fair to good compliance.  Current diet order is CHO-modified gestational, patient is consuming approximately 75% of meals at this time. Labs and medications reviewed. No further nutrition interventions warranted at this time. RD contact information provided. If additional nutrition issues arise, please consult RD.  Joaquin Courts, RD, LDN, CNSC Pager [phone] After Hours Pager [phone]

## 2013-07-19 NOTE — Progress Notes (Signed)
RN notified me that the NPH orders before breakfast and at bedtime had been discontinued before the HS dose was scheduled to be given. In entering the orders per Dr. Reola Calkins from the diabetes pregnant pt order set, I apparently, accidentally discontinued the NPH orders. Pt did not get her NPH last night. Spoke with Melodye Ped from pharmacy who explained the course of events and explained how to enter enter the orders with proper scheduled.Therefore, I am unable to recommend an increase or decrease in HS NPH orders since i could not see the results of what should have been the NPH last night. I am glad to follow patient and make recommendations per the effectiveness of the scheduled doses, although Dr Reola Calkins is quite adept at doing the same assessment.  Please cal if I can be of assistance.  Thank you, Lenor Coffin, RN, Diabetes CNS [phone])

## 2013-07-20 LAB — GLUCOSE, CAPILLARY
Glucose-Capillary: 112 mg/dL — ABNORMAL HIGH (ref 70–99)
Glucose-Capillary: 105 mg/dL — ABNORMAL HIGH (ref 70–99)
Glucose-Capillary: 175 mg/dL — ABNORMAL HIGH (ref 70–99)

## 2013-07-20 MED ORDER — INSULIN NPH (HUMAN) (ISOPHANE) 100 UNIT/ML ~~LOC~~ SUSP
22.0000 [IU] | Freq: Two times a day (BID) | SUBCUTANEOUS | Status: DC
  Administered 2013-07-20 (×2): 22 [IU] via SUBCUTANEOUS

## 2013-07-20 MED ORDER — NIFEDIPINE ER 30 MG PO TB24
30.0000 mg | ORAL_TABLET | Freq: Two times a day (BID) | ORAL | Status: DC
  Administered 2013-07-20 – 2013-08-06 (×35): 30 mg via ORAL
  Filled 2013-07-20 (×35): qty 1

## 2013-07-20 MED ORDER — NIFEDIPINE 10 MG PO CAPS
ORAL_CAPSULE | ORAL | Status: AC
  Filled 2013-07-20: qty 1

## 2013-07-20 MED ORDER — NIFEDIPINE 10 MG PO CAPS: 10.0000 mg | ORAL_CAPSULE | Freq: Once | ORAL | Status: AC

## 2013-07-20 NOTE — Progress Notes (Signed)
FACULTY PRACTICE ANTEPARTUM(COMPREHENSIVE) NOTE  Rebecca Rhodes is a 32 y.o. G1P0 at [redacted]w[redacted]d by early ultrasound who is admitted for elevated BP, CHTN, Diabetes.   Fetal presentation is cephalic. Length of Stay:  5  Days  Subjective: No complaints today  Patient reports the fetal movement as active. Patient reports uterine contraction  activity as none. Patient reports  vaginal bleeding as none. Patient describes fluid per vagina as None.    Vitals:  Blood pressure 156/105, pulse 69, temperature 98.2 F (36.8 C), temperature source Oral, resp. rate 18, height 5\' 4"  (1.626 m), weight 112.22 kg (247 lb 6.4 oz), last menstrual period 12/01/2012, SpO2 100.00%.  Physical Examination:  General appearance - alert, well appearing, and in no distress and overweight Heart - normal rate and regular rhythm Abdomen - soft, nontender, nondistended Fundal Height:  size equals dates Cervical Exam: not done. Extremities: extremities normal, atraumatic, no cyanosis or edema and Homans sign is negative, no sign of DVT with DTRs 1+ bilaterally Membranes:intact  Fetal Monitoring:  Baseline: 145 bpm, Variability: Fair (1-6 bpm) and Accelerations: Non-reactive but appropriate for gestational age no decelerations at present. On continuous monitoring so far, will switch to tid(q shift) monitoring  Labs:  Results for orders placed during the hospital encounter of 07/15/13 (from the past 24 hour(s))  TYPE AND SCREEN   Collection Time    07/19/13  7:25 AM      Result Value Range   ABO/RH(D) O POS     Antibody Screen NEG     Sample Expiration 07/22/2013    GLUCOSE, CAPILLARY   Collection Time    07/19/13 10:51 AM      Result Value Range   Glucose-Capillary 186 (*) 70 - 99 mg/dL  GLUCOSE, CAPILLARY   Collection Time    07/19/13  3:00 PM      Result Value Range   Glucose-Capillary 165 (*) 70 - 99 mg/dL  GLUCOSE, CAPILLARY   Collection Time    07/19/13  9:48 PM      Result Value Range   Glucose-Capillary 192 (*) 70 - 99 mg/dL  GLUCOSE, CAPILLARY   Collection Time    07/20/13  6:15 AM      Result Value Range   Glucose-Capillary 112 (*) 70 - 99 mg/dL    IMedications:  Scheduled . docusate sodium  100 mg Oral Daily  . insulin aspart  0-32 Units Subcutaneous QAC breakfast  . insulin aspart  0-32 Units Subcutaneous TID PC  . insulin aspart  1-12 Units Subcutaneous TID WC  . insulin aspart  9-12 Units Subcutaneous Once  . insulin NPH  22 Units Subcutaneous BID AC & HS  . labetalol  800 mg Oral TID  . NIFEdipine  30 mg Oral Daily  . prenatal multivitamin  1 tablet Oral Q1200  . valACYclovir  500 mg Oral Daily   I have reviewed the patient's current medications.She received 20 units of postprandial supplemental Novolog over the Rhodes yesterday. Will increase NPH from 16/16 to 22/22.  ASSESSMENT: Patient Active Problem List   Diagnosis Date Noted  . Boil, leg 06/24/2013  . 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 Diabetes mellitus, antepartum 02/18/2013  . Benign essential hypertension antepartum 02/18/2013  . Supervision of high-risk pregnancy 02/04/2013  . Acute respiratory failure 10/24/2012  . DKA (diabetic ketoacidoses) 06/04/2012  . HTN (hypertension) 06/04/2012  . Hyperlipidemia 06/04/2012  . Diabetes mellitus 06/04/2012    PLAN: 1. DM  Increase NPH to 22/22 , continue Novolog c  meals and PC SSI 2. CHtn consider increasing Nifedipine to 30 bid. Discuss at rounds 3  Currently receiving q 72 hr type and screen updates. 4  FWB  - category I strip most of Rhodes, multiple sites with 10x 10 , no 15 x15 , no decels   Will monitor tid instead of continuous - yesterday with BPP 8/10 and dopplers with increased flow  - cont twice weekly BPP and will need growth scan next week  - plan for delivery at 34 weeks  - s/p BMZ x 2    Kiyani Jernigan V 07/20/2013,7:14 AM

## 2013-07-21 LAB — GLUCOSE, CAPILLARY
Glucose-Capillary: 178 mg/dL — ABNORMAL HIGH (ref 70–99)
Glucose-Capillary: 114 mg/dL — ABNORMAL HIGH (ref 70–99)

## 2013-07-21 MED ORDER — INSULIN NPH (HUMAN) (ISOPHANE) 100 UNIT/ML ~~LOC~~ SUSP
26.0000 [IU] | Freq: Two times a day (BID) | SUBCUTANEOUS | Status: DC
  Administered 2013-07-21 – 2013-07-23 (×5): 26 [IU] via SUBCUTANEOUS

## 2013-07-21 MED ORDER — SODIUM CHLORIDE 0.9 % IJ SOLN
3.0000 mL | Freq: Two times a day (BID) | INTRAMUSCULAR | Status: DC
  Administered 2013-07-21 – 2013-07-28 (×15): 3 mL via INTRAVENOUS

## 2013-07-21 NOTE — Progress Notes (Signed)
Patient ID: Rebecca Rhodes, female   DOB: [birth year], 32 y.o.   MRN: [id number] ACULTY PRACTICE ANTEPARTUM COMPREHENSIVE PROGRESS NOTE  Rebecca Rhodes is a 32 y.o. G1P0 at [redacted]w[redacted]d  who is admitted for elevated BP and glucose control.    Length of Stay:  6  Days  Subjective: Pt denies bleeding, pain or LOF.   Patient reports good fetal movement.  She reports no uterine contractions, no bleeding and no loss of fluid per vagina.  Vitals:  Blood pressure 149/97, pulse 79, temperature 98.2 F (36.8 C), temperature source Oral, resp. rate 18, height 5\' 4"  (1.626 m), weight 247 lb 6.4 oz (112.22 kg), last menstrual period 12/01/2012, SpO2 100.00%. Physical Examination: General appearance - alert, well appearing, and in no distress Abd: gravid, NT  Fetal Monitoring:  Baseline: 150's bpm and Variability: Fair (1-6 bpm)  Labs:  Results for orders placed during the hospital encounter of 07/15/13 (from the past 24 hour(s))  GLUCOSE, CAPILLARY   Collection Time    07/20/13  7:57 AM      Result Value Range   Glucose-Capillary 105 (*) 70 - 99 mg/dL  GLUCOSE, CAPILLARY   Collection Time    07/20/13 10:23 AM      Result Value Range   Glucose-Capillary 152 (*) 70 - 99 mg/dL  GLUCOSE, CAPILLARY   Collection Time    07/20/13  3:44 PM      Result Value Range   Glucose-Capillary 208 (*) 70 - 99 mg/dL  GLUCOSE, CAPILLARY   Collection Time    07/20/13  9:20 PM      Result Value Range   Glucose-Capillary 175 (*) 70 - 99 mg/dL    Imaging Studies:    None    Medications:  Scheduled . docusate sodium  100 mg Oral Daily  . insulin aspart  0-32 Units Subcutaneous QAC breakfast  . insulin aspart  0-32 Units Subcutaneous TID PC  . insulin aspart  1-12 Units Subcutaneous TID WC  . insulin aspart  9-12 Units Subcutaneous Once  . insulin NPH  22 Units Subcutaneous BID AC & HS  . labetalol  800 mg Oral TID  . NIFEdipine  30 mg Oral BID  . prenatal multivitamin  1 tablet Oral Q1200  . valACYclovir  500 mg  Oral Daily   I have reviewed the patient's current medications.  ASSESSMENT: Patient Active Problem List   Diagnosis Date Noted  . Boil, leg 06/24/2013  . 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 Diabetes mellitus, antepartum 02/18/2013  . Benign essential hypertension antepartum 02/18/2013  . Supervision of high-risk pregnancy 02/04/2013  . Acute respiratory failure 10/24/2012  . DKA (diabetic ketoacidoses) 06/04/2012  . HTN (hypertension) 06/04/2012  . Hyperlipidemia 06/04/2012  . Diabetes mellitus 06/04/2012    PLAN: Increase am NPH to 26 BPP in am Continue routine antenatal care.   HARRAWAY-SMITH, Jordyan Hardiman 07/21/2013,7:00 AM

## 2013-07-21 NOTE — Progress Notes (Signed)
Obstetric ultrasound performed.   Study limited to evaluation of BPP and umbilical artery Doppler measurements today.   Single IUP at 32 5/7 weeks BPP 8/10 (-2 for non reactive fetal heart rate tracing)  Normal amniotic fluid volume Normal umbilical artery Doppler measurements Cephalic presentation Posterior placental without evidence of previa or other sonographic abnormalities  Lagging fetal growth with EFW at 17th percentile on 07/10/13 and all biometric measurements at or <5th percentile  Continue close maternal and fetal surveillance as an inpatient.  If patient remains undelivered, repeat evaluation of fetal growth, amniotic fluid volume, and umbilical artery Doppler measurements recommended in 1 week.  Maintain a low threshold to manage patient as preeclamptic.    Please see full report in [facility].

## 2013-07-22 ENCOUNTER — Inpatient Hospital Stay (HOSPITAL_COMMUNITY): Payer: Medicaid Other

## 2013-07-22 ENCOUNTER — Encounter: Payer: Self-pay | Admitting: *Deleted

## 2013-07-22 LAB — COMPREHENSIVE METABOLIC PANEL
AST: 11 U/L (ref 0–37)
Albumin: 2.7 g/dL — ABNORMAL LOW (ref 3.5–5.2)
Alkaline Phosphatase: 77 U/L (ref 39–117)
Chloride: 102 mEq/L (ref 96–112)
Potassium: 4.1 mEq/L (ref 3.5–5.1)
Sodium: 132 mEq/L — ABNORMAL LOW (ref 135–145)
Total Bilirubin: 0.3 mg/dL (ref 0.3–1.2)

## 2013-07-22 LAB — GLUCOSE, CAPILLARY
Glucose-Capillary: 100 mg/dL — ABNORMAL HIGH (ref 70–99)
Glucose-Capillary: 145 mg/dL — ABNORMAL HIGH (ref 70–99)
Glucose-Capillary: 158 mg/dL — ABNORMAL HIGH (ref 70–99)

## 2013-07-22 LAB — CBC
Platelets: 221 10*3/uL (ref 150–400)
RDW: 15.3 % (ref 11.5–15.5)
WBC: 10 10*3/uL (ref 4.0–10.5)

## 2013-07-22 LAB — TYPE AND SCREEN

## 2013-07-22 MED ORDER — INSULIN ASPART 100 UNIT/ML ~~LOC~~ SOLN
10.0000 [IU] | Freq: Three times a day (TID) | SUBCUTANEOUS | Status: DC
  Administered 2013-07-22: 14 [IU] via SUBCUTANEOUS
  Administered 2013-07-22: 18 [IU] via SUBCUTANEOUS
  Administered 2013-07-23 (×2): 16 [IU] via SUBCUTANEOUS
  Administered 2013-07-23: 10 [IU] via SUBCUTANEOUS
  Administered 2013-07-24: 12 [IU] via SUBCUTANEOUS
  Administered 2013-07-24: 16 [IU] via SUBCUTANEOUS
  Administered 2013-07-24: 14 [IU] via SUBCUTANEOUS
  Administered 2013-07-25: 12 [IU] via SUBCUTANEOUS
  Administered 2013-07-25: 15 [IU] via SUBCUTANEOUS
  Administered 2013-07-25: 7 [IU] via SUBCUTANEOUS
  Administered 2013-07-26: 20 [IU] via SUBCUTANEOUS
  Administered 2013-07-26: 14 [IU] via SUBCUTANEOUS
  Administered 2013-07-26: 9 [IU] via SUBCUTANEOUS
  Administered 2013-07-27: 15 [IU] via SUBCUTANEOUS
  Administered 2013-07-27: 7 [IU] via SUBCUTANEOUS
  Administered 2013-07-27: 15 [IU] via SUBCUTANEOUS
  Administered 2013-07-28: 100 [IU] via SUBCUTANEOUS
  Administered 2013-07-28: 15 [IU] via SUBCUTANEOUS
  Administered 2013-07-28: 7 [IU] via SUBCUTANEOUS
  Administered 2013-07-29: 15 [IU] via SUBCUTANEOUS
  Administered 2013-07-29: 16 [IU] via SUBCUTANEOUS
  Administered 2013-07-29: 7 [IU] via SUBCUTANEOUS
  Administered 2013-07-30: 8 [IU] via SUBCUTANEOUS

## 2013-07-22 NOTE — Progress Notes (Signed)
Inpatient Diabetes Program Recommendations  AACE/ADA: New Consensus Statement on Inpatient Glycemic Control (2013)  Target Ranges:  Prepandial:   less than 140 mg/dL      Peak postprandial:   less than 180 mg/dL (1-2 hours)      Critically ill patients:  140 - 180 mg/dL   Post-prandial glucose levels primarily in the am and pc supper are elevated.  Recommend an increase in meal coverage, primarily in am to 1 unit per 3 grams carbohydrate rather than 1 unit per 5 grams carbohydrate.I am glad to adjust if needed. NPH doses appear to be adequate at this time. This calculates to Breakfast - 30 grams--10 units Novolog meal coverage.  Thank you, Lenor Coffin, RN, Diabetes CNS [phone])

## 2013-07-22 NOTE — Progress Notes (Signed)
OBIX Downtime; Fetal Strip in shadow chart

## 2013-07-22 NOTE — Progress Notes (Signed)
After reviewing 2 hr pp glucose and history, entered the change in orders for carbohydrate coverage to 1 unit per 3 grams carbohydrate, an increase from 1 unit per 5 grams carbohydarate. Will continue to review as neededl Thank you, Lenor Coffin, RN, Diabetes CNS [phone])

## 2013-07-22 NOTE — Progress Notes (Signed)
FACULTY PRACTICE ANTEPARTUM(COMPREHENSIVE) NOTE  Rebecca Rhodes is a 32 y.o. G1P0 at [redacted]w[redacted]d by early ultrasound who is admitted for chtn, diabetes on NPH 26/26 bid, being managed long term as inpt.   Fetal presentation is cephalic. Length of Stay:  7  Days  Subjective: No complaints today.  Had pancakes for breakfast yesterday , apparently sugared syrup Patient reports the fetal movement as active. Patient reports uterine contraction  activity as none. Patient reports  vaginal bleeding as none. Patient describes fluid per vagina as None.  Vitals:  Blood pressure 129/73, pulse 86, temperature 98.3 F (36.8 C), temperature source Oral, resp. rate 18, height 5\' 4"  (1.626 m), weight 112.22 kg (247 lb 6.4 oz), last menstrual period 12/01/2012, SpO2 100.00%. Physical Examination:  General appearance - alert, well appearing, and in no distress Heart - normal rate and regular rhythm Abdomen - soft, nontender, nondistended no abd pain Fundal Height:  size equals dates Cervical Exam: Not evaluated.  Extremities: extremities normal, atraumatic, no cyanosis or edema and Homans sign is negative, no sign of DVT with DTRs 1+ bilaterally Membranes:intact  Fetal Monitoring:  Scheduled for BPP this morning with est fetal weight, dopplers  Labs:  Results for orders placed during the hospital encounter of 07/15/13 (from the past 24 hour(s))  GLUCOSE, CAPILLARY   Collection Time    07/21/13  9:31 AM      Result Value Range   Glucose-Capillary 178 (*) 70 - 99 mg/dL   Comment 1 Notify RN    GLUCOSE, CAPILLARY   Collection Time    07/21/13  3:09 PM      Result Value Range   Glucose-Capillary 114 (*) 70 - 99 mg/dL  GLUCOSE, CAPILLARY   Collection Time    07/21/13  9:29 PM      Result Value Range   Glucose-Capillary 143 (*) 70 - 99 mg/dL  GLUCOSE, CAPILLARY   Collection Time    07/22/13  7:26 AM      Result Value Range   Glucose-Capillary 100 (*) 70 - 99 mg/dL    Imaging Studies:    As per pt,  her last BPP was Thursday. Daily q shift fetal monitoring shows low variability, occasional 10 x 10  Accels, no decels. Medications:  Scheduled . docusate sodium  100 mg Oral Daily  . insulin aspart  0-32 Units Subcutaneous QAC breakfast  . insulin aspart  0-32 Units Subcutaneous TID PC  . insulin aspart  1-12 Units Subcutaneous TID WC  . insulin aspart  9-12 Units Subcutaneous Once  . insulin NPH  26 Units Subcutaneous BID AC & HS  . labetalol  800 mg Oral TID  . NIFEdipine  30 mg Oral BID  . prenatal multivitamin  1 tablet Oral Q1200  . sodium chloride  3 mL Intravenous Q12H  . valACYclovir  500 mg Oral Daily   I have reviewed the patient's current medications. Currently 26/26 nph bid  ASSESSMENT: Patient Active Problem List   Diagnosis Date Noted  . Boil, leg 06/24/2013  . 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 Diabetes mellitus, antepartum 02/18/2013  . Benign essential hypertension antepartum 02/18/2013  . Supervision of high-risk pregnancy 02/04/2013  . Acute respiratory failure 10/24/2012  . DKA (diabetic ketoacidoses) 06/04/2012  . HTN (hypertension) 06/04/2012  . Hyperlipidemia 06/04/2012  . Diabetes mellitus 06/04/2012    PLAN: Continue inpt care  BPP this am, with dopplers. And efw this wk.   Waynette Towers V 07/22/2013,7:29 AM

## 2013-07-23 LAB — GLUCOSE, CAPILLARY
Glucose-Capillary: 84 mg/dL (ref 70–99)
Glucose-Capillary: 138 mg/dL — ABNORMAL HIGH (ref 70–99)
Glucose-Capillary: 111 mg/dL — ABNORMAL HIGH (ref 70–99)

## 2013-07-23 MED ORDER — INSULIN NPH (HUMAN) (ISOPHANE) 100 UNIT/ML ~~LOC~~ SUSP: 26.0000 [IU] | Freq: Every day | SUBCUTANEOUS | Status: DC

## 2013-07-23 MED ORDER — INSULIN NPH (HUMAN) (ISOPHANE) 100 UNIT/ML ~~LOC~~ SUSP: 28.0000 [IU] | Freq: Every day | SUBCUTANEOUS | Status: DC

## 2013-07-23 MED ORDER — SODIUM CHLORIDE 0.9 % IV SOLN
INTRAVENOUS | Status: DC
  Administered 2013-07-23: 500 mL via INTRAVENOUS

## 2013-07-23 MED ORDER — INSULIN NPH (HUMAN) (ISOPHANE) 100 UNIT/ML ~~LOC~~ SUSP
26.0000 [IU] | Freq: Every day | SUBCUTANEOUS | Status: DC
  Administered 2013-07-23 – 2013-08-05 (×14): 26 [IU] via SUBCUTANEOUS

## 2013-07-23 NOTE — Progress Notes (Signed)
Ur chart review completed.  

## 2013-07-23 NOTE — Progress Notes (Addendum)
FACULTY PRACTICE ANTEPARTUM(COMPREHENSIVE) NOTE  Jaiah Weigel is a 32 y.o. G1P0 at [redacted]w[redacted]d by LMP who is admitted for [facility] and DM on NPH 26/26 Fetal presentation is cephalic. Length of Stay:  8  Days  Subjective: No complaints today. Doing well Patient reports the fetal movement as active. Patient reports uterine contraction  activity as none. Patient reports  vaginal bleeding as scant staining on wipe x1 Patient describes fluid per vagina as None.  Vitals:  Blood pressure 149/94, pulse 85, temperature 98.2 F (36.8 C), temperature source Oral, resp. rate 18, height 5\' 4"  (1.626 m), weight 112.22 kg (247 lb 6.4 oz), last menstrual period 12/01/2012, SpO2 100.00%. Physical Examination:  General appearance - alert, well appearing, and in no distress Heart - normal rate and regular rhythm Abdomen - soft, nontender, nondistended Fundal Height:  size equals dates Cervical Exam: Not evaluated. and found to be not evaluated. Extremities: extremities normal, atraumatic, no cyanosis or edema and Homans sign is negative, no sign of DVT with DTRs 2+ bilaterally Membranes:intact  Fetal Monitoring:  Baseline: 135 bpm, Variability: Absent, Accelerations: none and Decelerations: Absent  Labs:  Results for orders placed during the hospital encounter of 07/15/13 (from the past 24 hour(s))  GLUCOSE, CAPILLARY   Collection Time    07/22/13  9:49 AM      Result Value Range   Glucose-Capillary 162 (*) 70 - 99 mg/dL  PROTEIN / CREATININE RATIO, URINE   Collection Time    07/22/13 12:15 PM      Result Value Range   Creatinine, Urine 123.53     Total Protein, Urine 11.6     PROTEIN CREATININE RATIO 0.09  0.00 - 0.15  GLUCOSE, CAPILLARY   Collection Time    07/22/13  4:15 PM      Result Value Range   Glucose-Capillary 145 (*) 70 - 99 mg/dL  GLUCOSE, CAPILLARY   Collection Time    07/22/13  9:59 PM      Result Value Range   Glucose-Capillary 158 (*) 70 - 99 mg/dL  GLUCOSE, CAPILLARY   Collection Time    07/23/13  7:12 AM      Result Value Range   Glucose-Capillary 84  70 - 99 mg/dL   Comment 1 Notify RN     Comment 2 Documented in Chart      Imaging Studies:    8/8 BPP, low normal AFI, elevated dopler, appropriate growth Medications:  Scheduled . docusate sodium  100 mg Oral Daily  . insulin aspart  0-32 Units Subcutaneous QAC breakfast  . insulin aspart  0-32 Units Subcutaneous TID PC  . insulin aspart  10-20 Units Subcutaneous TID WC  . insulin aspart  9-12 Units Subcutaneous Once  . [START ON 07/24/2013] insulin NPH  26 Units Subcutaneous QHS  . [START ON 07/24/2013] insulin NPH  28 Units Subcutaneous QAC breakfast  . labetalol  800 mg Oral TID  . NIFEdipine  30 mg Oral BID  . prenatal multivitamin  1 tablet Oral Q1200  . sodium chloride  3 mL Intravenous Q12H  . valACYclovir  500 mg Oral Daily   I have reviewed the patient's current medications.  ASSESSMENT: Denielle Bayard is a 32 y.o. G1P0 at [redacted]w[redacted]d by LMP who is admitted for [facility] and GDM.    Patient Active Problem List   Diagnosis Date Noted  . Boil, leg 06/24/2013  . 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 Diabetes mellitus, antepartum 02/18/2013  . Benign essential hypertension antepartum 02/18/2013  .  Supervision of high-risk pregnancy 02/04/2013  . Acute respiratory failure 10/24/2012  . DKA (diabetic ketoacidoses) 06/04/2012  . HTN (hypertension) 06/04/2012  . Hyperlipidemia 06/04/2012  . Diabetes mellitus 06/04/2012    PLAN: Inc AM NPH to 28, defer remaining management to DM nurse.  Continue to monitor BPs Continue current inpatient management Flat NST, give fluid bolus and reeval after snack.  Beth Goodlin, RYAN 07/23/2013,8:46 AM

## 2013-07-23 NOTE — Progress Notes (Signed)
ADA Standards of Care 2012:  Dabetes in Pregnancy Target Ranges: Fasting - 60-90 mg/dL  Preprandial- 16-109 mg/dL Postprandial < 604 mg/dL (7.8 mmol/L)     Following glucose levels, primarily high following meals.  May need increase in Meal coverage again. NPH morning dose increase by 8-10 units would be a first step at this point. Thank you, Lenor Coffin, RN, Diabetes CNS [phone])

## 2013-07-24 ENCOUNTER — Encounter: Payer: Self-pay | Admitting: *Deleted

## 2013-07-24 LAB — PROTEIN, URINE, 24 HOUR
Collection Interval-UPROT: 24 hours
Protein, 24H Urine: 96 mg/d (ref 50–100)
Protein, Urine: 4 mg/dL

## 2013-07-24 LAB — GLUCOSE, CAPILLARY

## 2013-07-24 MED ORDER — INSULIN NPH (HUMAN) (ISOPHANE) 100 UNIT/ML ~~LOC~~ SUSP: 34.0000 [IU] | Freq: Every day | SUBCUTANEOUS | Status: DC

## 2013-07-24 MED ORDER — INSULIN NPH (HUMAN) (ISOPHANE) 100 UNIT/ML ~~LOC~~ SUSP
34.0000 [IU] | Freq: Every day | SUBCUTANEOUS | Status: DC
  Administered 2013-07-24 – 2013-07-25 (×2): 34 [IU] via SUBCUTANEOUS
  Filled 2013-07-24: qty 10

## 2013-07-24 NOTE — Progress Notes (Signed)
Patient ID: Rebecca Rhodes, female   DOB: [birth year], 32 y.o.   MRN: [id number] FACULTY PRACTICE ANTEPARTUM(COMPREHENSIVE) NOTE  Rebecca Rhodes is a 32 y.o. G1P0 at [redacted]w[redacted]d by LMP who is admitted for [facility] and type B DM now on on NPH 26/26 Fetal presentation is cephalic. Length of Stay:  9  Days  Subjective: No complaints today. Doing well Patient reports the fetal movement as active. Patient reports uterine contraction  activity as none. Patient reports  vaginal bleeding as negative today Patient describes fluid per vagina as None.  Vitals:  Blood pressure 134/77, pulse 78, temperature 97.8 F (36.6 C), temperature source Oral, resp. rate 20, height 5\' 4"  (1.626 m), weight 112.22 kg (247 lb 6.4 oz), last menstrual period 12/01/2012, SpO2 100.00%. Physical Examination:  General appearance - alert, well appearing, and in no distress Heart - normal rate and regular rhythm Abdomen - soft, nontender, nondistended Fundal Height:  size equals dates Cervical Exam: Not evaluated. and found to be not evaluated. Extremities: extremities normal, atraumatic, no cyanosis or edema and Homans sign is negative, no sign of DVT with DTRs 2+ bilaterally Membranes:intact  Fetal Monitoring:  Baseline: 140 bpm, Variability: Fair (1-6 bpm), Accelerations: x1 10x10 and Decelerations: Absent  Labs:  Results for orders placed during the hospital encounter of 07/15/13 (from the past 24 hour(s))  GLUCOSE, CAPILLARY   Collection Time    07/23/13  9:52 AM      Result Value Range   Glucose-Capillary 138 (*) 70 - 99 mg/dL  GLUCOSE, CAPILLARY   Collection Time    07/23/13  2:46 PM      Result Value Range   Glucose-Capillary 111 (*) 70 - 99 mg/dL  GLUCOSE, CAPILLARY   Collection Time    07/23/13 10:05 PM      Result Value Range   Glucose-Capillary 165 (*) 70 - 99 mg/dL    Imaging Studies:    07/22/13 - 8/8 BPP, low normal AFI, elevated dopler, appropriate growth Medications:  Scheduled . docusate sodium  100 mg  Oral Daily  . insulin aspart  0-32 Units Subcutaneous QAC breakfast  . insulin aspart  0-32 Units Subcutaneous TID PC  . insulin aspart  10-20 Units Subcutaneous TID WC  . insulin aspart  9-12 Units Subcutaneous Once  . insulin NPH  26 Units Subcutaneous QHS  . insulin NPH  28 Units Subcutaneous QAC breakfast  . labetalol  800 mg Oral TID  . NIFEdipine  30 mg Oral BID  . prenatal multivitamin  1 tablet Oral Q1200  . sodium chloride  3 mL Intravenous Q12H  . valACYclovir  500 mg Oral Daily   I have reviewed the patient's current medications.  ASSESSMENT: Rebecca Rhodes is a 32 y.o. G1P0 at [redacted]w[redacted]d by LMP who is admitted for [facility] and GDM.    Patient Active Problem List   Diagnosis Date Noted  . Boil, leg 06/24/2013  . 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 Diabetes mellitus, antepartum 02/18/2013  . Benign essential hypertension antepartum 02/18/2013  . Supervision of high-risk pregnancy 02/04/2013  . Acute respiratory failure 10/24/2012  . DKA (diabetic ketoacidoses) 06/04/2012  . HTN (hypertension) 06/04/2012  . Hyperlipidemia 06/04/2012  . Diabetes mellitus 06/04/2012    PLAN: Inc AM NPH to 34U per diabetes nurse recommendation Continue to monitor BPs Continue current inpatient management Slightly improved tracing today- cat II with periods of mod var  Rebecca Rhodes L 07/24/2013,7:58 AM

## 2013-07-25 ENCOUNTER — Other Ambulatory Visit (HOSPITAL_COMMUNITY): Payer: Medicaid Other

## 2013-07-25 ENCOUNTER — Ambulatory Visit (HOSPITAL_COMMUNITY): Payer: Medicaid Other

## 2013-07-25 LAB — TYPE AND SCREEN
ABO/RH(D): O POS
Antibody Screen: NEGATIVE

## 2013-07-25 LAB — GLUCOSE, CAPILLARY: Glucose-Capillary: 155 mg/dL — ABNORMAL HIGH (ref 70–99)

## 2013-07-25 MED ORDER — SULFAMETHOXAZOLE-TMP DS 800-160 MG PO TABS: 1.0000 | ORAL_TABLET | Freq: Two times a day (BID) | ORAL | Status: DC

## 2013-07-25 MED ORDER — INSULIN NPH (HUMAN) (ISOPHANE) 100 UNIT/ML ~~LOC~~ SUSP
38.0000 [IU] | Freq: Every day | SUBCUTANEOUS | Status: DC
  Administered 2013-07-26 – 2013-08-05 (×11): 38 [IU] via SUBCUTANEOUS

## 2013-07-25 MED ORDER — CLINDAMYCIN HCL 300 MG PO CAPS
300.0000 mg | ORAL_CAPSULE | Freq: Four times a day (QID) | ORAL | Status: DC
  Administered 2013-07-25 – 2013-08-05 (×45): 300 mg via ORAL
  Filled 2013-07-25 (×49): qty 1

## 2013-07-25 NOTE — Progress Notes (Signed)
Patient ID: Rebecca Rhodes, female   DOB: 08-12-1981, 32 y.o.   MRN: [id number] Patient ID: Rebecca Rhodes, female   DOB: February 26, 1981, 33 y.o.   MRN: [id number] FACULTY PRACTICE ANTEPARTUM(COMPREHENSIVE) NOTE  Rebecca Rhodes is a 32 y.o. G1P0 at [redacted]w[redacted]d by LMP who is admitted for [facility] and type B DM now on on NPH 26/26 Fetal presentation is cephalic. Length of Stay:  10  Days  Subjective: No complaints today. Doing well Patient reports the fetal movement as active. Patient reports uterine contraction  activity as none. Patient reports  vaginal bleeding as negative today Patient describes fluid per vagina as None.  Vitals:  Blood pressure 133/93, pulse 90, temperature 98.2 F (36.8 C), temperature source Oral, resp. rate 18, height 5\' 4"  (1.626 m), weight 237 lb (107.502 kg), last menstrual period 12/01/2012, SpO2 100.00%.  Filed Vitals:   07/24/13 1948 07/24/13 1952 07/24/13 2333 07/25/13 0556  BP: 162/107 159/102 158/100 133/93  Pulse: 74 75 75 90  Temp: 98.3 F (36.8 C)  98.2 F (36.8 C)   TempSrc: Oral  Oral   Resp: 18  18 18   Height:      Weight:      SpO2:        Physical Examination:  General appearance - alert, well appearing, and in no distress Heart - normal rate and regular rhythm Abdomen - soft, nontender, nondistended Fundal Height:  size equals dates Cervical Exam: Not evaluated. and found to be not evaluated. Extremities: extremities normal, atraumatic, no cyanosis or edema and Homans sign is negative, no sign of DVT with DTRs 2+ bilaterally Membranes:intact  Fetal Monitoring:  Baseline: 140 bpm, Variability: Fair (1-6 bpm), Accelerations: x1 10x10 and Decelerations: Absent  Labs:  Results for orders placed during the hospital encounter of 07/15/13 (from the past 24 hour(s))  GLUCOSE, CAPILLARY   Collection Time    07/24/13  8:01 AM      Result Value Range   Glucose-Capillary 68 (*) 70 - 99 mg/dL   Comment 1 Notify RN     Comment 2 Documented in Chart    GLUCOSE,  CAPILLARY   Collection Time    07/24/13 10:41 AM      Result Value Range   Glucose-Capillary 137 (*) 70 - 99 mg/dL   Comment 1 Notify RN     Comment 2 Documented in Chart    GLUCOSE, CAPILLARY   Collection Time    07/24/13  3:10 PM      Result Value Range   Glucose-Capillary 178 (*) 70 - 99 mg/dL  GLUCOSE, CAPILLARY   Collection Time    07/24/13 10:23 PM      Result Value Range   Glucose-Capillary 117 (*) 70 - 99 mg/dL    Imaging Studies:    07/22/13 - 8/8 BPP, low normal AFI, elevated dopler, appropriate growth Medications:  Scheduled . docusate sodium  100 mg Oral Daily  . insulin aspart  0-32 Units Subcutaneous QAC breakfast  . insulin aspart  0-32 Units Subcutaneous TID PC  . insulin aspart  10-20 Units Subcutaneous TID WC  . insulin aspart  9-12 Units Subcutaneous Once  . insulin NPH  26 Units Subcutaneous QHS  . insulin NPH  34 Units Subcutaneous QAC breakfast  . labetalol  800 mg Oral TID  . NIFEdipine  30 mg Oral BID  . prenatal multivitamin  1 tablet Oral Q1200  . sodium chloride  3 mL Intravenous Q12H  . valACYclovir  500 mg Oral Daily  I have reviewed the patient's current medications.  ASSESSMENT: Rebecca Rhodes is a 32 y.o. G1P0 at [redacted]w[redacted]d by LMP who is admitted for [facility] and GDM.    Patient Active Problem List   Diagnosis Date Noted  . Boil, leg 06/24/2013  . 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 Diabetes mellitus, antepartum 02/18/2013  . Benign essential hypertension antepartum 02/18/2013  . Supervision of high-risk pregnancy 02/04/2013  . Acute respiratory failure 10/24/2012  . DKA (diabetic ketoacidoses) 06/04/2012  . HTN (hypertension) 06/04/2012  . Hyperlipidemia 06/04/2012  . Diabetes mellitus 06/04/2012    PLAN: Continue to pattern her insulin, improving BP control still suboptimal with lability into severe range, negative for superimposed pre eclampsia at this point BPP/Dopplers today  EURE,LUTHER H 07/25/2013,7:18 AM

## 2013-07-25 NOTE — Progress Notes (Addendum)
Diabetes in pregnancy:  Diabetes Treatment Program Recommendations  ADA Standards of Care 2012 Diabetes in Pregnancy Target Glucose Ranges:  Fasting: 60 - 90 mg/dL Preprandial: 60 - 161 mg/dL 1 hr postprandial: Less than 140mg /dL (from first bite of meal) 2 hr postprandial: Less than 120 mg/dL (from first bit of meal)  Have reviewed glucose patterns since the increase in am NPH. Now appears that meal coverage needs an adjustment as well, but she is now at 1 unit per 3 grams carbohydrate. Recommend again to increase am NPH by 4 units (since 2 hr post-prandial has required 4 units minimally)  Increase NPH am to 38 units in the am and continue the HS dose at 26 units since the fasting glucose has been well controlled. I will enter the order with a "sign and release" parameter. Will continue to follow and assist as needed.  If MD would prefer that I not enter the orders from my point of judgement with a sign and release, feel free to contact me.  Thank you, Lenor Coffin, RN, Diabetes CNS [phone])

## 2013-07-26 LAB — GLUCOSE, CAPILLARY: Glucose-Capillary: 69 mg/dL — ABNORMAL LOW (ref 70–99)

## 2013-07-26 NOTE — Progress Notes (Signed)
Pt off the monitor after reassurring FHR  

## 2013-07-26 NOTE — Progress Notes (Signed)
FACULTY PRACTICE ANTEPARTUM(COMPREHENSIVE) NOTE  Rebecca Rhodes is a 32 y.o. G1P0 at [redacted]w[redacted]d by LMP who is admitted for [facility] and type B DM now on 38/26 NPH.   Fetal presentation is cephalic. Length of Stay:  11  Days  Subjective: No complaints today doing well Patient reports the fetal movement as active. Patient reports uterine contraction  activity as none. Patient reports  vaginal bleeding as none. Patient describes fluid per vagina as None.  Vitals:  Blood pressure 152/92, pulse 75, temperature 98 F (36.7 C), temperature source Oral, resp. rate 20, height 5\' 4"  (1.626 m), weight 107.502 kg (237 lb), last menstrual period 12/01/2012, SpO2 100.00%.  Filed Vitals:   07/25/13 1314 07/25/13 1603 07/25/13 2023 07/25/13 2258  BP: 109/66 143/90 145/96 152/92  Pulse: 79 78 79 75  Temp: 98.3 F (36.8 C) 98.5 F (36.9 C) 98.2 F (36.8 C) 98 F (36.7 C)  TempSrc: Oral Oral Oral Oral  Resp: 18 18 20 20   Height:      Weight:      SpO2:        Physical Examination:  General appearance - alert, well appearing, and in no distress and oriented to person, place, and time Heart - normal rate and regular rhythm Abdomen - soft, nontender, nondistended Fundal Height:  size equals dates Cervical Exam: Not evaluated. and found to be / / and fetal presentation is . Extremities: extremities normal, atraumatic, no cyanosis or edema and Homans sign is negative, no sign of DVT with DTRs 2+ bilaterally Membranes:intact  Fetal Monitoring:  Baseline: 140s bpm, Variability: Good {> 6 bpm), Accelerations: Reactive and Decelerations: Absent  Labs:  Results for orders placed during the hospital encounter of 07/15/13 (from the past 24 hour(s))  GLUCOSE, CAPILLARY   Collection Time    07/25/13  7:57 AM      Result Value Range   Glucose-Capillary 79  70 - 99 mg/dL   Comment 1 Documented in Chart     Comment 2 Notify RN    GLUCOSE, CAPILLARY   Collection Time    07/25/13 10:17 AM      Result Value  Range   Glucose-Capillary 127 (*) 70 - 99 mg/dL  GLUCOSE, CAPILLARY   Collection Time    07/25/13  4:04 PM      Result Value Range   Glucose-Capillary 155 (*) 70 - 99 mg/dL  GLUCOSE, CAPILLARY   Collection Time    07/25/13 10:12 PM      Result Value Range   Glucose-Capillary 135 (*) 70 - 99 mg/dL    Imaging Studies:    8/14 8/8 BPP  Medications:  Scheduled . clindamycin  300 mg Oral Q6H  . docusate sodium  100 mg Oral Daily  . insulin aspart  0-32 Units Subcutaneous QAC breakfast  . insulin aspart  0-32 Units Subcutaneous TID PC  . insulin aspart  10-20 Units Subcutaneous TID WC  . insulin aspart  9-12 Units Subcutaneous Once  . insulin NPH  26 Units Subcutaneous QHS  . insulin NPH  38 Units Subcutaneous QAC breakfast  . labetalol  800 mg Oral TID  . NIFEdipine  30 mg Oral BID  . prenatal multivitamin  1 tablet Oral Q1200  . sodium chloride  3 mL Intravenous Q12H  . valACYclovir  500 mg Oral Daily   I have reviewed the patient's current medications.  ASSESSMENT: Rebecca Rhodes is a 32 y.o. G1P0 at [redacted]w[redacted]d by LMP who is admitted for [facility] and Type B  DM.    Patient Active Problem List   Diagnosis Date Noted  . Boil, leg 06/24/2013  . 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 Diabetes mellitus, antepartum 02/18/2013  . Benign essential hypertension antepartum 02/18/2013  . Supervision of high-risk pregnancy 02/04/2013  . Acute respiratory failure 10/24/2012  . DKA (diabetic ketoacidoses) 06/04/2012  . HTN (hypertension) 06/04/2012  . Hyperlipidemia 06/04/2012  . Diabetes mellitus 06/04/2012    PLAN: Continue to pattern her insulin, improving with Diabetic nursing aid BP control still suboptimal with lability into mild range, negative for superimposed pre eclampsia at this point  BPP/Dopplers reassuring  Rettie Laird RYAN 07/26/2013,7:45 AM

## 2013-07-26 NOTE — Progress Notes (Signed)
Diabetes Treatment Program Recommendations  ADA Standards of Care 2012 Diabetes in Pregnancy Target Glucose Ranges:  Fasting: 60 - 90 mg/dL Preprandial: 60 - 161 mg/dL 1 hr postprandial: Less than 140mg /dL (from first bite of meal) 2 hr postprandial: Less than 120 mg/dL (from first bit of meal)Results for KALANA, YUST (MRN [id number]) as of 07/26/2013 12:29  Ref. Range 07/25/2013 07:57 07/25/2013 10:17 07/25/2013 11:54 07/25/2013 16:04   Glucose-Capillary Latest Range: 70-99 mg/dL 79 811 (H)  914 (H)    07/26/2013 08:03 07/26/2013 08:57 07/26/2013 10:24  69 (L)  72    These are results from yesterday, 8/14, when 2 hr pp lunch glucose was high at 155 mg/dL. Considered peak of am NPH at approx 1400 when she is eating lunch.  Therefore increased the am NPH to 38 units from 34 units to start this am.  Don't have today's pp lunch, but if continues above 120 mg/dL, will want to increase lunch meal coverage (will recheck tomorrow) [place] continue to be in target range, therefore will continue HS NPH at 26 units. So, no changes in insulin dosing needed at this time.  But will review over weekend and glad to assist with changes if needed. Thank you, Lenor Coffin, RN, Diabetes CNS [phone])

## 2013-07-27 LAB — GLUCOSE, CAPILLARY
Glucose-Capillary: 64 mg/dL — ABNORMAL LOW (ref 70–99)
Glucose-Capillary: 106 mg/dL — ABNORMAL HIGH (ref 70–99)

## 2013-07-27 NOTE — Progress Notes (Signed)
Patient ID: Rebecca Rhodes, female   DOB: 1981-05-23, 32 y.o.   MRN: [id number] ACULTY PRACTICE ANTEPARTUM COMPREHENSIVE PROGRESS NOTE  Rebecca Rhodes is a 32 y.o. G1P0 at [redacted]w[redacted]d  who is admitted for Class B DM with chronic HTN in pregnancy.   Fetal presentation is cephalic. Length of Stay:  12  Days  Subjective: Pt with no complaints Patient reports good fetal movement.  She reports no uterine contractions, no bleeding and no loss of fluid per vagina.  Vitals:  Blood pressure 136/83, pulse 83, temperature 97.5 F (36.4 C), temperature source Oral, resp. rate 20, height 5\' 4"  (1.626 m), weight 237 lb (107.502 kg), last menstrual period 12/01/2012, SpO2 100.00%. Physical Examination: General appearance - alert, well appearing, and in no distress Abdomen - soft, nontender, nondistended, no masses or organomegaly gravid   Fetal Monitoring:  Baseline: 140's bpm, Variability: Good {> 6 bpm) and Accelerations: Reactive  Labs:  Results for orders placed during the hospital encounter of 07/15/13 (from the past 24 hour(s))  GLUCOSE, CAPILLARY   Collection Time    07/26/13 10:24 AM      Result Value Range   Glucose-Capillary 72  70 - 99 mg/dL   Comment 1 Documented in Chart     Comment 2 Notify RN    GLUCOSE, CAPILLARY   Collection Time    07/26/13  3:57 PM      Result Value Range   Glucose-Capillary 123 (*) 70 - 99 mg/dL   Comment 1 Hypo Protocol     Comment 2 Documented in Chart     Comment 3 Notify RN    GLUCOSE, CAPILLARY   Collection Time    07/26/13 11:24 PM      Result Value Range   Glucose-Capillary 130 (*) 70 - 99 mg/dL  GLUCOSE, CAPILLARY   Collection Time    07/27/13  7:52 AM      Result Value Range   Glucose-Capillary 64 (*) 70 - 99 mg/dL   Comment 1 Notify RN      Imaging Studies:    07/25/2013 BPP 8/8 Normal AFI    Medications:  Scheduled . clindamycin  300 mg Oral Q6H  . docusate sodium  100 mg Oral Daily  . insulin aspart  0-32 Units Subcutaneous QAC breakfast   . insulin aspart  0-32 Units Subcutaneous TID PC  . insulin aspart  10-20 Units Subcutaneous TID WC  . insulin aspart  9-12 Units Subcutaneous Once  . insulin NPH  26 Units Subcutaneous QHS  . insulin NPH  38 Units Subcutaneous QAC breakfast  . labetalol  800 mg Oral TID  . NIFEdipine  30 mg Oral BID  . prenatal multivitamin  1 tablet Oral Q1200  . sodium chloride  3 mL Intravenous Q12H  . valACYclovir  500 mg Oral Daily   I have reviewed the patient's current medications.  ASSESSMENT: Patient Active Problem List   Diagnosis Date Noted  . Boil, leg 06/24/2013  . 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 Diabetes mellitus, antepartum 02/18/2013  . Benign essential hypertension antepartum 02/18/2013  . Supervision of high-risk pregnancy 02/04/2013  . Acute respiratory failure 10/24/2012  . DKA (diabetic ketoacidoses) 06/04/2012  . HTN (hypertension) 06/04/2012  . Hyperlipidemia 06/04/2012  . Diabetes mellitus 06/04/2012    PLAN: BP and Glc under better control now Biweekly antenatal testing  Continue routine antenatal care.   HARRAWAY-SMITH, Valeria Boza 07/27/2013,8:34 AM

## 2013-07-27 NOTE — Progress Notes (Signed)
Notified Dr. Debroah Loop of Bp 159/115.  Requesting to administer 1000 a.m. Labetalol now.  Request granted.  Pt. Received 800 mg. Labetalol p.o. x1 now.

## 2013-07-28 DIAGNOSIS — I10 Essential (primary) hypertension: Secondary | ICD-10-CM

## 2013-07-28 LAB — GLUCOSE, CAPILLARY
Glucose-Capillary: 173 mg/dL — ABNORMAL HIGH (ref 70–99)
Glucose-Capillary: 41 mg/dL — CL (ref 70–99)
Glucose-Capillary: 93 mg/dL (ref 70–99)

## 2013-07-28 LAB — TYPE AND SCREEN: Antibody Screen: NEGATIVE

## 2013-07-28 MED ORDER — DEXTROSE 5 % IN LACTATED RINGERS IV BOLUS
250.0000 mL | Freq: Once | INTRAVENOUS | Status: AC
  Administered 2013-07-28: 250 mL via INTRAVENOUS

## 2013-07-28 MED ORDER — GLUCOSE 40 % PO GEL: 1.0000 | ORAL | Status: DC | PRN

## 2013-07-28 MED ORDER — GLUCOSE 40 % PO GEL
ORAL | Status: AC
  Administered 2013-07-28: 31 g
  Filled 2013-07-28: qty 0.83

## 2013-07-28 NOTE — Progress Notes (Addendum)
Pt. Called for nurse and c/o feeling bad.  Fingerstick CBG 41.  Give Insta-glucose 31 gms. P.o. X1.  Informed Dr. Reola Calkins of low CBG.   Infusing 250 cc bolus of D5LR via S. L in right arm.

## 2013-07-28 NOTE — Progress Notes (Signed)
Patient ID: Rebecca Rhodes, female   DOB: 07/27/1981, 32 y.o.   MRN: [id number]  FACULTY PRACTICE ANTEPARTUM(COMPREHENSIVE) NOTE  Rebecca Rhodes is a 32 y.o. G1P0 at [redacted]w[redacted]d  who is admitted for uncontrolled hypertension and DM, non reassuring fetal testing.    Length of Stay:  13  Days  Subjective: No complaints of headache, scotomata, RUQ pain Patient reports the fetal movement as active. Patient reports uterine contraction  activity as none. Patient reports  vaginal bleeding as none. Patient describes fluid per vagina as None.  Vitals:  Blood pressure 127/79, pulse 91, temperature 97.3 F (36.3 C), temperature source Oral, resp. rate 18, height 5\' 4"  (1.626 m), weight 237 lb (107.502 kg), last menstrual period 12/01/2012, SpO2 100.00%. Pt had a few elevated pressures yesterday, most recent are normal.   Physical Examination:  General appearance - alert, well appearing, and in no distress Abdomen - soft, fundus nontender, no RUQ pain Musculoskeletal - no muscular tenderness noted, no evidence of DVT  Fetal Monitoring:  Pt had 3 NSTs yesterday.  All were reassuring  The 10 am NST was reactive.  The next two had many 10 x 10 accelerations, good variability, and no decelerations  Labs:  Results for orders placed during the hospital encounter of 07/15/13 (from the past 24 hour(s))  GLUCOSE, CAPILLARY   Collection Time    07/27/13 10:02 AM      Result Value Range   Glucose-Capillary 88  70 - 99 mg/dL   Comment 1 Notify RN    GLUCOSE, CAPILLARY   Collection Time    07/27/13  2:34 PM      Result Value Range   Glucose-Capillary 121 (*) 70 - 99 mg/dL   Comment 1 Notify RN    GLUCOSE, CAPILLARY   Collection Time    07/27/13 10:29 PM      Result Value Range   Glucose-Capillary 106 (*) 70 - 99 mg/dL  GLUCOSE, CAPILLARY   Collection Time    07/28/13  7:48 AM      Result Value Range   Glucose-Capillary 94  70 - 99 mg/dL   Comment 1 Notify RN    TYPE AND SCREEN   Collection Time   07/28/13  7:50 AM      Result Value Range   ABO/RH(D) O POS     Antibody Screen NEG     Sample Expiration 07/31/2013      Medications:  Scheduled . clindamycin  300 mg Oral Q6H  . docusate sodium  100 mg Oral Daily  . insulin aspart  0-32 Units Subcutaneous QAC breakfast  . insulin aspart  0-32 Units Subcutaneous TID PC  . insulin aspart  10-20 Units Subcutaneous TID WC  . insulin aspart  9-12 Units Subcutaneous Once  . insulin NPH  26 Units Subcutaneous QHS  . insulin NPH  38 Units Subcutaneous QAC breakfast  . labetalol  800 mg Oral TID  . NIFEdipine  30 mg Oral BID  . prenatal multivitamin  1 tablet Oral Q1200  . sodium chloride  3 mL Intravenous Q12H  . valACYclovir  500 mg Oral Daily   I have reviewed the patient's current medications.  ASSESSMENT: Patient Active Problem List   Diagnosis Date Noted  . Supervision of high-risk pregnancy 02/04/2013    Priority: High  . Diabetes mellitus 06/04/2012    Priority: High  . HTN (hypertension) 06/04/2012    Priority: Medium  . Boil, leg 06/24/2013  . 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  Diabetes mellitus, antepartum 02/18/2013  . Benign essential hypertension antepartum 02/18/2013  . Acute respiratory failure 10/24/2012  . DKA (diabetic ketoacidoses) 06/04/2012  . Hyperlipidemia 06/04/2012    PLAN: 32 yo G1P0 at 34 weeks 1 day with CHTN, Type 2 DM, and elevated dopplers.  Pt does not have pre eclampsia at this time.  1-Continue medication regimen for HTN adn DM 2-Pt to go on monitor now.  Will rpt BPP if continues to be nonreactive   Rebecca Rhodes H. 07/28/2013,9:14 AM

## 2013-07-28 NOTE — Progress Notes (Signed)
CBG 93.  Pt. Reports feeling better.  D5LR has been removed.  Pt. Maintains  S.L.  Flushes well.

## 2013-07-29 LAB — COMPREHENSIVE METABOLIC PANEL
ALT: <5 U/L (ref 0–35)
AST: 14 U/L (ref 0–37)
Albumin: 2.6 g/dL — ABNORMAL LOW (ref 3.5–5.2)
CO2: 16 mEq/L — ABNORMAL LOW (ref 19–32)
Calcium: 9.2 mg/dL (ref 8.4–10.5)
Chloride: 103 mEq/L (ref 96–112)
GFR calc non Af Amer: >90 mL/min (ref >90)
Sodium: 130 mEq/L — ABNORMAL LOW (ref 135–145)

## 2013-07-29 LAB — CBC
MCH: 28.8 pg (ref 26.0–34.0)
Platelets: 240 10*3/uL (ref 150–400)
RBC: 4.51 MIL/uL (ref 3.87–5.11)
RDW: 15.7 % — ABNORMAL HIGH (ref 11.5–15.5)
WBC: 9.1 10*3/uL (ref 4.0–10.5)

## 2013-07-29 LAB — GLUCOSE, CAPILLARY
Glucose-Capillary: 89 mg/dL (ref 70–99)
Glucose-Capillary: 152 mg/dL — ABNORMAL HIGH (ref 70–99)
Glucose-Capillary: 142 mg/dL — ABNORMAL HIGH (ref 70–99)

## 2013-07-29 NOTE — Progress Notes (Signed)
Patient ID: Nasha Diss, female   DOB: 1981/01/12, 32 y.o.   MRN: [id number]  FACULTY PRACTICE ANTEPARTUM(COMPREHENSIVE) NOTE  Maisee Vollman is a 32 y.o. G1P0 at [redacted]w[redacted]d  who is admitted for uncontrolled hypertension and DM, non reassuring fetal testing.    Length of Stay:  14  Days  Subjective: No complaints of headache, scotomata, RUQ pain Patient reports the fetal movement as active. Patient reports uterine contraction  activity as none. Patient reports  vaginal bleeding as none. Patient describes fluid per vagina as None.  Vitals:  Blood pressure 154/90, pulse 77, temperature 97.8 F (36.6 C), temperature source Oral, resp. rate 20, height 5\' 4"  (1.626 m), weight 107.502 kg (237 lb), last menstrual period 12/01/2012, SpO2 100.00%. BP improved yesterday on with most being 118-154/79-91.    Physical Examination:  General appearance - alert, well appearing, and in no distress Abdomen - soft, fundus nontender, no RUQ pain Musculoskeletal - no muscular tenderness noted, no evidence of DVT  Fetal Monitoring:  Pt had 3 NSTs 07/28/13.  All were reassuring  The 5pm NST was reactive.  The  other two had many 10 x 10 accelerations, good variability, and no decelerations  Labs:  Results for orders placed during the hospital encounter of 07/15/13 (from the past 24 hour(s))  GLUCOSE, CAPILLARY   Collection Time    07/28/13  7:48 AM      Result Value Range   Glucose-Capillary 94  70 - 99 mg/dL   Comment 1 Notify RN    TYPE AND SCREEN   Collection Time    07/28/13  7:50 AM      Result Value Range   ABO/RH(D) O POS     Antibody Screen NEG     Sample Expiration 07/31/2013    GLUCOSE, CAPILLARY   Collection Time    07/28/13 10:10 AM      Result Value Range   Glucose-Capillary 173 (*) 70 - 99 mg/dL   Comment 1 Notify RN    GLUCOSE, CAPILLARY   Collection Time    07/28/13  3:47 PM      Result Value Range   Glucose-Capillary 93  70 - 99 mg/dL   Comment 1 Notify RN    GLUCOSE, CAPILLARY    Collection Time    07/28/13  6:19 PM      Result Value Range   Glucose-Capillary 41 (*) 70 - 99 mg/dL   Comment 1 Notify RN    GLUCOSE, CAPILLARY   Collection Time    07/28/13  6:35 PM      Result Value Range   Glucose-Capillary 93  70 - 99 mg/dL   Comment 1 Notify RN    GLUCOSE, CAPILLARY   Collection Time    07/28/13 10:06 PM      Result Value Range   Glucose-Capillary 198 (*) 70 - 99 mg/dL    Medications:  Scheduled . clindamycin  300 mg Oral Q6H  . docusate sodium  100 mg Oral Daily  . insulin aspart  0-32 Units Subcutaneous QAC breakfast  . insulin aspart  0-32 Units Subcutaneous TID PC  . insulin aspart  10-20 Units Subcutaneous TID WC  . insulin aspart  9-12 Units Subcutaneous Once  . insulin NPH  26 Units Subcutaneous QHS  . insulin NPH  38 Units Subcutaneous QAC breakfast  . labetalol  800 mg Oral TID  . NIFEdipine  30 mg Oral BID  . prenatal multivitamin  1 tablet Oral Q1200  . sodium chloride  3 mL Intravenous Q12H  . valACYclovir  500 mg Oral Daily   I have reviewed the patient's current medications.  ASSESSMENT: Patient Active Problem List   Diagnosis Date Noted  . Boil, leg 06/24/2013  . 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 Diabetes mellitus, antepartum 02/18/2013  . Benign essential hypertension antepartum 02/18/2013  . Supervision of high-risk pregnancy 02/04/2013  . Acute respiratory failure 10/24/2012  . DKA (diabetic ketoacidoses) 06/04/2012  . HTN (hypertension) 06/04/2012  . Hyperlipidemia 06/04/2012  . Diabetes mellitus 06/04/2012    PLAN: 32 yo G1P0 at 34 weeks 2 day with CHTN, Type 2 DM, and elevated dopplers.  Pt does not have pre eclampsia at this time.  1) elevated BP - no severe range - cont  Current labetalol  - labs today  2) elevated blood sugar - yesterday afternoon with low of 41 pt did not eat large meal at lunch and usually eats a snack - no other lows - sugars otherwise high at meals but fasting  good - will manage as per diabetes nurse but no change at this time to NPH dosing  3) FWB - reactive x 1 - reassuring on others -will plan for bpp and dopplers tomorrow as scheduled unless not reactive today.    Abrahim Sargent L 07/29/2013,7:32 AM

## 2013-07-29 NOTE — Progress Notes (Signed)
Ur chart review completed.  

## 2013-07-30 ENCOUNTER — Inpatient Hospital Stay (HOSPITAL_COMMUNITY): Payer: Medicaid Other

## 2013-07-30 DIAGNOSIS — E119 Type 2 diabetes mellitus without complications: Secondary | ICD-10-CM

## 2013-07-30 LAB — GLUCOSE, CAPILLARY
Glucose-Capillary: 94 mg/dL (ref 70–99)
Glucose-Capillary: 121 mg/dL — ABNORMAL HIGH (ref 70–99)

## 2013-07-30 MED ORDER — INSULIN ASPART 100 UNIT/ML ~~LOC~~ SOLN: 10.0000 [IU] | Freq: Three times a day (TID) | SUBCUTANEOUS | Status: DC

## 2013-07-30 MED ORDER — INSULIN ASPART 100 UNIT/ML ~~LOC~~ SOLN
0.0000 [IU] | Freq: Three times a day (TID) | SUBCUTANEOUS | Status: DC
  Administered 2013-07-30: 24 [IU] via SUBCUTANEOUS
  Administered 2013-07-30: 23 [IU] via SUBCUTANEOUS
  Administered 2013-07-31: 13 [IU] via SUBCUTANEOUS
  Administered 2013-07-31: 29 [IU] via SUBCUTANEOUS
  Administered 2013-07-31: 25 [IU] via SUBCUTANEOUS
  Administered 2013-08-01: 22 [IU] via SUBCUTANEOUS
  Administered 2013-08-01: 23 [IU] via SUBCUTANEOUS
  Administered 2013-08-01: 11 [IU] via SUBCUTANEOUS
  Administered 2013-08-02: 23 [IU] via SUBCUTANEOUS
  Administered 2013-08-02: 20.5 [IU] via SUBCUTANEOUS
  Administered 2013-08-02: 11 [IU] via SUBCUTANEOUS
  Administered 2013-08-03: 14 [IU] via SUBCUTANEOUS
  Administered 2013-08-03: 28 [IU] via SUBCUTANEOUS
  Administered 2013-08-03 – 2013-08-04 (×2): 23 [IU] via SUBCUTANEOUS
  Administered 2013-08-04: 14 [IU] via SUBCUTANEOUS
  Administered 2013-08-04 – 2013-08-05 (×2): 16 [IU] via SUBCUTANEOUS
  Administered 2013-08-05: 30 [IU] via SUBCUTANEOUS
  Administered 2013-08-05: 20 [IU] via SUBCUTANEOUS

## 2013-07-30 NOTE — Progress Notes (Signed)
07/30/13 1200  Clinical Encounter Type  Visited With Patient  Visit Type Spiritual support;Social support  Spiritual Encounters  Spiritual Needs Emotional   Rebecca Rhodes was in great spirits during this follow-up visit.  A coworker visited yesterday, and Rebecca Rhodes is looking forward to a visit from her brother next week.  Keeping in touch with friends via social media and staying calm and focused with music are both helping her cope well.  Provided pastoral presence, reflective listening, and encouragement.  Also reminded Rebecca Rhodes to reach out for support anytime.  Spiritual Care will continue to follow.  [address] [place], [place] [phone]

## 2013-07-30 NOTE — Progress Notes (Signed)
Diabetes Treatment Program Recommendations  ADA Standards of Care 2012 Diabetes in Pregnancy Target Glucose Ranges:  Fasting: 60 - 90 mg/dL Preprandial: 60 - 161 mg/dL 1 hr postprandial: Less than 140mg /dL (from first bite of meal) 2 hr postprandial: Less than 120 mg/dL (from first bit of meal)  Spoke with Dr Ike Bene over weekend regarding adjustment in meal coverage.  However at that time, her pp levels were controlled. They are slightly hgh now, therefore have increased her meal coverage to 1 uniit per 2 grams carbohydrate.  May need to adjust individually per meal.  Will follow. Thank you, Lenor Coffin, RN, Diabetes CNS [phone])

## 2013-07-30 NOTE — Progress Notes (Signed)
Yariah Selvey  was seen today for an ultrasound appointment.  See full report in [facility].  Impression: Single IUP at 34 3/7 weeks CHTN, Class B GDM on insulin, lagging growth noted on previous ultrasound (8/11) Elevated UA Dopplers.  No AEDF or REDF BPP 6/8 (-2 for non sustained fetal breathing).  Fetal tracing earlier today reassuring. Normal amniotic fluid volume  Recommendations: Please resume fetal monitoring once patient returns to antepartum unit. If NST is reactive, would continue twice weekly BPPs with UA Dopplers while inpatient If non reactive, would repeat BPP in 8-12 hours.  Alpha Gula, MD

## 2013-07-30 NOTE — Progress Notes (Signed)
Patient ID: Rebecca Rhodes, female   DOB: February 11, 1981, 32 y.o.   MRN: [id number]  FACULTY PRACTICE ANTEPARTUM(COMPREHENSIVE) NOTE  Rebecca Rhodes is a 32 y.o. G1P0 at [redacted]w[redacted]d  who is admitted for uncontrolled hypertension and DM, non reassuring fetal testing.    Length of Stay:  15  Days  Subjective: No complaints of headache, scotomata, RUQ pain Patient reports the fetal movement as active. Patient reports uterine contraction  activity as none. Patient reports  vaginal bleeding as none. Patient describes fluid per vagina as None.  Vitals:  Blood pressure 119/72, pulse 79, temperature 98.2 F (36.8 C), temperature source Oral, resp. rate 18, height 5\' 4"  (1.626 m), weight 237 lb (107.502 kg), last menstrual period 12/01/2012, SpO2 100.00%.     Physical Examination:  General appearance - alert, well appearing, and in no distress Abdomen - soft, fundus nontender, no RUQ pain Musculoskeletal - no muscular tenderness noted, no evidence of DVT  Fetal Monitoring:  Baseline 140, mod variability, + 10 x 10 accels, no decels. Toco: no contractions Labs:  Results for orders placed during the hospital encounter of 07/15/13 (from the past 24 hour(s))  GLUCOSE, CAPILLARY   Collection Time    07/29/13  7:57 AM      Result Value Range   Glucose-Capillary 63 (*) 70 - 99 mg/dL   Comment 1 Documented in Chart     Comment 2 Notify RN    CBC   Collection Time    07/29/13  8:18 AM      Result Value Range   WBC 9.1  4.0 - 10.5 K/uL   RBC 4.51  3.87 - 5.11 MIL/uL   Hemoglobin 13.0  12.0 - 15.0 g/dL   HCT 10.2  72.5 - 36.6 %   MCV 85.6  78.0 - 100.0 fL   MCH 28.8  26.0 - 34.0 pg   MCHC 33.7  30.0 - 36.0 g/dL   RDW 44.0 (*) 34.7 - 42.5 %   Platelets 240  150 - 400 K/uL  COMPREHENSIVE METABOLIC PANEL   Collection Time    07/29/13  8:18 AM      Result Value Range   Sodium 130 (*) 135 - 145 mEq/L   Potassium 3.8  3.5 - 5.1 mEq/L   Chloride 103  96 - 112 mEq/L   CO2 16 (*) 19 - 32 mEq/L   Glucose,  Bld 66 (*) 70 - 99 mg/dL   BUN 16  6 - 23 mg/dL   Creatinine, Ser 9.56  0.50 - 1.10 mg/dL   Calcium 9.2  8.4 - 38.7 mg/dL   Total Protein 6.9  6.0 - 8.3 g/dL   Albumin 2.6 (*) 3.5 - 5.2 g/dL   AST 14  0 - 37 U/L   ALT <5  0 - 35 U/L   Alkaline Phosphatase 83  39 - 117 U/L   Total Bilirubin 0.2 (*) 0.3 - 1.2 mg/dL   GFR calc non Af Amer >90  >90 mL/min   GFR calc Af Amer >90  >90 mL/min  GLUCOSE, CAPILLARY   Collection Time    07/29/13 10:38 AM      Result Value Range   Glucose-Capillary 89  70 - 99 mg/dL   Comment 1 Documented in Chart     Comment 2 Notify RN    GLUCOSE, CAPILLARY   Collection Time    07/29/13  2:46 PM      Result Value Range   Glucose-Capillary 152 (*) 70 - 99 mg/dL  Comment 1 Documented in Chart     Comment 2 Notify RN    GLUCOSE, CAPILLARY   Collection Time    07/29/13 10:31 PM      Result Value Range   Glucose-Capillary 142 (*) 70 - 99 mg/dL    Medications:  Scheduled . clindamycin  300 mg Oral Q6H  . docusate sodium  100 mg Oral Daily  . insulin aspart  0-32 Units Subcutaneous QAC breakfast  . insulin aspart  0-32 Units Subcutaneous TID PC  . insulin aspart  10-20 Units Subcutaneous TID WC  . insulin aspart  9-12 Units Subcutaneous Once  . insulin NPH  26 Units Subcutaneous QHS  . insulin NPH  38 Units Subcutaneous QAC breakfast  . labetalol  800 mg Oral TID  . NIFEdipine  30 mg Oral BID  . prenatal multivitamin  1 tablet Oral Q1200  . valACYclovir  500 mg Oral Daily   I have reviewed the patient's current medications.  ASSESSMENT: Patient Active Problem List   Diagnosis Date Noted  . Diabetes mellitus, antepartum 02/18/2013    Priority: Medium  . Benign essential hypertension antepartum 02/18/2013    Priority: Medium  . Supervision of high-risk pregnancy 02/04/2013    Priority: Medium  . HTN (hypertension) 06/04/2012    Priority: Medium  . Diabetes mellitus 06/04/2012    Priority: Medium  . Boil, leg 06/24/2013  . 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 Acute respiratory failure 10/24/2012  . DKA (diabetic ketoacidoses) 06/04/2012  . Hyperlipidemia 06/04/2012    PLAN: 32 yo G1P0 at 34 weeks 3 day with CHTN, Type 2 DM, and elevated dopplers. - Continue monitoring BP and current antihypertensives - Continue monitoring CBGs - F/U BPP and dopplers today    Rebecca Rhodes 07/30/2013,7:24 AM

## 2013-07-31 ENCOUNTER — Inpatient Hospital Stay (HOSPITAL_COMMUNITY): Payer: Medicaid Other

## 2013-07-31 LAB — GLUCOSE, CAPILLARY
Glucose-Capillary: 65 mg/dL — ABNORMAL LOW (ref 70–99)
Glucose-Capillary: 85 mg/dL (ref 70–99)
Glucose-Capillary: 77 mg/dL (ref 70–99)

## 2013-07-31 NOTE — Progress Notes (Signed)
FACULTY PRACTICE ANTEPARTUM(COMPREHENSIVE) NOTE  Rebecca Rhodes is a 32 y.o. G1P0 at [redacted]w[redacted]d who is admitted for Monitoring for uncontrolled HTN and DM and non reassuring Fetal stress testLength of Stay:  16  Days  Subjective: Pt with no complaints Patient reports the fetal movement as active. Patient reports uterine contraction  activity as none. Patient reports  vaginal bleeding as none. Patient describes fluid per vagina as None.  Vitals:  Blood pressure 142/82, pulse 81, temperature 98.3 F (36.8 C), temperature source Oral, resp. rate 18, height 5\' 4"  (1.626 m), weight 107.502 kg (237 lb), last menstrual period 12/01/2012, SpO2 100.00%. Filed Vitals:   07/30/13 2300 07/31/13 0002 07/31/13 0100 07/31/13 0635  BP:  136/81  142/82  Pulse:  74  81  Temp:  98.4 F (36.9 C)  98.3 F (36.8 C)  TempSrc:  Oral  Oral  Resp: 18 18 18 18   Height:      Weight:      SpO2:         Physical Examination:  General appearance - alert, well appearing, and in no distress and oriented to person, place, and time Heart - normal rate and regular rhythm Abdomen - soft, nontender, nondistended Fundal Height:  size equals dates Cervical Exam: Not evaluated. and found to be Extremities: extremities normal, atraumatic, no cyanosis or edema and Homans sign is negative, no sign of DVT with DTRs 2+ bilaterally Membranes:intact  Fetal Monitoring:  Baseline: 140 bpm min variability, no accels or decels over last 18hrs  Labs:  Results for orders placed during the hospital encounter of 07/15/13 (from the past 24 hour(s))  GLUCOSE, CAPILLARY   Collection Time    07/30/13  7:59 AM      Result Value Range   Glucose-Capillary 82  70 - 99 mg/dL   Comment 1 Documented in Chart     Comment 2 Notify RN    GLUCOSE, CAPILLARY   Collection Time    07/30/13 10:37 AM      Result Value Range   Glucose-Capillary 94  70 - 99 mg/dL   Comment 1 Documented in Chart     Comment 2 Notify RN    GLUCOSE, CAPILLARY   Collection Time    07/30/13  3:14 PM      Result Value Range   Glucose-Capillary 121 (*) 70 - 99 mg/dL   Comment 1 Documented in Chart     Comment 2 Notify RN    GLUCOSE, CAPILLARY   Collection Time    07/30/13  9:44 PM      Result Value Range   Glucose-Capillary 88  70 - 99 mg/dL    Imaging Studies:    8/19 BPP Single IUP at 34 3/7 weeks  CHTN, Class B GDM on insulin, lagging growth noted on previous ultrasound (8/11)  Elevated UA Dopplers. No AEDF or REDF  BPP 6/8 (-2 for non sustained fetal breathing). Fetal tracing earlier today reassuring.  Normal amniotic fluid volume  Recommendations:  Please resume fetal monitoring once patient returns to antepartum unit.  If NST is reactive, would continue twice weekly BPPs with UA Dopplers while inpatient  If non reactive, would repeat BPP in 8-12 hours.  Alpha Gula, MD   Medications:  Scheduled . clindamycin  300 mg Oral Q6H  . docusate sodium  100 mg Oral Daily  . insulin aspart  0-30 Units Subcutaneous TID WC  . insulin aspart  0-32 Units Subcutaneous QAC breakfast  . insulin aspart  0-32 Units Subcutaneous  TID PC  . insulin aspart  9-12 Units Subcutaneous Once  . insulin NPH  26 Units Subcutaneous QHS  . insulin NPH  38 Units Subcutaneous QAC breakfast  . labetalol  800 mg Oral TID  . NIFEdipine  30 mg Oral BID  . prenatal multivitamin  1 tablet Oral Q1200  . valACYclovir  500 mg Oral Daily   I have reviewed the patient's current medications.  ASSESSMENT: Rebecca Rhodes is a 32 y.o. G1P0 at [redacted]w[redacted]d who is admitted for DM , uncontrolled HTN, and NRFHT.    Patient Active Problem List   Diagnosis Date Noted  . Boil, leg 06/24/2013  . 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 Diabetes mellitus, antepartum 02/18/2013  . Benign essential hypertension antepartum 02/18/2013  . Supervision of high-risk pregnancy 02/04/2013  . Acute respiratory failure 10/24/2012  . DKA (diabetic ketoacidoses) 06/04/2012  . HTN  (hypertension) 06/04/2012  . Hyperlipidemia 06/04/2012  . Diabetes mellitus 06/04/2012    PLAN: 32 yo G1P0 at 34 weeks 3 day with CHTN, Type 2 DM, and elevated dopplers.  - Continue monitoring BP and current antihypertensives. No change at this time. No severe range over last 24hrs - Continue monitoring CBGs, appreciate DM nursing recommendations. Glucose at goal yesterday. Continue current regimen. FWB: 8/19 : BPP 6/10 - repeat this AM and f/u 2/2 non reactive strip.  Tawana Scale, MD OB Fellow

## 2013-07-31 NOTE — Progress Notes (Signed)
Rebecca Rhodes  was seen today for an ultrasound appointment.  See full report in [facility].  Impression: Single IUP at 34 3/7 weeks CHTN, Class B GDM on insulin, lagging growth noted on previous ultrasound (8/11) BPP 8/8 Normal amniotic fluid volume  Recommendations: Continue twice weekly testing  Alpha Gula, MD

## 2013-08-01 ENCOUNTER — Ambulatory Visit (HOSPITAL_COMMUNITY): Payer: Medicaid Other

## 2013-08-01 LAB — GLUCOSE, CAPILLARY: Glucose-Capillary: 132 mg/dL — ABNORMAL HIGH (ref 70–99)

## 2013-08-01 NOTE — Progress Notes (Signed)
UR completed 

## 2013-08-01 NOTE — Progress Notes (Signed)
FACULTY PRACTICE ANTEPARTUM(COMPREHENSIVE) NOTE  Rebecca Rhodes is a 32 y.o. G1P0 at [redacted]w[redacted]d admitted for   Single IUP at 69 5/7 weeks  CHTN, Class B GDM on insulin, lagging growth noted on previous ultrasound (8/11) growth has been consistent around 25th percentile. Was 18 % at 28 wk, and 31 % at 8/11, is following growth curves  Dopplers now at 97%ile , RI .79,  But no AEDF  Or REDF noted. BPP 8/8  Normal amniotic fluid volume   Fetal presentation is cephalic. Length of Stay:  17  Days  Subjective: No c/o denies headaches  Patient reports the fetal movement as active. Patient reports uterine contraction  activity as none. Patient reports  vaginal bleeding as none. Patient describes fluid per vagina as None.  Vitals:  Blood pressure 147/81, pulse 82, temperature 98.2 F (36.8 C), temperature source Oral, resp. rate 20, height 5\' 4"  (1.626 m), weight 108.138 kg (238 lb 6.4 oz), last menstrual period 12/01/2012, SpO2 100.00%. Physical Examination:  General appearance - alert, well appearing, and in no distress Heart - normal rate and regular rhythm Abdomen - soft, nontender, nondistended Fundal Height:  size equals dates Cervical Exam: Not evaluated. . Extremities: extremities normal, atraumatic, no cyanosis or edema and Homans sign is negative, no sign of DVT with DTRs 2+ bilaterally Membranes:intact  Fetal Monitoring:  Baseline: 140 bpm no decels , appropriate for Gestation  Labs:  Results for orders placed during the hospital encounter of 07/15/13 (from the past 24 hour(s))  TYPE AND SCREEN   Collection Time    07/31/13  7:25 AM      Result Value Range   ABO/RH(D) O POS     Antibody Screen NEG     Sample Expiration 08/03/2013    GLUCOSE, CAPILLARY   Collection Time    07/31/13  8:03 AM      Result Value Range   Glucose-Capillary 65 (*) 70 - 99 mg/dL  GLUCOSE, CAPILLARY   Collection Time    07/31/13 10:27 AM      Result Value Range   Glucose-Capillary 85  70 - 99 mg/dL   GLUCOSE, CAPILLARY   Collection Time    07/31/13  3:31 PM      Result Value Range   Glucose-Capillary 77  70 - 99 mg/dL  GLUCOSE, CAPILLARY   Collection Time    07/31/13 10:04 PM      Result Value Range   Glucose-Capillary 122 (*) 70 - 99 mg/dL       Medications:  Scheduled . clindamycin  300 mg Oral Q6H  . docusate sodium  100 mg Oral Daily  . insulin aspart  0-30 Units Subcutaneous TID WC  . insulin aspart  0-32 Units Subcutaneous QAC breakfast  . insulin aspart  0-32 Units Subcutaneous TID PC  . insulin aspart  9-12 Units Subcutaneous Once  . insulin NPH  26 Units Subcutaneous QHS  . insulin NPH  38 Units Subcutaneous QAC breakfast  . labetalol  800 mg Oral TID  . NIFEdipine  30 mg Oral BID  . prenatal multivitamin  1 tablet Oral Q1200  . valACYclovir  500 mg Oral Daily   I have reviewed the patient's current medications.  ASSESSMENT: Patient Active Problem List   Diagnosis Date Noted  . Boil, leg 06/24/2013  . 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 Diabetes mellitus, antepartum 02/18/2013  . Benign essential hypertension antepartum 02/18/2013  . Supervision of high-risk pregnancy 02/04/2013  . Acute respiratory failure 10/24/2012  .  DKA (diabetic ketoacidoses) 06/04/2012  . HTN (hypertension) 06/04/2012  . Hyperlipidemia 06/04/2012  . Diabetes mellitus 06/04/2012    PLAN: Continue BPP twice weekly, resume Friday/Tues testing, Continue current insulins NPH 32 am, 26 hs, with pc and ssi novolog Continue inpt status,  Induce at change in condition , development of AEDF/REDF. Rebecca Rhodes V 08/01/2013,6:45 AM

## 2013-08-01 NOTE — Progress Notes (Signed)
Reviewed glucose over past 48 hours. Present insulin regimen working well. No changes recommended. Thank you, Lenor Coffin, RN, Diabetes [facility] [phone])

## 2013-08-02 ENCOUNTER — Inpatient Hospital Stay (HOSPITAL_COMMUNITY): Payer: Medicaid Other

## 2013-08-02 LAB — GLUCOSE, CAPILLARY
Glucose-Capillary: 61 mg/dL — ABNORMAL LOW (ref 70–99)
Glucose-Capillary: 110 mg/dL — ABNORMAL HIGH (ref 70–99)
Glucose-Capillary: 163 mg/dL — ABNORMAL HIGH (ref 70–99)
Glucose-Capillary: 95 mg/dL (ref 70–99)

## 2013-08-02 LAB — WET PREP, GENITAL
Trich, Wet Prep: NONE SEEN
Yeast Wet Prep HPF POC: NONE SEEN

## 2013-08-02 NOTE — Progress Notes (Signed)
FACULTY PRACTICE ANTEPARTUM(COMPREHENSIVE) NOTE  Rebecca Rhodes is a 32 y.o. G1P0 at [redacted]w[redacted]d who is CHTN, Class B GDM on insulin, lagging growth noted on previous ultrasound (8/11) growth has been consistent around 25th percentile. Was 18 % at 28 wk, and 31 % at 8/11, is following growth curves Dopplers now at 97%ile , RI .79, But no AEDF Or REDF noted.  BPP 8/8   Normal amniotic fluid volume  Length of Stay:  18  Days  Subjective: Complains of vaginal itching Patient reports the fetal movement as active. Patient reports uterine contraction  activity as none. Patient reports  vaginal bleeding as none. Patient describes fluid per vagina as None.  Vitals:  Blood pressure 126/83, pulse 86, temperature 98.3 F (36.8 C), temperature source Oral, resp. rate 20, height 5\' 4"  (1.626 m), weight 108.138 kg (238 lb 6.4 oz), last menstrual period 12/01/2012, SpO2 100.00%. Physical Examination:  General appearance - alert, well appearing, and in no distress, oriented to person, place, and time and overweight Heart - normal rate and regular rhythm Abdomen - soft, nontender, nondistended Fundal Height:  size equals dates Cervical Exam: Not evaluated. and found to be not evaluated/  Extremities: extremities normal, atraumatic, no cyanosis or edema and Homans sign is negative, no sign of DVT with DTRs 2+ bilaterally Membranes:intact  Fetal Monitoring:  Baseline: 140s bpm, Variability: Good {> 6 bpm), Accelerations: Reactive and Decelerations: Occurs rare but had 2 decels this am. Largest down to 80 for with spontaneous recovery. Total length <93min  Labs:  Results for orders placed during the hospital encounter of 07/15/13 (from the past 24 hour(s))  GLUCOSE, CAPILLARY   Collection Time    08/01/13  8:27 PM      Result Value Range   Glucose-Capillary 113 (*) 70 - 99 mg/dL  GLUCOSE, CAPILLARY   Collection Time    08/02/13  8:08 AM      Result Value Range   Glucose-Capillary 61 (*) 70 - 99 mg/dL   GLUCOSE, CAPILLARY   Collection Time    08/02/13 10:24 AM      Result Value Range   Glucose-Capillary 110 (*) 70 - 99 mg/dL  GLUCOSE, CAPILLARY   Collection Time    08/02/13  2:59 PM      Result Value Range   Glucose-Capillary 163 (*) 70 - 99 mg/dL   Comment 1 Documented in Chart      Imaging Studies:    8/22 Single IUP at 34 6/7 weeks  CHTN, Class B GDM on insulin, lagging growth noted on previous ultrasound (8/11)  Elevated UA Dopplers - no evidence of absent or reversed diastolic flow  BPP 8/8  Normal amniotic fluid volume   Medications:  Scheduled . clindamycin  300 mg Oral Q6H  . docusate sodium  100 mg Oral Daily  . insulin aspart  0-30 Units Subcutaneous TID WC  . insulin aspart  0-32 Units Subcutaneous QAC breakfast  . insulin aspart  0-32 Units Subcutaneous TID PC  . insulin aspart  9-12 Units Subcutaneous Once  . insulin NPH  26 Units Subcutaneous QHS  . insulin NPH  38 Units Subcutaneous QAC breakfast  . labetalol  800 mg Oral TID  . NIFEdipine  30 mg Oral BID  . prenatal multivitamin  1 tablet Oral Q1200  . valACYclovir  500 mg Oral Daily   I have reviewed the patient's current medications.  ASSESSMENT: Rebecca Rhodes is a 32 y.o. G1P0 at [redacted]w[redacted]d who is admitted for [facility] and  Insulin dependent DM-B.    Patient Active Problem List   Diagnosis Date Noted  . Boil, leg 06/24/2013  . 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 Diabetes mellitus, antepartum 02/18/2013  . Benign essential hypertension antepartum 02/18/2013  . Supervision of high-risk pregnancy 02/04/2013  . Acute respiratory failure 10/24/2012  . DKA (diabetic ketoacidoses) 06/04/2012  . HTN (hypertension) 06/04/2012  . Hyperlipidemia 06/04/2012  . Diabetes mellitus 06/04/2012    PLAN: Continue BPP twice weekly, resume Friday/Tues testing,  Continue current insulins NPH 38 am, 26 hs, with pc and ssi novolog  Continue valtrex Continue clinda for boil Wet prep eval for yeast  infection  Continue inpt status, Induce at change in condition , development of AEDF/REDF.   Rebecca Rhodes 08/02/2013,4:54 PM

## 2013-08-02 NOTE — Progress Notes (Signed)
Rebecca Rhodes was in good spirits today and was grateful to be feeling a little better.  We talked about her feelings about becoming a mother and about allowing others to be her support right now.  I offered reflective listening and encouragement.  [employer] Pager, [phone] 12:22 PM

## 2013-08-02 NOTE — Progress Notes (Signed)
Rebecca Rhodes  was seen today for an ultrasound appointment.  See full report in [facility].  Impression: Single IUP at 34 6/7 weeks CHTN, Class B GDM on insulin, lagging growth noted on previous ultrasound (8/11) Elevated UA Dopplers - no evidence of absent or reversed diastolic flow BPP 8/8 Normal amniotic fluid volume  Recommendations: Twice weekly testing with BPPs (daily NSTSs) Weekly UA dopplers  Alpha Gula, MD

## 2013-08-02 NOTE — Progress Notes (Signed)
To MFM for ultrasound.

## 2013-08-03 LAB — TYPE AND SCREEN: Antibody Screen: NEGATIVE

## 2013-08-03 LAB — GLUCOSE, CAPILLARY
Glucose-Capillary: 71 mg/dL (ref 70–99)
Glucose-Capillary: 86 mg/dL (ref 70–99)
Glucose-Capillary: 163 mg/dL — ABNORMAL HIGH (ref 70–99)

## 2013-08-03 NOTE — Progress Notes (Signed)
Patient ID: Rebecca Rhodes, female   DOB: August 30, 1981, 32 y.o.   MRN: [id number] FACULTY PRACTICE ANTEPARTUM(COMPREHENSIVE) NOTE  Rebecca Rhodes is a 32 y.o. G1P0 with Estimated Date of Delivery: 09/07/13   By  early ultrasound [redacted]w[redacted]d  who is admitted for long term inpatient management of chronic hypertension and Class B diabetes, both now reasonably controlled.  UA Doppler studies have been chronically statistically elevated, >97%tile but no absent or reverse end diastolic flow has been noted..    Fetal presentation is cephalic. By soogram on 08/02/2013 Length of Stay:  19  Days  Date of admission:07/15/2013  Subjective: Pt is without complaints Patient reports the fetal movement as active. Patient reports uterine contraction  activity as none. Patient reports  vaginal bleeding as none. Patient describes fluid per vagina as None.  Vitals:  Blood pressure 142/91, pulse 79, temperature 98.5 F (36.9 C), temperature source Oral, resp. rate 20, height 5\' 4"  (1.626 m), weight 238 lb 6.4 oz (108.138 kg), last menstrual period 12/01/2012, SpO2 100.00%. Filed Vitals:   08/02/13 1612 08/02/13 1951 08/02/13 2351 08/03/13 0559  BP: 126/83 162/103 133/84 142/91  Pulse: 86 78 84 79  Temp: 98.3 F (36.8 C) 98.5 F (36.9 C) 98.5 F (36.9 C) 98.5 F (36.9 C)  TempSrc: Oral Oral Oral Oral  Resp: 20 20 20 20   Height:      Weight:      SpO2:       Physical Examination:  General appearance - alert, well appearing, and in no distress Abdomen - soft, nontender, nondistended, no masses or organomegaly Normal gravid abdomen Fundal Height:  size equals dates  Fetal Monitoring:  reactive NST with occasional self limiting decleration   reactive  Labs:  Results for orders placed during the hospital encounter of 07/15/13 (from the past 24 hour(s))  GLUCOSE, CAPILLARY   Collection Time    08/02/13  8:08 AM      Result Value Range   Glucose-Capillary 61 (*) 70 - 99 mg/dL  GLUCOSE, CAPILLARY   Collection Time    08/02/13 10:24 AM      Result Value Range   Glucose-Capillary 110 (*) 70 - 99 mg/dL  GLUCOSE, CAPILLARY   Collection Time    08/02/13  2:59 PM      Result Value Range   Glucose-Capillary 163 (*) 70 - 99 mg/dL   Comment 1 Documented in Chart    WET PREP, GENITAL   Collection Time    08/02/13  6:40 PM      Result Value Range   Yeast Wet Prep HPF POC NONE SEEN  NONE SEEN   Trich, Wet Prep NONE SEEN  NONE SEEN   Clue Cells Wet Prep HPF POC NONE SEEN  NONE SEEN   WBC, Wet Prep HPF POC MANY (*) NONE SEEN  GLUCOSE, CAPILLARY   Collection Time    08/02/13  9:58 PM      Result Value Range   Glucose-Capillary 95  70 - 99 mg/dL    Imaging Studies:   [id number] with stable Dopplers good fluid on 08/02/2013  Medications:  Scheduled . clindamycin  300 mg Oral Q6H  . docusate sodium  100 mg Oral Daily  . insulin aspart  0-30 Units Subcutaneous TID WC  . insulin aspart  0-32 Units Subcutaneous QAC breakfast  . insulin aspart  0-32 Units Subcutaneous TID PC  . insulin aspart  9-12 Units Subcutaneous Once  . insulin NPH  26 Units Subcutaneous QHS  . insulin NPH  38 Units Subcutaneous QAC breakfast  . labetalol  800 mg Oral TID  . NIFEdipine  30 mg Oral BID  . prenatal multivitamin  1 tablet Oral Q1200  . valACYclovir  500 mg Oral Daily   I have reviewed the patient's current medications.  ASSESSMENT: [redacted]w[redacted]d Estimated Date of Delivery: 09/07/13 Chronic hypertension, stable on labetalol 800 TID and Procardia XL 30 BID Boederline fetal Dopplers with no absent or reverse flow  Patient Active Problem List   Diagnosis Date Noted  . Boil, leg 06/24/2013  . 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 Diabetes mellitus, antepartum 02/18/2013  . Benign essential hypertension antepartum 02/18/2013  . Supervision of high-risk pregnancy 02/04/2013  . Acute respiratory failure 10/24/2012  . DKA (diabetic ketoacidoses) 06/04/2012  . HTN (hypertension) 06/04/2012  . Hyperlipidemia  06/04/2012  . Diabetes mellitus 06/04/2012    PLAN: Continue current care plan, twice weekly sonographic evaluations with BPP/Dopplers Intervene if clinical situation deteriorates  Antino Mayabb H 08/03/2013,7:45 AM

## 2013-08-04 LAB — GLUCOSE, CAPILLARY
Glucose-Capillary: 82 mg/dL (ref 70–99)
Glucose-Capillary: 90 mg/dL (ref 70–99)

## 2013-08-04 NOTE — Progress Notes (Signed)
Patient ID: Brihana Quickel, female   DOB: [birth year], 32 y.o.   MRN: [id number] Patient ID: Avelynn Sellin, female   DOB: [birth year], 32 y.o.   MRN: [id number] FACULTY PRACTICE ANTEPARTUM(COMPREHENSIVE) NOTE  Alayiah Fontes is a 32 y.o. G1P0 with Estimated Date of Delivery: 09/07/13   By  early ultrasound [redacted]w[redacted]d  who is admitted for long term inpatient management of chronic hypertension and Class B diabetes, both now reasonably controlled.  UA Doppler studies have been chronically statistically elevated, >97%tile but no absent or reverse end diastolic flow has been noted..    Fetal presentation is cephalic. By soogram on 08/02/2013 Length of Stay:  20  Days  Date of admission:07/15/2013  Subjective: Pt is without complaints Patient reports the fetal movement as active. Patient reports uterine contraction  activity as none. Patient reports  vaginal bleeding as none. Patient describes fluid per vagina as None.  Vitals:  Blood pressure 152/82, pulse 79, temperature 98.7 F (37.1 C), temperature source Oral, resp. rate 18, height 5\' 4"  (1.626 m), weight 238 lb 6.4 oz (108.138 kg), last menstrual period 12/01/2012, SpO2 100.00%. Filed Vitals:   08/04/13 0609 08/04/13 0705 08/04/13 0754 08/04/13 1154  BP: 145/96  150/85 152/82  Pulse: 83  70 79  Temp:   98.2 F (36.8 C) 98.7 F (37.1 C)  TempSrc:   Oral Oral  Resp: 18 18 18 18   Height:      Weight:      SpO2:       Physical Examination:  General appearance - alert, well appearing, and in no distress Abdomen - soft, nontender, nondistended, no masses or organomegaly Normal gravid abdomen Fundal Height:  size equals dates  Fetal Monitoring:  reactive NST with occasional self limiting decleration   reactive  Labs:  Results for orders placed during the hospital encounter of 07/15/13 (from the past 24 hour(s))  GLUCOSE, CAPILLARY   Collection Time    08/03/13 10:47 PM      Result Value Range   Glucose-Capillary 106 (*) 70 - 99 mg/dL  GLUCOSE,  CAPILLARY   Collection Time    08/04/13  7:52 AM      Result Value Range   Glucose-Capillary 82  70 - 99 mg/dL  GLUCOSE, CAPILLARY   Collection Time    08/04/13 10:12 AM      Result Value Range   Glucose-Capillary 90  70 - 99 mg/dL  GLUCOSE, CAPILLARY   Collection Time    08/04/13  3:07 PM      Result Value Range   Glucose-Capillary 131 (*) 70 - 99 mg/dL    Imaging Studies:   [id number] with stable Dopplers good fluid on 08/02/2013  Medications:  Scheduled . clindamycin  300 mg Oral Q6H  . docusate sodium  100 mg Oral Daily  . insulin aspart  0-30 Units Subcutaneous TID WC  . insulin aspart  0-32 Units Subcutaneous QAC breakfast  . insulin aspart  0-32 Units Subcutaneous TID PC  . insulin aspart  9-12 Units Subcutaneous Once  . insulin NPH  26 Units Subcutaneous QHS  . insulin NPH  38 Units Subcutaneous QAC breakfast  . labetalol  800 mg Oral TID  . NIFEdipine  30 mg Oral BID  . prenatal multivitamin  1 tablet Oral Q1200  . valACYclovir  500 mg Oral Daily   I have reviewed the patient's current medications.  ASSESSMENT: [redacted]w[redacted]d Estimated Date of Delivery: 09/07/13 Chronic hypertension, stable on labetalol 800 TID and Procardia XL 30 BID Boederline  fetal Dopplers with no absent or reverse flow  Patient Active Problem List   Diagnosis Date Noted  . Boil, leg 06/24/2013  . 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 Diabetes mellitus, antepartum 02/18/2013  . Benign essential hypertension antepartum 02/18/2013  . Supervision of high-risk pregnancy 02/04/2013  . Acute respiratory failure 10/24/2012  . DKA (diabetic ketoacidoses) 06/04/2012  . HTN (hypertension) 06/04/2012  . Hyperlipidemia 06/04/2012  . Diabetes mellitus 06/04/2012    PLAN: Continue current care plan, twice weekly sonographic evaluations with BPP/Dopplers Intervene if clinical situation deteriorates  EURE,LUTHER H 08/04/2013,4:16 PM

## 2013-08-05 LAB — GLUCOSE, CAPILLARY
Glucose-Capillary: 128 mg/dL — ABNORMAL HIGH (ref 70–99)
Glucose-Capillary: 142 mg/dL — ABNORMAL HIGH (ref 70–99)

## 2013-08-05 NOTE — Progress Notes (Signed)
Ur chart review completed.  

## 2013-08-05 NOTE — Progress Notes (Signed)
Patient ID: Rebecca Rhodes, female   DOB: [birth year], 32 y.o.   MRN: [id number] FACULTY PRACTICE ANTEPARTUM(COMPREHENSIVE) NOTE  Rebecca Rhodes is a 32 y.o. G1P0 with Estimated Date of Delivery: 09/07/13   By  early ultrasound [redacted]w[redacted]d  who is admitted for long term inpatient management of chronic hypertension and Class B diabetes, both now reasonably controlled.  UA Doppler studies have been chronically statistically elevated, >97%tile but no absent or reverse end diastolic flow has been noted..    Fetal presentation is cephalic. By soogram on 08/02/2013 Length of Stay:  21  Days  Date of admission:07/15/2013  Subjective: Pt is without complaints Patient reports the fetal movement as active. Patient reports uterine contraction  activity as none. Patient reports  vaginal bleeding as none. Patient describes fluid per vagina as None.  Vitals:  Blood pressure 130/99, pulse 81, temperature 97.6 F (36.4 C), temperature source Oral, resp. rate 18, height 5\' 4"  (1.626 m), weight 238 lb 6.4 oz (108.138 kg), last menstrual period 12/01/2012, SpO2 100.00%. Filed Vitals:   08/04/13 2004 08/04/13 2325 08/05/13 0622 08/05/13 0800  BP: 153/102 141/88 126/84 130/99  Pulse: 83 78 83 81  Temp: 98.4 F (36.9 C)  98 F (36.7 C) 97.6 F (36.4 C)  TempSrc: Oral  Oral Oral  Resp: 20 20 18 18   Height:      Weight:      SpO2:       Physical Examination:  General appearance - alert, well appearing, and in no distress Abdomen - soft, nontender, nondistended, no masses or organomegaly Normal gravid abdomen Fundal Height:  size equals dates  Fetal Monitoring:  reactive NST with occasional self limiting decleration   reactive  Labs:  Results for orders placed during the hospital encounter of 07/15/13 (from the past 24 hour(s))  GLUCOSE, CAPILLARY   Collection Time    08/04/13 10:12 AM      Result Value Range   Glucose-Capillary 90  70 - 99 mg/dL  GLUCOSE, CAPILLARY   Collection Time    08/04/13  3:07 PM   Result Value Range   Glucose-Capillary 131 (*) 70 - 99 mg/dL  GLUCOSE, CAPILLARY   Collection Time    08/04/13 10:37 PM      Result Value Range   Glucose-Capillary 153 (*) 70 - 99 mg/dL  GLUCOSE, CAPILLARY   Collection Time    08/05/13  8:02 AM      Result Value Range   Glucose-Capillary 99  70 - 99 mg/dL   Comment 1 Notify RN      Imaging Studies:   BPP10/10 with stable Dopplers good fluid on 08/02/2013  Medications:  Scheduled . clindamycin  300 mg Oral Q6H  . docusate sodium  100 mg Oral Daily  . insulin aspart  0-30 Units Subcutaneous TID WC  . insulin aspart  0-32 Units Subcutaneous QAC breakfast  . insulin aspart  0-32 Units Subcutaneous TID PC  . insulin aspart  9-12 Units Subcutaneous Once  . insulin NPH  26 Units Subcutaneous QHS  . insulin NPH  38 Units Subcutaneous QAC breakfast  . labetalol  800 mg Oral TID  . NIFEdipine  30 mg Oral BID  . prenatal multivitamin  1 tablet Oral Q1200  . valACYclovir  500 mg Oral Daily   I have reviewed the patient's current medications.  ASSESSMENT: [redacted]w[redacted]d Estimated Date of Delivery: 09/07/13 Chronic hypertension, stable on labetalol 800 TID and Procardia XL 30 BID Boederline fetal Dopplers with no absent or reverse flow  Patient Active Problem List   Diagnosis Date Noted  . Boil, leg 06/24/2013  . 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 Diabetes mellitus, antepartum 02/18/2013  . Benign essential hypertension antepartum 02/18/2013  . Supervision of high-risk pregnancy 02/04/2013  . Acute respiratory failure 10/24/2012  . DKA (diabetic ketoacidoses) 06/04/2012  . HTN (hypertension) 06/04/2012  . Hyperlipidemia 06/04/2012  . Diabetes mellitus 06/04/2012    PLAN: Continue current care plan, twice weekly sonographic evaluations with BPP/Dopplers Intervene if clinical situation deteriorates  EURE,LUTHER H 08/05/2013,8:11 AM

## 2013-08-06 ENCOUNTER — Inpatient Hospital Stay (HOSPITAL_COMMUNITY): Payer: Medicaid Other

## 2013-08-06 ENCOUNTER — Inpatient Hospital Stay (HOSPITAL_COMMUNITY): Admit: 2013-08-06 | Payer: Medicaid Other

## 2013-08-06 LAB — TYPE AND SCREEN: ABO/RH(D): O POS

## 2013-08-06 LAB — GLUCOSE, CAPILLARY
Glucose-Capillary: 70 mg/dL (ref 70–99)
Glucose-Capillary: 133 mg/dL — ABNORMAL HIGH (ref 70–99)
Glucose-Capillary: 102 mg/dL — ABNORMAL HIGH (ref 70–99)
Glucose-Capillary: 78 mg/dL (ref 70–99)
Glucose-Capillary: 118 mg/dL — ABNORMAL HIGH (ref 70–99)
Glucose-Capillary: 104 mg/dL — ABNORMAL HIGH (ref 70–99)

## 2013-08-06 MED ORDER — DEXTROSE 50 % IV SOLN
25.0000 mL | INTRAVENOUS | Status: DC | PRN
  Administered 2013-08-06: 25 mL via INTRAVENOUS
  Filled 2013-08-06: qty 50

## 2013-08-06 MED ORDER — OXYTOCIN 40 UNITS IN LACTATED RINGERS INFUSION - SIMPLE MED
62.5000 mL/h | INTRAVENOUS | Status: DC
  Filled 2013-08-06: qty 1000

## 2013-08-06 MED ORDER — IBUPROFEN 600 MG PO TABS: 600.0000 mg | ORAL_TABLET | Freq: Four times a day (QID) | ORAL | Status: DC | PRN

## 2013-08-06 MED ORDER — NIFEDIPINE ER 30 MG PO TB24
30.0000 mg | ORAL_TABLET | Freq: Two times a day (BID) | ORAL | Status: DC
  Administered 2013-08-06 – 2013-08-12 (×11): 30 mg via ORAL
  Filled 2013-08-06 (×13): qty 1

## 2013-08-06 MED ORDER — DEXTROSE-NACL 5-0.45 % IV SOLN
INTRAVENOUS | Status: DC
  Administered 2013-08-06 (×2): via INTRAVENOUS

## 2013-08-06 MED ORDER — INSULIN REGULAR BOLUS VIA INFUSION
0.0000 [IU] | Freq: Three times a day (TID) | INTRAVENOUS | Status: DC
  Administered 2013-08-07: 0 [IU] via INTRAVENOUS
  Filled 2013-08-06: qty 10

## 2013-08-06 MED ORDER — ZOLPIDEM TARTRATE 5 MG PO TABS
5.0000 mg | ORAL_TABLET | Freq: Every evening | ORAL | Status: DC | PRN
Start: 2013-08-06 — End: 2013-08-09
  Administered 2013-08-06: 5 mg via ORAL
  Filled 2013-08-06: qty 1

## 2013-08-06 MED ORDER — SODIUM CHLORIDE 0.9 % IV SOLN: INTRAVENOUS | Status: DC

## 2013-08-06 MED ORDER — VALACYCLOVIR HCL 500 MG PO TABS
500.0000 mg | ORAL_TABLET | Freq: Every day | ORAL | Status: DC
  Administered 2013-08-07 – 2013-08-08 (×2): 500 mg via ORAL
  Filled 2013-08-06 (×4): qty 1

## 2013-08-06 MED ORDER — MISOPROSTOL 25 MCG QUARTER TABLET: 25.0000 ug | ORAL_TABLET | Freq: Once | ORAL | Status: DC

## 2013-08-06 MED ORDER — SODIUM CHLORIDE 0.9 % IV SOLN
INTRAVENOUS | Status: DC
  Administered 2013-08-06: 5.8 [IU]/h via INTRAVENOUS
  Administered 2013-08-06: 0.1 [IU]/h via INTRAVENOUS
  Administered 2013-08-07: 0.6 [IU]/h via INTRAVENOUS
  Administered 2013-08-07: 1 [IU]/h via INTRAVENOUS
  Administered 2013-08-07: 1.7 [IU]/h via INTRAVENOUS
  Administered 2013-08-07: 1.8 [IU]/h via INTRAVENOUS
  Administered 2013-08-07: 1 [IU]/h via INTRAVENOUS
  Administered 2013-08-07: 2.7 [IU]/h via INTRAVENOUS
  Administered 2013-08-07: 0.5 [IU]/h via INTRAVENOUS
  Administered 2013-08-07: 1.1 [IU]/h via INTRAVENOUS
  Administered 2013-08-07: 4 [IU]/h via INTRAVENOUS
  Administered 2013-08-07: 3.8 [IU]/h via INTRAVENOUS
  Administered 2013-08-07: 3.6 [IU]/h via INTRAVENOUS
  Administered 2013-08-08: 1.1 [IU]/h via INTRAVENOUS
  Filled 2013-08-06 (×2): qty 1

## 2013-08-06 MED ORDER — LACTATED RINGERS IV SOLN
INTRAVENOUS | Status: DC
  Administered 2013-08-08: 15:00:00 via INTRAVENOUS

## 2013-08-06 MED ORDER — MISOPROSTOL 25 MCG QUARTER TABLET
25.0000 ug | ORAL_TABLET | ORAL | Status: DC | PRN
  Administered 2013-08-06: 25 ug via VAGINAL
  Filled 2013-08-06: qty 0.25

## 2013-08-06 MED ORDER — LABETALOL HCL 300 MG PO TABS
800.0000 mg | ORAL_TABLET | Freq: Three times a day (TID) | ORAL | Status: DC
  Administered 2013-08-06 – 2013-08-07 (×3): 800 mg via ORAL
  Filled 2013-08-06 (×3): qty 1

## 2013-08-06 MED ORDER — TERBUTALINE SULFATE 1 MG/ML IJ SOLN: 0.2500 mg | Freq: Once | INTRAMUSCULAR | Status: AC | PRN

## 2013-08-06 MED ORDER — OXYCODONE-ACETAMINOPHEN 5-325 MG PO TABS: 1.0000 | ORAL_TABLET | ORAL | Status: DC | PRN

## 2013-08-06 MED ORDER — CITRIC ACID-SODIUM CITRATE 334-500 MG/5ML PO SOLN
30.0000 mL | ORAL | Status: DC | PRN
  Administered 2013-08-08: 30 mL via ORAL

## 2013-08-06 MED ORDER — ONDANSETRON HCL 4 MG/2ML IJ SOLN
4.0000 mg | Freq: Four times a day (QID) | INTRAMUSCULAR | Status: DC | PRN
  Administered 2013-08-08: 4 mg via INTRAVENOUS
  Filled 2013-08-06: qty 2

## 2013-08-06 MED ORDER — HYDRALAZINE HCL 20 MG/ML IJ SOLN
5.0000 mg | Freq: Once | INTRAMUSCULAR | Status: AC
  Administered 2013-08-06: 5 mg via INTRAVENOUS
  Filled 2013-08-06: qty 1

## 2013-08-06 MED ORDER — LACTATED RINGERS IV SOLN: 500.0000 mL | INTRAVENOUS | Status: DC | PRN

## 2013-08-06 MED ORDER — OXYTOCIN BOLUS FROM INFUSION: 500.0000 mL | INTRAVENOUS | Status: DC

## 2013-08-06 MED ORDER — LIDOCAINE HCL (PF) 1 % IJ SOLN: 30.0000 mL | INTRAMUSCULAR | Status: DC | PRN

## 2013-08-06 MED ORDER — ACETAMINOPHEN 325 MG PO TABS: 650.0000 mg | ORAL_TABLET | ORAL | Status: DC | PRN

## 2013-08-06 NOTE — Progress Notes (Signed)
Pt received from dietary 71 carbs over the ideal 45 carbs  for dinner tray.  Carbs exceeded novalog meal coverage of up to 30 units.  Pt requires more supervision.

## 2013-08-06 NOTE — Progress Notes (Signed)
Rebecca Rhodes is a 32 y.o. G1P0 at [redacted]w[redacted]d   Subjective: No complaints. Asking for ambien to help her sleep tonight  Objective: BP 159/96  Pulse 74  Temp(Src) 98.3 F (36.8 C) (Oral)  Resp 20  Ht 5\' 4"  (1.626 m)  Wt 108.138 kg (238 lb 6.4 oz)  BMI 40.9 kg/m2  SpO2 100%  LMP 12/01/2012      FHT:  FHR: 130 bpm, variability: moderate,  accelerations:  Present,  decelerations:  Absent UC:   irregular, every 2-6 minutes SVE:   Dilation: 1 Effacement (%): 50 Station: -2 Exam by:: D. Poe, cnm  Labs: Lab Results  Component Value Date   WBC 9.1 07/29/2013   HGB 13.0 07/29/2013   HCT 38.6 07/29/2013   MCV 85.6 07/29/2013   PLT 240 07/29/2013    Assessment / Plan: Induction of labor due to gestational diabetes and CHTN, nonreassuring fetal testing ,  progressing well on pitocin  Labor: Foley bulb in, will sleep with [employer] and start pitocin in AM Preeclampsia:  NA Fetal Wellbeing:  Category II Pain Control:  Labor support without medications I/D:  n/a Anticipated MOD:  NSVD  Tawnya Crook 08/06/2013, 11:17 PM

## 2013-08-06 NOTE — Progress Notes (Addendum)
Rebecca Rhodes is a 32 y.o. G1P0 at [redacted]w[redacted]d admitted for induction of labor due to non-reassuring fetal testing.  Subjective: Comfortable. Not feeling contractions.   Objective: BP 165/108  Pulse 78  Temp(Src) 98 F (36.7 C) (Oral)  Resp 18  Ht 5\' 4"  (1.626 m)  Wt 108.138 kg (238 lb 6.4 oz)  BMI 40.9 kg/m2  SpO2 100%  LMP 12/01/2012      FHT:  FHR: 135 bpm, variability: minimal ,  accelerations:  Abscent,  decelerations:  Present multiple variables UC:   irregular, every 3-5 minutes SVE:   Dilation: 1 Effacement (%): 50 Station: -2 Exam by:: D. Poe, cnm  Labs: Lab Results  Component Value Date   WBC 9.1 07/29/2013   HGB 13.0 07/29/2013   HCT 38.6 07/29/2013   MCV 85.6 07/29/2013   PLT 240 07/29/2013    Assessment / Plan: Induction of labor due to non-reassuring fetal testing,  progressing on cytotec  Labor: Progressing with cytotec x1. Foley bulb placed. BPs continue to be high. Will reassess.  Preeclampsia:  labs stable Fetal Wellbeing:  Category I Pain Control:  Labor support without medications I/D:  Herpes simplex infection, prophylaxis with valcyclovir Anticipated MOD:  NSVD  Jacquelin Hawking 08/06/2013, 4:55 PM

## 2013-08-06 NOTE — Progress Notes (Signed)
Rebecca Rhodes  was seen today for an ultrasound appointment.  See full report in [facility].  Impression: Single IUP at 35 3/7 weeks CHTN, Class B GDM on insulin, lagging growth noted on previous ultrasound (8/11) Elevated UA Dopplers with some intermittent absent end-diastolic flow BPP 4/8 (-2 for tone, -2 for absent breathing movement) Normal amniotic fluid volume  Recommendations: Recommend delivery based on BPP and intermittend AEDF.  Alpha Gula, MD

## 2013-08-06 NOTE — H&P (Signed)
Rebecca Rhodes is a 32 y.o. female presenting for induction of labor due to non-reassuring fetal testing. Maternal Medical History:  Reason for admission: Nausea.  Prenatal Complications - Diabetes: type 2. Diabetes is managed by insulin injections.      OB History   Grav Para Term Preterm Abortions TAB SAB Ect Mult Living   1              Past Medical History  Diagnosis Date  . HTN (hypertension) 06/04/2012  . Hyperlipidemia 06/04/2012  . Diabetes mellitus 06/04/2012  . HSV-2 infection   . DKA (diabetic ketoacidoses)   . Acute respiratory failure    Past Surgical History  Procedure Laterality Date  . Irrigation and debridement abscess  10/31/2012    Procedure: IRRIGATION AND DEBRIDEMENT ABSCESS;  Surgeon: Cherylynn Ridges, MD;  Location: [facility] OR;  Service: General;  Laterality: Bilateral;   Family History: family history includes Cancer in her maternal grandfather; Heart disease in her father; Hypertension in her mother; Stroke in her mother. Social History:  reports that she has never smoked. She has never used smokeless tobacco. She reports that she does not drink alcohol or use illicit drugs.   Prenatal Transfer Tool  Maternal Diabetes: Yes:  Diabetes Type:  Pre-pregnancy Genetic Screening: Declined Maternal Ultrasounds/Referrals: Abnormal:  Findings:   Other: BPP: -2 for tone, -2 for breathing, reverse flow on dopplers Fetal Ultrasounds or other Referrals:  Fetal echo, Referred to Materal Fetal Medicine  Maternal Substance Abuse:  No Significant Maternal Medications:  Meds include: Other:  Labetalol, Procardia, valacyclovir Significant Maternal Lab Results:  None Other Comments:  None  Review of Systems  Constitutional: Negative for fever and chills.  Eyes: Negative for blurred vision.  Gastrointestinal: Negative for nausea, vomiting and abdominal pain.  Neurological: Negative for headaches.    Dilation: Fingertip Exam by:: m early rnc-ob Blood pressure 124/90, pulse 82,  temperature 98.3 F (36.8 C), temperature source Oral, resp. rate 18, height 5\' 4"  (1.626 m), weight 108.138 kg (238 lb 6.4 oz), last menstrual period 12/01/2012, SpO2 100.00%.   Fetal Exam Fetal Monitor Review: Mode: ultrasound.   Baseline rate: 130.  Variability: moderate (6-25 bpm).   Pattern: accelerations present and no decelerations.    Fetal State Assessment: Category I - tracings are normal.     Physical Exam  Constitutional: She is oriented to person, place, and time. She appears well-developed and well-nourished.  Cardiovascular: Normal rate, regular rhythm and normal heart sounds.   No murmur heard. Respiratory: Effort normal and breath sounds normal. No respiratory distress.  Musculoskeletal: She exhibits no edema and no tenderness.  Neurological: She is alert and oriented to person, place, and time.    Prenatal labs: ABO, Rh: --/--/O POS (08/26 4782) Antibody: NEG (08/26 0822) Rubella: 1.47 (02/24 0902) RPR: NON REAC (07/14 1032)  HBsAg: NEGATIVE (02/24 0902)  HIV: NON REACTIVE (07/14 1032)  GBS: Negative (02/24 0000)   Assessment/Plan: 32 yo G1 at [redacted]w[redacted]d with CHTN and pre-gestational diabetes admitted for induction of labor due to non-reassuring fetal testing. Currently with a non-favorable cervix  #Induction of labor - admit to birthing suite - admitting attending physician, Dr. Jolayne Panther - routine orders - cytotec PV to ripen cervix  #Chronic Hypertension - continue labetalol and procardia - monitor BPs  #Pre-gestational diabetes - start glucose stabilizer protocol  #HSV-2 infection - continue valacyclovir for prophylaxis  Jacquelin Hawking, MD 08/06/2013, 11:09 AM  Evaluation and management procedures were performed by Resident physician under my  supervision/collaboration. Chart reviewed, patient examined by me and I agree with management and plan.

## 2013-08-07 ENCOUNTER — Inpatient Hospital Stay (HOSPITAL_COMMUNITY): Payer: Medicaid Other | Admitting: Anesthesiology

## 2013-08-07 ENCOUNTER — Encounter (HOSPITAL_COMMUNITY): Payer: Self-pay | Admitting: Anesthesiology

## 2013-08-07 LAB — CBC
HCT: 39.1 % (ref 36.0–46.0)
MCH: 29.4 pg (ref 26.0–34.0)
MCHC: 34.3 g/dL (ref 30.0–36.0)
MCV: 85.7 fL (ref 78.0–100.0)
RDW: 15.8 % — ABNORMAL HIGH (ref 11.5–15.5)

## 2013-08-07 LAB — GLUCOSE, CAPILLARY
Glucose-Capillary: 100 mg/dL — ABNORMAL HIGH (ref 70–99)
Glucose-Capillary: 112 mg/dL — ABNORMAL HIGH (ref 70–99)
Glucose-Capillary: 115 mg/dL — ABNORMAL HIGH (ref 70–99)
Glucose-Capillary: 118 mg/dL — ABNORMAL HIGH (ref 70–99)
Glucose-Capillary: 119 mg/dL — ABNORMAL HIGH (ref 70–99)
Glucose-Capillary: 120 mg/dL — ABNORMAL HIGH (ref 70–99)
Glucose-Capillary: 197 mg/dL — ABNORMAL HIGH (ref 70–99)
Glucose-Capillary: 76 mg/dL (ref 70–99)
Glucose-Capillary: 87 mg/dL (ref 70–99)
Glucose-Capillary: 88 mg/dL (ref 70–99)
Glucose-Capillary: 94 mg/dL (ref 70–99)
Glucose-Capillary: 98 mg/dL (ref 70–99)
Glucose-Capillary: 99 mg/dL (ref 70–99)

## 2013-08-07 MED ORDER — FENTANYL 2.5 MCG/ML BUPIVACAINE 1/10 % EPIDURAL INFUSION (WH - ANES)
INTRAMUSCULAR | Status: DC | PRN
  Administered 2013-08-07: 14 mL/h via EPIDURAL

## 2013-08-07 MED ORDER — LACTATED RINGERS IV SOLN: 500.0000 mL | Freq: Once | INTRAVENOUS | Status: DC

## 2013-08-07 MED ORDER — LIDOCAINE HCL (PF) 1 % IJ SOLN
INTRAMUSCULAR | Status: DC | PRN
  Administered 2013-08-07 (×3): 5 mL

## 2013-08-07 MED ORDER — DIPHENHYDRAMINE HCL 50 MG/ML IJ SOLN
12.5000 mg | INTRAMUSCULAR | Status: DC | PRN
  Filled 2013-08-07: qty 1

## 2013-08-07 MED ORDER — SODIUM CHLORIDE 0.9 % IV SOLN: INTRAVENOUS | Status: DC

## 2013-08-07 MED ORDER — PHENYLEPHRINE 40 MCG/ML (10ML) SYRINGE FOR IV PUSH (FOR BLOOD PRESSURE SUPPORT): 80.0000 ug | PREFILLED_SYRINGE | INTRAVENOUS | Status: DC | PRN

## 2013-08-07 MED ORDER — OXYTOCIN 40 UNITS IN LACTATED RINGERS INFUSION - SIMPLE MED
1.0000 m[IU]/min | INTRAVENOUS | Status: DC
  Administered 2013-08-07: 5 m[IU]/min via INTRAVENOUS
  Administered 2013-08-07: 1 m[IU]/min via INTRAVENOUS
  Administered 2013-08-07: 3 m[IU]/min via INTRAVENOUS
  Administered 2013-08-07: 2 m[IU]/min via INTRAVENOUS
  Administered 2013-08-07: 4 m[IU]/min via INTRAVENOUS
  Administered 2013-08-08: 10 m[IU]/min via INTRAVENOUS
  Administered 2013-08-08: 6 m[IU]/min via INTRAVENOUS
  Filled 2013-08-07: qty 1000

## 2013-08-07 MED ORDER — LABETALOL HCL 300 MG PO TABS
800.0000 mg | ORAL_TABLET | Freq: Three times a day (TID) | ORAL | Status: DC
  Administered 2013-08-07 – 2013-08-12 (×13): 800 mg via ORAL
  Filled 2013-08-07 (×18): qty 1

## 2013-08-07 MED ORDER — DEXTROSE IN LACTATED RINGERS 5 % IV SOLN
INTRAVENOUS | Status: DC
  Administered 2013-08-07: 09:00:00 via INTRAVENOUS
  Administered 2013-08-07: 125 mL/h via INTRAVENOUS
  Administered 2013-08-08 (×2): via INTRAVENOUS

## 2013-08-07 MED ORDER — EPHEDRINE 5 MG/ML INJ: 10.0000 mg | INTRAVENOUS | Status: DC | PRN

## 2013-08-07 MED ORDER — EPHEDRINE 5 MG/ML INJ
10.0000 mg | INTRAVENOUS | Status: DC | PRN
  Filled 2013-08-07: qty 4

## 2013-08-07 MED ORDER — HYDRALAZINE HCL 20 MG/ML IJ SOLN
10.0000 mg | INTRAMUSCULAR | Status: DC | PRN
  Administered 2013-08-08: 10 mg via INTRAVENOUS
  Filled 2013-08-07: qty 1

## 2013-08-07 MED ORDER — FENTANYL 2.5 MCG/ML BUPIVACAINE 1/10 % EPIDURAL INFUSION (WH - ANES)
14.0000 mL/h | INTRAMUSCULAR | Status: DC | PRN
  Administered 2013-08-08: 14 mL/h via EPIDURAL
  Filled 2013-08-07 (×3): qty 125

## 2013-08-07 MED ORDER — PHENYLEPHRINE 40 MCG/ML (10ML) SYRINGE FOR IV PUSH (FOR BLOOD PRESSURE SUPPORT)
80.0000 ug | PREFILLED_SYRINGE | INTRAVENOUS | Status: DC | PRN
  Filled 2013-08-07: qty 5

## 2013-08-07 NOTE — Progress Notes (Signed)
Notified Dr. Shawnie Pons of Bp elevations of > 143/107.  Order given to continue monitoring Bps.  May receive hydralazine if Bp becomes 180/110.  Will continue to monitor.

## 2013-08-07 NOTE — Progress Notes (Signed)
Rebecca Rhodes is a 32 y.o. G1P0 at [redacted]w[redacted]d  admitted for induction of labor due to Non-reactive NST, Diabetes and Hypertension.  Subjective:   Objective: BP 136/92  Pulse 79  Temp(Src) 98.2 F (36.8 C) (Oral)  Resp 22  Ht 5\' 4"  (1.626 m)  Wt 108.138 kg (238 lb 6.4 oz)  BMI 40.9 kg/m2  SpO2 99%  LMP 12/01/2012   Total I/O In: 750 [P.O.:750] Out: 1000 [Urine:1000]  FHT:  FHR: 130s bpm, variability: moderate,  accelerations:  Abscent,  decelerations:  Absent UC:   regular, every 3 minutes SVE:   Dilation: 3.5 Effacement (%): 50 Station: -2 Exam by:: Gildardo Griffes, M.D. Unchanged at 1606 Labs: Lab Results  Component Value Date   WBC 9.1 07/29/2013   HGB 13.0 07/29/2013   HCT 38.6 07/29/2013   MCV 85.6 07/29/2013   PLT 240 07/29/2013    Assessment / Plan: Induction of labor due to non-reassuring fetal testing, gestational diabetes and chronic HTN,  progressing well on pitocin  Labor: Continue Foley bulb in place. Unable to remove. Contracting spontanouesly continue to monitor Preeclampsia:  no signs or symptoms of toxicity and continue BP meds and IV meds PRN for severe range. Monitor for sings of PreX Fetal Wellbeing:  Category I Pain Control:  Labor support without medications I/D: continue valtrex Anticipated MOD:  NSVD   Rebecca Rhodes RYAN 08/07/2013, 4:05 PM

## 2013-08-07 NOTE — Progress Notes (Addendum)
Notified Dr. Shawnie Pons that pt. Experiences late decels.  Pitocin set at 5 mu/min. On alaris pump.  Pt. Denies experiencing pain or discomfort with contractions.  FHR 140 with +FM.  No orders given at this time.l  Dr. Shawnie Pons verbalized that she will come to see pt.   Applied pulse ox probe to finger to assess O2 sats.

## 2013-08-07 NOTE — Progress Notes (Signed)
Dr. Ike Bene here to perform cervix exam.  Cervix 3cm with foley bulb in place.  Notified Dr. Ike Bene of elevated Bps.  All Bps have been < 180/110 this shift to this time.  No orders given.

## 2013-08-07 NOTE — Progress Notes (Signed)
OOB to BR.  Tolerated ambulation well.  Has two alaris pumps to manage the four IV fluids.

## 2013-08-07 NOTE — Anesthesia Preprocedure Evaluation (Addendum)
Anesthesia Evaluation  Patient identified by MRN, date of birth, ID band Patient awake    Reviewed: Allergy & Precautions, H&P , NPO status , Patient's Chart, lab work & pertinent test results  History of Anesthesia Complications Negative for: history of anesthetic complications  Airway Mallampati: II TM Distance: >3 FB Neck ROM: full    Dental no notable dental hx. (+) Teeth Intact   Pulmonary neg pulmonary ROS,  breath sounds clear to auscultation  Pulmonary exam normal       Cardiovascular hypertension, On Home Beta Blockers Rhythm:regular Rate:Normal     Neuro/Psych negative neurological ROS  negative psych ROS   GI/Hepatic negative GI ROS, Neg liver ROS,   Endo/Other  diabetes, Poorly Controlled, Insulin DependentMorbid obesity  Renal/GU negative Renal ROS  negative genitourinary   Musculoskeletal   Abdominal Normal abdominal exam  (+)   Peds  Hematology negative hematology ROS (+)   Anesthesia Other Findings   Reproductive/Obstetrics (+) Pregnancy (fetal intolerance to labor --> C/S)                          Anesthesia Physical Anesthesia Plan  ASA: III and emergent  Anesthesia Plan: Epidural   Post-op Pain Management:    Induction:   Airway Management Planned:   Additional Equipment:   Intra-op Plan:   Post-operative Plan:   Informed Consent: I have reviewed the patients History and Physical, chart, labs and discussed the procedure including the risks, benefits and alternatives for the proposed anesthesia with the patient or authorized representative who has indicated his/her understanding and acceptance.     Plan Discussed with: Surgeon and CRNA  Anesthesia Plan Comments:        Anesthesia Quick Evaluation

## 2013-08-07 NOTE — Progress Notes (Signed)
Dr. Ike Bene here to talk with pt. About POC.  After foley bulb falls out Dr. Ike Bene to perform AROM and possibly amnioinfusion.

## 2013-08-07 NOTE — Anesthesia Procedure Notes (Signed)
Epidural Patient location during procedure: OB  Staffing Anesthesiologist: Calib Wadhwa Performed by: anesthesiologist   Preanesthetic Checklist Completed: patient identified, site marked, surgical consent, pre-op evaluation, timeout performed, IV checked, risks and benefits discussed and monitors and equipment checked  Epidural Patient position: sitting Prep: ChloraPrep Patient monitoring: heart rate, continuous pulse ox and blood pressure Approach: right paramedian Injection technique: LOR saline  Needle:  Needle type: Tuohy  Needle gauge: 17 G Needle length: 9 cm and 9 Needle insertion depth: 8 cm Catheter type: closed end flexible Catheter size: 20 Guage Catheter at skin depth: 13 cm Test dose: negative  Assessment Events: blood not aspirated, injection not painful, no injection resistance, negative IV test and no paresthesia  Additional Notes   Patient tolerated the insertion well without complications.   

## 2013-08-08 ENCOUNTER — Encounter (HOSPITAL_COMMUNITY)
Admission: AD | Disposition: A | Discharge status: Home / Self Care | Payer: Self-pay | Source: Ambulatory Visit | Attending: Obstetrics & Gynecology

## 2013-08-08 ENCOUNTER — Encounter (HOSPITAL_COMMUNITY): Payer: Self-pay | Admitting: Anesthesiology

## 2013-08-08 LAB — GLUCOSE, CAPILLARY
Glucose-Capillary: 105 mg/dL — ABNORMAL HIGH (ref 70–99)
Glucose-Capillary: 105 mg/dL — ABNORMAL HIGH (ref 70–99)
Glucose-Capillary: 116 mg/dL — ABNORMAL HIGH (ref 70–99)
Glucose-Capillary: 124 mg/dL — ABNORMAL HIGH (ref 70–99)
Glucose-Capillary: 103 mg/dL — ABNORMAL HIGH (ref 70–99)
Glucose-Capillary: 98 mg/dL (ref 70–99)
Glucose-Capillary: 91 mg/dL (ref 70–99)
Glucose-Capillary: 100 mg/dL — ABNORMAL HIGH (ref 70–99)
Glucose-Capillary: 97 mg/dL (ref 70–99)
Glucose-Capillary: 99 mg/dL (ref 70–99)
Glucose-Capillary: 94 mg/dL (ref 70–99)
Glucose-Capillary: 100 mg/dL — ABNORMAL HIGH (ref 70–99)

## 2013-08-08 SURGERY — Surgical Case
Anesthesia: Epidural

## 2013-08-08 MED ORDER — GENTAMICIN SULFATE 40 MG/ML IJ SOLN
INTRAVENOUS | Status: AC
  Administered 2013-08-08: 100 mL via INTRAVENOUS
  Filled 2013-08-08: qty 9.51

## 2013-08-08 MED ORDER — OXYTOCIN 40 UNITS IN LACTATED RINGERS INFUSION - SIMPLE MED
1.0000 m[IU]/min | INTRAVENOUS | Status: DC
  Administered 2013-08-08: 19 m[IU]/min via INTRAVENOUS

## 2013-08-08 MED ORDER — SODIUM BICARBONATE 8.4 % IV SOLN
INTRAVENOUS | Status: DC | PRN
  Administered 2013-08-08 (×3): 5 mL via EPIDURAL

## 2013-08-08 SURGICAL SUPPLY — 33 items
CLAMP CORD UMBIL (MISCELLANEOUS) IMPLANT
CLOTH BEACON ORANGE TIMEOUT ST (SAFETY) ×2 IMPLANT
DRAPE LG THREE QUARTER DISP (DRAPES) ×2 IMPLANT
DRSG OPSITE POSTOP 4X10 (GAUZE/BANDAGES/DRESSINGS) ×2 IMPLANT
DURAPREP 26ML APPLICATOR (WOUND CARE) ×2 IMPLANT
ELECT REM PT RETURN 9FT ADLT (ELECTROSURGICAL) ×2
ELECTRODE REM PT RTRN 9FT ADLT (ELECTROSURGICAL) ×1 IMPLANT
EXTRACTOR VACUUM M CUP 4 TUBE (SUCTIONS) IMPLANT
GLOVE BIO SURGEON STRL SZ7 (GLOVE) ×2 IMPLANT
GLOVE BIOGEL PI IND STRL 7.0 (GLOVE) ×1 IMPLANT
GLOVE BIOGEL PI INDICATOR 7.0 (GLOVE) ×1
GOWN STRL REIN XL XLG (GOWN DISPOSABLE) ×4 IMPLANT
KIT ABG SYR 3ML LUER SLIP (SYRINGE) IMPLANT
NDL HYPO 25X5/8 SAFETYGLIDE (NEEDLE) ×1 IMPLANT
NEEDLE HYPO 22GX1.5 SAFETY (NEEDLE) ×2 IMPLANT
NEEDLE HYPO 25X5/8 SAFETYGLIDE (NEEDLE) ×2 IMPLANT
NS IRRIG 1000ML POUR BTL (IV SOLUTION) ×2 IMPLANT
PACK C SECTION WH (CUSTOM PROCEDURE TRAY) ×2 IMPLANT
PAD ABD 7.5X8 STRL (GAUZE/BANDAGES/DRESSINGS) ×2 IMPLANT
PAD OB MATERNITY 4.3X12.25 (PERSONAL CARE ITEMS) ×2 IMPLANT
RTRCTR C-SECT PINK 25CM LRG (MISCELLANEOUS) IMPLANT
STAPLER VISISTAT 35W (STAPLE) IMPLANT
SUT PDS AB 0 CT1 27 (SUTURE) IMPLANT
SUT PDS AB 0 CTX 36 PDP370T (SUTURE) IMPLANT
SUT PLAIN 2 0 XLH (SUTURE) IMPLANT
SUT VIC AB 0 CT1 36 (SUTURE) ×4 IMPLANT
SUT VIC AB 0 CTX 36 (SUTURE) ×4
SUT VIC AB 0 CTX36XBRD ANBCTRL (SUTURE) ×2 IMPLANT
SUT VIC AB 4-0 KS 27 (SUTURE) ×2 IMPLANT
SYR 30ML LL (SYRINGE) ×2 IMPLANT
TOWEL OR 17X24 6PK STRL BLUE (TOWEL DISPOSABLE) ×2 IMPLANT
TRAY FOLEY CATH 14FR (SET/KITS/TRAYS/PACK) ×2 IMPLANT
WATER STERILE IRR 1000ML POUR (IV SOLUTION) ×2 IMPLANT

## 2013-08-08 NOTE — Progress Notes (Signed)
ATTENDING PROGRESS NOTE  Rebecca Rhodes is a 32 y.o. G1P0 at [redacted]w[redacted]d  admitted for induction of labor due to Diabetes and Hypertension.  Subjective: Patient just had about 10 minutes of late decelerations with frequent contractions.  Minimal variability. Pitocin turned off, intrauterine neonatal resuscitation maneuvers are in progress.    Objective: BP 111/71  Pulse 83  Temp(Src) 98.7 F (37.1 C) (Oral)  Resp 16  Ht 5\' 4"  (1.626 m)  Wt 238 lb 6.4 oz (108.138 kg)  BMI 40.9 kg/m2  SpO2 99%  LMP 12/01/2012 Total I/O In: -  Out: 650 [Urine:650]  FHT:  FHR: 130s bpm, variability: minimal,  accelerations:  none,  decelerations:  late  UC:   MVU currently 190  SVE:   Dilation: 4.5 Effacement (%): 90 Station: -2 Exam by:: Drenda Freeze CNM Pitocin @ 4mu/min  Labs: Lab Results  Component Value Date   WBC 11.8* 08/07/2013   HGB 13.4 08/07/2013   HCT 39.1 08/07/2013   MCV 85.7 08/07/2013   PLT 282 08/07/2013    Assessment / Plan: Induction of labor due to Class B DM  and CHTN at [redacted]w[redacted]d, now with fetal intolerance of labor.  Cesarean delivery recommended.  The risks of cesarean section discussed with the patient included but were not limited to: bleeding which may require transfusion or reoperation; infection which may require antibiotics; injury to bowel, bladder, ureters or other surrounding organs; injury to the fetus; need for additional procedures including hysterectomy in the event of a life-threatening hemorrhage; placental abnormalities wth subsequent pregnancies, incisional problems, thromboembolic phenomenon and other postoperative/anesthesia complications. The patient concurred with the proposed plan, giving informed written consent for the procedure.   Anesthesia and OR aware. Preoperative prophylactic antibiotics and SCDs ordered on call to the OR.  To OR when ready.  Will continue to monitor. If still severe range BP after delivery, will start magnesium sulfate for eclampsia prophylaxis.     Tereso Newcomer, MD 08/08/2013, 11:28 PM

## 2013-08-08 NOTE — Progress Notes (Signed)
LABOR PROGRESS NOTE  Rebecca Rhodes is a 32 y.o. G1P0 at [redacted]w[redacted]d  admitted for induction of labor due to Diabetes and Hypertension.  Subjective:  Pt comfortable with epidural. +FM  Objective: BP 124/80  Pulse 78  Temp(Src) 98 F (36.7 C) (Oral)  Resp 20  Ht 5\' 4"  (1.626 m)  Wt 108.138 kg (238 lb 6.4 oz)  BMI 40.9 kg/m2  SpO2 98%  LMP 12/01/2012    FHT:  FHR: 130 bpm, variability: minimal with periods of moderate,  accelerations:  Present,  decelerations:  Present variable decels UC:   regular, every 2--4 minutes SVE:   Dilation: 4 Effacement (%): 80 Station: -2 Exam by:: Samaiyah Howes Pitocin @ 18 mu/min  Labs: Lab Results  Component Value Date   WBC 11.8* 08/07/2013   HGB 13.4 08/07/2013   HCT 39.1 08/07/2013   MCV 85.7 08/07/2013   PLT 282 08/07/2013    Assessment / Plan: Induction of labor due to DM class B and CHTN,   Labor: cervix thinned and softened since last check, Now with recurrent variables. Will d/c pit for now. IUPC placed without difficulty and amnioinfusion started.  Fetal Wellbeing:  Category II Pain Control:  Epidural Anticipated MOD:  NSVD  Ruthanna Macchia L, MD 08/08/2013, 9:46 AM

## 2013-08-08 NOTE — Progress Notes (Signed)
Patient ID: Rebecca Rhodes, female   DOB: 1981/11/14, 32 y.o.   MRN: [id number] LABOR PROGRESS NOTE  Kuulei Kleier is a 32 y.o. G1P0 at [redacted]w[redacted]d  admitted for induction of labor due to Diabetes and Hypertension.  Subjective:  Pt comfortable, no complaints. Currently eating clear liquid diet.  Objective: BP 161/100  Pulse 77  Temp(Src) 98.7 F (37.1 C) (Oral)  Resp 16  Ht 5\' 4"  (1.626 m)  Wt 108.138 kg (238 lb 6.4 oz)  BMI 40.9 kg/m2  SpO2 99%  LMP 12/01/2012    FHT:  FHR: 130s bpm, variability: moderate,  accelerations:  Present,  decelerations:  Present some variables, none in last hour  UC:   MVU currently 100  SVE:   Dilation: 4.5 Effacement (%): 90 Station: -2 Exam by:: Drenda Freeze CNM Pitocin @ 73mu/min  Labs: Lab Results  Component Value Date   WBC 11.8* 08/07/2013   HGB 13.4 08/07/2013   HCT 39.1 08/07/2013   MCV 85.7 08/07/2013   PLT 282 08/07/2013    Assessment / Plan: Induction of labor due to DM class B and CHTN  Labor: MVU's not strong enough currently. Will continue to increase pitocin as baby tolerates. May yet require c-section if baby does not tolerate increase in pitocin. HTN: labetalol 800mg  q8h, procardia xl 30mg  BID, continue hydralazine 10mg  q4h prn IV BP >160/105 Fetal Wellbeing:  Category II Pain Control:  Epidural Anticipated MOD:  NSVD  Levert Feinstein, MD 08/08/2013, 9:49 PM

## 2013-08-08 NOTE — Progress Notes (Signed)
Patient ID: Rebecca Rhodes, female   DOB: [birth year], 32 y.o.   MRN: [id number] LABOR PROGRESS NOTE  Rebecca Rhodes is a 32 y.o. G1P0 at [redacted]w[redacted]d  admitted for induction of labor due to Diabetes and Hypertension.  Subjective:  Pt comfortable with epidural. +FM. Started back on pitocin awhile ago.   Objective: BP 118/81  Pulse 80  Temp(Src) 98.1 F (36.7 C) (Oral)  Resp 20  Ht 5\' 4"  (1.626 m)  Wt 108.138 kg (238 lb 6.4 oz)  BMI 40.9 kg/m2  SpO2 99%  LMP 12/01/2012    FHT:  FHR: 130 bpm, variability: moderate,  accelerations:  Present,  decelerations:  Absent but for a variable when checking her cervix   UC:   regular, every 3-6 minutes  SVE:   Dilation: 4.5 Effacement (%): 90 Station: -2 Exam by:: Bernie Covey MD Pitocin @ 71mu/min  Labs: Lab Results  Component Value Date   WBC 11.8* 08/07/2013   HGB 13.4 08/07/2013   HCT 39.1 08/07/2013   MCV 85.7 08/07/2013   PLT 282 08/07/2013    Assessment / Plan: Induction of labor due to DM class B and CHTN,   Labor: off pit until 3 hours ago, now slowly increasing. MVUs not adequate. Cervix slightly changed. will titrate pit for adequate ctx.  Fetal Wellbeing:  Category I Pain Control:  Epidural Anticipated MOD:  NSVD  Lash Matulich L, MD 08/08/2013, 4:29 PM

## 2013-08-09 ENCOUNTER — Encounter (HOSPITAL_COMMUNITY): Payer: Self-pay | Admitting: *Deleted

## 2013-08-09 ENCOUNTER — Other Ambulatory Visit (HOSPITAL_COMMUNITY): Payer: Medicaid Other

## 2013-08-09 DIAGNOSIS — O2432 Unspecified pre-existing diabetes mellitus in childbirth: Secondary | ICD-10-CM

## 2013-08-09 DIAGNOSIS — O1002 Pre-existing essential hypertension complicating childbirth: Secondary | ICD-10-CM

## 2013-08-09 DIAGNOSIS — O98519 Other viral diseases complicating pregnancy, unspecified trimester: Secondary | ICD-10-CM

## 2013-08-09 LAB — CBC
MCHC: 34 g/dL (ref 30.0–36.0)
MCV: 86.4 fL (ref 78.0–100.0)
Platelets: 272 10*3/uL (ref 150–400)
RDW: 15.5 % (ref 11.5–15.5)
WBC: 15.4 10*3/uL — ABNORMAL HIGH (ref 4.0–10.5)

## 2013-08-09 LAB — GLUCOSE, CAPILLARY
Glucose-Capillary: 122 mg/dL — ABNORMAL HIGH (ref 70–99)
Glucose-Capillary: 194 mg/dL — ABNORMAL HIGH (ref 70–99)

## 2013-08-09 MED ORDER — SODIUM CHLORIDE 0.9 % IJ SOLN: 3.0000 mL | INTRAMUSCULAR | Status: DC | PRN

## 2013-08-09 MED ORDER — SODIUM CHLORIDE 0.9 % IJ SOLN
INTRAMUSCULAR | Status: AC
  Filled 2013-08-09: qty 3

## 2013-08-09 MED ORDER — MORPHINE SULFATE 0.5 MG/ML IJ SOLN
INTRAMUSCULAR | Status: AC
  Filled 2013-08-09: qty 10

## 2013-08-09 MED ORDER — SCOPOLAMINE 1 MG/3DAYS TD PT72
MEDICATED_PATCH | TRANSDERMAL | Status: AC
  Filled 2013-08-09: qty 1

## 2013-08-09 MED ORDER — BUPIVACAINE HCL (PF) 0.5 % IJ SOLN
INTRAMUSCULAR | Status: AC
  Filled 2013-08-09: qty 30

## 2013-08-09 MED ORDER — IBUPROFEN 600 MG PO TABS
600.0000 mg | ORAL_TABLET | Freq: Four times a day (QID) | ORAL | Status: DC
  Administered 2013-08-09 – 2013-08-12 (×11): 600 mg via ORAL
  Filled 2013-08-09 (×12): qty 1

## 2013-08-09 MED ORDER — OXYCODONE-ACETAMINOPHEN 5-325 MG PO TABS
1.0000 | ORAL_TABLET | ORAL | Status: DC | PRN
  Administered 2013-08-09: 1 via ORAL
  Administered 2013-08-10 – 2013-08-11 (×3): 2 via ORAL
  Filled 2013-08-09: qty 1
  Filled 2013-08-09 (×3): qty 2

## 2013-08-09 MED ORDER — KETOROLAC TROMETHAMINE 60 MG/2ML IM SOLN
INTRAMUSCULAR | Status: AC
  Filled 2013-08-09: qty 2

## 2013-08-09 MED ORDER — WITCH HAZEL-GLYCERIN EX PADS: 1.0000 "application " | MEDICATED_PAD | CUTANEOUS | Status: DC | PRN

## 2013-08-09 MED ORDER — ONDANSETRON HCL 4 MG/2ML IJ SOLN: 4.0000 mg | Freq: Three times a day (TID) | INTRAMUSCULAR | Status: DC | PRN

## 2013-08-09 MED ORDER — MEPERIDINE HCL 25 MG/ML IJ SOLN
INTRAMUSCULAR | Status: AC
  Filled 2013-08-09: qty 1

## 2013-08-09 MED ORDER — OXYTOCIN 10 UNIT/ML IJ SOLN
40.0000 [IU] | INTRAVENOUS | Status: DC | PRN
  Administered 2013-08-09: 40 [IU] via INTRAVENOUS

## 2013-08-09 MED ORDER — FENTANYL CITRATE 0.05 MG/ML IJ SOLN
25.0000 ug | INTRAMUSCULAR | Status: DC | PRN
  Administered 2013-08-09 (×2): 50 ug via INTRAVENOUS

## 2013-08-09 MED ORDER — DIBUCAINE 1 % RE OINT: 1.0000 "application " | TOPICAL_OINTMENT | RECTAL | Status: DC | PRN

## 2013-08-09 MED ORDER — MAGNESIUM HYDROXIDE 400 MG/5ML PO SUSP
30.0000 mL | ORAL | Status: DC | PRN
  Administered 2013-08-11 – 2013-08-12 (×2): 30 mL via ORAL
  Filled 2013-08-09 (×2): qty 30

## 2013-08-09 MED ORDER — ZOLPIDEM TARTRATE 5 MG PO TABS: 5.0000 mg | ORAL_TABLET | Freq: Every evening | ORAL | Status: DC | PRN

## 2013-08-09 MED ORDER — DIPHENHYDRAMINE HCL 25 MG PO CAPS
25.0000 mg | ORAL_CAPSULE | ORAL | Status: DC | PRN
  Administered 2013-08-09: 25 mg via ORAL
  Filled 2013-08-09: qty 1

## 2013-08-09 MED ORDER — NALOXONE HCL 0.4 MG/ML IJ SOLN: 0.4000 mg | INTRAMUSCULAR | Status: DC | PRN

## 2013-08-09 MED ORDER — FENTANYL CITRATE 0.05 MG/ML IJ SOLN
INTRAMUSCULAR | Status: AC
  Filled 2013-08-09: qty 2

## 2013-08-09 MED ORDER — KETOROLAC TROMETHAMINE 30 MG/ML IJ SOLN: 30.0000 mg | Freq: Four times a day (QID) | INTRAMUSCULAR | Status: AC | PRN

## 2013-08-09 MED ORDER — LANOLIN HYDROUS EX OINT: 1.0000 "application " | TOPICAL_OINTMENT | CUTANEOUS | Status: DC | PRN

## 2013-08-09 MED ORDER — DIPHENHYDRAMINE HCL 25 MG PO CAPS: 25.0000 mg | ORAL_CAPSULE | Freq: Four times a day (QID) | ORAL | Status: DC | PRN

## 2013-08-09 MED ORDER — MORPHINE SULFATE (PF) 0.5 MG/ML IJ SOLN
INTRAMUSCULAR | Status: DC | PRN
  Administered 2013-08-09: 4 mg via EPIDURAL

## 2013-08-09 MED ORDER — OXYTOCIN 10 UNIT/ML IJ SOLN
INTRAMUSCULAR | Status: AC
  Filled 2013-08-09: qty 4

## 2013-08-09 MED ORDER — OXYTOCIN 40 UNITS IN LACTATED RINGERS INFUSION - SIMPLE MED: 125.0000 mL/h | INTRAVENOUS | Status: AC

## 2013-08-09 MED ORDER — ONDANSETRON HCL 4 MG PO TABS: 4.0000 mg | ORAL_TABLET | ORAL | Status: DC | PRN

## 2013-08-09 MED ORDER — PRENATAL MULTIVITAMIN CH
1.0000 | ORAL_TABLET | Freq: Every day | ORAL | Status: DC
  Administered 2013-08-09 – 2013-08-11 (×3): 1 via ORAL
  Filled 2013-08-09 (×3): qty 1

## 2013-08-09 MED ORDER — DIPHENHYDRAMINE HCL 50 MG/ML IJ SOLN
25.0000 mg | INTRAMUSCULAR | Status: DC | PRN
Start: 2013-08-09 — End: 2013-08-12

## 2013-08-09 MED ORDER — MEPERIDINE HCL 25 MG/ML IJ SOLN
6.2500 mg | INTRAMUSCULAR | Status: AC | PRN
  Administered 2013-08-09 (×2): 6.25 mg via INTRAVENOUS

## 2013-08-09 MED ORDER — DIPHENHYDRAMINE HCL 50 MG/ML IJ SOLN: 12.5000 mg | INTRAMUSCULAR | Status: DC | PRN

## 2013-08-09 MED ORDER — NALBUPHINE HCL 10 MG/ML IJ SOLN
5.0000 mg | INTRAMUSCULAR | Status: DC | PRN
  Administered 2013-08-09: 5 mg via SUBCUTANEOUS
  Filled 2013-08-09 (×2): qty 1

## 2013-08-09 MED ORDER — MORPHINE SULFATE (PF) 0.5 MG/ML IJ SOLN
INTRAMUSCULAR | Status: DC | PRN
  Administered 2013-08-09: 1 mg via INTRAVENOUS

## 2013-08-09 MED ORDER — ONDANSETRON HCL 4 MG/2ML IJ SOLN: 4.0000 mg | INTRAMUSCULAR | Status: DC | PRN

## 2013-08-09 MED ORDER — KETOROLAC TROMETHAMINE 60 MG/2ML IM SOLN
60.0000 mg | Freq: Once | INTRAMUSCULAR | Status: AC | PRN
  Administered 2013-08-09: 60 mg via INTRAMUSCULAR

## 2013-08-09 MED ORDER — LACTATED RINGERS IV SOLN
INTRAVENOUS | Status: DC | PRN
  Administered 2013-08-09: via INTRAVENOUS

## 2013-08-09 MED ORDER — ONDANSETRON HCL 4 MG/2ML IJ SOLN
INTRAMUSCULAR | Status: AC
  Filled 2013-08-09: qty 2

## 2013-08-09 MED ORDER — HYDRALAZINE HCL 20 MG/ML IJ SOLN
10.0000 mg | INTRAMUSCULAR | Status: DC | PRN
  Administered 2013-08-09: 10 mg via INTRAVENOUS
  Filled 2013-08-09: qty 1

## 2013-08-09 MED ORDER — SODIUM BICARBONATE 8.4 % IV SOLN
INTRAVENOUS | Status: AC
  Filled 2013-08-09: qty 50

## 2013-08-09 MED ORDER — MEASLES, MUMPS & RUBELLA VAC ~~LOC~~ INJ
0.5000 mL | INJECTION | Freq: Once | SUBCUTANEOUS | Status: DC
  Filled 2013-08-09: qty 0.5

## 2013-08-09 MED ORDER — NALOXONE HCL 1 MG/ML IJ SOLN
1.0000 ug/kg/h | INTRAVENOUS | Status: DC | PRN
  Filled 2013-08-09: qty 2

## 2013-08-09 MED ORDER — SIMETHICONE 80 MG PO CHEW
80.0000 mg | CHEWABLE_TABLET | ORAL | Status: DC | PRN
  Administered 2013-08-10 – 2013-08-11 (×3): 80 mg via ORAL

## 2013-08-09 MED ORDER — NALBUPHINE HCL 10 MG/ML IJ SOLN
5.0000 mg | INTRAMUSCULAR | Status: DC | PRN
  Filled 2013-08-09: qty 1

## 2013-08-09 MED ORDER — MENTHOL 3 MG MT LOZG: 1.0000 | LOZENGE | OROMUCOSAL | Status: DC | PRN

## 2013-08-09 MED ORDER — SCOPOLAMINE 1 MG/3DAYS TD PT72
1.0000 | MEDICATED_PATCH | Freq: Once | TRANSDERMAL | Status: AC
  Administered 2013-08-09: 1.5 mg via TRANSDERMAL

## 2013-08-09 MED ORDER — LIDOCAINE-EPINEPHRINE (PF) 2 %-1:200000 IJ SOLN
INTRAMUSCULAR | Status: AC
  Filled 2013-08-09: qty 20

## 2013-08-09 MED ORDER — METOCLOPRAMIDE HCL 5 MG/ML IJ SOLN: 10.0000 mg | Freq: Three times a day (TID) | INTRAMUSCULAR | Status: DC | PRN

## 2013-08-09 MED ORDER — SENNOSIDES-DOCUSATE SODIUM 8.6-50 MG PO TABS
2.0000 | ORAL_TABLET | Freq: Every day | ORAL | Status: DC
  Administered 2013-08-09 – 2013-08-11 (×3): 2 via ORAL

## 2013-08-09 MED ORDER — BUPIVACAINE HCL (PF) 0.5 % IJ SOLN
INTRAMUSCULAR | Status: DC | PRN
  Administered 2013-08-09: 30 mL

## 2013-08-09 NOTE — Lactation Note (Signed)
This note was copied from the chart of Rebecca Maille Halliwell. Lactation Consultation Note    Follow up consul;t with this mom and her baby, now 17 hours post aprtum, and 35 6/[redacted] weeks gestation. The baby is SGA, but stable. I assited mom with latching baby for the first time, in cross cradle hold. She fell asleep as soon as she wa skin to skin with mom. I explained to mom this was normal, and that we would continue to work on breast feeding next week. Mom know to call for questions/concerns.  Patient Name: Rebecca Rhodes [id number] Date: 08/09/2013 Reason for consult: Follow-up assessment;NICU baby;Late preterm infant   Maternal Data Formula Feeding for Exclusion: Yes (baby in the nICU) Infant to breast within first hour of birth: No Breastfeeding delayed due to:: Infant status Has patient been taught Hand Expression?: Yes Does the patient have breastfeeding experience prior to this delivery?: No  Feeding Feeding Type: Breast Milk Length of feed: 0 min  LATCH Score/Interventions Latch: Too sleepy or reluctant, no latch achieved, no sucking elicited. Intervention(s): Skin to skin  Audible Swallowing: None  Type of Nipple: Everted at rest and after stimulation  Comfort (Breast/Nipple): Soft / non-tender     Hold (Positioning): Assistance needed to correctly position infant at breast and maintain latch. Intervention(s): Breastfeeding basics reviewed;Support Pillows;Position options;Skin to skin  LATCH Score: 5  Lactation Tools Discussed/Used Tools: Pump Breast pump type: Double-Electric Breast Pump WIC Program: Yes Pump Review: Setup, frequency, and cleaning;Milk Storage;Other (comment) (hand exp. teaching on NICU EBM) Initiated by:: bedside RN Date initiated:: 08/09/13   Consult Status Consult Status: Follow-up Date: 08/10/13 Follow-up type: In-patient    Alfred Levins 08/09/2013, 6:36 PM

## 2013-08-09 NOTE — Anesthesia Postprocedure Evaluation (Signed)
  Anesthesia Post-op Note  Anesthesia Post Note  Patient: Rebecca Rhodes  Procedure(s) Performed: Procedure(s) (LRB): CESAREAN SECTION (N/A)  Anesthesia type: Epidural  Patient location: PACU  Post pain: Pain level controlled  Post assessment: Post-op Vital signs reviewed  Last Vitals:  Filed Vitals:   08/09/13 0100  BP: 144/85  Pulse: 74  Temp: 36.6 C  Resp: 21    Post vital signs: stable  Level of consciousness: awake  Complications: No apparent anesthesia complications

## 2013-08-09 NOTE — Anesthesia Postprocedure Evaluation (Signed)
  Anesthesia Post-op Note  Patient: Rebecca Rhodes  Procedure(s) Performed: Procedure(s): CESAREAN SECTION (N/A)  Patient Location: [facility] Unit  Anesthesia Type:Epidural  Level of Consciousness: awake  Airway and Oxygen Therapy: Patient Spontanous Breathing  Post-op Pain: none  Post-op Assessment: Patient's Cardiovascular Status Stable, Respiratory Function Stable, RESPIRATORY FUNCTION UNSTABLE, No signs of Nausea or vomiting, Adequate PO intake, Pain level controlled, No headache, No backache, No residual numbness and No residual motor weakness  Post-op Vital Signs: Reviewed and stable  Complications: No apparent anesthesia complications

## 2013-08-09 NOTE — Op Note (Signed)
Rebecca Rhodes PROCEDURE DATE: 08/09/2013  PREOPERATIVE DIAGNOSES: Intrauterine pregnancy at  [redacted]w[redacted]d weeks gestation; failed induction of labor for worsening CHTN and Class B DM,  lagging growth, elevated UA Dopplers with some intermittent absent end-diastolic flow, BPP 4/10; fetal intolerance of labor  POSTOPERATIVE DIAGNOSES: The same  PROCEDURE: Primary Low Transverse Cesarean Section  SURGEON:  Dr. Jaynie Collins  ANESTHESIOLOGIST: Dr. Dana Allan  INDICATIONS: Rebecca Rhodes is a 32 y.o. G1P0 at [redacted]w[redacted]d here for cesarean section secondary to the indications listed under preoperative diagnoses; please see preoperative note for further details.  The risks of cesarean section were discussed with the patient including but were not limited to: bleeding which may require transfusion or reoperation; infection which may require antibiotics; injury to bowel, bladder, ureters or other surrounding organs; injury to the fetus; need for additional procedures including hysterectomy in the event of a life-threatening hemorrhage; placental abnormalities wth subsequent pregnancies, incisional problems, thromboembolic phenomenon and other postoperative/anesthesia complications.   The patient concurred with the proposed plan, giving informed written consent for the procedure.    FINDINGS:  Viable female infant in cephalic presentation; significant caput and molding of the head noted.  Apgars 8 and 9, arterial cord pH was 7.23,  weight 1896 g (4 lbs 2.9 oz).  Clear amniotic fluid s/p amnioinfusion.  Intact placenta, three vessel cord that was noted to be very thin.  Normal uterus, fallopian tubes and ovaries bilaterally.  ANESTHESIA: Epidural INTRAVENOUS FLUIDS: 1500 ml ESTIMATED BLOOD LOSS: 700 ml URINE OUTPUT:  150 ml SPECIMENS: Placenta sent to pathology COMPLICATIONS: None immediate  PROCEDURE IN DETAIL:  The patient preoperatively received intravenous antibiotics and had sequential compression devices applied  to her lower extremities.  She was then taken to the operating room where the epidural anesthesia was dosed up to surgical level and was found to be adequate. She was then placed in a dorsal supine position with a leftward tilt, and prepped and draped in a sterile manner.  A foley catheter was placed into her bladder and attached to constant gravity.  After an adequate timeout was performed, a Pfannenstiel skin incision was made with scalpel and carried through to the underlying layer of fascia. The fascia was incised in the midline, and this incision was extended bilaterally using the Mayo scissors.  Kocher clamps were applied to the superior aspect of the fascial incision and the underlying rectus muscles were dissected off bluntly. A similar process was carried out on the inferior aspect of the fascial incision. The rectus muscles were separated in the midline bluntly and the peritoneum was entered bluntly. Attention was turned to the lower uterine segment where a low transverse hysterotomy was made with a scalpel and extended bilaterally bluntly.  The infant was successfully delivered, the cord was clamped and cut and the infant was handed over to awaiting neonatology team. Uterine massage was then administered, and the placenta delivered intact with a three-vessel cord. The uterus was then cleared of clot and debris.  The hysterotomy was closed with 0 Vicryl in a running locked fashion, and an imbricating layer was also placed with 0 Vicryl. The pelvis was cleared of all clot and debris. Hemostasis was confirmed on all surfaces.  The peritoneum and the muscles were reapproximated using 0 Vicryl interrupted stitches. The fascia was then closed using 0 Vicryl in a running fashion.  The subcutaneous layer was irrigated, and 30 ml of 0.5% Marcaine was injected subcutaneously around the incision. .After the skin was closed with a  4-0 vicryl subcuticular stitch/staples a PICO disposable negative pressure wound  therapy device was placed over the incision.  The suction was activated at a pressure of 80 mmHg.  The adhesive was affixed well and there were no leaks noted. The patient tolerated the procedure well. Sponge, lap, instrument and needle counts were correct x 2.  She was taken to the recovery room in stable condition.

## 2013-08-09 NOTE — Progress Notes (Signed)
Patients small wound vac came out when patient ambulated pt bathroom. pts dressing cdi, notified dr. Marice Potter. No new orders. Ok to leave vac out.

## 2013-08-09 NOTE — Progress Notes (Signed)
Noted patient off IV insulin drip.  However no insulins ordered since drip discontinued? I am glad to assist if needed with recommendations. Thank you, Lenor Coffin, RN, Diabetes CNS [phone])

## 2013-08-09 NOTE — Progress Notes (Signed)
UR completed 

## 2013-08-09 NOTE — Lactation Note (Signed)
This note was copied from the chart of Rebecca Rhodes. Lactation Consultation Note     Initial consult with this mom of a NICU baby, now 104 hours old, and 35 6/[redacted] weeks gestation. Mom has been pumping with DEP, but pumping one breast at a time. I advised mom to pump both at the same time, to increase her her supply. Basic teaching on pumping reviewed with mom. Hnad expression taught - mom return demonstrated with fair technique.  NICI booklet on providing breast milk for a NICU baby, was reviewed with mom.  Mom eager to hold and breast feed her baby. i will assist her with this at 5 pm today. Mom knows to call for questions/concerns. She is aware of the [facility] loaner pump program, and she may loan a DEP on her discharge this weekend or Monday.  Patient Name: Rebecca Rhodes ZOXWR'U Date: 08/09/2013 Reason for consult: Follow-up assessment;NICU baby   Maternal Data Formula Feeding for Exclusion: Yes (baby in the nICU) Infant to breast within first hour of birth: No Breastfeeding delayed due to:: Infant status Has patient been taught Hand Expression?: Yes Does the patient have breastfeeding experience prior to this delivery?: No  Feeding    LATCH Score/Interventions                      Lactation Tools Discussed/Used Tools: Pump Breast pump type: Double-Electric Breast Pump WIC Program: Yes (mom has called WIC - has appointment to get DEP on Tuesday 9) Pump Review: Setup, frequency, and cleaning;Milk Storage;Other (comment) (hand exp. teaching on NICU EBM) Initiated by:: bedside RN Date initiated:: 08/09/13   Consult Status Consult Status: Follow-up Date: 08/10/13 Follow-up type: In-patient    Alfred Levins 08/09/2013, 4:37 PM

## 2013-08-09 NOTE — Progress Notes (Signed)
08/09/13 1600  Clinical Encounter Type  Visited With Patient and family together (FOB Billy)  Visit Type Follow-up;Spiritual support;Social support  Spiritual Encounters  Spiritual Needs Emotional   Rebecca Rhodes and Rebecca Rhodes were in good spirits on this follow-up visit.  Rebecca Rhodes is still adjusting to her baby's delivery and is savoring time with baby in NICU.  Family appreciative of chaplain presence and encouragement.  [address] [place], [place] [phone]

## 2013-08-09 NOTE — Anesthesia Postprocedure Evaluation (Signed)
  Anesthesia Post-op Note  Patient: Rebecca Rhodes  Procedure(s) Performed: Procedure(s): CESAREAN SECTION (N/A)  Patient Location: [facility]  Anesthesia Type:Epidural  Level of Consciousness: awake  Airway and Oxygen Therapy: Patient Spontanous Breathing  Post-op Pain: none  Post-op Assessment: Patient's Cardiovascular Status Stable and Respiratory Function Stable  Post-op Vital Signs: Reviewed and stable  Complications: No apparent anesthesia complications

## 2013-08-09 NOTE — Transfer of Care (Signed)
Immediate Anesthesia Transfer of Care Note  Patient: Rebecca Rhodes  Procedure(s) Performed: Procedure(s): CESAREAN SECTION (N/A)  Patient Location: PACU  Anesthesia Type:Epidural  Level of Consciousness: awake  Airway & Oxygen Therapy: Patient Spontanous Breathing  Post-op Assessment: Report given to PACU RN and Post -op Vital signs reviewed and stable  Post vital signs: stable  Complications: No apparent anesthesia complications

## 2013-08-10 LAB — CBC
HCT: 33.3 % — ABNORMAL LOW (ref 36.0–46.0)
Hemoglobin: 11.1 g/dL — ABNORMAL LOW (ref 12.0–15.0)
MCH: 29.1 pg (ref 26.0–34.0)
MCHC: 33.3 g/dL (ref 30.0–36.0)
MCV: 87.2 fL (ref 78.0–100.0)
RBC: 3.82 MIL/uL — ABNORMAL LOW (ref 3.87–5.11)

## 2013-08-10 LAB — GLUCOSE, CAPILLARY: Glucose-Capillary: 225 mg/dL — ABNORMAL HIGH (ref 70–99)

## 2013-08-10 MED ORDER — METFORMIN HCL 500 MG PO TABS
1000.0000 mg | ORAL_TABLET | Freq: Two times a day (BID) | ORAL | Status: DC
  Administered 2013-08-10 – 2013-08-12 (×5): 1000 mg via ORAL
  Filled 2013-08-10 (×5): qty 2

## 2013-08-10 NOTE — Clinical Social Work Note (Signed)
Clinical Social Work Department PSYCHOSOCIAL ASSESSMENT - MATERNAL/CHILD 08/10/2013  Patient:  Rebecca Rhodes, Rebecca Rhodes  Account Number:  [id number]  Admit Date:  07/15/2013  Marjo Bicker Name:   Tim Lair    Clinical Social Worker:  Truman Hayward, LCSW   Date/Time:  08/10/2013 10:30 AM  Date Referred:  08/10/2013   Referral source  Physician  RN     Referred reason  NICU   Other referral source:    I:  FAMILY / HOME ENVIRONMENT Child's legal guardian:  PARENT  Guardian - Name Guardian - Age Guardian - Address  Jawanda Passey [address] [place], [place] [address]  [name]  [address] [place], [place] [address]   Other household support members/support persons Name Relationship DOB  none     Other support:   MOB and FOB report good family support in community    II  PSYCHOSOCIAL DATA Information Source:  Patient Interview  Event organiser Employment:   Financial resources:  Medicaid If Medicaid - County:  [place] Other  WIC   School / Grade:   Maternity Care Coordinator / Child Services Coordination / Early Interventions:  Cultural issues impacting care:    III  STRENGTHS Strengths  Understanding of illness  Compliance with medical plan  Supportive family/friends  Adequate Resources   Strength comment:    IV  RISK FACTORS AND CURRENT PROBLEMS Current Problem:  None   Risk Factor & Current Problem Patient Issue Family Issue Risk Factor / Current Problem Comment   N N     V  SOCIAL WORK ASSESSMENT CSW met with MOB and FOB in room. CSW discussed infant admission to NICU. MOB and FOB reports appropriate emotions around admission and feels she has good communication with doctors and nurses. CSW discussed with MOB any emotional concerns. MOB reports no significant emotional concerns at this time. CSW discussed PPD. MOB reports she is unsure of what symptoms to look out for. CSW provided MOB with feelings after birth brochure.  CSW discussed any MH  hx with MOB and FOB.  MOB reports no MH hx with emotion concerns or comprehension.  FOB does not report any hx as well.  CSW discussed supplies and family support. MOB reports good family support. MOB reports family is contuing to help with supplies so she is unsure of their needs at this time.  CSW discussed with MOB and FOB to let RN or CSW know if concerns arise and resources can be looked into. CSW spoke with RN about report from previous nurse that MOB and FOB have "special needs". Current  RN did not report any significant concerns.  CSW did not note any comprehension concerns when consulting.  CSW asked about infant treatment and both MOB and FOB could answer appropriately and were able to give specifics.  CSW will continue to monitor and discuss with RN as care continues. No other social concerns at this time.  Both MOB and FOB were instructed to let RN or CSW know if any further concerns arise. CSW continues to follow while infant in NICU and to monitor behavior.      VI SOCIAL WORK PLAN Social Work Plan  Psychosocial Support/Ongoing Assessment of Needs   Type of pt/family education:   If child protective services report - county:   If child protective services report - date:   Information/referral to community resources comment:   Other social work plan:

## 2013-08-10 NOTE — Lactation Note (Signed)
This note was copied from the chart of Rebecca Rhodes. Lactation Consultation Note: Follow up visit with mom. She reports that she pumped 3 times yesterday. Encouraged to try to pump 8 times/ 24 hours. Reports that she is having trouble with hand expression. Assisted mom and she was able to obtain a drop of Colostrum with the first compression. Praise given. Mom asking about pumping- how much suction to use. Reports that ir is hurting- encourage to turn suction down a little. Reviewed how to get premie setting on pump. No further questions at present. Plans to get pump from [facility] on Tuesday.  Patient Name: Rebecca Triana Coover [id number] Date: 08/10/2013 Reason for consult: Follow-up assessment   Maternal Data    Feeding   LATCH Score/Interventions                      Lactation Tools Discussed/Used     Consult Status Consult Status: Follow-up Date: 08/11/13 Follow-up type: In-patient    Pamelia Hoit 08/10/2013, 10:18 AM

## 2013-08-10 NOTE — H&P (Signed)
Attestation of Attending Supervision of Advanced Practitioner (CNM/NP): Evaluation and management procedures were performed by the Advanced Practitioner under my supervision and collaboration.  I have reviewed the Advanced Practitioner's note and chart, and I agree with the management and plan.  Hamsini Verrilli 08/10/2013 1:20 PM   

## 2013-08-10 NOTE — Progress Notes (Signed)
Subjective: Postpartum Day 1: Cesarean Delivery Patient reports incisional pain and tolerating PO. She denies CP, SOB, lightheadedness/dizziness  Objective: Vital signs in last 24 hours: Temp:  [97.5 F (36.4 C)-98.2 F (36.8 C)] 97.9 F (36.6 C) (08/30 1546) Pulse Rate:  [75-89] 80 (08/30 1546) Resp:  [16-18] 17 (08/30 1546) BP: (110-150)/(70-103) 147/92 mmHg (08/30 1546) SpO2:  [100 %] 100 % (08/30 1546)  Physical Exam:  General: alert, cooperative and no distress Lochia: appropriate Uterine Fundus: firm, nt Incision: clean, dry and intact DVT Evaluation: No cords or calf tenderness.    Recent Labs  08/09/13 0106 08/10/13 0537  HGB 12.7 11.1*  HCT 37.4 33.3*    Assessment/Plan: Status post Cesarean section. Doing well postoperatively.  Continue current care.  Annisten Manchester 08/10/2013, 4:22 PM

## 2013-08-11 LAB — GLUCOSE, CAPILLARY
Glucose-Capillary: 168 mg/dL — ABNORMAL HIGH (ref 70–99)
Glucose-Capillary: 172 mg/dL — ABNORMAL HIGH (ref 70–99)
Glucose-Capillary: 164 mg/dL — ABNORMAL HIGH (ref 70–99)

## 2013-08-11 NOTE — Progress Notes (Signed)
Subjective: Postpartum Day 2: Cesarean Delivery Patient reports tolerating PO, + flatus and no problems voiding.    Objective: Vital signs in last 24 hours: Temp:  [97.5 F (36.4 C)-98.7 F (37.1 C)] 98.7 F (37.1 C) (08/31 0549) Pulse Rate:  [75-89] 77 (08/31 0549) Resp:  [16-18] 18 (08/31 0549) BP: (130-150)/(75-103) 133/75 mmHg (08/31 0549) SpO2:  [100 %] 100 % (08/30 1839)  Physical Exam:  General: alert, cooperative and no distress Lochia: appropriate Uterine Fundus: firm, NT Incision: clean, dry and intact DVT Evaluation: Negative Homan's sign. No cords or calf tenderness. No significant calf/ankle edema.   Recent Labs  08/09/13 0106 08/10/13 0537  HGB 12.7 11.1*  HCT 37.4 33.3*    Assessment/Plan: Status post Cesarean section. Doing well postoperatively.  Continue current care. Patient desires to be discharged tomorrow.  Press Casale 08/11/2013, 7:22 AM

## 2013-08-12 ENCOUNTER — Encounter (HOSPITAL_COMMUNITY): Payer: Self-pay | Admitting: Obstetrics & Gynecology

## 2013-08-12 LAB — GLUCOSE, CAPILLARY: Glucose-Capillary: 161 mg/dL — ABNORMAL HIGH (ref 70–99)

## 2013-08-12 MED ORDER — IBUPROFEN 800 MG PO TABS: 800.0000 mg | ORAL_TABLET | Freq: Three times a day (TID) | ORAL | Status: DC | PRN

## 2013-08-12 MED ORDER — OXYCODONE-ACETAMINOPHEN 5-325 MG PO TABS: 1.0000 | ORAL_TABLET | ORAL | Status: DC | PRN

## 2013-08-12 MED ORDER — HYDROCHLOROTHIAZIDE 25 MG PO TABS: 25.0000 mg | ORAL_TABLET | Freq: Every day | ORAL | Status: DC

## 2013-08-12 MED ORDER — FLEET ENEMA 7-19 GM/118ML RE ENEM
1.0000 | ENEMA | Freq: Once | RECTAL | Status: AC
  Administered 2013-08-12: 1 via RECTAL

## 2013-08-12 NOTE — Progress Notes (Signed)
Fasting blood sugar this am at 0600 was 161

## 2013-08-12 NOTE — Discharge Summary (Signed)
Physician Discharge Summary  Patient ID: Rebecca Rhodes MRN: [id number] DOB/AGE: 33-06-1981 33 y.o.  Admit date: 07/15/2013 Discharge date: 08/12/2013  Admission Diagnoses: 36.[redacted] weeks EGA, Class B DM, CHTN, obesity, non-reassuring fetal testing  Discharge Diagnoses: same + failed IOL  Active Problems:   * No active hospital problems. *   Discharged Condition: good  Hospital Course: Her baby did not tolerate labor and she underwent an uncomplicated PLTCS. Post operatively she did well and she voiced her readiness to go home on POD#3. She was ambulating, voiding, and tolerating po well. She is experiencing significant constipation and will receive an enema prior to discharge home. Her sugars were 120-160s on metformin 1000 mg BID. Her BPs were stable as well  Consults: None  Significant Diagnostic Studies: labs: post op hbg 11.4  Treatments: surgery: as above  Discharge Exam: Blood pressure 130/75, pulse 91, temperature 97.9 F (36.6 C), temperature source Oral, resp. rate 18, height 5\' 4"  (1.626 m), weight 108.138 kg (238 lb 6.4 oz), last menstrual period 12/01/2012, SpO2 96.00%, unknown if currently breastfeeding. General appearance: cooperative Resp: clear to auscultation bilaterally Cardio: regular rate and rhythm, S1, S2 normal, no murmur, click, rub or gallop GI: soft, non-tender; bowel sounds normal; no masses,  no organomegaly Incision/Wound: honeycomb dressing clean, dry, and intact  Disposition: 01-Home or Self Care     Medication List    STOP taking these medications       HUMULIN N 100 UNIT/ML injection  Generic drug:  insulin NPH     insulin NPH 100 UNIT/ML injection  Commonly known as:  HUMULIN N,NOVOLIN N     insulin regular 100 units/mL injection  Commonly known as:  NOVOLIN R,HUMULIN R     Insulin Syringes (Disposable) U-100 1 ML Misc     labetalol 300 MG tablet  Commonly known as:  NORMODYNE      TAKE these medications       ACCU-CHEK FASTCLIX  LANCETS Misc  1 Units by Percutaneous route 4 (four) times daily.     glucose blood test strip  Commonly known as:  ACCU-CHEK SMARTVIEW  Use as instructed to check blood sugars     hydrochlorothiazide 25 MG tablet  Commonly known as:  HYDRODIURIL  Take 1 tablet (25 mg total) by mouth daily.     ibuprofen 800 MG tablet  Commonly known as:  ADVIL,MOTRIN  Take 1 tablet (800 mg total) by mouth every 8 (eight) hours as needed for pain.     oxyCODONE-acetaminophen 5-325 MG per tablet  Commonly known as:  PERCOCET/ROXICET  Take 1 tablet by mouth every 4 (four) hours as needed for pain.           Follow-up Information   Follow up with [facility] Schedule an appointment as soon as possible for a visit in 3 days. (BP check)    Contact information:   [address] [place] [address] [phone]      Signed: Allie Bossier. 08/12/2013, 7:20 AM

## 2013-08-13 ENCOUNTER — Encounter: Payer: Self-pay | Admitting: *Deleted

## 2013-08-15 ENCOUNTER — Ambulatory Visit (INDEPENDENT_AMBULATORY_CARE_PROVIDER_SITE_OTHER): Payer: Medicaid Other | Admitting: General Practice

## 2013-08-15 ENCOUNTER — Ambulatory Visit: Payer: Self-pay

## 2013-08-15 VITALS — BP 160/126 | HR 90 | Temp 97.5°F | Ht 64.0 in | Wt 229.6 lb

## 2013-08-15 DIAGNOSIS — Z0130 Encounter for examination of blood pressure without abnormal findings: Secondary | ICD-10-CM

## 2013-08-15 DIAGNOSIS — Z136 Encounter for screening for cardiovascular disorders: Secondary | ICD-10-CM

## 2013-08-15 NOTE — Progress Notes (Signed)
Spoke with dr Erin Fulling regarding patient's blood pressure, advised to start taking HCTZ as prescribed (patient never picked up prescription after discharge home) and follow up in one week for repeat blood pressure check. Also told patient to report to ER if she experiences sudden headches, chest pain, or numbness. Patient verbalized understanding to all instructions.

## 2013-08-15 NOTE — Lactation Note (Addendum)
This note was copied from the chart of Rebecca Myiah Petkus. Lactation Consultation Note   Follow up consult with this mom of a NICU baby, now  22 days old, and ready to room in tomorrow night. Mom has not been pumping very much - about 3 times a day, and is seeing a decrease in her supply. I reviewed with mom the importance of pumping at least 8 times a day. Mom also was just pumping for 15 minutes - I told mom to pump up to 30, or until she stops dripping. I encouraged mom to come back for an outpatient consult, to work on latching baby.   I wrote down directions for mom on the above.  Patient Name: Rebecca Rhodes [id number] Date: 08/15/2013 Reason for consult: Follow-up assessment;NICU baby   Maternal Data    Feeding Feeding Type: Breast Milk Nipple Type: Slow - flow Length of feed: 30 min  LATCH Score/Interventions                      Lactation Tools Discussed/Used     Consult Status Consult Status: Follow-up Date: 08/16/13 Follow-up type: In-patient    Alfred Levins 08/15/2013, 12:19 PM

## 2013-08-17 ENCOUNTER — Ambulatory Visit: Payer: Self-pay

## 2013-08-17 NOTE — Lactation Note (Signed)
This note was copied from the chart of Rebecca Milan Clare. Lactation Consultation Note    Follow up consult with this mom and baby in NICU, now 9 days old, [redacted] weeks gestation, and weighs 4-8. Mom roomed in with baby last night. I assisted her with latching her baby today. The baby is small, and could not maintain latch, so I tried a 16 nipple shield, and instructed her in it;s care. I told mom I would see her before her discharge tomorrow, and review nipple shiled, and make an out patient consult for her,  Patient Name: Rebecca Rhodes [id number] Date: 08/17/2013 Reason for consult: Follow-up assessment;NICU baby   Maternal Data    Feeding Feeding Type: Breast Milk Nipple Type: Slow - flow  LATCH Score/Interventions Latch: Repeated attempts needed to sustain latch, nipple held in mouth throughout feeding, stimulation needed to elicit sucking reflex. (16 nipple shiled kept baby latached and swalloing) Intervention(s): Skin to skin;Teach feeding cues;Waking techniques Intervention(s): Adjust position;Assist with latch;Breast compression  Audible Swallowing: A few with stimulation  Type of Nipple: Everted at rest and after stimulation  Comfort (Breast/Nipple): Soft / non-tender     Hold (Positioning): Assistance needed to correctly position infant at breast and maintain latch. Intervention(s): Breastfeeding basics reviewed;Support Pillows;Position options;Skin to skin  LATCH Score: 7  Lactation Tools Discussed/Used Tools: Nipple Shields Nipple shield size: 16   Consult Status Consult Status: Follow-up Date: 08/18/13 Follow-up type: In-patient    Alfred Levins 08/17/2013, 2:22 PM

## 2013-08-21 ENCOUNTER — Ambulatory Visit: Payer: Self-pay

## 2013-08-21 NOTE — Lactation Note (Signed)
This note was copied from the chart of Destiny Pittman. Infant Lactation Consultation Outpatient Visit Note  Patient Name: Tim Lair Date of Birth: 08/09/2014 Birth Weight:  4 lb 2.9 oz (1896 g)     9/6 weight was 4-8 todays weight 4-13.9 Gestational Age at Delivery: Gestational Age: [redacted]w[redacted]d Type of Delivery:   Breastfeeding History Frequency of Breastfeeding:   once a day at night Length of Feeding:  Stays latched for one minute, and pulls away  Voids: wnl Stools: wnl  Supplementing / Method: Pumping:  Type of Pump:WIC DEP   Frequency:8 times a day  Volume:  4 ounces   Comments:  Mom is mostly pumping and bottle feeding, She is adding powdered milk to EBM to increase calories . MGM brought  Ready made formula, mom not sure what calorie content, and she has been adding an ounce of this formula to 3 ounces of EBM. I told mom to not use the ready made formula for now, but to add the powder to add to her EBM, as she was instructed on discharge from the NICU.  I reviewed those mixing instructions with mom, and explained that using the powder give her milk more calories. Mom has not been putting date and time on her EBM, because she ran out of the hospital labels. I gave her a pack of labels (plain yellow dots), for her to use to label.    Consultation Evaluation:   Follow up out patient lactation appointment with this mom of a now 37 4/[redacted] week gestation, very small, weighing 4- 13 .9 ( with diaper), today. She is gaining weight well at home. I reviewed with mom how to add Neosure powder to her EBM, not ready made formula. I assisted mom with breast feeding the baby today. She transferred only 2 mls without nipple shiled, and an additional 22 mls with the shield. Mom had trouble applying the 16 shield, so I had her try the 20 shield. She did better with this, and the baby fills the shield with milk easily. I told mom to breast feed with shield once or twice a day, to keep pumping every 3 hours,  and to offer fortified EBM after breast feeding. Mom will come back for a follow up appointment.   Initial Feeding Assessment: Pre-feed Weight:2208 Post-feed Weight:2210 Amount Transferred:2 Comments:  I applied 16 nipple shield with next feeding attempt. Mom knows how to apply shield, but is having trouble with this.  Additional Feeding Assessment: Pre-feed Weight:2210 Post-feed [id number]:0454 Amount Transferred:12 Comments:  Additional Feeding Assessment: Pre-feed [id number]:1478 Post-feed [id number]:3086 Amount Transferred:12 Comments:used 20 nipple shield with this feed - mom applied independedntly  Total Breast milk Transferred this Visit: 24 Total Supplement Given: 50 mls of EBM offerred by bottle with slow flow nipple  Additional Interventions:     I had mom pump with a manual hand pump, and supplemented the baby with her EBM   Follow-Up  Mom has an appointment for 9/24 14 at 2;30 pm, for a follow out patient  Lactation  consult      Alfred Levins 08/21/2013, 2:59 PM

## 2013-08-26 ENCOUNTER — Ambulatory Visit (INDEPENDENT_AMBULATORY_CARE_PROVIDER_SITE_OTHER): Payer: Medicaid Other

## 2013-08-26 VITALS — BP 160/110 | HR 88

## 2013-08-26 DIAGNOSIS — IMO0001 Reserved for inherently not codable concepts without codable children: Secondary | ICD-10-CM

## 2013-08-26 DIAGNOSIS — O10019 Pre-existing essential hypertension complicating pregnancy, unspecified trimester: Secondary | ICD-10-CM

## 2013-08-26 MED ORDER — AMLODIPINE BESYLATE 10 MG PO TABS: 10.0000 mg | ORAL_TABLET | Freq: Every day | ORAL | Status: DC

## 2013-08-26 NOTE — Progress Notes (Signed)
Pt came in for BP check.  Pt informed me that she had taken the HCTZ today at 1400 but has forgotten to take the medication x 2 days.  Per Dr.Arnold add Norvasc 10 mg po daily with the HCTZ 25 mg po daily and come back for a BP check in 3 days.  I advised pt on how to take medication and emphasized on the importance on making sure that her BP becomes stable.  Pt stated understanding and stated that she would be here in 3 days and will take her medication as prescribed.

## 2013-08-29 ENCOUNTER — Ambulatory Visit (INDEPENDENT_AMBULATORY_CARE_PROVIDER_SITE_OTHER): Payer: Medicaid Other | Admitting: *Deleted

## 2013-08-29 VITALS — BP 152/110 | HR 95 | Wt 216.4 lb

## 2013-08-29 DIAGNOSIS — I10 Essential (primary) hypertension: Secondary | ICD-10-CM

## 2013-08-29 NOTE — Progress Notes (Signed)
Rebecca Rhodes is here for BP check, has CHTN, delivered 08[birth year]09/13. Had BP check 08/26/13 bp was 160/110.States she has been taking hctz daily and started norvasc 08/27/13 . States she took both meds today about 9am. Discussed patients meds and bp's with Dr. Macon Large. Instructed patient to take hctz 50 mg daily and continue norvasc and come back for bp check in one week. We discussed dangers of elevated bp and importance of taking meds as ordered and getting bp rechecked.  patietn states she has enough 25 mg tablets to equal 50 mg until bp check.  Discussed will send in new rx at bp check next week if needed.

## 2013-08-29 NOTE — Patient Instructions (Signed)
Increase hydrochlorothiazide to 50 mg a day- may take 2 of the 25 mg tablets you already have to equal 50 mg.

## 2013-08-30 ENCOUNTER — Emergency Department (HOSPITAL_COMMUNITY)
Admission: EM | Admit: 2013-08-30 | Discharge: 2013-08-30 | Disposition: A | Discharge status: Home / Self Care | Payer: Medicaid Other | Attending: Emergency Medicine | Admitting: Emergency Medicine

## 2013-08-30 ENCOUNTER — Encounter (HOSPITAL_COMMUNITY): Payer: Self-pay | Admitting: Emergency Medicine

## 2013-08-30 DIAGNOSIS — R739 Hyperglycemia, unspecified: Secondary | ICD-10-CM

## 2013-08-30 DIAGNOSIS — E1110 Type 2 diabetes mellitus with ketoacidosis without coma: Secondary | ICD-10-CM | POA: Insufficient documentation

## 2013-08-30 DIAGNOSIS — Z79899 Other long term (current) drug therapy: Secondary | ICD-10-CM | POA: Insufficient documentation

## 2013-08-30 DIAGNOSIS — Z9889 Other specified postprocedural states: Secondary | ICD-10-CM | POA: Insufficient documentation

## 2013-08-30 DIAGNOSIS — I10 Essential (primary) hypertension: Secondary | ICD-10-CM | POA: Insufficient documentation

## 2013-08-30 DIAGNOSIS — Z880 Allergy status to penicillin: Secondary | ICD-10-CM | POA: Insufficient documentation

## 2013-08-30 DIAGNOSIS — Z8709 Personal history of other diseases of the respiratory system: Secondary | ICD-10-CM | POA: Insufficient documentation

## 2013-08-30 DIAGNOSIS — Z8619 Personal history of other infectious and parasitic diseases: Secondary | ICD-10-CM | POA: Insufficient documentation

## 2013-08-30 DIAGNOSIS — E119 Type 2 diabetes mellitus without complications: Secondary | ICD-10-CM

## 2013-08-30 DIAGNOSIS — Z3202 Encounter for pregnancy test, result negative: Secondary | ICD-10-CM | POA: Insufficient documentation

## 2013-08-30 LAB — CBC WITH DIFFERENTIAL/PLATELET
Basophils Absolute: 0 10*3/uL (ref 0.0–0.1)
Basophils Relative: 1 % (ref 0–1)
Eosinophils Absolute: 0.3 10*3/uL (ref 0.0–0.7)
Eosinophils Relative: 5 % (ref 0–5)
Lymphs Abs: 2.2 10*3/uL (ref 0.7–4.0)
MCH: 29.9 pg (ref 26.0–34.0)
Neutrophils Relative %: 49 % (ref 43–77)
Platelets: 310 10*3/uL (ref 150–400)
RBC: 4.79 MIL/uL (ref 3.87–5.11)
RDW: 13.7 % (ref 11.5–15.5)
WBC: 5.6 10*3/uL (ref 4.0–10.5)

## 2013-08-30 LAB — COMPREHENSIVE METABOLIC PANEL
ALT: 7 U/L (ref 0–35)
AST: 16 U/L (ref 0–37)
Albumin: 3.6 g/dL (ref 3.5–5.2)
Alkaline Phosphatase: 90 U/L (ref 39–117)
Calcium: 9.7 mg/dL (ref 8.4–10.5)
Glucose, Bld: 543 mg/dL — ABNORMAL HIGH (ref 70–99)
Potassium: 4 mEq/L (ref 3.5–5.1)
Sodium: 131 mEq/L — ABNORMAL LOW (ref 135–145)
Total Protein: 8 g/dL (ref 6.0–8.3)

## 2013-08-30 LAB — URINALYSIS, ROUTINE W REFLEX MICROSCOPIC
Bilirubin Urine: NEGATIVE
Glucose, UA: >1000 mg/dL — AB
Ketones, ur: NEGATIVE mg/dL
Nitrite: NEGATIVE
Protein, ur: NEGATIVE mg/dL
Specific Gravity, Urine: 1.034 — ABNORMAL HIGH (ref 1.005–1.030)
Urobilinogen, UA: 0.2 mg/dL (ref 0.0–1.0)
pH: 6 (ref 5.0–8.0)

## 2013-08-30 LAB — GLUCOSE, CAPILLARY: Glucose-Capillary: 529 mg/dL — ABNORMAL HIGH (ref 70–99)

## 2013-08-30 LAB — URINE MICROSCOPIC-ADD ON

## 2013-08-30 LAB — POCT PREGNANCY, URINE: Preg Test, Ur: NEGATIVE

## 2013-08-30 LAB — KETONES, QUALITATIVE: Acetone, Bld: NEGATIVE

## 2013-08-30 MED ORDER — SODIUM CHLORIDE 0.9 % IV BOLUS (SEPSIS): 1000.0000 mL | Freq: Once | INTRAVENOUS | Status: DC

## 2013-08-30 MED ORDER — SODIUM CHLORIDE 0.9 % IV SOLN: INTRAVENOUS | Status: DC

## 2013-08-30 MED ORDER — INSULIN ASPART 100 UNIT/ML ~~LOC~~ SOLN
10.0000 [IU] | Freq: Once | SUBCUTANEOUS | Status: AC
  Administered 2013-08-30: 10 [IU] via SUBCUTANEOUS
  Filled 2013-08-30: qty 1

## 2013-08-30 MED ORDER — SODIUM CHLORIDE 0.9 % IV BOLUS (SEPSIS)
2000.0000 mL | Freq: Once | INTRAVENOUS | Status: AC
  Administered 2013-08-30: 1000 mL via INTRAVENOUS

## 2013-08-30 NOTE — ED Provider Notes (Signed)
CSN: [id number]     Arrival date & time 08/30/13  1622 History   First MD Initiated Contact with Patient 08/30/13 1631     Chief Complaint  Patient presents with  . Hyperglycemia   (Consider location/radiation/quality/duration/timing/severity/associated sxs/prior Treatment) HPI This 32 year old female has diabetes and hypertension and had a C-Section 3 weeks ago, told to stop insulin by OB 2 weeks ago, started checking glucose yesterday was 400's and today 500's so told to go to ED. she is no fever no confusion no headache no chest pain no cough no shortness breath no abdominal pain no vomiting no dysuria no polyuria no polydipsia no lightheadedness or other concerns she has normal oral intake and was breast-feeding her baby but is no longer doing so. She feels asymptomatic despite her elevated blood sugars. Her C-section incision has healed well. There is no treatment prior to arrival. Past Medical History  Diagnosis Date  . HTN (hypertension) 06/04/2012  . Hyperlipidemia 06/04/2012  . Diabetes mellitus 06/04/2012  . HSV-2 infection   . DKA (diabetic ketoacidoses)   . Acute respiratory failure    Past Surgical History  Procedure Laterality Date  . Irrigation and debridement abscess  10/31/2012    Procedure: IRRIGATION AND DEBRIDEMENT ABSCESS;  Surgeon: Cherylynn Ridges, MD;  Location: [facility] OR;  Service: General;  Laterality: Bilateral;  . Cesarean section N/A 08/08/2013    Procedure: CESAREAN SECTION;  Surgeon: Tereso Newcomer, MD;  Location: [facility];  Service: Obstetrics;  Laterality: N/A;   Family History  Problem Relation Age of Onset  . Hypertension Mother   . Stroke Mother   . Heart disease Father   . Cancer Maternal Grandfather    History  Substance Use Topics  . Smoking status: Never Smoker   . Smokeless tobacco: Never Used  . Alcohol Use: No   OB History   Grav Para Term Preterm Abortions TAB SAB Ect Mult Living   1 1  1      1      Review of Systems 10 Systems reviewed  and are negative for acute change except as noted in the HPI. Allergies  Penicillins  Home Medications   Current Outpatient Rx  Name  Route  Sig  Dispense  Refill  . amLODipine (NORVASC) 10 MG tablet   Oral   Take 1 tablet (10 mg total) by mouth daily.   30 tablet   1   . hydrochlorothiazide (HYDRODIURIL) 25 MG tablet   Oral   Take 1 tablet (25 mg total) by mouth daily.   30 tablet   3   . ibuprofen (ADVIL,MOTRIN) 800 MG tablet   Oral   Take 1 tablet (800 mg total) by mouth every 8 (eight) hours as needed for pain.   60 tablet   2   . oxyCODONE-acetaminophen (PERCOCET/ROXICET) 5-325 MG per tablet   Oral   Take 1 tablet by mouth every 4 (four) hours as needed for pain.   30 tablet   0    BP 169/116  Pulse 87  Temp(Src) 98.9 F (37.2 C) (Oral)  Resp 14  SpO2 100% Physical Exam  Nursing note and vitals reviewed. Constitutional:  Awake, alert, nontoxic appearance.  HENT:  Head: Atraumatic.  Eyes: Right eye exhibits no discharge. Left eye exhibits no discharge.  Neck: Neck supple.  Cardiovascular: Normal rate and regular rhythm.   No murmur heard. Pulmonary/Chest: Effort normal and breath sounds normal. No respiratory distress. She has no wheezes. She has no rales. She  exhibits no tenderness.  Abdominal: Soft. Bowel sounds are normal. She exhibits no distension and no mass. There is no tenderness. There is no rebound and no guarding.  Well-healing transverse abdominal incision from her C-section with no erythema no dehiscence no purulent drainage no tenderness  Musculoskeletal: She exhibits no tenderness.  Baseline ROM, no obvious new focal weakness.  Neurological: She is alert.  Mental status and motor strength appears baseline for patient and situation.  Skin: No rash noted.  Psychiatric: She has a normal mood and affect.    ED Course  Procedures (including critical care time) Pt stable in ED with no significant deterioration in condition.Patient informed of  clinical course, understand medical decision-making process, and agree with plan. Labs Review Labs Reviewed  COMPREHENSIVE METABOLIC PANEL - Abnormal; Notable for the following:    Sodium 131 (*)    Chloride 94 (*)    Glucose, Bld 543 (*)    GFR calc non Af Amer 77 (*)    GFR calc Af Amer 89 (*)    All other components within normal limits  URINALYSIS, ROUTINE W REFLEX MICROSCOPIC - Abnormal; Notable for the following:    Specific Gravity, Urine 1.034 (*)    Glucose, UA >1000 (*)    Hgb urine dipstick LARGE (*)    Leukocytes, UA SMALL (*)    All other components within normal limits  GLUCOSE, CAPILLARY - Abnormal; Notable for the following:    Glucose-Capillary 529 (*)    All other components within normal limits  URINE MICROSCOPIC-ADD ON - Abnormal; Notable for the following:    Bacteria, UA FEW (*)    All other components within normal limits  GLUCOSE, CAPILLARY - Abnormal; Notable for the following:    Glucose-Capillary 345 (*)    All other components within normal limits  CBC WITH DIFFERENTIAL  KETONES, QUALITATIVE  POCT PREGNANCY, URINE   Imaging Review No results found.  MDM   1. Hyperglycemia   2. Diabetes mellitus    I doubt any other EMC precluding discharge at this time including, but not necessarily limited to the following:DKA.    Hurman Horn, MD 08/31/13 [phone]

## 2013-08-30 NOTE — ED Notes (Signed)
Pt c/o hyperglycemia..  Was told by in home nurse that CBG was 527.

## 2013-09-05 ENCOUNTER — Ambulatory Visit: Payer: Medicaid Other | Admitting: Obstetrics and Gynecology

## 2013-09-05 VITALS — BP 146/106 | HR 98 | Ht 64.0 in | Wt 216.0 lb

## 2013-09-05 DIAGNOSIS — I10 Essential (primary) hypertension: Secondary | ICD-10-CM

## 2013-09-05 MED ORDER — LISINOPRIL 5 MG PO TABS: 5.0000 mg | ORAL_TABLET | Freq: Every day | ORAL | Status: DC

## 2013-09-05 NOTE — Progress Notes (Signed)
Reviewed by Dr. Rulon Abide. Added Lisinopril 5mg  one tablet by mouth #30 no refills. Patient to come back in 2 weeks for BP check again. Patient states understanding.

## 2013-09-10 IMAGING — US US FETAL BPP W/O NONSTRESS
1 series · 13 of 21 positions shown · non-contrast
Comparison: none

[Series 1: us fetal bpp w/o nonstress · 0.20mm/px · 21 acquisitions, 13 frames shown]
[im 1/21]
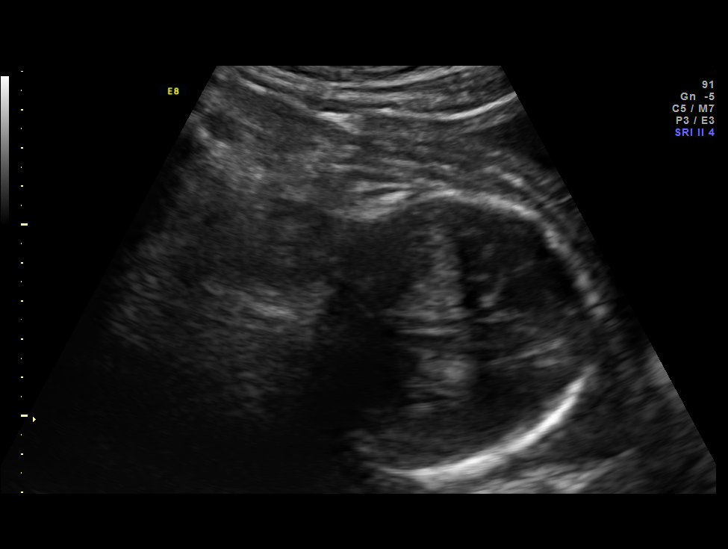
[im 3/21]
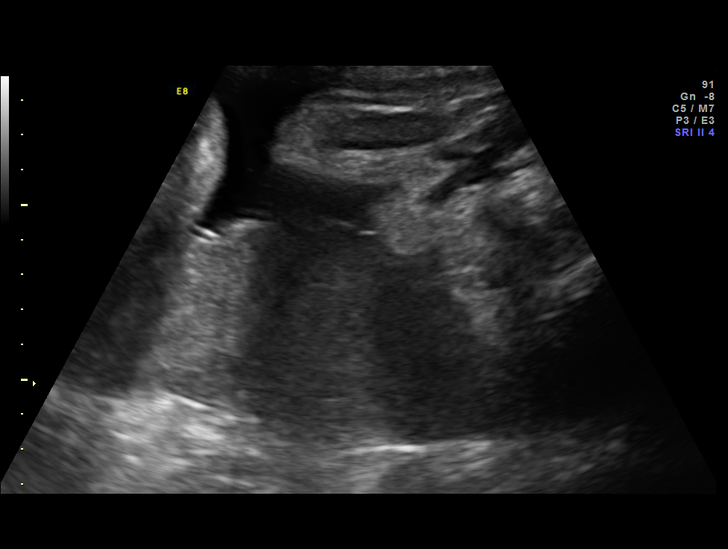
[im 5/21]
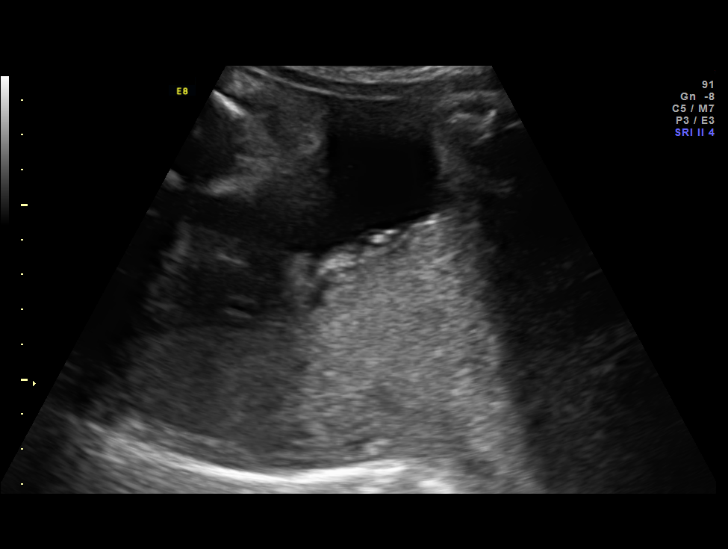
[im 6/21]
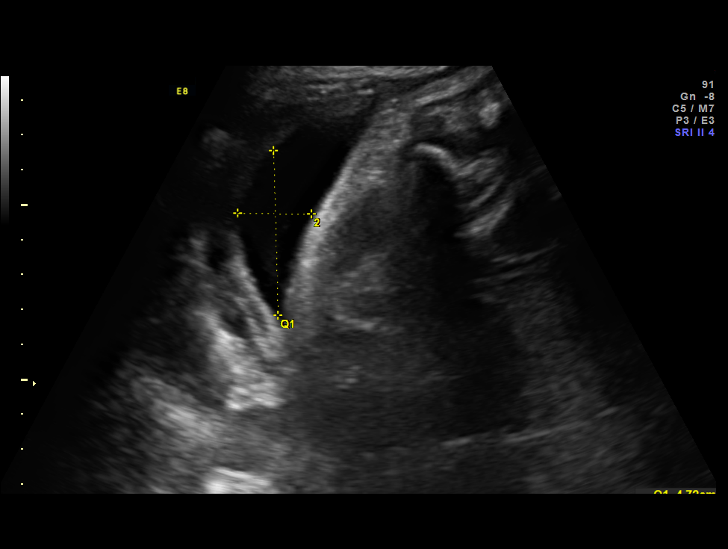
[im 8/21]
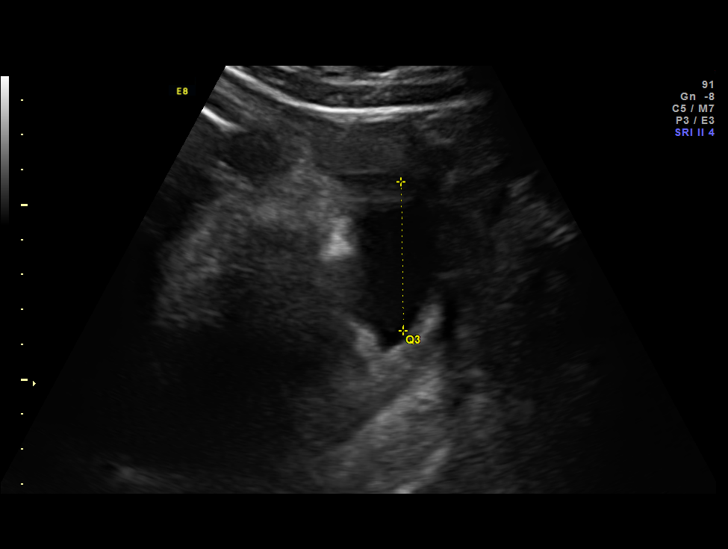
[im 9/21]
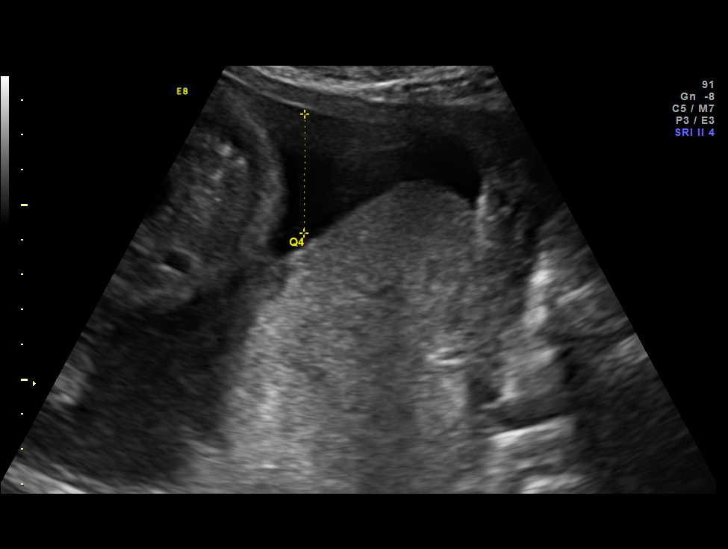
[im 11/21]
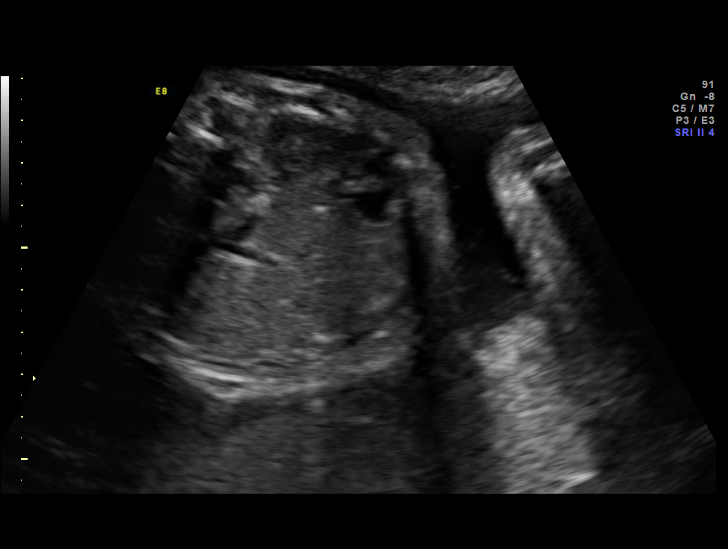
[im 13/21]
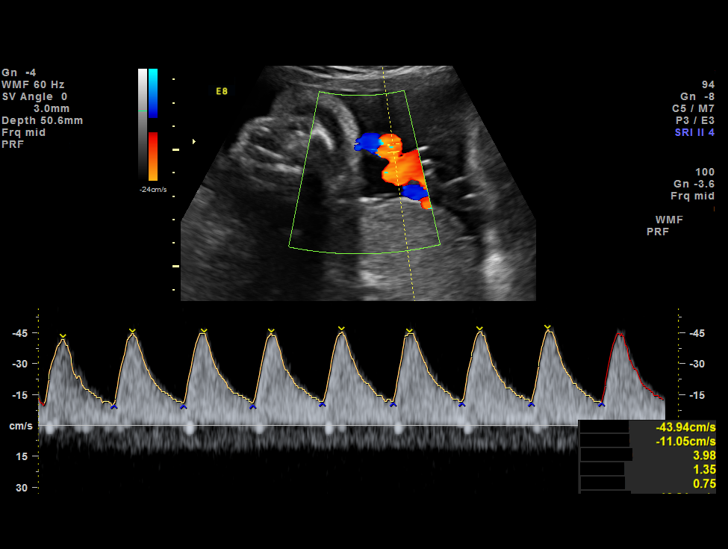
[im 14/21]
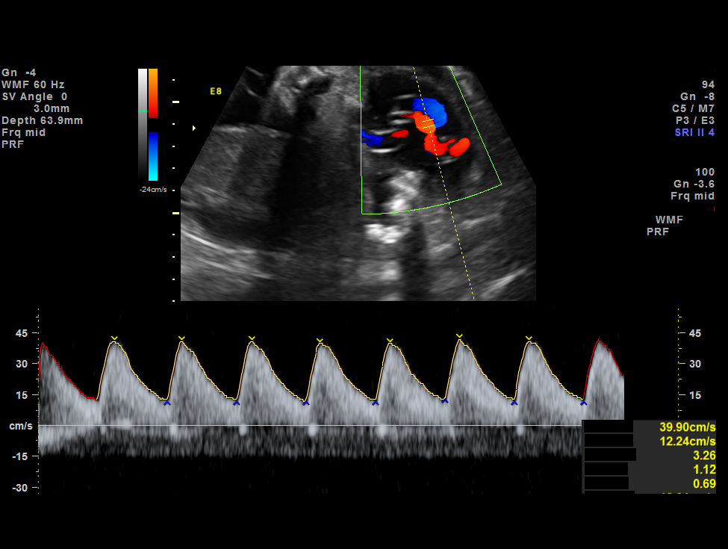
[im 16/21]
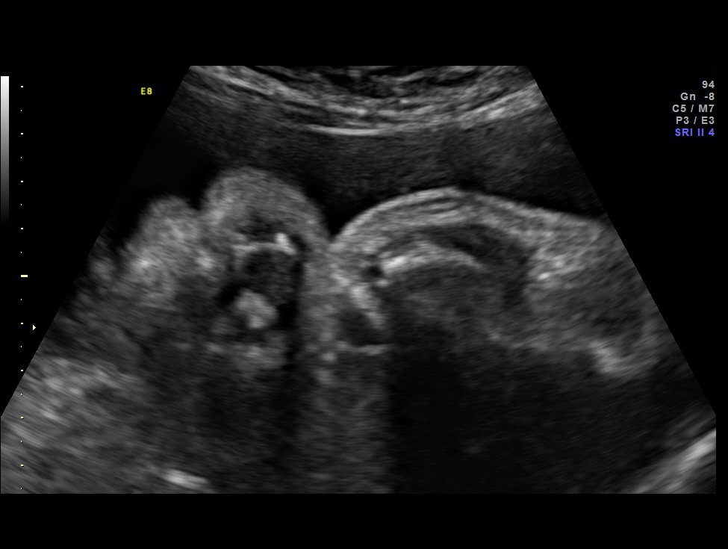
[im 17/21]
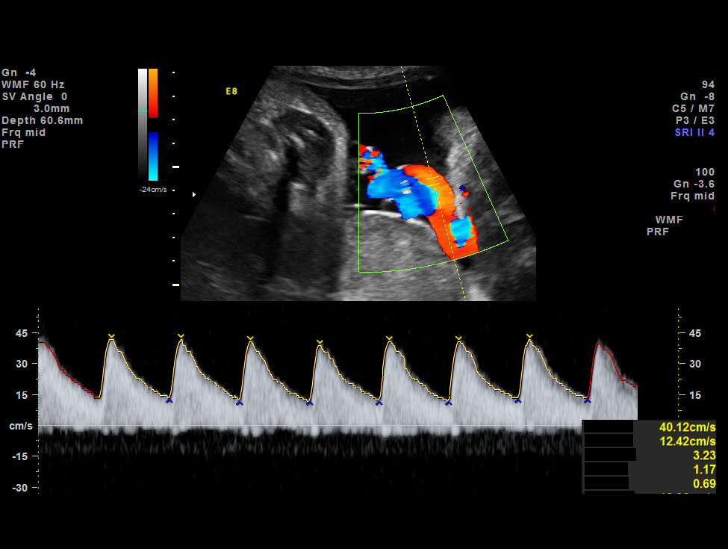
[im 19/21]
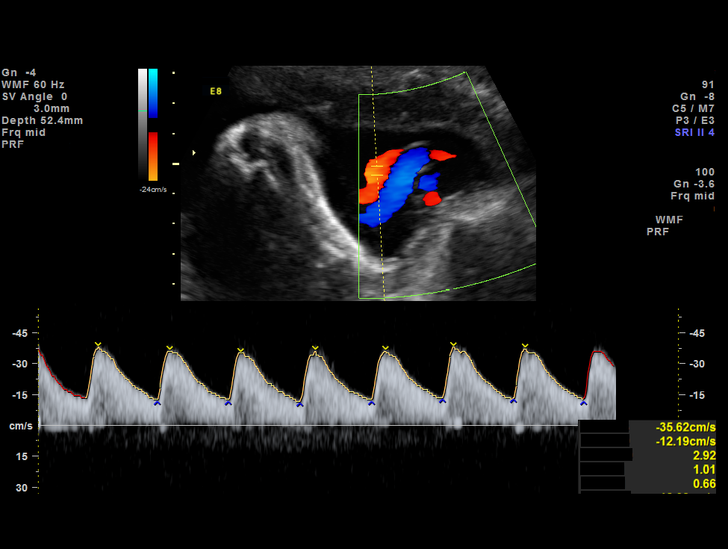
[im 21/21]
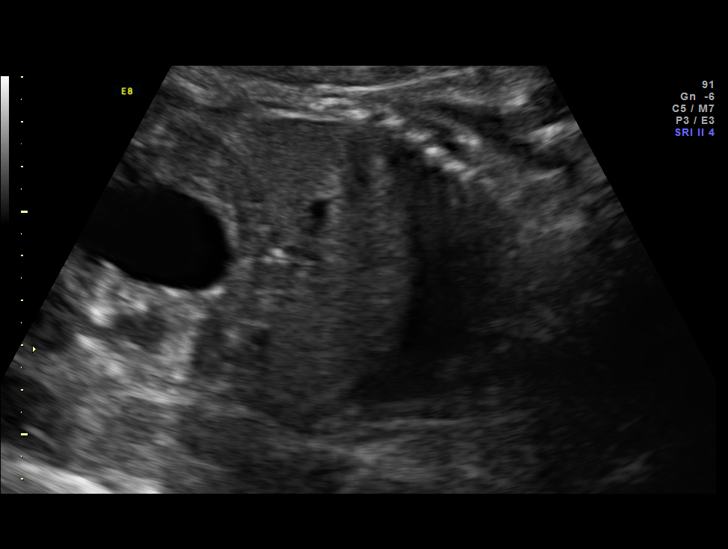

[13 of 21 positions shown; findings below may reference images not displayed]

OBSTETRICS REPORT
                      (Signed Final 07/18/2013 [DATE])

Service(s) Provided

 US UA CORD DOPPLER                                    76820.0
Indications

 Hypertension - Chronic/Pre-existing
 Diabetes - Pregestational, Class B (onset [AGE]
 or >, with duration < 81yrs)
 Size less than dates (Small for gestational [AGE]
 FGR)
Fetal Evaluation

 Num Of Fetuses:    1
 Fetal Heart Rate:  145                          bpm
 Cardiac Activity:  Observed
 Presentation:      Cephalic
 Placenta:          Posterior, above cervical
                    os
 P. Cord            Previously Visualized
 Insertion:

 Amniotic Fluid
 AFI FV:      Subjectively within normal limits
 AFI Sum:     15.29   cm       54  %Tile     Larg Pckt:    4.72  cm
 RUQ:   4.72    cm   RLQ:    3.42   cm    LUQ:   2.88    cm   LLQ:    4.27   cm
Biophysical Evaluation

 Amniotic F.V:   Within normal limits       F. Tone:        Observed
 F. Movement:    Observed                   Score:          [DATE]
 F. Breathing:   Not Observed
Gestational Age

 LMP:           32w 5d        Date:  12/01/12                 EDD:   09/07/13
 Best:          32w 5d     Det. By:  LMP  (12/01/12)          EDD:   09/07/13
Doppler - Fetal Vessels

 Umbilical Artery
 S/D:   3.49           89  %tile       RI:
 PI:    1.21                           PSV:       43.94   cm/s
Cervix Uterus Adnexa

 Cervix:       Not visualized (advanced GA >96wks)
Impression

 Single IUP at 32 [DATE] weeks
 CHTN on Labetolol, Class B diabetes, lagging AC on
 previous ultrasound (< 5th %tile) with EFW at the 17th %tile.
 BPP [DATE] (-2 for absent breathing movement)
 Reactive NST (on floor)
 UA Dopplers - somewhat elevated, but no evidence of absent
 or reversed diastolic flow
 Normal amniotic fluid volume
Recommendations

 Continue at least 2x weekly testing (at least daily NSTs while
 inpatient)
 Follow up growth scan next week.

 questions or concerns.

## 2013-09-16 ENCOUNTER — Ambulatory Visit (INDEPENDENT_AMBULATORY_CARE_PROVIDER_SITE_OTHER): Payer: Medicaid Other | Admitting: Nurse Practitioner

## 2013-09-16 ENCOUNTER — Encounter: Payer: Self-pay | Admitting: Nurse Practitioner

## 2013-09-16 VITALS — BP 157/105 | HR 91 | Temp 98.6°F | Ht 64.0 in | Wt 217.0 lb

## 2013-09-16 DIAGNOSIS — Z309 Encounter for contraceptive management, unspecified: Secondary | ICD-10-CM

## 2013-09-16 DIAGNOSIS — E119 Type 2 diabetes mellitus without complications: Secondary | ICD-10-CM

## 2013-09-16 DIAGNOSIS — B373 Candidiasis of vulva and vagina: Secondary | ICD-10-CM

## 2013-09-16 DIAGNOSIS — Z3049 Encounter for surveillance of other contraceptives: Secondary | ICD-10-CM

## 2013-09-16 DIAGNOSIS — A609 Anogenital herpesviral infection, unspecified: Secondary | ICD-10-CM

## 2013-09-16 DIAGNOSIS — I10 Essential (primary) hypertension: Secondary | ICD-10-CM

## 2013-09-16 DIAGNOSIS — B3731 Acute candidiasis of vulva and vagina: Secondary | ICD-10-CM

## 2013-09-16 DIAGNOSIS — B009 Herpesviral infection, unspecified: Secondary | ICD-10-CM

## 2013-09-16 LAB — TSH: TSH: 0.572 u[IU]/mL (ref 0.350–4.500)

## 2013-09-16 MED ORDER — MEDROXYPROGESTERONE ACETATE 104 MG/0.65ML ~~LOC~~ SUSP
104.0000 mg | Freq: Once | SUBCUTANEOUS | Status: AC
  Administered 2013-09-16: 104 mg via SUBCUTANEOUS

## 2013-09-16 MED ORDER — FLUCONAZOLE 150 MG PO TABS: 150.0000 mg | ORAL_TABLET | Freq: Once | ORAL | Status: DC

## 2013-09-16 MED ORDER — VALACYCLOVIR HCL 500 MG PO TABS: 500.0000 mg | ORAL_TABLET | Freq: Two times a day (BID) | ORAL | Status: DC

## 2013-09-16 MED ORDER — MEDROXYPROGESTERONE ACETATE 104 MG/0.65ML ~~LOC~~ SUSP: 104.0000 mg | SUBCUTANEOUS | Status: DC

## 2013-09-16 NOTE — Progress Notes (Signed)
History:  Rebecca Rhodes a 32 y.o. G1P0101 who presents to clinic today for Post partum care. She delivered via C/S a baby girl. She had an attempted induction secondary to HTN. The induction failed and she had a C/S at 36 weeks and 5 days. She also has Diabetes and obesity. Her plan for birth control is BCP as she plans another pregnancy in 1-2 years. Today her main complaint is a vaginal infection and a HSV outbreak. She is bonding well with baby and not breast feeding. She had some baby blues and that has resolved. States she has not taken her BP medications all last week.  The following portions of the patient's history were reviewed and updated as appropriate: allergies, current medications, past family history, past medical history, past social history, past surgical history and problem list.  Review of Systems:    Objective:  Physical Exam BP 157/105  Pulse 91  Temp(Src) 98.6 F (37 C) (Oral)  Ht 5\' 4"  (1.626 m)  Wt 217 lb (98.431 kg)  BMI 37.23 kg/m2  Breastfeeding? Yes GENERAL: Obese,Well-developed, well-nourished female in no acute distress.  HEENT: Normocephalic, atraumatic.  NECK: Supple. Normal thyroid.  LUNGS: Normal rate. Clear to auscultation bilaterally.  HEART: Regular rate and rhythm with no adventitious sounds.  ABDOMEN: Soft, nontender, nondistended. No organomegaly. Normal bowel sounds appreciated in all quadrants. C/S scar well healed PELVIC: Yeasty looking external female genitalia. Vagina is pink and rugated.  Discharge is creamy with chunks and odor. Normal cervix contour.Marland Kitchen Uterus is normal in size. No adnexal mass or tenderness.  EXTREMITIES: No cyanosis, clubbing, or edema, 2+ distal pulses.   Labs and Imaging No results found.  Assessment & Plan:  Assessment: Post Partum Contraception HSV Vaginal yeast HTN  Diabetes Mellitis  Plans:  DepoProvera 104 mg sq every 13 weeks x 1 year Valtrex Rx Diflucan Rx Referred to [facility] for control  of HYTN and DM Check TSH today  Carolynn Serve, NP 09/16/2013 2:15 PM

## 2013-09-16 NOTE — Patient Instructions (Signed)
Contraception Choices  Contraception (birth control) is the use of any methods or devices to prevent pregnancy. Below are some methods to help avoid pregnancy.  HORMONAL METHODS   · Contraceptive implant. This is a thin, plastic tube containing progesterone hormone. It does not contain estrogen hormone. Your caregiver inserts the tube in the inner part of the upper arm. The tube can remain in place for up to 3 years. After 3 years, the implant must be removed. The implant prevents the ovaries from releasing an egg (ovulation), thickens the cervical mucus which prevents sperm from entering the uterus, and thins the lining of the inside of the uterus.  · Progesterone-only injections. These injections are given every 3 months by your caregiver to prevent pregnancy. This synthetic progesterone hormone stops the ovaries from releasing eggs. It also thickens cervical mucus and changes the uterine lining. This makes it harder for sperm to survive in the uterus.  · Birth control pills. These pills contain estrogen and progesterone hormone. They work by stopping the egg from forming in the ovary (ovulation). Birth control pills are prescribed by a caregiver. Birth control pills can also be used to treat heavy periods.  · Minipill. This type of birth control pill contains only the progesterone hormone. They are taken every day of each month and must be prescribed by your caregiver.  · Birth control patch. The patch contains hormones similar to those in birth control pills. It must be changed once a week and is prescribed by a caregiver.  · Vaginal ring. The ring contains hormones similar to those in birth control pills. It is left in the vagina for 3 weeks, removed for 1 week, and then a new one is put back in place. The patient must be comfortable inserting and removing the ring from the vagina. A caregiver's prescription is necessary.  · Emergency contraception. Emergency contraceptives prevent pregnancy after unprotected  sexual intercourse. This pill can be taken right after sex or up to 5 days after unprotected sex. It is most effective the sooner you take the pills after having sexual intercourse. Emergency contraceptive pills are available without a prescription. Check with your pharmacist. Do not use emergency contraception as your only form of birth control.  BARRIER METHODS   · Female condom. This is a thin sheath (latex or rubber) that is worn over the penis during sexual intercourse. It can be used with spermicide to increase effectiveness.  · Female condom. This is a soft, loose-fitting sheath that is put into the vagina before sexual intercourse.  · Diaphragm. This is a soft, latex, dome-shaped barrier that must be fitted by a caregiver. It is inserted into the vagina, along with a spermicidal jelly. It is inserted before intercourse. The diaphragm should be left in the vagina for 6 to 8 hours after intercourse.  · Cervical cap. This is a round, soft, latex or plastic cup that fits over the cervix and must be fitted by a caregiver. The cap can be left in place for up to 48 hours after intercourse.  · Sponge. This is a soft, circular piece of polyurethane foam. The sponge has spermicide in it. It is inserted into the vagina after wetting it and before sexual intercourse.  · Spermicides. These are chemicals that kill or block sperm from entering the cervix and uterus. They come in the form of creams, jellies, suppositories, foam, or tablets. They do not require a prescription. They are inserted into the vagina with an applicator before having sexual intercourse.   The process must be repeated every time you have sexual intercourse.  INTRAUTERINE CONTRACEPTION  · Intrauterine device (IUD). This is a T-shaped device that is put in a woman's uterus during a menstrual period to prevent pregnancy. There are 2 types:  · Copper IUD. This type of IUD is wrapped in copper wire and is placed inside the uterus. Copper makes the uterus and  fallopian tubes produce a fluid that kills sperm. It can stay in place for 10 years.  · Hormone IUD. This type of IUD contains the hormone progestin (synthetic progesterone). The hormone thickens the cervical mucus and prevents sperm from entering the uterus, and it also thins the uterine lining to prevent implantation of a fertilized egg. The hormone can weaken or kill the sperm that get into the uterus. It can stay in place for 5 years.  PERMANENT METHODS OF CONTRACEPTION  · Female tubal ligation. This is when the woman's fallopian tubes are surgically sealed, tied, or blocked to prevent the egg from traveling to the uterus.  · Female sterilization. This is when the female has the tubes that carry sperm tied off (vasectomy). This blocks sperm from entering the vagina during sexual intercourse. After the procedure, the man can still ejaculate fluid (semen).  NATURAL PLANNING METHODS  · Natural family planning. This is not having sexual intercourse or using a barrier method (condom, diaphragm, cervical cap) on days the woman could become pregnant.  · Calendar method. This is keeping track of the length of each menstrual cycle and identifying when you are fertile.  · Ovulation method. This is avoiding sexual intercourse during ovulation.  · Symptothermal method. This is avoiding sexual intercourse during ovulation, using a thermometer and ovulation symptoms.  · Post-ovulation method. This is timing sexual intercourse after you have ovulated.  Regardless of which type or method of contraception you choose, it is important that you use condoms to protect against the transmission of sexually transmitted diseases (STDs). Talk with your caregiver about which form of contraception is most appropriate for you.  Document Released: 11/28/2005 Document Revised: 02/20/2012 Document Reviewed: 04/06/2011  [employer]® Patient Information ©2014 [employer].

## 2013-09-17 LAB — WET PREP, GENITAL
Clue Cells Wet Prep HPF POC: NONE SEEN
Trich, Wet Prep: NONE SEEN

## 2013-10-07 ENCOUNTER — Ambulatory Visit (INDEPENDENT_AMBULATORY_CARE_PROVIDER_SITE_OTHER): Payer: Medicaid Other | Admitting: General Practice

## 2013-10-07 ENCOUNTER — Encounter: Payer: Self-pay | Admitting: *Deleted

## 2013-10-07 VITALS — BP 132/78 | HR 100 | Temp 98.4°F | Ht 64.0 in | Wt 213.4 lb

## 2013-10-07 DIAGNOSIS — Z136 Encounter for screening for cardiovascular disorders: Secondary | ICD-10-CM

## 2013-10-07 DIAGNOSIS — Z0130 Encounter for examination of blood pressure without abnormal findings: Secondary | ICD-10-CM

## 2013-10-07 NOTE — Progress Notes (Signed)
Reviewed patient's postpartum BP's and today's blood pressures with Dr Erin Fulling. Dr Erin Fulling stated it was fine for the patient to return to work and that she needed to continue to take all of her blood pressure medications daily and to make sure she follows up with [facility] for continued management. Reviewed all this information with the patient and told her that if she has not heard from [facility] in a few more weeks then she should call us or them to make an appt there. Patient verbalized understanding.

## 2013-10-17 ENCOUNTER — Other Ambulatory Visit: Payer: Self-pay

## 2013-10-22 ENCOUNTER — Encounter: Payer: Self-pay | Admitting: Family Medicine

## 2013-10-22 ENCOUNTER — Other Ambulatory Visit (HOSPITAL_COMMUNITY)
Admission: RE | Admit: 2013-10-22 | Discharge: 2013-10-22 | Disposition: A | Discharge status: Home / Self Care | Payer: Medicaid Other | Source: Ambulatory Visit | Attending: Family Medicine | Admitting: Family Medicine

## 2013-10-22 ENCOUNTER — Ambulatory Visit (INDEPENDENT_AMBULATORY_CARE_PROVIDER_SITE_OTHER): Payer: Medicaid Other | Admitting: Family Medicine

## 2013-10-22 VITALS — BP 146/106 | HR 99 | Temp 99.4°F | Ht 64.0 in | Wt 210.0 lb

## 2013-10-22 DIAGNOSIS — I10 Essential (primary) hypertension: Secondary | ICD-10-CM

## 2013-10-22 DIAGNOSIS — N760 Acute vaginitis: Secondary | ICD-10-CM

## 2013-10-22 DIAGNOSIS — B3731 Acute candidiasis of vulva and vagina: Secondary | ICD-10-CM

## 2013-10-22 DIAGNOSIS — L02429 Furuncle of limb, unspecified: Secondary | ICD-10-CM

## 2013-10-22 DIAGNOSIS — B373 Candidiasis of vulva and vagina: Secondary | ICD-10-CM | POA: Insufficient documentation

## 2013-10-22 DIAGNOSIS — Z113 Encounter for screening for infections with a predominantly sexual mode of transmission: Secondary | ICD-10-CM | POA: Insufficient documentation

## 2013-10-22 DIAGNOSIS — E119 Type 2 diabetes mellitus without complications: Secondary | ICD-10-CM

## 2013-10-22 LAB — POCT GLYCOSYLATED HEMOGLOBIN (HGB A1C): Hemoglobin A1C: >14

## 2013-10-22 LAB — POCT WET PREP (WET MOUNT): Clue Cells Wet Prep Whiff POC: NEGATIVE

## 2013-10-22 MED ORDER — FLUCONAZOLE 150 MG PO TABS: 150.0000 mg | ORAL_TABLET | Freq: Once | ORAL | Status: DC

## 2013-10-22 MED ORDER — LISINOPRIL 5 MG PO TABS: 5.0000 mg | ORAL_TABLET | Freq: Every day | ORAL | Status: DC

## 2013-10-22 MED ORDER — GLIPIZIDE 10 MG PO TABS: 10.0000 mg | ORAL_TABLET | Freq: Two times a day (BID) | ORAL | Status: DC

## 2013-10-22 MED ORDER — HYDROCHLOROTHIAZIDE 25 MG PO TABS: 25.0000 mg | ORAL_TABLET | Freq: Every day | ORAL | Status: DC

## 2013-10-22 MED ORDER — METFORMIN HCL 500 MG PO TABS: 500.0000 mg | ORAL_TABLET | Freq: Two times a day (BID) | ORAL | Status: DC

## 2013-10-22 MED ORDER — GLIPIZIDE 10 MG PO TABS: 10.0000 mg | ORAL_TABLET | Freq: Every day | ORAL | Status: DC

## 2013-10-22 NOTE — Patient Instructions (Addendum)
It was nice to meet you today.  The boils on your legs will need to be incised and drained. You have an appointment on 11/20 at 10:30am.  Diabetes: Start metformin 500mg  (1 tablet) once a day. After a week if tolerating, go to twice daily (once in morning, once at night) Start glipizide 10mg  (1 tablet) once a day.   Yeast infection: Take 1 tablet of diflucan for 2 days.

## 2013-10-22 NOTE — Assessment & Plan Note (Signed)
Poorly compliant with medication. Last took several days ago. Will refill prescriptions for lisinopril 5mg  and hctz 25mg , re-check in 2 weeks.

## 2013-10-22 NOTE — Assessment & Plan Note (Signed)
Poorly compliant. Stop insulin at this time. Restart metformin at 500mg  daily, plan to increase as tolerated. Start glipizide 10mg  daily. Will need to make slow changes, would likely require insulin therapy but due to noncompliance will start with just orals.

## 2013-10-22 NOTE — Progress Notes (Signed)
  Subjective:    Patient ID: Rebecca Rhodes, female    DOB: [birth year], 32 y.o.   MRN: [id number]  HPI  Establish care visit  # Diabetes - diagnosed 2011 - poorly compliant with medications. Currently prescribed insulin only, though she is having trouble affording her medications - was on metformin in the past, she is unsure why it was stopped - does not check her sugars that often  # Hypertension - diagnosed 2000 - was on hctz, lisinopril, amlodipine - poorly compliant  # Boils on legs - has a history of them, says she has been to the OR to remove them before - first noticed 2 weeks ago - painful - able to point out 3, back of left upper leg, back of right upper leg, and right side of labia likely a bartholin's - denies fevers, chills  # Yeast infection - has had these in the past, feels like it is back - itchy, discharge  # [facility] maintenance [facility] - Tetanus vaccine 2014 - Pneumonia vaccine 2013  Review of Systems Per HPI    Objective:   Physical Exam BP 146/106  Pulse 99  Temp(Src) 99.4 F (37.4 C) (Oral)  Ht 5\' 4"  (1.626 m)  Wt 210 lb (95.255 kg)  BMI 36.03 kg/m2  General: NAD HEENT: PERRL, EOMI CV: RRR, normal heart sounds, no murmurs Resp: CTAB, effort normal Abd: obese, soft, nondistended, nontender.  Ext: no edema. 2+ PT and radial pulses Left leg abscess: back of leg. approx 3-4cm diameter. superficial, not draining. Tender, indurated without fluctuation.  Right leg abscess: appears deeper, more nodular feeling (?lymph node), nontender at this time. GU: likely right sided bartholin cyst. Vaginal mucosa erythematous/irritated, thick yellow/Dosch mucous present.      Assessment & Plan:  See Problem List documentation

## 2013-10-22 NOTE — Assessment & Plan Note (Signed)
2 skin abscesses that will require drainage (left and right side, though right appears to be deeper and may not be drainable). Also appears to have a right sided bartholin cyst. Attempted to schedule with procedure clinic on Thursday but she has work (restarted this week). Procedure clinic scheduled for next Thursday with return precautions given for worsening symptoms, fevers/chills.

## 2013-10-22 NOTE — Assessment & Plan Note (Signed)
Recurrent symptoms, last treated with diflucan 150mg  x 2 1 month ago. Wet prep positive for yeast today. Will treat again with diflucan, but likely will require treatment as resistant yeast infection.

## 2013-10-31 ENCOUNTER — Ambulatory Visit (INDEPENDENT_AMBULATORY_CARE_PROVIDER_SITE_OTHER): Payer: Medicaid Other | Admitting: Family Medicine

## 2013-10-31 VITALS — BP 157/109 | HR 89 | Temp 97.9°F | Ht 64.0 in | Wt 210.6 lb

## 2013-10-31 DIAGNOSIS — L0292 Furuncle, unspecified: Secondary | ICD-10-CM

## 2013-10-31 DIAGNOSIS — L851 Acquired keratosis [keratoderma] palmaris et plantaris: Secondary | ICD-10-CM

## 2013-10-31 DIAGNOSIS — L0293 Carbuncle, unspecified: Secondary | ICD-10-CM

## 2013-10-31 DIAGNOSIS — L850 Acquired ichthyosis: Secondary | ICD-10-CM

## 2013-10-31 MED ORDER — SULFAMETHOXAZOLE-TMP DS 800-160 MG PO TABS: 1.0000 | ORAL_TABLET | Freq: Two times a day (BID) | ORAL | Status: DC

## 2013-10-31 MED ORDER — TRIAMCINOLONE ACETONIDE 0.1 % EX CREA: 1.0000 "application " | TOPICAL_CREAM | Freq: Two times a day (BID) | CUTANEOUS | Status: DC

## 2013-10-31 MED ORDER — FLUCONAZOLE 100 MG PO TABS: ORAL_TABLET | ORAL | Status: DC

## 2013-10-31 NOTE — Patient Instructions (Signed)
Thank you for coming in today. For your boils we are giving you an antibiotic to take twice a day for the next month, until you follow up with Dr. Waynetta Sandy.  It will also be important to keep your skin moist with vaseline or the prescription cream that we sent in. Getting your diabetes under control will help your immune system to effectively fight these infections so please take your diabetes medications as prescribed by Dr. Waynetta Sandy.  The long-term antibiotic may cause a yeast infection so we are also writing a prescription for diflucan for you to take every Monday, Wednesday, Friday while on the antibiotics.

## 2013-11-05 DIAGNOSIS — L850 Acquired ichthyosis: Secondary | ICD-10-CM | POA: Insufficient documentation

## 2013-11-05 NOTE — Progress Notes (Signed)
Patient ID: Rebecca Rhodes, female   DOB: [birth year], 32 y.o.   MRN: [id number] Patient schedule for I and D. of several boils. Since she was seen last, the boils have ruptured. She also was going to be seen for evaluation of a questionable Bartholin's gland cyst but that area has also resolved.  In the last 6 months or so she's had quite a few boils on her lower extremities. She's not sure why this is. She's not getting any on her arms or trunk. A rupture eventually to leave a little bit of a scar.  PERTINENT  PMH / PSH: Diabetes mellitus in poor control She had a baby by C-section in August of 2014   OBJECTIVE: Well-developed overweight African American female no acute distress SKIN: Her lower extremity skin and fat of the pelvis is quite dry and scaly, looks almost like ichthyosis except the scale is thinner than. True ichthyosis. Oddly enough, her trunk and upper extremities do not have these skin changes. On her lower legs particularly and some on her posterior thighs are areas where she's had previous boils that have ruptured in the canal left some hyperpigmented scars. The most recent boil on the left posterior thigh also has a small area of induration around it but there is no sign of acute infection.  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 Typically this is worse in the winter months which would certainly coincide with the season were having now. Also wonder if her out-of-control diabetes is contributing to this. For the next month I'm going to put her on some antibiotic therapy, am also going to put her on prophylactic candidal infection therapy and some topical emollients. She can followup either with me or with her primary care physician Dr. Toney Rakes.  I also gave her information for application for the orange card today

## 2013-12-23 ENCOUNTER — Encounter (HOSPITAL_COMMUNITY): Payer: Self-pay | Admitting: Emergency Medicine

## 2013-12-23 ENCOUNTER — Inpatient Hospital Stay (HOSPITAL_COMMUNITY): Payer: Medicaid Other

## 2013-12-23 ENCOUNTER — Inpatient Hospital Stay (HOSPITAL_COMMUNITY)
Admission: EM | Admit: 2013-12-23 | Discharge: 2013-12-26 | DRG: 639 | Disposition: A | Discharge status: Home / Self Care | Payer: Medicaid Other | Attending: Family Medicine | Admitting: Family Medicine

## 2013-12-23 DIAGNOSIS — B009 Herpesviral infection, unspecified: Secondary | ICD-10-CM

## 2013-12-23 DIAGNOSIS — E1110 Type 2 diabetes mellitus with ketoacidosis without coma: Secondary | ICD-10-CM | POA: Diagnosis present

## 2013-12-23 DIAGNOSIS — O98519 Other viral diseases complicating pregnancy, unspecified trimester: Secondary | ICD-10-CM

## 2013-12-23 DIAGNOSIS — L02429 Furuncle of limb, unspecified: Secondary | ICD-10-CM | POA: Diagnosis present

## 2013-12-23 DIAGNOSIS — Z833 Family history of diabetes mellitus: Secondary | ICD-10-CM

## 2013-12-23 DIAGNOSIS — E119 Type 2 diabetes mellitus without complications: Secondary | ICD-10-CM

## 2013-12-23 DIAGNOSIS — E1310 Other specified diabetes mellitus with ketoacidosis without coma: Principal | ICD-10-CM | POA: Diagnosis present

## 2013-12-23 DIAGNOSIS — Z9119 Patient's noncompliance with other medical treatment and regimen: Secondary | ICD-10-CM

## 2013-12-23 DIAGNOSIS — B373 Candidiasis of vulva and vagina: Secondary | ICD-10-CM | POA: Diagnosis present

## 2013-12-23 DIAGNOSIS — R739 Hyperglycemia, unspecified: Secondary | ICD-10-CM | POA: Diagnosis present

## 2013-12-23 DIAGNOSIS — I10 Essential (primary) hypertension: Secondary | ICD-10-CM | POA: Diagnosis present

## 2013-12-23 DIAGNOSIS — Z91199 Patient's noncompliance with other medical treatment and regimen due to unspecified reason: Secondary | ICD-10-CM

## 2013-12-23 DIAGNOSIS — O10019 Pre-existing essential hypertension complicating pregnancy, unspecified trimester: Secondary | ICD-10-CM

## 2013-12-23 DIAGNOSIS — E785 Hyperlipidemia, unspecified: Secondary | ICD-10-CM | POA: Diagnosis present

## 2013-12-23 DIAGNOSIS — Z794 Long term (current) use of insulin: Secondary | ICD-10-CM

## 2013-12-23 DIAGNOSIS — A6000 Herpesviral infection of urogenital system, unspecified: Secondary | ICD-10-CM | POA: Diagnosis present

## 2013-12-23 DIAGNOSIS — B3731 Acute candidiasis of vulva and vagina: Secondary | ICD-10-CM | POA: Diagnosis present

## 2013-12-23 LAB — URINALYSIS, ROUTINE W REFLEX MICROSCOPIC
Bilirubin Urine: NEGATIVE
Glucose, UA: >1000 mg/dL — AB
Ketones, ur: >80 mg/dL — AB
Leukocytes, UA: NEGATIVE
Nitrite: NEGATIVE
Protein, ur: NEGATIVE mg/dL
Specific Gravity, Urine: 1.035 — ABNORMAL HIGH (ref 1.005–1.030)
Urobilinogen, UA: 0.2 mg/dL (ref 0.0–1.0)
pH: 5 (ref 5.0–8.0)

## 2013-12-23 LAB — BASIC METABOLIC PANEL WITH GFR
BUN: 9 mg/dL (ref 6–23)
BUN: 8 mg/dL (ref 6–23)
CO2: 20 meq/L (ref 19–32)
CO2: 20 meq/L (ref 19–32)
Calcium: 7.6 mg/dL — ABNORMAL LOW (ref 8.4–10.5)
Calcium: 7.8 mg/dL — ABNORMAL LOW (ref 8.4–10.5)
Chloride: 101 meq/L (ref 96–112)
Chloride: 101 meq/L (ref 96–112)
Creatinine, Ser: 0.65 mg/dL (ref 0.50–1.10)
Creatinine, Ser: 0.66 mg/dL (ref 0.50–1.10)
GFR calc Af Amer: >90 mL/min
GFR calc Af Amer: >90 mL/min
GFR calc non Af Amer: >90 mL/min
GFR calc non Af Amer: >90 mL/min
Glucose, Bld: 251 mg/dL — ABNORMAL HIGH (ref 70–99)
Glucose, Bld: 323 mg/dL — ABNORMAL HIGH (ref 70–99)
Potassium: 3.1 meq/L — ABNORMAL LOW (ref 3.7–5.3)
Potassium: 3.2 meq/L — ABNORMAL LOW (ref 3.7–5.3)
Sodium: 136 meq/L — ABNORMAL LOW (ref 137–147)
Sodium: 135 meq/L — ABNORMAL LOW (ref 137–147)

## 2013-12-23 LAB — CBC
HCT: 39.9 % (ref 36.0–46.0)
Hemoglobin: 13.5 g/dL (ref 12.0–15.0)
MCH: 29.7 pg (ref 26.0–34.0)
MCHC: 33.8 g/dL (ref 30.0–36.0)
MCV: 87.7 fL (ref 78.0–100.0)
Platelets: 304 10*3/uL (ref 150–400)
RBC: 4.55 MIL/uL (ref 3.87–5.11)
RDW: 14 % (ref 11.5–15.5)
WBC: 7.1 10*3/uL (ref 4.0–10.5)

## 2013-12-23 LAB — COMPREHENSIVE METABOLIC PANEL
ALT: <5 U/L (ref 0–35)
AST: 11 U/L (ref 0–37)
Albumin: 3.5 g/dL (ref 3.5–5.2)
Alkaline Phosphatase: 106 U/L (ref 39–117)
BUN: 13 mg/dL (ref 6–23)
CO2: 14 mEq/L — ABNORMAL LOW (ref 19–32)
Calcium: 8.2 mg/dL — ABNORMAL LOW (ref 8.4–10.5)
Chloride: 88 mEq/L — ABNORMAL LOW (ref 96–112)
Creatinine, Ser: 0.78 mg/dL (ref 0.50–1.10)
GFR calc Af Amer: >90 mL/min (ref >90)
GFR calc non Af Amer: >90 mL/min (ref >90)
Glucose, Bld: 835 mg/dL (ref 70–99)
Potassium: 4.4 mEq/L (ref 3.7–5.3)
Sodium: 126 mEq/L — ABNORMAL LOW (ref 137–147)
Total Bilirubin: 0.3 mg/dL (ref 0.3–1.2)
Total Protein: 7.8 g/dL (ref 6.0–8.3)

## 2013-12-23 LAB — GLUCOSE, CAPILLARY
GLUCOSE-CAPILLARY: 417 mg/dL — AB (ref 70–99)
GLUCOSE-CAPILLARY: 265 mg/dL — AB (ref 70–99)
Glucose-Capillary: >600 mg/dL (ref 70–99)
Glucose-Capillary: 377 mg/dL — ABNORMAL HIGH (ref 70–99)
Glucose-Capillary: 273 mg/dL — ABNORMAL HIGH (ref 70–99)
Glucose-Capillary: 246 mg/dL — ABNORMAL HIGH (ref 70–99)
Glucose-Capillary: 287 mg/dL — ABNORMAL HIGH (ref 70–99)
Glucose-Capillary: 295 mg/dL — ABNORMAL HIGH (ref 70–99)

## 2013-12-23 LAB — URINE MICROSCOPIC-ADD ON

## 2013-12-23 LAB — BASIC METABOLIC PANEL
BUN: 8 mg/dL (ref 6–23)
CO2: 23 meq/L (ref 19–32)
Calcium: 8 mg/dL — ABNORMAL LOW (ref 8.4–10.5)
Chloride: 101 mEq/L (ref 96–112)
Creatinine, Ser: 0.69 mg/dL (ref 0.50–1.10)
GFR calc Af Amer: >90 mL/min (ref >90)
GFR calc non Af Amer: >90 mL/min (ref >90)
Glucose, Bld: 314 mg/dL — ABNORMAL HIGH (ref 70–99)
POTASSIUM: 3.6 meq/L — AB (ref 3.7–5.3)
SODIUM: 137 meq/L (ref 137–147)

## 2013-12-23 LAB — POCT I-STAT 3, VENOUS BLOOD GAS (G3P V)
ACID-BASE DEFICIT: 10 mmol/L — AB (ref 0.0–2.0)
Bicarbonate: 14.8 mEq/L — ABNORMAL LOW (ref 20.0–24.0)
O2 Saturation: 93 %
PH VEN: 7.308 — AB (ref 7.250–7.300)
PO2 VEN: 72 mmHg — AB (ref 30.0–45.0)
TCO2: 16 mmol/L (ref 0–100)
pCO2, Ven: 29.5 mmHg — ABNORMAL LOW (ref 45.0–50.0)

## 2013-12-23 LAB — POCT I-STAT, CHEM 8
BUN: 13 mg/dL (ref 6–23)
CALCIUM ION: 1.16 mmol/L (ref 1.12–1.23)
CHLORIDE: 97 meq/L (ref 96–112)
Creatinine, Ser: 0.8 mg/dL (ref 0.50–1.10)
Glucose, Bld: >700 mg/dL (ref 70–99)
HCT: 46 % (ref 36.0–46.0)
Hemoglobin: 15.6 g/dL — ABNORMAL HIGH (ref 12.0–15.0)
Potassium: 4.3 mEq/L (ref 3.7–5.3)
Sodium: 128 mEq/L — ABNORMAL LOW (ref 137–147)
TCO2: 15 mmol/L (ref 0–100)

## 2013-12-23 LAB — KETONES, QUALITATIVE

## 2013-12-23 LAB — POCT PREGNANCY, URINE: Preg Test, Ur: NEGATIVE

## 2013-12-23 MED ORDER — ACETAMINOPHEN 650 MG RE SUPP: 650.0000 mg | Freq: Four times a day (QID) | RECTAL | Status: DC | PRN

## 2013-12-23 MED ORDER — LISINOPRIL 5 MG PO TABS
5.0000 mg | ORAL_TABLET | Freq: Every day | ORAL | Status: DC
  Administered 2013-12-24: 5 mg via ORAL
  Filled 2013-12-23: qty 1

## 2013-12-23 MED ORDER — POTASSIUM CHLORIDE CRYS ER 20 MEQ PO TBCR
40.0000 meq | EXTENDED_RELEASE_TABLET | Freq: Once | ORAL | Status: AC
  Administered 2013-12-23: 40 meq via ORAL
  Filled 2013-12-23: qty 2

## 2013-12-23 MED ORDER — INSULIN REGULAR BOLUS VIA INFUSION
0.0000 [IU] | Freq: Three times a day (TID) | INTRAVENOUS | Status: DC
Start: 2013-12-24 — End: 2013-12-23

## 2013-12-23 MED ORDER — SODIUM CHLORIDE 0.9 % IV SOLN
1000.0000 mL | Freq: Once | INTRAVENOUS | Status: AC
  Administered 2013-12-23: 1000 mL via INTRAVENOUS

## 2013-12-23 MED ORDER — INSULIN REGULAR BOLUS VIA INFUSION
0.0000 [IU] | Freq: Three times a day (TID) | INTRAVENOUS | Status: DC
  Filled 2013-12-23: qty 10

## 2013-12-23 MED ORDER — FLUCONAZOLE 100 MG PO TABS
150.0000 mg | ORAL_TABLET | Freq: Once | ORAL | Status: AC
  Administered 2013-12-23: 150 mg via ORAL
  Filled 2013-12-23: qty 2

## 2013-12-23 MED ORDER — LISINOPRIL 10 MG PO TABS: 10.0000 mg | ORAL_TABLET | Freq: Every day | ORAL | Status: DC

## 2013-12-23 MED ORDER — VALACYCLOVIR HCL 500 MG PO TABS
500.0000 mg | ORAL_TABLET | Freq: Two times a day (BID) | ORAL | Status: AC
  Administered 2013-12-23 – 2013-12-26 (×6): 500 mg via ORAL
  Filled 2013-12-23 (×7): qty 1

## 2013-12-23 MED ORDER — SODIUM CHLORIDE 0.9 % IV SOLN: INTRAVENOUS | Status: DC

## 2013-12-23 MED ORDER — DEXTROSE-NACL 5-0.45 % IV SOLN
INTRAVENOUS | Status: DC
  Administered 2013-12-23 – 2013-12-24 (×2): via INTRAVENOUS

## 2013-12-23 MED ORDER — DEXTROSE-NACL 5-0.45 % IV SOLN: INTRAVENOUS | Status: DC

## 2013-12-23 MED ORDER — ONDANSETRON HCL 4 MG/2ML IJ SOLN: 4.0000 mg | Freq: Three times a day (TID) | INTRAMUSCULAR | Status: DC | PRN

## 2013-12-23 MED ORDER — SODIUM CHLORIDE 0.45 % IV SOLN
INTRAVENOUS | Status: DC
  Administered 2013-12-23 – 2013-12-26 (×5): via INTRAVENOUS
  Filled 2013-12-23 (×13): qty 1000

## 2013-12-23 MED ORDER — DEXTROSE 50 % IV SOLN: 25.0000 mL | INTRAVENOUS | Status: DC | PRN

## 2013-12-23 MED ORDER — ONDANSETRON HCL 4 MG PO TABS: 4.0000 mg | ORAL_TABLET | Freq: Four times a day (QID) | ORAL | Status: DC | PRN

## 2013-12-23 MED ORDER — ONDANSETRON HCL 4 MG/2ML IJ SOLN: 4.0000 mg | Freq: Four times a day (QID) | INTRAMUSCULAR | Status: DC | PRN

## 2013-12-23 MED ORDER — SODIUM CHLORIDE 0.9 % IV SOLN
INTRAVENOUS | Status: DC
  Administered 2013-12-23: 5.4 [IU]/h via INTRAVENOUS
  Administered 2013-12-24: 06:00:00 via INTRAVENOUS
  Filled 2013-12-23 (×2): qty 1

## 2013-12-23 MED ORDER — ACETAMINOPHEN 325 MG PO TABS
650.0000 mg | ORAL_TABLET | Freq: Four times a day (QID) | ORAL | Status: DC | PRN
  Administered 2013-12-25: 650 mg via ORAL
  Filled 2013-12-23: qty 2

## 2013-12-23 MED ORDER — SODIUM CHLORIDE 0.9 % IV SOLN
1000.0000 mL | INTRAVENOUS | Status: DC
  Administered 2013-12-23: 1000 mL via INTRAVENOUS

## 2013-12-23 MED ORDER — HEPARIN SODIUM (PORCINE) 5000 UNIT/ML IJ SOLN
5000.0000 [IU] | Freq: Three times a day (TID) | INTRAMUSCULAR | Status: DC
  Administered 2013-12-23 – 2013-12-26 (×7): 5000 [IU] via SUBCUTANEOUS
  Filled 2013-12-23 (×11): qty 1

## 2013-12-23 NOTE — ED Notes (Addendum)
Lab reported glucose=835 mg/dl; Lemont Fillersatyana, PA notified

## 2013-12-23 NOTE — ED Notes (Signed)
cbg done in triage.  

## 2013-12-23 NOTE — ED Notes (Signed)
Lab results reported to Dr.Kohut. 

## 2013-12-23 NOTE — H&P (Signed)
Family Medicine Teaching [facility] Admission History and Physical Service Pager: [phone]  Patient name: Rebecca Rhodes Medical record number: [id number] Date of birth: 05/02/1981 Age: 33 y.o. Gender: female  Primary Care Provider: Tawni CarnesWight, Andrew, MD Consultants: None Code Status: Full  Chief Complaint: Polyuria, polydipsia  Assessment and Plan: Rebecca SiKeisha Jaskolski is a 33 y.o. female presenting with polyuria/dipsia after running out of insulin 1 week ago, found to be in DKA. PMH is significant for T2DM and history of poor medication adherence, HTn, and hyperlipidemia.  Diabetic ketoacidosis: VBG: pH 7.30, bicarb: 14, glucose: 835, +ketones in serum and urine. No S/Sxs infection. AG: 24. Question of hyperosmotic hyperglycemic state vs DKA. Calculated serum concentration of 303 making hyperosmotic state less than likely since it requires osm>320.  Likely from non-compliance with medications since no obvious source of infection that could have caused this.  - s/p 2L NS bolus, continue 1/2NS at 125cc/hr, add D5 once CBGs <250 - Admit to SDU on insulin infusion, glucommander protocol and continue BMP q2h to monitor gap closure, then transition to subcutaneous insulin.  - Hold metformin and regular insulin. Will need to consider discharge insulin/oral regimen in light of limited finances. - Last Hb A1c >14.0 in Nov. 2014. Will repeat Hb A1c.  - CXR to exclude PNA  Metabolic derangements - Pseudohyponatremia: Corrected Na: 138; transition to 1/2NS - Will supplement K (currently 4.3) orally and in IVF to keep K between 4 and 5 - Hypocalcemia at 8.2, continue to monitor   HTN: Per EMR, has previously been on HCTZ, lisinopril, and amlodipine.  - resume home lisinopril 5mg  daily  Social/financial limitations: Source of medical non-adherence, which is primary cause of this admission.   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 Armen PickupFunches, on SDU for further management of DKA.   History of Present Illness: Rebecca SiKeisha Splawn is a 33 y.o. female presenting with a one week history of polydipsia, polyuria, and blurry vision.   She has T2DM and ran out of novolin insulin about a week ago, and hasn't been able to afford a refill. She has been eating and drinking well (more than usually). She denies nausea, vomiting, and abdominal pain. She did not check her blood sugar at this time. She also endorses transient feeling of difficulty breathing while sitting up, not worse on exertion, but no trouble with this before or since. Denies fevers, chills, cough, chest pain.   She was diagnosed with T2DM about 4 years ago, and was placed on metformin and insulin at the time of diagnosis. She has been noncompliant due to costs of medicines. Her last admission for DKA was in 2013. She reports that she would take medicines as directed if she could afford them. She does have supplies to check her blood sugar and does not check it very often (once every 1-2 months). Her insulin regimen was recently changed by her PCP on her first visit in November to metformin 500mg  daily and glipizide 10mg  daily, though she reports taking novolin 26u daily. She has not taken any glipizide recently.   She has a history of yeast infections and genital herpes and reports vaginal itching and HSV  outbreak at this time, as well as an emerging, painful recurrent boil on her left leg. Otherwise, she denies any cough or nasal congestion. She denies any dysuria or abdominal pain. She denies any skin sores other than the new onset of a boil.   Review Of Systems: Per HPI  with the following additions: None Otherwise 12 point review of systems was performed and was unremarkable.  Patient Active Problem List   Diagnosis Date Noted  . 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 Yeast infection involving the vagina and surrounding area 10/22/2013  . Boil, leg 06/24/2013  . 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 Benign essential hypertension antepartum 02/18/2013  . Acute respiratory failure 10/24/2012  . DKA (diabetic ketoacidoses) 06/04/2012  . HTN (hypertension) 06/04/2012  . Hyperlipidemia 06/04/2012  . Diabetes mellitus 06/04/2012   Past Medical History: Past Medical History  Diagnosis Date  . HTN (hypertension) 06/04/2012  . Hyperlipidemia 06/04/2012  . Diabetes mellitus 06/04/2012  . HSV-2 infection   . DKA (diabetic ketoacidoses)   . Acute respiratory failure    Past Surgical History: Past Surgical History  Procedure Laterality Date  . Irrigation and debridement abscess  10/31/2012    Procedure: IRRIGATION AND DEBRIDEMENT ABSCESS;  Surgeon: Cherylynn Ridges, MD;  Location: [facility] OR;  Service: General;  Laterality: Bilateral;  . Cesarean section N/A 08/08/2013    Procedure: CESAREAN SECTION;  Surgeon: Tereso Newcomer, MD;  Location: [facility] ORS;  Service: Obstetrics;  Laterality: N/A;   Social History: History  Substance Use Topics  . Smoking status: Never Smoker   . Smokeless tobacco: Never Used  . Alcohol Use: No   Additional social history: Has applied for medicaid but has not had a reply. Has never attempted to get the orange card.  Please also refer to relevant sections of EMR.  Family History: Family History  Problem Relation Age of Onset  . Hypertension Mother   . Stroke Mother   . Heart disease Father   . Diabetes Father   . Cancer Maternal Grandfather   . Hypertension Brother   . Diabetes Paternal Aunt   . Diabetes Paternal Uncle   . Diabetes Paternal Grandmother   . Diabetes Paternal Grandfather    Allergies and  Medications: Allergies  Allergen Reactions  . Penicillins Rash   Current Facility-Administered Medications on File Prior to Encounter  Medication Dose Route Frequency Provider Last Rate Last Dose  . TDaP (BOOSTRIX) injection 0.5 mL  0.5 mL Intramuscular Once Reva Bores, MD       Current Outpatient Prescriptions on File Prior to Encounter  Medication Sig Dispense Refill  . insulin regular (NOVOLIN R,HUMULIN R) 100 units/mL injection Inject 26 Units into the skin daily before breakfast.       . lisinopril (PRINIVIL,ZESTRIL) 5 MG tablet Take 1 tablet (5 mg total) by mouth daily.  30 tablet  0  . metFORMIN (GLUCOPHAGE) 500 MG tablet Take 1 tablet (500 mg total) by mouth 2 (two) times daily with a meal.  180 tablet  3    Objective: BP 144/103  Pulse 88  Temp(Src) 97.9 F (36.6 C) (Oral)  Resp 18  Wt 204 lb (92.534 kg)  SpO2 97%  LMP 12/12/2013 Exam: General: WDWN female sitting in bed in NAD, soft spoken, pleasant HEENT: NCAT, anicteric sclerae, PERRL, oropharynx clear Cardiovascular: RRR, no murmur, no JVD Respiratory: Nonlabored, CTAB Abdomen: +BS, soft, NT, ND, no guarding Extremities: WWP, no edema, 2+ DP and radial pulses Skin: 1cm x 1cm subcutaneous  induration without overlying erythema tender to palpation Neuro: Alert and oriented, strength symmetric, 5/5  Labs and Imaging: CBC BMET   Recent Labs Lab 12/23/13 1414 12/23/13 1521  WBC 7.1  --   HGB 13.5 15.6*  HCT 39.9 46.0  PLT 304  --     Recent Labs Lab 12/23/13 1414 12/23/13 1521  NA 126* 128*  K 4.4 4.3  CL 88* 97  CO2 14*  --   BUN 13 13  CREATININE 0.78 0.80  GLUCOSE 835* >700*  CALCIUM 8.2*  --       12/23/2013 16:55  Color, Urine YELLOW  APPearance CLEAR  Specific Gravity, Urine 1.035 (H)  pH 5.0  Glucose >1000 (A)  Bilirubin Urine NEGATIVE  Ketones, ur >80 (A)  Protein NEGATIVE  Urobilinogen, UA 0.2  Nitrite NEGATIVE  Leukocytes, UA NEGATIVE  Hgb urine dipstick LARGE (A)   Urine-Other AMORPHOUS URATES/PHOSPHATES  WBC, UA 0-2  RBC / HPF 0-2  Squamous Epithelial / LPF RARE  Bacteria, UA RARE  Pt currently experiencing menses   12/23/2013 17:00  Preg Test, Ur NEGATIVE    12/23/2013 14:14  Acetone, Bld SMALL (A)    12/23/2013 15:22  Sample type VENOUS  pH, Ven 7.308 (H)  pCO2, Ven 29.5 (L)  pO2, Ven 72.0 (H)  Bicarbonate 14.8 (L)  TCO2 16  Acid-base deficit 10.0 (H)  O2 Saturation 93.0  Patient temperature 98.3 F    Hazeline Junker, MD 12/23/2013, 6:32 PM PGY-1, [place] [facility] FPTS Intern pager: [phone], text pages welcome   Patient seen, examined. Available data reviewed. Agree with findings, assessment, and plan as outlined by Dr. Jarvis Newcomer.  My additional findings are documented and highlighted above.  Marena Chancy, PGY-3 Family Medicine Resident

## 2013-12-23 NOTE — ED Notes (Signed)
Pt's CBG is 377 mg/dl.RN notified.

## 2013-12-23 NOTE — ED Notes (Signed)
  CBG HI 

## 2013-12-23 NOTE — ED Provider Notes (Signed)
CSN: [id number]     Arrival date & time 12/23/13  1346 History   First MD Initiated Contact with Patient 12/23/13 1417     Chief Complaint  Patient presents with  . Hyperglycemia   (Consider location/radiation/quality/duration/timing/severity/associated sxs/prior Treatment) HPI Rebecca Rhodes is a 33 y.o. female who presents to ED with complaint of generalized malaise, nausea, polyuria, polydipsia, weakness. States she is type 2 diabetic. States that in December her physician switched her from long acting insulin 2 metformin. She states she still takes Novolin, and she injects 26 units into the skin daily, however has not taken any in 1 week. She states she has not been back to her Dr. since they switched her off of the long acting insulin and states she only checks her blood sugar approximately once every 2 months. She says she last checked her blood sugar month and a half ago and states it was good. She states she ran out of her insulin a week ago and has not been taking it, however she continues to take metformin. She then admitted that she did not take metformin today either. She states she's not taking her blood sugars because she is "forgetful." States in the last 2 days she has had generalized malaise, weakness, polyuria, polydipsia. States that today she developed nausea as well and decided to come to emergency department. Patient denies any vomiting, abdominal pain, diarrhea. She denies any recent illnesses. She denies any other symptoms.    Past Medical History  Diagnosis Date  . HTN (hypertension) 06/04/2012  . Hyperlipidemia 06/04/2012  . Diabetes mellitus 06/04/2012  . HSV-2 infection   . DKA (diabetic ketoacidoses)   . Acute respiratory failure    Past Surgical History  Procedure Laterality Date  . Irrigation and debridement abscess  10/31/2012    Procedure: IRRIGATION AND DEBRIDEMENT ABSCESS;  Surgeon: Cherylynn Ridges, MD;  Location: [facility] OR;  Service: General;  Laterality: Bilateral;   . Cesarean section N/A 08/08/2013    Procedure: CESAREAN SECTION;  Surgeon: Tereso Newcomer, MD;  Location: [facility] ORS;  Service: Obstetrics;  Laterality: N/A;   Family History  Problem Relation Age of Onset  . Hypertension Mother   . Stroke Mother   . Heart disease Father   . Diabetes Father   . Cancer Maternal Grandfather   . Hypertension Brother   . Diabetes Paternal Aunt   . Diabetes Paternal Uncle   . Diabetes Paternal Grandmother   . Diabetes Paternal Grandfather    History  Substance Use Topics  . Smoking status: Never Smoker   . Smokeless tobacco: Never Used  . Alcohol Use: No   OB History   Grav Para Term Preterm Abortions TAB SAB Ect Mult Living   1 1  1      1      Review of Systems  Constitutional: Positive for fatigue. Negative for fever and chills.  Respiratory: Negative for cough, chest tightness and shortness of breath.   Cardiovascular: Negative for chest pain, palpitations and leg swelling.  Gastrointestinal: Positive for nausea. Negative for vomiting, abdominal pain and diarrhea.  Endocrine: Positive for polydipsia and polyuria.  Genitourinary: Negative for dysuria, flank pain, vaginal bleeding, vaginal discharge, vaginal pain and pelvic pain.  Musculoskeletal: Negative for arthralgias, myalgias, neck pain and neck stiffness.  Skin: Negative for rash.  Neurological: Positive for weakness. Negative for dizziness and headaches.  All other systems reviewed and are negative.    Allergies  Penicillins  Home Medications   Current Outpatient  Rx  Name  Route  Sig  Dispense  Refill  . insulin regular (NOVOLIN R,HUMULIN R) 100 units/mL injection   Subcutaneous   Inject 26 Units into the skin daily before breakfast.          . lisinopril (PRINIVIL,ZESTRIL) 5 MG tablet   Oral   Take 1 tablet (5 mg total) by mouth daily.   30 tablet   0   . metFORMIN (GLUCOPHAGE) 500 MG tablet   Oral   Take 1 tablet (500 mg total) by mouth 2 (two) times daily with a  meal.   180 tablet   3    BP 167/109  Pulse 95  Temp(Src) 97.9 F (36.6 C) (Oral)  Resp 18  Wt 204 lb (92.534 kg)  SpO2 97%  LMP 12/12/2013 Physical Exam  Nursing note and vitals reviewed. Constitutional: She appears well-developed and well-nourished. No distress.  HENT:  Head: Normocephalic.  Eyes: Conjunctivae are normal.  Neck: Neck supple.  Cardiovascular: Normal rate, regular rhythm and normal heart sounds.   Pulmonary/Chest: Effort normal and breath sounds normal. No respiratory distress. She has no wheezes. She has no rales.  Abdominal: Soft. Bowel sounds are normal. She exhibits no distension. There is no tenderness. There is no rebound.  Musculoskeletal: She exhibits no edema.  Neurological: She is alert.  Skin: Skin is warm and dry.  Psychiatric: She has a normal mood and affect. Her behavior is normal.    ED Course  Procedures (including critical care time) Labs Review Labs Reviewed  COMPREHENSIVE METABOLIC PANEL - Abnormal; Notable for the following:    Sodium 126 (*)    Chloride 88 (*)    CO2 14 (*)    Glucose, Bld 835 (*)    Calcium 8.2 (*)    All other components within normal limits  GLUCOSE, CAPILLARY - Abnormal; Notable for the following:    Glucose-Capillary >600 (*)    All other components within normal limits  KETONES, QUALITATIVE - Abnormal; Notable for the following:    Acetone, Bld SMALL (*)    All other components within normal limits  POCT I-STAT, CHEM 8 - Abnormal; Notable for the following:    Sodium 128 (*)    Glucose, Bld >700 (*)    Hemoglobin 15.6 (*)    All other components within normal limits  POCT I-STAT 3, BLOOD GAS (G3P V) - Abnormal; Notable for the following:    pH, Ven 7.308 (*)    pCO2, Ven 29.5 (*)    pO2, Ven 72.0 (*)    Bicarbonate 14.8 (*)    Acid-base deficit 10.0 (*)    All other components within normal limits  CBC  URINALYSIS, ROUTINE W REFLEX MICROSCOPIC   Imaging Review No results found.  EKG  Interpretation   None       MDM   1. DKA (diabetic ketoacidoses)    Patient seen and examined, her initial CBG is greater than 700. Will start IV fluids, labs already ordered, will start on glucose stabilizer.  4:32 PM Glucose 835, anion gap 24. Pt on glucose stabilizer. Will continue to monitor.   4:43 PM Spoke with family practice, will admit. Holding orders placed. Pt continues to be stable, VS normal except for mild htn with BP of 147/98. Pt has history of htn.   Filed Vitals:   12/23/13 1408 12/23/13 1600  BP: 167/109 147/98  Pulse: 95 81  Temp: 97.9 F (36.6 C)   TempSrc: Oral   Resp: 18  Weight: 204 lb (92.534 kg)   SpO2: 97% 98%     Lottie Musselatyana A Thimothy Barretta, PA-C 12/23/13 1643

## 2013-12-23 NOTE — ED Notes (Signed)
Pt arrives stating she thinks her blood sugar is high. States her doctor recently switched her from sq insulin to oral meds. Pt reports polydipsia, polyuria. Pt alert, oriented x4, NAD at present.

## 2013-12-24 ENCOUNTER — Encounter (HOSPITAL_COMMUNITY): Payer: Self-pay | Admitting: *Deleted

## 2013-12-24 DIAGNOSIS — IMO0002 Reserved for concepts with insufficient information to code with codable children: Secondary | ICD-10-CM

## 2013-12-24 DIAGNOSIS — E1110 Type 2 diabetes mellitus with ketoacidosis without coma: Secondary | ICD-10-CM

## 2013-12-24 DIAGNOSIS — I10 Essential (primary) hypertension: Secondary | ICD-10-CM

## 2013-12-24 DIAGNOSIS — B009 Herpesviral infection, unspecified: Secondary | ICD-10-CM

## 2013-12-24 DIAGNOSIS — E119 Type 2 diabetes mellitus without complications: Secondary | ICD-10-CM

## 2013-12-24 LAB — BASIC METABOLIC PANEL
BUN: 8 mg/dL (ref 6–23)
BUN: 7 mg/dL (ref 6–23)
BUN: 6 mg/dL (ref 6–23)
BUN: 6 mg/dL (ref 6–23)
BUN: 5 mg/dL — AB (ref 6–23)
BUN: 5 mg/dL — ABNORMAL LOW (ref 6–23)
BUN: 11 mg/dL (ref 6–23)
CALCIUM: 7.4 mg/dL — AB (ref 8.4–10.5)
CALCIUM: 8.6 mg/dL (ref 8.4–10.5)
CHLORIDE: 104 meq/L (ref 96–112)
CHLORIDE: 105 meq/L (ref 96–112)
CHLORIDE: 104 meq/L (ref 96–112)
CHLORIDE: 104 meq/L (ref 96–112)
CHLORIDE: 101 meq/L (ref 96–112)
CO2: 20 mEq/L (ref 19–32)
CO2: 19 meq/L (ref 19–32)
CO2: 19 meq/L (ref 19–32)
CO2: 21 mEq/L (ref 19–32)
CO2: 21 meq/L (ref 19–32)
CO2: 21 meq/L (ref 19–32)
CO2: 19 meq/L (ref 19–32)
CREATININE: 0.57 mg/dL (ref 0.50–1.10)
CREATININE: 0.54 mg/dL (ref 0.50–1.10)
CREATININE: 0.69 mg/dL (ref 0.50–1.10)
Calcium: 8.2 mg/dL — ABNORMAL LOW (ref 8.4–10.5)
Calcium: 8.1 mg/dL — ABNORMAL LOW (ref 8.4–10.5)
Calcium: 7.9 mg/dL — ABNORMAL LOW (ref 8.4–10.5)
Calcium: 7.9 mg/dL — ABNORMAL LOW (ref 8.4–10.5)
Calcium: 8.1 mg/dL — ABNORMAL LOW (ref 8.4–10.5)
Chloride: 104 mEq/L (ref 96–112)
Chloride: 101 mEq/L (ref 96–112)
Creatinine, Ser: 0.63 mg/dL (ref 0.50–1.10)
Creatinine, Ser: 0.56 mg/dL (ref 0.50–1.10)
Creatinine, Ser: 0.56 mg/dL (ref 0.50–1.10)
Creatinine, Ser: 0.83 mg/dL (ref 0.50–1.10)
GFR calc Af Amer: >90 mL/min (ref >90)
GFR calc Af Amer: >90 mL/min (ref >90)
GFR calc Af Amer: >90 mL/min (ref >90)
GFR calc Af Amer: >90 mL/min (ref >90)
GFR calc non Af Amer: >90 mL/min (ref >90)
GFR calc non Af Amer: >90 mL/min (ref >90)
GFR calc non Af Amer: >90 mL/min (ref >90)
GFR calc non Af Amer: >90 mL/min (ref >90)
GFR calc non Af Amer: >90 mL/min (ref >90)
GFR calc non Af Amer: >90 mL/min (ref >90)
GLUCOSE: 158 mg/dL — AB (ref 70–99)
GLUCOSE: 378 mg/dL — AB (ref 70–99)
Glucose, Bld: 148 mg/dL — ABNORMAL HIGH (ref 70–99)
Glucose, Bld: 308 mg/dL — ABNORMAL HIGH (ref 70–99)
Glucose, Bld: 180 mg/dL — ABNORMAL HIGH (ref 70–99)
Glucose, Bld: 154 mg/dL — ABNORMAL HIGH (ref 70–99)
Glucose, Bld: 235 mg/dL — ABNORMAL HIGH (ref 70–99)
POTASSIUM: 3.8 meq/L (ref 3.7–5.3)
POTASSIUM: 3.2 meq/L — AB (ref 3.7–5.3)
Potassium: 3.6 mEq/L — ABNORMAL LOW (ref 3.7–5.3)
Potassium: 3.9 mEq/L (ref 3.7–5.3)
Potassium: 4.1 mEq/L (ref 3.7–5.3)
Potassium: 4.6 mEq/L (ref 3.7–5.3)
Potassium: 4.5 mEq/L (ref 3.7–5.3)
SODIUM: 135 meq/L — AB (ref 137–147)
Sodium: 137 mEq/L (ref 137–147)
Sodium: 138 mEq/L (ref 137–147)
Sodium: 137 mEq/L (ref 137–147)
Sodium: 136 mEq/L — ABNORMAL LOW (ref 137–147)
Sodium: 134 mEq/L — ABNORMAL LOW (ref 137–147)
Sodium: 131 mEq/L — ABNORMAL LOW (ref 137–147)

## 2013-12-24 LAB — TROPONIN I
Troponin I: <0.3 ng/mL (ref <0.30)
Troponin I: <0.3 ng/mL (ref <0.30)

## 2013-12-24 LAB — GLUCOSE, CAPILLARY
GLUCOSE-CAPILLARY: 148 mg/dL — AB (ref 70–99)
GLUCOSE-CAPILLARY: 200 mg/dL — AB (ref 70–99)
GLUCOSE-CAPILLARY: 382 mg/dL — AB (ref 70–99)
Glucose-Capillary: 114 mg/dL — ABNORMAL HIGH (ref 70–99)
Glucose-Capillary: 142 mg/dL — ABNORMAL HIGH (ref 70–99)
Glucose-Capillary: 142 mg/dL — ABNORMAL HIGH (ref 70–99)
Glucose-Capillary: 147 mg/dL — ABNORMAL HIGH (ref 70–99)
Glucose-Capillary: 205 mg/dL — ABNORMAL HIGH (ref 70–99)
Glucose-Capillary: 296 mg/dL — ABNORMAL HIGH (ref 70–99)
Glucose-Capillary: 163 mg/dL — ABNORMAL HIGH (ref 70–99)
Glucose-Capillary: 150 mg/dL — ABNORMAL HIGH (ref 70–99)
Glucose-Capillary: 171 mg/dL — ABNORMAL HIGH (ref 70–99)
Glucose-Capillary: 261 mg/dL — ABNORMAL HIGH (ref 70–99)
Glucose-Capillary: 339 mg/dL — ABNORMAL HIGH (ref 70–99)
Glucose-Capillary: 347 mg/dL — ABNORMAL HIGH (ref 70–99)

## 2013-12-24 LAB — HEMOGLOBIN A1C
Hgb A1c MFr Bld: 14.9 % — ABNORMAL HIGH (ref <5.7)
Mean Plasma Glucose: 381 mg/dL — ABNORMAL HIGH (ref <117)

## 2013-12-24 LAB — PROTEIN / CREATININE RATIO, URINE
CREATININE, URINE: 43.83 mg/dL
Protein Creatinine Ratio: 0.09 (ref 0.00–0.15)
TOTAL PROTEIN, URINE: 4 mg/dL

## 2013-12-24 LAB — MRSA PCR SCREENING: MRSA by PCR: POSITIVE — AB

## 2013-12-24 MED ORDER — POTASSIUM CHLORIDE CRYS ER 20 MEQ PO TBCR
30.0000 meq | EXTENDED_RELEASE_TABLET | Freq: Three times a day (TID) | ORAL | Status: AC
  Administered 2013-12-24 (×3): 30 meq via ORAL
  Filled 2013-12-24 (×3): qty 1

## 2013-12-24 MED ORDER — POTASSIUM CHLORIDE CRYS ER 20 MEQ PO TBCR
30.0000 meq | EXTENDED_RELEASE_TABLET | Freq: Three times a day (TID) | ORAL | Status: DC
  Filled 2013-12-24 (×3): qty 1

## 2013-12-24 MED ORDER — INSULIN ASPART PROT & ASPART (70-30 MIX) 100 UNIT/ML ~~LOC~~ SUSP
20.0000 [IU] | Freq: Every day | SUBCUTANEOUS | Status: DC
  Administered 2013-12-24 – 2013-12-25 (×2): 20 [IU] via SUBCUTANEOUS
  Filled 2013-12-24 (×2): qty 10

## 2013-12-24 MED ORDER — INSULIN ASPART 100 UNIT/ML ~~LOC~~ SOLN
0.0000 [IU] | SUBCUTANEOUS | Status: DC
  Administered 2013-12-24: 4 [IU] via SUBCUTANEOUS
  Administered 2013-12-24: 15 [IU] via SUBCUTANEOUS
  Administered 2013-12-24: 20 [IU] via SUBCUTANEOUS
  Administered 2013-12-25 (×2): 4 [IU] via SUBCUTANEOUS

## 2013-12-24 MED ORDER — INSULIN GLARGINE 100 UNIT/ML ~~LOC~~ SOLN: 10.0000 [IU] | Freq: Every day | SUBCUTANEOUS | Status: DC

## 2013-12-24 MED ORDER — INSULIN ASPART PROT & ASPART (70-30 MIX) 100 UNIT/ML ~~LOC~~ SUSP
10.0000 [IU] | Freq: Every day | SUBCUTANEOUS | Status: DC
  Filled 2013-12-24: qty 10

## 2013-12-24 MED ORDER — POTASSIUM CHLORIDE CRYS ER 20 MEQ PO TBCR
60.0000 meq | EXTENDED_RELEASE_TABLET | Freq: Once | ORAL | Status: AC
  Administered 2013-12-24: 60 meq via ORAL
  Filled 2013-12-24: qty 3

## 2013-12-24 MED ORDER — INSULIN ASPART PROT & ASPART (70-30 MIX) 100 UNIT/ML ~~LOC~~ SUSP
20.0000 [IU] | Freq: Every day | SUBCUTANEOUS | Status: DC
Start: 2013-12-25 — End: 2013-12-24
  Filled 2013-12-24: qty 10

## 2013-12-24 MED ORDER — INSULIN ASPART PROT & ASPART (70-30 MIX) 100 UNIT/ML ~~LOC~~ SUSP
30.0000 [IU] | Freq: Every day | SUBCUTANEOUS | Status: DC
  Filled 2013-12-24: qty 10

## 2013-12-24 MED ORDER — SODIUM CHLORIDE 0.9 % IV BOLUS (SEPSIS): 1000.0000 mL | Freq: Once | INTRAVENOUS | Status: DC

## 2013-12-24 MED ORDER — INSULIN GLARGINE 100 UNIT/ML ~~LOC~~ SOLN
30.0000 [IU] | Freq: Every day | SUBCUTANEOUS | Status: DC
  Administered 2013-12-24: 30 [IU] via SUBCUTANEOUS
  Filled 2013-12-24: qty 0.3

## 2013-12-24 MED ORDER — LISINOPRIL 10 MG PO TABS
10.0000 mg | ORAL_TABLET | Freq: Every day | ORAL | Status: DC
  Administered 2013-12-25: 10 mg via ORAL
  Filled 2013-12-24: qty 1

## 2013-12-24 MED ORDER — INSULIN ASPART 100 UNIT/ML ~~LOC~~ SOLN
5.0000 [IU] | Freq: Once | SUBCUTANEOUS | Status: AC
  Administered 2013-12-24: 5 [IU] via SUBCUTANEOUS

## 2013-12-24 NOTE — Care Management Note (Unsigned)
    Page 1 of 2   12/26/2013     1:20:47 PM   CARE MANAGEMENT NOTE 12/26/2013  Patient:  Rebecca Rhodes,[name]   Account Number:  [id number]  Date Initiated:  12/24/2013  Documentation initiated by:  Donn PieriniWEBSTER,KRISTI  Subjective/Objective Assessment:   Pt admitted with DKA     Action/Plan:   PTA pt lived at home with boyfriend   Anticipated DC Date:  12/26/2013   Anticipated DC Plan:  HOME/SELF CARE      DC Planning Services  CM consult  Medication Assistance  MATCH Program      Choice offered to / List presented to:             Status of service:  Completed, signed off Medicare Important Message given?   (If response is "NO", the following Medicare IM given date fields will be blank) Date Medicare IM given:   Date Additional Medicare IM given:    Discharge Disposition:    Per UR Regulation:  Reviewed for med. necessity/level of care/duration of stay  If discussed at Long Length of Stay Meetings, dates discussed:    Comments:  12/26/2013 12:09pm Disposition Plan:  CM will review d/c med list to determine any meds not on $4.00 list for possible MATCH letter needs. CM requested MD use meds from $4.00 list if possible d/t Self-pay status. D/c 12/26/2013 Crystal Hutchinson RN, BSN, MSHL, CCM 12/26/2013  12/24/13- 1400- Donn PieriniKristi Webster RN, BSN [phone] Referral for medication needs- spoke with pt at bedside- per conversation pt states that she lives at home with boyfriend- and will have transportation home. She reports [facility] and plans to f/u with them on discharge- per MD notes they will assist pt with [facility] for medications. Pt states that she was having difficulty affording her meds. Pt would be eligible for the [facility] program for any meds not on the $4 list- explained to pt how the program works and about the $3 copay per prescription- CM will give pt MATCH letter at discharge if needed. - pt voiced understanding and is agreeable.

## 2013-12-24 NOTE — Progress Notes (Signed)
Family Medicine PCP Social Progress Note  I stopped by to see Rebecca Rhodes today. She says she is doing pretty good overall, no complaints at the current time. Discussed that hopefully the next time she runs out of insulin she contacts our clinic to try and see if there is anything we can do for her. I appreciate the excellent care [facility] been providing my patient.  Tawni CarnesAndrew Tomer Chalmers, MD 12/24/2013, 5:10 PM PGY-1, [facility]

## 2013-12-24 NOTE — Progress Notes (Signed)
Family Medicine Teaching Service Daily Progress Note Intern Pager: [phone]  Patient name: Rebecca Rhodes Medical record number: [id number] Date of birth: 10-Aug-1981 Age: 33 y.o. Gender: female  Primary Care Provider: Tawni Carnes, MD Consultants: None Code Status: Full  Pt Overview and Major Events to Date:  1/12: Admitted in DKA 1/13: Taken off insulin drip  Assessment and Plan: Rebecca Rhodes is a 33 y.o. female presenting with polyuria/dipsia after running out of insulin 1 week ago, found to be in DKA. PMH is significant for T2DM and history of poor medication adherence, HTN, and hyperlipidemia.   Diabetic ketoacidosis: On arrival: VBG: pH 7.30, bicarb: 14, glucose: 835, AG: 24. +ketones in serum and urine. No S/Sxs infection. - Good control on glucommander. - Lantus 30u given at 6:51; transition off insulin gtt now, carb modified diet - CBG q1h x4 then q4h - Continue D5 1/2NS w/KCl  - Hold metformin and regular insulin. Will need to consider discharge insulin/oral regimen in light of limited finances.  - Hb A1c: 14.9%  Metabolic derangements Improved and stable - Will supplement K orally and in IVF as needed (currently 3.9)  HTN: Per EMR, has previously been on HCTZ, lisinopril, and amlodipine.  - resume home lisinopril 5mg  daily   Social/financial limitations: Source of medical non-adherence, which is primary cause of this admission.  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  Subjective: Pt slept ok, denies N/V/D/abd pain. Not very thirsty or hungry. Denies CP/shortness of breath  Objective: Temp:  [97.8 F (36.6 C)-99 F (37.2 C)] 97.8 F (36.6 C) (01/13  0827) Pulse Rate:  [81-106] 85 (01/13 0827) Resp:  [17-21] 18 (01/13 0827) BP: (131-167)/(81-109) 131/81 mmHg (01/13 0827) SpO2:  [95 %-100 %] 98 % (01/13 0827) Weight:  [204 lb (92.534 kg)-223 lb 1.7 oz (101.2 kg)] 223 lb 1.7 oz (101.2 kg) (01/12 2350) Physical Exam: General: WDWN, pleasant female laying in bed in NAD Cardiovascular: RRR, no murmur, no JVD  Respiratory: Nonlabored, CTAB  Abdomen: +BS, soft, NT, ND, no guarding  Extremities: WWP, no edema, 2+ DP and radial pulses  Skin: 1cm x 1cm subcutaneous induration without overlying erythema tender to palpation  Neuro: Alert and oriented, strength symmetric, 5/5  Laboratory:  Recent Labs Lab 12/23/13 1414 12/23/13 1521  WBC 7.1  --   HGB 13.5 15.6*  HCT 39.9 46.0  PLT 304  --     Recent Labs Lab 12/23/13 1414  12/24/13 0443 12/24/13 0615 12/24/13 0813  NA 126*  < > 138 135* 137  K 4.4  < > 3.6* 3.2* 3.9  CL 88*  < > 105 104 104  CO2 14*  < > 19 19 21   BUN 13  < > 7 6 6   CREATININE 0.78  < > 0.57 0.56 0.54  CALCIUM 8.2*  < > 8.1* 7.4* 7.9*  PROT 7.8  --   --   --   --   BILITOT 0.3  --   --   --   --   ALKPHOS 106  --   --   --   --   ALT <5  --   --   --   --   AST 11  --   --   --   --  GLUCOSE 835*  < > 158* 308* 180*  < > = values in this interval not displayed.  12/23/2013 16:55   Color, Urine  YELLOW   APPearance  CLEAR   Specific Gravity, Urine  1.035 (H)   pH  5.0   Glucose  >1000 (A)   Bilirubin Urine  NEGATIVE   Ketones, ur  >80 (A)   Protein  NEGATIVE   Urobilinogen, UA  0.2   Nitrite  NEGATIVE   Leukocytes, UA  NEGATIVE   Hgb urine dipstick  LARGE (A)   Urine-Other  AMORPHOUS URATES/PHOSPHATES   WBC, UA  0-2   RBC / HPF  0-2   Squamous Epithelial / LPF  RARE   Bacteria, UA  RARE   Pt currently experiencing menses   12/23/2013 17:00   Preg Test, Ur  NEGATIVE     12/23/2013 14:14   Acetone, Bld  SMALL (A)     12/23/2013 15:22   Sample type  VENOUS   pH, Ven  7.308 (H)   pCO2, Ven   29.5 (L)   pO2, Ven  72.0 (H)   Bicarbonate  14.8 (L)   TCO2  16   Acid-base deficit  10.0 (H)   O2 Saturation  93.0   Patient temperature  98.3 F    Hazeline Junkeryan Sriyan Cutting, MD 12/24/2013, 8:51 AM PGY-1, [place] [facility] FPTS Intern pager: [phone], text pages welcome

## 2013-12-24 NOTE — Progress Notes (Signed)
Utilization review completed.  

## 2013-12-24 NOTE — H&P (Signed)
Attending Addendum  I examined the patient and discussed the assessment and plan with Dr. Gwenlyn SaranLosq. I have reviewed the note and agree.     Briefly, 33 yo F with DM admitted for hyperglycemia and acidemia in the setting of non compliance. She also has HTN. No evidence of acute infectious or inflammatory process. No evidence of MI. Today patient reports fatigue but feeling well overall. She had a short episode of SOB prior to admission.   Of note, she has a 244 month old baby girl at home. She reports compliance with insulin therapy during her pregnancy.   BP 131/81  Pulse 85  Temp(Src) 97.8 F (36.6 C) (Oral)  Resp 18  Ht 5\' 4"  (1.626 m)  Wt 223 lb 1.7 oz (101.2 kg)  BMI 38.28 kg/m2  SpO2 98%  LMP 12/12/2013 General appearance: alert, cooperative and no distress Head: Normocephalic, without obvious abnormality, atraumatic Mouth: MMM Lungs: clear to auscultation bilaterally Heart: regular rate and rhythm, S1, S2 normal, no murmur, click, rub or gallop Extremities: 1.5 x 1.5 cm boil L lateral leg. no erythema or drainage. TTP.   CBG (last 3)   Recent Labs  12/24/13 0749 12/24/13 0853 12/24/13 1012  GLUCAP 163* 150* 171*    Lab Results  Component Value Date   HGBA1C 14.9* 12/23/2013   EKG: NSR, ST elevation in anterior leads V2 and V3 (unchaged from previous reading).  Trop: neg x 1   A/P 33 yo F with uncontrolled DM admitted for hyperglycemia and acidemia in the setting of medication non compliance.  I agree with gluco stabilizer until AG closed and acidemia resolved. Additional NS bolus ordered, 3 L  Total. Agree with SW/CM consult for medication assistance.  Patient should also receive info about [place] Medication assistance program.   Dessa PhiFUNCHES,Janira Mandell, MD FAMILY MEDICINE TEACHING SERVICE

## 2013-12-24 NOTE — Progress Notes (Signed)
Spoke with Dr. Berline Choughigby regarding patient's blood sugar of 261 and blood pressure of 157/111. Pt alert, oriented, and in no pain or discomfort per patient's report.  New orders received. Will continue to monitor.  - Alwyn RenJamie Johnny Latu, RN

## 2013-12-24 NOTE — Progress Notes (Addendum)
FMTS Attending Note  The plan of care was discussed with the resident team. I agree with the assessment and plan as documented by the resident.   Jaylan Hinojosa MD 

## 2013-12-24 NOTE — Progress Notes (Signed)
Inpatient Diabetes Program Recommendations  AACE/ADA: New Consensus Statement on Inpatient Glycemic Control (2013)  Target Ranges:  Prepandial:   less than 140 mg/dL      Peak postprandial:   less than 180 mg/dL (1-2 hours)      Critically ill patients:  140 - 180 mg/dL     Results for Rebecca Rhodes, Raffaella (MRN [id number]) as of 12/24/2013 12:21  Ref. Range 12/23/2013 14:14  Glucose Latest Range: 70-99 mg/dL [id number] [facility](HH)    Results for Rebecca Rhodes, Rebecca Rhodes (MRN [id number]) as of 12/24/2013 12:21  Ref. Range 12/23/2013 19:49  Hemoglobin A1C Latest Range: <5.7 % 14.9 (H)    **Patient admitted with glucose of 835 mg/dl.  "Ran out of insulin 1 week ago" per MD H&P note.  Received NS in ED and placed on IV insulin drip.  Transitioned off IV insulin drip this morning (patient received 30 units Lantus around 7am today).   **Patient known to the Inpatient Diabetes Program.  Per chart review, patient was seen in person by the Diabetes Coordinator back in June of 2013.  During that admission, patient admitted to noncompliance with her DM medication regimen due to inability to purchase her medications.  Patient was discharged home on Lantus and Regular SSI from this admission.  Patient was readmitted in November of that same year (2013) with DKA and stated again she stopped taking her insulin b/c she could not afford her insulin.  Patient was discharged from that hospitalization in November with Rxs for 70/30 insulin 30 units with supper and Regular SSI.  [facility] by Dr. Standley Dakinslanford Johnson on 11/29/013 for follow up and her insulin prescription was adjusted to 70/30 insulin 30 units bid with breakfast and supper and Regular SSI.  After this visit, patient became pregnant and was seen numerous times in the high risk pregnancy clinic and was managed with NPH insulin and Regular insulin (SSI) during the duration of her pregnancy.  After patient delivered her baby, [facility]  [facility].  [facility] 09/[facility]/13 by the NP during a postpartum visit and was seen by MD in the [facility] clinic on 10/22/13.  Per that visit, patient was supposed to restart Metformin and Glipizide, however, it looks as if she did not comply with these physician instructions.    **Unsure why patient would not purchase her Metformin and Glipizide.  Both of these medications can be purchased at [facility] from the generic list for $4 per script.  [facility] also offers reasonably priced insulins.  [facility] offers Reli-on brands of the following insulin for $25 per vial: 70/30 insulin, NPH insulin, Regular insulin.  [facility] also has a CBG meter called the Reli-On Prime blood glucose meter that can be purchased OTC for $16 and a box of 50 strips for $9.   **Noted patient is currently receiving Lantus and Novolog here in hospital.  Will need to be switched to more affordable insulin for outpatient use.  Most simple and straightforward regimen would be 70/30 insulin bid with breakfast and supper.   Will follow and assist if desired. Ambrose FinlandJeannine Johnston Telecia Larocque RN, MSN, CDE Diabetes Coordinator Inpatient Diabetes Program Team Pager: [phone] (8a-10p)

## 2013-12-25 DIAGNOSIS — L02429 Furuncle of limb, unspecified: Secondary | ICD-10-CM

## 2013-12-25 DIAGNOSIS — O10019 Pre-existing essential hypertension complicating pregnancy, unspecified trimester: Secondary | ICD-10-CM

## 2013-12-25 LAB — GLUCOSE, CAPILLARY
GLUCOSE-CAPILLARY: 348 mg/dL — AB (ref 70–99)
Glucose-Capillary: 101 mg/dL — ABNORMAL HIGH (ref 70–99)
Glucose-Capillary: 199 mg/dL — ABNORMAL HIGH (ref 70–99)
Glucose-Capillary: 192 mg/dL — ABNORMAL HIGH (ref 70–99)
Glucose-Capillary: 297 mg/dL — ABNORMAL HIGH (ref 70–99)
Glucose-Capillary: 137 mg/dL — ABNORMAL HIGH (ref 70–99)

## 2013-12-25 MED ORDER — INSULIN ASPART PROT & ASPART (70-30 MIX) 100 UNIT/ML ~~LOC~~ SUSP
20.0000 [IU] | Freq: Once | SUBCUTANEOUS | Status: DC
  Filled 2013-12-25: qty 10

## 2013-12-25 MED ORDER — LISINOPRIL 20 MG PO TABS
20.0000 mg | ORAL_TABLET | Freq: Every day | ORAL | Status: DC
  Administered 2013-12-26: 20 mg via ORAL
  Filled 2013-12-25: qty 1

## 2013-12-25 MED ORDER — SULFAMETHOXAZOLE-TMP DS 800-160 MG PO TABS
1.0000 | ORAL_TABLET | Freq: Two times a day (BID) | ORAL | Status: DC
  Administered 2013-12-25 – 2013-12-26 (×3): 1 via ORAL
  Filled 2013-12-25 (×5): qty 1

## 2013-12-25 MED ORDER — INSULIN ASPART PROT & ASPART (70-30 MIX) 100 UNIT/ML ~~LOC~~ SUSP
40.0000 [IU] | Freq: Every day | SUBCUTANEOUS | Status: DC
  Administered 2013-12-26: 08:00:00 40 [IU] via SUBCUTANEOUS
  Filled 2013-12-25: qty 10

## 2013-12-25 MED ORDER — INSULIN ASPART PROT & ASPART (70-30 MIX) 100 UNIT/ML ~~LOC~~ SUSP
30.0000 [IU] | Freq: Once | SUBCUTANEOUS | Status: AC
  Administered 2013-12-25: 30 [IU] via SUBCUTANEOUS

## 2013-12-25 NOTE — Progress Notes (Signed)
Family Medicine Teaching Service Daily Progress Note Intern Pager: [phone]  Patient name: Rebecca Rhodes Medical record number: [id number] Date of birth: 1981-09-20 Age: 34 y.o. Gender: female  Primary Care Provider: Tawni Carnes, MD Consultants: None Code Status: Full  Pt Overview and Major Events to Date:  1/12: Admitted in DKA 1/13: Taken off insulin drip, transitioned to 70/30 BID  1/14: Transfer to floor, Continued titration of insulin requirement  Assessment and Plan: Rebecca Rhodes is a 33 y.o. female presenting with polyuria/dipsia after running out of insulin 1 week ago, found to be in DKA. PMH is significant for T2DM and history of poor medication adherence, HTN, and hyperlipidemia.   Diabetic ketoacidosis: On arrival: VBG: pH 7.30, bicarb: 14, glucose: 835, AG: 24. +ketones in serum and urine. No S/Sxs infection. - Regimen now: 70/30 insulin BID (40u with breakfast, 20u with supper) - Fasting CBG this AM 101. 192 post prandial without AM 70/30 given yet - NS w/ KCl, carb modified diet  - Hb A1c: 14.9%  Metabolic derangements Improved and stable - On NS w/ KCl @ 125cc/hr for hyponatremia (131) and hypokalemia (corrected at 4.5)  HTN: Per EMR, has previously been on HCTZ, lisinopril, and amlodipine.  - Lisinopril 10mg  daily   Social/financial limitations: Source of medical non-adherence, which is primary cause of this admission.  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 Appetite has returned. Denies CP/shortness of breath.  Objective: Temp:  [97.5 F (36.4 C)-98.4 F (36.9 C)] 98 F (36.7 C) (01/14 0800) Pulse Rate:  [78-94] 78 (01/14 0800) Resp:  [20-26] 20 (01/14 0800) BP: (124-157)/(79-111) 131/93 mmHg (01/14 0800) SpO2:  [95 %-100 %] 99 % (01/14 0800) Physical Exam: General: WDWN, pleasant female laying in bed in NAD Cardiovascular: RRR, no murmur, no JVD  Respiratory: Nonlabored, CTAB  Abdomen: +BS, soft, NT, ND, no guarding  Extremities: WWP, no edema, 2+ DP and radial pulses  Skin: 1cm x 1cm subcutaneous induration without overlying erythema tender to palpation  Neuro: Alert and oriented, strength symmetric, 5/5  Laboratory:  Recent Labs Lab 12/23/13 1414 12/23/13 1521  WBC 7.1  --   HGB 13.5 15.6*  HCT 39.9 46.0  PLT 304  --     Recent Labs Lab 12/23/13 1414  12/24/13 0951 12/24/13 1410 12/24/13 2005  NA 126*  < > 136* 134* 131*  K 4.4  < > 4.1 4.6 4.5  CL 88*  < > 104 101 101  CO2 14*  < > 21 21 19   BUN 13  < > 5* 5* 11  CREATININE 0.78  < > 0.56 0.69 0.83  CALCIUM 8.2*  < > 7.9* 8.1* 8.6  PROT 7.8  --   --   --   --   BILITOT 0.3  --   --   --   --   ALKPHOS 106  --   --   --   --   ALT <5  --   --   --   --  AST 11  --   --   --   --   GLUCOSE 835*  < > 154* 235* 378*  < > = values in this interval not displayed.  12/23/2013 16:55   Color, Urine  YELLOW   APPearance  CLEAR   Specific Gravity, Urine  1.035 (H)   pH  5.0   Glucose  >1000 (A)   Bilirubin Urine  NEGATIVE   Ketones, ur  >80 (A)   Protein  NEGATIVE   Urobilinogen, UA  0.2   Nitrite  NEGATIVE   Leukocytes, UA  NEGATIVE   Hgb urine dipstick  LARGE (A)   Urine-Other  AMORPHOUS URATES/PHOSPHATES   WBC, UA  0-2   RBC / HPF  0-2   Squamous Epithelial / LPF  RARE   Bacteria, UA  RARE   Pt currently experiencing menses   12/23/2013 17:00   Preg Test, Ur  NEGATIVE     12/23/2013 14:14   Acetone, Bld  SMALL (A)      12/23/2013 15:22   Sample type  VENOUS   pH, Ven  7.308 (H)   pCO2, Ven  29.5 (L)   pO2, Ven  72.0 (H)   Bicarbonate  14.8 (L)   TCO2  16   Acid-base deficit  10.0 (H)   O2 Saturation  93.0   Patient temperature  98.3 F    Hazeline Junkeryan Jernie Schutt, MD 12/25/2013, 8:51 AM PGY-1, [facility] FPTS Intern pager: [phone], text pages welcome

## 2013-12-25 NOTE — Progress Notes (Signed)
FMTS Attending Note  I personally saw and evaluated the patient. The plan of care was discussed with the resident team. I agree with the assessment and plan as documented by the resident.   1. DKA - ketosis has resolved, anion gap closed 2. Type 2 Diabetes - patient initiated on 70/30 insulin 40 units in the Am and 20 units with supper, monitor glucoses to titrate dose 3. HTN controlled on Lisinpril 4. No health insurance/difficulty affording meds - SW and case management have met with patient to discuss medication options 5. Boil left lower extremity - not amenable to drainage at this time, warm compresses and Bactrim  Home tomorrow if glucoses remain controlled  Dossie Arbour MD

## 2013-12-25 NOTE — Progress Notes (Signed)
Spoke with attending about change in 70/30 mix and stop of SSI.  D/c of am 70/30 was dicussed, attending stated will cover lunch CBG with SSI and continue with 70/30 as planned.  Will continue to monitor.

## 2013-12-25 NOTE — Progress Notes (Signed)
Pt tx to new room with belongs. VSS.

## 2013-12-26 ENCOUNTER — Other Ambulatory Visit: Payer: Self-pay

## 2013-12-26 LAB — CBC
HCT: 41.4 % (ref 36.0–46.0)
Hemoglobin: 13.6 g/dL (ref 12.0–15.0)
MCH: 28.9 pg (ref 26.0–34.0)
MCHC: 32.9 g/dL (ref 30.0–36.0)
MCV: 88.1 fL (ref 78.0–100.0)
Platelets: 268 10*3/uL (ref 150–400)
RBC: 4.7 MIL/uL (ref 3.87–5.11)
RDW: 14 % (ref 11.5–15.5)
WBC: 5.6 10*3/uL (ref 4.0–10.5)

## 2013-12-26 LAB — BASIC METABOLIC PANEL
BUN: 7 mg/dL (ref 6–23)
CO2: 17 mEq/L — ABNORMAL LOW (ref 19–32)
Calcium: 8.5 mg/dL (ref 8.4–10.5)
Chloride: 103 mEq/L (ref 96–112)
Creatinine, Ser: 0.74 mg/dL (ref 0.50–1.10)
GFR calc Af Amer: >90 mL/min (ref >90)
Glucose, Bld: 251 mg/dL — ABNORMAL HIGH (ref 70–99)
Potassium: 4.5 mEq/L (ref 3.7–5.3)
SODIUM: 135 meq/L — AB (ref 137–147)

## 2013-12-26 LAB — GLUCOSE, CAPILLARY
GLUCOSE-CAPILLARY: 269 mg/dL — AB (ref 70–99)
Glucose-Capillary: 231 mg/dL — ABNORMAL HIGH (ref 70–99)

## 2013-12-26 MED ORDER — SULFAMETHOXAZOLE-TMP DS 800-160 MG PO TABS: 1.0000 | ORAL_TABLET | Freq: Two times a day (BID) | ORAL | Status: DC

## 2013-12-26 MED ORDER — LISINOPRIL 20 MG PO TABS: 20.0000 mg | ORAL_TABLET | Freq: Every day | ORAL | Status: DC

## 2013-12-26 MED ORDER — INSULIN ASPART PROT & ASPART (70-30 MIX) 100 UNIT/ML ~~LOC~~ SUSP: SUBCUTANEOUS | Status: DC

## 2013-12-26 MED ORDER — GLUCOSE BLOOD VI STRP: ORAL_STRIP | Status: DC

## 2013-12-26 NOTE — Discharge Summary (Signed)
Family Medicine Teaching [facility] Discharge Summary  Patient name: Rebecca Rhodes Medical record number: [id number] Date of birth: 09-Jul-1981 Age: 33 y.o. Gender: female Date of Admission: 12/23/2013  Date of Discharge: 12/26/2013 Admitting Physician: Uvaldo Rising, MD  Primary Care Provider: Tawni Carnes, MD Consultants: None  Indication for Hospitalization: DKA  Discharge Diagnoses/Problem List:  Patient Active Problem List   Diagnosis Date Noted  . Hyperglycemia 12/23/2013  . 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 Yeast infection involving the vagina and surrounding area 10/22/2013  . Boil, leg 06/24/2013  . 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 Benign essential hypertension antepartum 02/18/2013  . Acute respiratory failure 10/24/2012  . DKA (diabetic ketoacidoses) 06/04/2012  . HTN (hypertension) 06/04/2012  . Hyperlipidemia 06/04/2012  . Diabetes mellitus 06/04/2012   Disposition: Discharged home  Discharge Condition: Stable  Discharge Exam:  General: WDWN, pleasant female laying in bed in NAD  Cardiovascular: RRR, no murmur, no JVD  Respiratory: Nonlabored, CTAB  Abdomen: +BS, soft, NT, ND, no guarding  Extremities: WWP, no edema, 2+ DP and radial pulses  Skin: 1.5cm x 1.5cm subcutaneous induration without overlying erythema tender to palpation on left lateral calf  Neuro: Alert and oriented, strength symmetric, 5/5  Brief Hospital Course:   [name] is a 33 y.o. female presenting with polyuria/dipsia after running out of insulin 1 week ago, found to be in DKA. PMH is significant for T2DM and history of poor medication adherence, HTN, and hyperlipidemia.   Evaluation at admission was consistent with DKA. Glucose was 835 with an anion gap of 24 and ketones in serum and urine. There were no signs or symptoms of infection and poor medical adherence was the likely precipitant of this episode. Glocommander protocol was begun and the anion gap closed the  following day. Lantus insulin was given, the drip was discontinued, and the patient tolerated a carbohydrate modified diet. Due to concern for financial limitations a diabetes coordinator recommended insulin 70/30 ($25 per vial) and made further recommendations for inexpensive supplies including Reli-On CBG meter OTC for $16 and a box of 50 strips for $9 at [facility]. These were prescribed and the patient reported the ability to afford these supplies. Her hemoglobin A1c was 14.9%. She has had many previous treatment regimens and was taking novolin 26u at breakfast and metformin 500mg  BID prior to running out of insulin. She is discharged on Insulin 70/30 mix 40u qBreakfast and 20u qDinner. This will likely need to be increased.   Chronic hyperglycemia is thought to contribute to recurrent abscess and boil formation. A gathering boil was forming on her left lateral leg prior to arrival and this was treated with warm compresses and bactrim DS, and will likely require incision and drainage. Vulvovaginal candidiasis was treated with diflucan and an active HSV-2 lesion was treated with valacyclovir.   Lisinopril was titrated up to 20mg  with good blood pressure control during this admission.   Issues for Follow Up:  - Monitor medical adherence and ability to afford medications (70/30 insulin, Reli-On CBG meter, and test strips at [facility]) - f/u glucose readings and modify insulin as needed (discharged on 70/30: 40u qAM, 20u qPM) - Monitor BP and titrate lisinopril as needed. Consider repeat BMP - Evaluate boil on left lower extremity for possible I&D  Significant Procedures: None  Significant Labs and Imaging:   Recent Labs Lab 12/23/13 1414 12/23/13 1521 12/26/13 0530  WBC 7.1  --  5.6  HGB 13.5 15.6* 13.6  HCT 39.9 46.0 41.4  PLT 304  --  268    Recent Labs Lab 12/23/13 1414  12/24/13 0813 12/24/13 0951 12/24/13 1410 12/24/13 2005 12/26/13 0530  NA 126*  < > 137 136* 134* 131* 135*  K  4.4  < > 3.9 4.1 4.6 4.5 4.5  CL 88*  < > 104 104 101 101 103  CO2 14*  < > 21 21 21 19  17*  GLUCOSE 835*  < > 180* 154* 235* 378* 251*  BUN 13  < > 6 5* 5* 11 7  CREATININE 0.78  < > 0.54 0.56 0.69 0.83 0.74  CALCIUM 8.2*  < > 7.9* 7.9* 8.1* 8.6 8.5  ALKPHOS 106  --   --   --   --   --   --   AST 11  --   --   --   --   --   --   ALT <5  --   --   --   --   --   --   ALBUMIN 3.5  --   --   --   --   --   --   < > = values in this interval not displayed.  12/23/2013 16:55   Color, Urine  YELLOW   APPearance  CLEAR   Specific Gravity, Urine  1.035 (H)   pH  5.0   Glucose  >1000 (A)   Bilirubin Urine  NEGATIVE   Ketones, ur  >80 (A)   Protein  NEGATIVE   Urobilinogen, UA  0.2   Nitrite  NEGATIVE   Leukocytes, UA  NEGATIVE   Hgb urine dipstick  LARGE (A)   Urine-Other  AMORPHOUS URATES/PHOSPHATES   WBC, UA  0-2   RBC / HPF  0-2   Squamous Epithelial / LPF  RARE   Bacteria, UA  RARE   Pt currently experiencing menses   12/23/2013 17:00   Preg Test, Ur  NEGATIVE     12/23/2013 14:14   Acetone, Bld  SMALL (A)     12/23/2013 15:22   Sample type  VENOUS   pH, Ven  7.308 (H)   pCO2, Ven  29.5 (L)   pO2, Ven  72.0 (H)   Bicarbonate  14.8 (L)   TCO2  16   Acid-base deficit  10.0 (H)   O2 Saturation  93.0   Patient temperature  98.3 F    Results/Tests Pending at Time of Discharge: None  Discharge Medications:    Medication List    STOP taking these medications       insulin regular 100 units/mL injection  Commonly known as:  NOVOLIN R,HUMULIN R     metFORMIN 500 MG tablet  Commonly known as:  GLUCOPHAGE      TAKE these medications       insulin aspart protamine- aspart (70-30) 100 UNIT/ML injection  Commonly known as:  NOVOLOG MIX 70/30  Inject 40 units with breakfast and 20 units with supper daily     lisinopril 20 MG tablet  Commonly known as:  PRINIVIL,ZESTRIL  Take 1 tablet (20 mg total) by mouth daily.     sulfamethoxazole-trimethoprim 800-160 MG per  tablet  Commonly known as:  BACTRIM DS  Take 1 tablet by mouth 2 (two) times daily.       Discharge Instructions: Please refer to Patient Instructions section of EMR for full details.  Patient was counseled important signs and symptoms that should prompt return to medical care, changes in medications, dietary  instructions, activity restrictions, and follow up appointments.   Follow-Up Appointments:     Follow-up Information   Follow up with Myra RudeSchmitz, Jeremy E, MD On 12/31/2013. (2:30pm)    Specialty:  Family Medicine   Contact information:   [address] [place] [address] [phone]      Hazeline Junkeryan Melita Villalona, MD 12/26/2013, 10:08 AM PGY-1, [facility]

## 2013-12-26 NOTE — Progress Notes (Signed)
Pt had a 6 beat run of V-tach, no s/s, BP:133/89, HR 79, MD notified, ordered Bmet in AM, will continue to monitor, Thanks Lavonda JumboMike F RN

## 2013-12-26 NOTE — Discharge Instructions (Signed)
You were admitted with DKA due to insufficient treatment of your diabetes. Your regimen has been changed to:   Insulin 70/30 mix: 40 units with breakfast everyday AND 20 units with supper everyday. This has been sent to your pharmacy and is $25 per vial, along with a prescription for glucose test strips which is $9 per box. You can get a glucose meter over the counter for $16. This should be a very affordable regimen. Please call the clinic at [phone] if you have any problems with the prescriptions.   You were on lisinopril for blood pressure and the dose of this was increased during the hospital stay to 20mg  daily. This new prescription has been sent to your pharmacy.   For your boil, take bactrim twice a day for the next 10 days, and this may require incision and drainage. You should take tylenol for pain.   If you experience fever, chest pain, shortness of breath, or if your blood sugar remains >200 for more than 2 days, please call the clinic or go to the emergency room.

## 2013-12-26 NOTE — Progress Notes (Signed)
FMTS Attending Note  I personally saw and evaluated the patient. The plan of care was discussed with the resident team. I agree with the assessment and plan as documented by the resident.   1. DKA - anion gap slightly widened today however glucoses stable, will need repeat BMP as outpatient 2. Type 2 Diabetes - patient initiated on 70/30 insulin 40 units in the Am and 20 units with supper, monitor glucoses to titrate dose as outpatient 3. HTN- controlled on Lisinpril  4. No health insurance/difficulty affording meds - SW and case management have met with patient to discuss medication options  5. Boil left lower extremity - not amenable to drainage at this time, warm compresses and Bactrim  Stable for discharge.  Dossie Arbour MD

## 2013-12-26 NOTE — Progress Notes (Signed)
Family Medicine Teaching Service Daily Progress Note Intern Pager: [phone]  Patient name: Rebecca SiKeisha Iacovelli Medical record number: [id number] Date of birth: 01/21/1981 Age: 33 y.o. Gender: female  Primary Care Provider: Tawni CarnesWight, Andrew, MD Consultants: None Code Status: Full  Pt Overview and Major Events to Date:  1/12: Admitted in DKA 1/13: Taken off insulin drip, transitioned to 70/30 BID  1/14: Transfer to floor, Continued titration of insulin requirement  Assessment and Plan: Rebecca Rhodes is a 33 y.o. female presenting with polyuria/dipsia after running out of insulin 1 week ago, found to be in DKA. PMH is significant for T2DM and history of poor medication adherence, HTN, and hyperlipidemia.   Diabetic ketoacidosis: On arrival: VBG: pH 7.30, bicarb: 14, glucose: 835, AG: 24. +ketones in serum and urine. No S/Sxs infection. - 70/30 insulin BID (40u with breakfast, 20u with supper) - BMP this AM shows gap of 15, bicarb 17, glu 251; will f/u with BMP as outpatient tomorrow.  - Hb A1c: 14.9%  Electrolyte derangements Improved and stable  HTN: Lisinopril 20mg  daily now with good control   Social/financial limitations: Source of medical non-adherence, which is primary cause of this admission.  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 No events overnight. Eating nearly all of her meals.   Objective: Temp:  [97.9 F (36.6 C)-98.4 F (36.9 C)] 97.9 F (36.6 C) (01/15 0603) Pulse  Rate:  [73-82] 73 (01/15 0603) Resp:  [18-20] 18 (01/15 0603) BP: (127-139)/(88-91) 131/90 mmHg (01/15 0603) SpO2:  [99 %-100 %] 100 % (01/15 0603) Weight:  [209 lb 1.6 oz (94.847 kg)-213 lb (96.616 kg)] 209 lb 1.6 oz (94.847 kg) (01/15 0603) Physical Exam: General: WDWN, pleasant female laying in bed in NAD Cardiovascular: RRR, no murmur, no JVD  Respiratory: Nonlabored, CTAB  Abdomen: +BS, soft, NT, ND, no guarding  Extremities: WWP, no edema, 2+ DP and radial pulses  Skin: 1.5cm x 1.5cm subcutaneous induration without overlying erythema tender to palpation on left lateral calf Neuro: Alert and oriented, strength symmetric, 5/5  Laboratory:  Recent Labs Lab 12/23/13 1414 12/23/13 1521 12/26/13 0530  WBC 7.1  --  5.6  HGB 13.5 15.6* 13.6  HCT 39.9 46.0 41.4  PLT 304  --  268    Recent Labs Lab 12/23/13 1414  12/24/13 1410 12/24/13 2005 12/26/13 0530  NA 126*  < > 134* 131* 135*  K 4.4  < > 4.6 4.5 4.5  CL 88*  < > 101 101 103  CO2 14*  < > 21 19 17*  BUN 13  < > 5* 11 7  CREATININE 0.78  < > 0.69 0.83 0.74  CALCIUM 8.2*  < > 8.1* 8.6 8.5  PROT 7.8  --   --   --   --   BILITOT 0.3  --   --   --   --   ALKPHOS 106  --   --   --   --   ALT <5  --   --   --   --  AST 11  --   --   --   --   GLUCOSE 835*  < > 235* 378* 251*  < > = values in this interval not displayed.  12/23/2013 16:55   Color, Urine  YELLOW   APPearance  CLEAR   Specific Gravity, Urine  1.035 (H)   pH  5.0   Glucose  >1000 (A)   Bilirubin Urine  NEGATIVE   Ketones, ur  >80 (A)   Protein  NEGATIVE   Urobilinogen, UA  0.2   Nitrite  NEGATIVE   Leukocytes, UA  NEGATIVE   Hgb urine dipstick  LARGE (A)   Urine-Other  AMORPHOUS URATES/PHOSPHATES   WBC, UA  0-2   RBC / HPF  0-2   Squamous Epithelial / LPF  RARE   Bacteria, UA  RARE   Pt currently experiencing menses   12/23/2013 17:00   Preg Test, Ur  NEGATIVE     12/23/2013 14:14   Acetone, Bld  SMALL (A)     12/23/2013 15:22   Sample  type  VENOUS   pH, Ven  7.308 (H)   pCO2, Ven  29.5 (L)   pO2, Ven  72.0 (H)   Bicarbonate  14.8 (L)   TCO2  16   Acid-base deficit  10.0 (H)   O2 Saturation  93.0   Patient temperature  98.3 F    Hazeline Junker, MD 12/26/2013, 8:21 AM PGY-1, [facility] FPTS Intern pager: [phone], text pages welcome

## 2013-12-27 ENCOUNTER — Other Ambulatory Visit: Payer: Self-pay

## 2013-12-27 NOTE — ED Provider Notes (Signed)
screening examination/treatment/procedure(s) were conducted as a shared visit with non-physician practitioner(s) and myself.  I personally evaluated the patient during the encounter.  EKG Interpretation    Date/Time:    Ventricular Rate:    PR Interval:    QRS Duration:   QT Interval:    QTC Calculation:   R Axis:     Text Interpretation:               33 year old female with generalized fatigue. Polyuria/polydipsia. Noncompliant with her insulin. Noted to be in DKA. Anion gap. Metabolic acidosis. Ketones. IV fluids. Insulin drip. Admission for further evaluation/management of DKA.  CRITICAL CARE Performed by: Raeford RazorKOHUT, Gerald Honea  Total critical care time: 35 minutes  Critical care time was exclusive of separately billable procedures and treating other patients. Critical care was necessary to treat or prevent imminent or life-threatening deterioration. Critical care was time spent personally by me on the following activities: development of treatment plan with patient and/or surrogate as well as nursing, discussions with consultants, evaluation of patient's response to treatment, examination of patient, obtaining history from patient or surrogate, ordering and performing treatments and interventions, ordering and review of laboratory studies, ordering and review of radiographic studies, pulse oximetry and re-evaluation of patient's condition.    Raeford RazorStephen Tranquilino Fischler, MD 12/27/13 [phone]

## 2013-12-30 NOTE — Discharge Summary (Signed)
I agree with the discharge summary as documented.   Suttyn Cryder MD  

## 2013-12-31 ENCOUNTER — Ambulatory Visit (INDEPENDENT_AMBULATORY_CARE_PROVIDER_SITE_OTHER): Payer: Medicaid Other | Admitting: Family Medicine

## 2013-12-31 ENCOUNTER — Encounter: Payer: Self-pay | Admitting: Family Medicine

## 2013-12-31 VITALS — BP 126/87 | HR 84 | Temp 98.5°F | Wt 205.0 lb

## 2013-12-31 DIAGNOSIS — L0292 Furuncle, unspecified: Secondary | ICD-10-CM

## 2013-12-31 DIAGNOSIS — L02429 Furuncle of limb, unspecified: Secondary | ICD-10-CM

## 2013-12-31 DIAGNOSIS — E119 Type 2 diabetes mellitus without complications: Secondary | ICD-10-CM

## 2013-12-31 MED ORDER — INSULIN NPH ISOPHANE & REGULAR (70-30) 100 UNIT/ML ~~LOC~~ SUSP: SUBCUTANEOUS | Status: DC

## 2013-12-31 NOTE — Patient Instructions (Addendum)
Thank you for coming in,   Please call us back if you are not able to buy any of the medications. I will call the pharmacy.   Please let me know if you have any fevers or there is increased drainage from the site. Please follow up in two days to have the dressing removed.     Please keep the site dry and clean.   Please feel free to call with any questions or concerns at any time, at [phone]. --Dr. Jordan LikesSchmitz

## 2013-12-31 NOTE — Progress Notes (Signed)
   Subjective:     Patient ID: Rebecca Rhodes, female   DOB: 05/09/1981, 33 y.o.   MRN: [id number]  HPI  Rebecca Rhodes present for a follow up to her recent hospital admission   She states that she hasn't been able to afford the 70/30 regimen that we discharged her on because it cost $200. She has an old glucose meter and reported that her sugar was 400 as of this morning. She denies any tiredness, polyuria, diaphoresis, shortness of breath or headaches.   Her boil seems to be getting more painful.  It is bigger now compared to when she was in the hospital. It has been draining but only a little. The drainage is serosanguinous and doesn't contain an odor.  She denies any fever or chills. She denies any weakness or swelling above or below the site.   Review of Systems All other systems reviewed and otherwise normal.      Objective:   Physical Exam BP 126/87  Pulse 84  Temp(Src) 98.5 F (36.9 C) (Oral)  Wt 205 lb (92.987 kg)  LMP 12/12/2013 Gen: NAD, alert, obese, African American female, cooperative with exam CV: RRR, good S1/S2, no murmur Resp: CTABL, no wheezes, non-labored Ext: No edema, warm, 2 cm x 2 cm boil located on left lateral leg, no drainage noted but there is dried drainage on her band aid covering the site.   Procedure note  (I&D) Risks and benefits were discussed with the patient. Written consent was obtained. Area was prepped wit an alchool swab. 10cc of 1% lidocaine with epinphraine was injected around the boil using a 25 gauge needle. Area was then prepped and draped in a strerile fashion. 1 cm incision was made over the area of boil using 11 blade. Purulent drainage was obtained and would cultures were sent. Using a curved hemnostat loculations were attempted to broken. 1/2 inch iodiform gauze was used to pack the wound. A pressure dressing was applied.  No complications were encountered. Blood loss iminimal. Patient tolerated the procedure well.       Assessment:         Plan:

## 2013-12-31 NOTE — Assessment & Plan Note (Signed)
I&D performed today on boil on left lateral leg.  - informed to follow up in two days to have the packing removed  - care instructions were given

## 2013-12-31 NOTE — Assessment & Plan Note (Signed)
I called the pharmacy to rectify her medications. I sent a new prescription for the Novolin 70/30.  This should be amenable to her budget.  - I informed her to call back if these medications were still too expensive  - deferred any testing while here bc her sugars will be elevated with on medication - informed to follow up with PCP soon in order to make adjustments as necessary

## 2014-01-02 ENCOUNTER — Ambulatory Visit (INDEPENDENT_AMBULATORY_CARE_PROVIDER_SITE_OTHER): Payer: Medicaid Other | Admitting: Family Medicine

## 2014-01-02 ENCOUNTER — Encounter: Payer: Self-pay | Admitting: Family Medicine

## 2014-01-02 VITALS — BP 137/90 | HR 93 | Temp 97.9°F | Wt 204.0 lb

## 2014-01-02 DIAGNOSIS — E1310 Other specified diabetes mellitus with ketoacidosis without coma: Secondary | ICD-10-CM

## 2014-01-02 DIAGNOSIS — R739 Hyperglycemia, unspecified: Secondary | ICD-10-CM

## 2014-01-02 DIAGNOSIS — R7309 Other abnormal glucose: Secondary | ICD-10-CM

## 2014-01-02 DIAGNOSIS — E119 Type 2 diabetes mellitus without complications: Secondary | ICD-10-CM

## 2014-01-02 DIAGNOSIS — L02429 Furuncle of limb, unspecified: Secondary | ICD-10-CM

## 2014-01-02 LAB — GLUCOSE, CAPILLARY
GLUCOSE-CAPILLARY: 593 mg/dL — AB (ref 70–99)
GLUCOSE-CAPILLARY: 558 mg/dL — AB (ref 70–99)

## 2014-01-02 MED ORDER — INSULIN ASPART 100 UNIT/ML ~~LOC~~ SOLN: 25.0000 [IU] | Freq: Once | SUBCUTANEOUS | Status: DC

## 2014-01-02 MED ORDER — INSULIN ASPART 100 UNIT/ML ~~LOC~~ SOLN
25.0000 [IU] | Freq: Once | SUBCUTANEOUS | Status: AC
  Administered 2014-01-02: 25 [IU] via SUBCUTANEOUS

## 2014-01-02 NOTE — Patient Instructions (Signed)
We gave you some insulin today. You HAVE to pick up your insulin tonight if you want your wound to heal up. Start as previously instructed once you pick it up.   For your wound, you can soak it in the tub once a day then cover with a dry dressing (can use antibiotic ointment if needed). Keep taking your Bactrim until it is finished. Come see us if you have fever, expanding redness, or worsening pain.   See Dr. Waynetta SandyWight in the next week or two to follow up on diabetes and if you cannot see him, see Dr. Jordan LikesSchmitz or Dr. Raymondo BandKoval,  Dr. Durene CalHunter

## 2014-01-03 LAB — WOUND CULTURE
GRAM STAIN: NONE SEEN
Gram Stain: NONE SEEN

## 2014-01-03 NOTE — Assessment & Plan Note (Signed)
CBG >575 in office and gave patient 25 units of aspart. 1 hour later still >550. Patient does not feel poorly and expect CBG to continue to trend down. Patient was set up with bus pass and directions on how to get to [facility] to pick up her 70/30 insulin today and to start 20 units this evening with 40 units tomorrow morning.   I called patient day after visit and states she found NPH at home and gave herself 40 units last night (not sure why did not give 20) and her CBG this morning was 134. She tells me once again that she will pick medicine up today (but promised me this yesterday). Instructed patient to start with 40 units before breakfast and 20 units before dinner. She states she will comply. Will forward to PCP for further follow up. Had advised follow up next week.

## 2014-01-03 NOTE — Progress Notes (Signed)
Rebecca ConchStephen Mako Pelfrey, MD Phone: [phone]  Subjective:  Chief complaint-noted  Patient presents for removal of packing after I+D of abscess 2 days ago. Patient still having some tenderness but much improved. I inquired about DM and patient states still has not gotten insulin and CBGs are in the 500s. Patient has not had a ride to get to [facility] to get her insulin. She is still taking her Bactrim DS.  ROS-endorses polyuria. No abdominal pain. Does not feel poorly overall. Only mild fatigue.   Past Medical History-HTN, history DKA, poorly controlled DM, HTN, HLD.   Medications- reviewed and updated Current Outpatient Prescriptions  Medication Sig Dispense Refill  . glucose blood test strip Use as instructed  50 each  12  . insulin NPH-regular Human (NOVOLIN 70/30) (70-30) 100 UNIT/ML injection 40 U in the morning and 20 U at night  10 mL  11  . lisinopril (PRINIVIL,ZESTRIL) 20 MG tablet Take 1 tablet (20 mg total) by mouth daily.  30 tablet  0  . sulfamethoxazole-trimethoprim (BACTRIM DS) 800-160 MG per tablet Take 1 tablet by mouth 2 (two) times daily.  20 tablet  0   Current Facility-Administered Medications  Medication Dose Route Frequency Provider Last Rate Last Dose  . TDaP (BOOSTRIX) injection 0.5 mL  0.5 mL Intramuscular Once Reva Boresanya S Pratt, MD        Objective: BP 137/90  Pulse 93  Temp(Src) 97.9 F (36.6 C) (Oral)  Wt 204 lb (92.534 kg)  LMP 12/12/2013  Breastfeeding? No Gen: NAD, resting comfortably in chair Resp: normal breathing rate, non labored breathing Ext: No edema or erythema. 1.5 cm incision noted. Packing removed. No drainage.   Assessment/Plan:  Hyperglycemia CBG >575 in office and gave patient 25 units of aspart. 1 hour later still >550. Patient does not feel poorly and expect CBG to continue to trend down. Patient was set up with bus pass and directions on how to get to [facility] to pick up her 70/30 insulin today and to start 20 units this evening with 40  units tomorrow morning.   I called patient day after visit and states she found NPH at home and gave herself 40 units last night (not sure why did not give 20) and her CBG this morning was 134. She tells me once again that she will pick medicine up today (but promised me this yesterday). Instructed patient to start with 40 units before breakfast and 20 units before dinner. She states she will comply. Will forward to PCP for further follow up. Had advised follow up next week.   Boil, leg Removed packing. Wound still open. Still tender on inside of wound but no fluctuance and unable to express any pus. Advised patient she can wash out daily in a tub now and let it soak then cover when up and moving   >50% of 25 minute office visit was spent on counseling (need to pick up insulin and effects of poorly controlled Dm and coordination of care (helping to get to [facility], watching for over an hour after aspart injection)  Orders Placed This Encounter  Procedures  . Glucose, capillary  . Glucose, capillary  . Glucose (CBG)    Meds ordered this encounter  Medications  . DISCONTD: insulin aspart (NOVOLOG) 100 UNIT/ML injection    Sig: Inject 25 Units into the skin once.    Dispense:  1 vial    Refill:  0  . insulin aspart (novoLOG) injection 25 Units    Sig:

## 2014-01-03 NOTE — Assessment & Plan Note (Signed)
Removed packing. Wound still open. Still tender on inside of wound but no fluctuance and unable to express any pus. Advised patient she can wash out daily in a tub now and let it soak then cover when up and moving

## 2014-01-07 ENCOUNTER — Telehealth: Payer: Self-pay | Admitting: *Deleted

## 2014-01-07 NOTE — Telephone Encounter (Signed)
Message copied by Farrell OursEVANS, Jailyn Langhorst K on Tue Jan 07, 2014  9:19 AM ------      Message from: Clare GandySCHMITZ, JEREMY E      Created: Mon Jan 06, 2014  9:07 PM       Please call Ms. Somma and let her know that her would culture resulted with normal organisms with no signs of infection. Thank you. ------

## 2014-01-07 NOTE — Telephone Encounter (Signed)
Related message,patient voiced understanding. Rebecca Rhodes S  

## 2014-01-07 NOTE — Telephone Encounter (Signed)
LVM for patient to call back. ?

## 2014-01-21 ENCOUNTER — Ambulatory Visit: Payer: Self-pay | Admitting: Family Medicine

## 2014-01-28 ENCOUNTER — Ambulatory Visit: Payer: Self-pay | Admitting: Family Medicine

## 2014-01-30 ENCOUNTER — Telehealth: Payer: Self-pay | Admitting: *Deleted

## 2014-01-30 DIAGNOSIS — E119 Type 2 diabetes mellitus without complications: Secondary | ICD-10-CM

## 2014-01-30 NOTE — Telephone Encounter (Signed)
LMOVM for pt to return call.  Please advise that since she is diabetic she is due for a cholesterol check.  Please schedule her fasting lab visit.  Future order placed. Fleeger, Jessica Dawn 

## 2014-02-20 ENCOUNTER — Telehealth: Payer: Self-pay | Admitting: General Practice

## 2014-02-20 NOTE — Telephone Encounter (Signed)
[facility]. Per chart review [facility]. Called patient to discuss this, no answer though- left message to please call us back at the clinics and you can also let us know if we can leave a detailed message

## 2014-02-24 NOTE — Telephone Encounter (Signed)
Per chart the patient has 12 refills on test strips and is being managed by [facility].

## 2014-06-22 ENCOUNTER — Telehealth: Payer: Self-pay | Admitting: Family Medicine

## 2014-06-23 NOTE — Telephone Encounter (Signed)
Is there something I need to do?

## 2014-08-29 ENCOUNTER — Encounter (HOSPITAL_COMMUNITY): Payer: Self-pay | Admitting: Emergency Medicine

## 2014-08-29 ENCOUNTER — Inpatient Hospital Stay (HOSPITAL_COMMUNITY)
Admission: EM | Admit: 2014-08-29 | Discharge: 2014-08-31 | DRG: 638 | Disposition: A | Discharge status: Home / Self Care | Payer: Medicaid Other | Attending: Family Medicine | Admitting: Family Medicine

## 2014-08-29 DIAGNOSIS — I10 Essential (primary) hypertension: Secondary | ICD-10-CM | POA: Diagnosis present

## 2014-08-29 DIAGNOSIS — B373 Candidiasis of vulva and vagina: Secondary | ICD-10-CM | POA: Diagnosis present

## 2014-08-29 DIAGNOSIS — O10019 Pre-existing essential hypertension complicating pregnancy, unspecified trimester: Secondary | ICD-10-CM

## 2014-08-29 DIAGNOSIS — E785 Hyperlipidemia, unspecified: Secondary | ICD-10-CM | POA: Diagnosis present

## 2014-08-29 DIAGNOSIS — Z8614 Personal history of Methicillin resistant Staphylococcus aureus infection: Secondary | ICD-10-CM | POA: Diagnosis not present

## 2014-08-29 DIAGNOSIS — E873 Alkalosis: Secondary | ICD-10-CM | POA: Diagnosis present

## 2014-08-29 DIAGNOSIS — Z9119 Patient's noncompliance with other medical treatment and regimen: Secondary | ICD-10-CM

## 2014-08-29 DIAGNOSIS — N750 Cyst of Bartholin's gland: Secondary | ICD-10-CM | POA: Diagnosis present

## 2014-08-29 DIAGNOSIS — Z794 Long term (current) use of insulin: Secondary | ICD-10-CM

## 2014-08-29 DIAGNOSIS — IMO0001 Reserved for inherently not codable concepts without codable children: Secondary | ICD-10-CM | POA: Diagnosis not present

## 2014-08-29 DIAGNOSIS — E871 Hypo-osmolality and hyponatremia: Secondary | ICD-10-CM | POA: Diagnosis present

## 2014-08-29 DIAGNOSIS — Z91199 Patient's noncompliance with other medical treatment and regimen due to unspecified reason: Secondary | ICD-10-CM | POA: Diagnosis not present

## 2014-08-29 DIAGNOSIS — B3731 Acute candidiasis of vulva and vagina: Secondary | ICD-10-CM

## 2014-08-29 DIAGNOSIS — Z79899 Other long term (current) drug therapy: Secondary | ICD-10-CM

## 2014-08-29 DIAGNOSIS — N751 Abscess of Bartholin's gland: Secondary | ICD-10-CM

## 2014-08-29 DIAGNOSIS — R739 Hyperglycemia, unspecified: Secondary | ICD-10-CM | POA: Diagnosis present

## 2014-08-29 DIAGNOSIS — R7309 Other abnormal glucose: Secondary | ICD-10-CM

## 2014-08-29 DIAGNOSIS — L02429 Furuncle of limb, unspecified: Secondary | ICD-10-CM

## 2014-08-29 DIAGNOSIS — E1165 Type 2 diabetes mellitus with hyperglycemia: Secondary | ICD-10-CM | POA: Diagnosis present

## 2014-08-29 DIAGNOSIS — N949 Unspecified condition associated with female genital organs and menstrual cycle: Secondary | ICD-10-CM | POA: Diagnosis not present

## 2014-08-29 LAB — BASIC METABOLIC PANEL
ANION GAP: 15 (ref 5–15)
BUN: 9 mg/dL (ref 6–23)
CHLORIDE: 94 meq/L — AB (ref 96–112)
CO2: 21 meq/L (ref 19–32)
Calcium: 8.9 mg/dL (ref 8.4–10.5)
Creatinine, Ser: 0.75 mg/dL (ref 0.50–1.10)
GFR calc Af Amer: >90 mL/min (ref >90)
GFR calc non Af Amer: >90 mL/min (ref >90)
Glucose, Bld: 738 mg/dL (ref 70–99)
Potassium: 4.2 mEq/L (ref 3.7–5.3)
Sodium: 130 mEq/L — ABNORMAL LOW (ref 137–147)

## 2014-08-29 LAB — CBC
HCT: 40.7 % (ref 36.0–46.0)
Hemoglobin: 13.3 g/dL (ref 12.0–15.0)
MCH: 28.3 pg (ref 26.0–34.0)
MCHC: 32.7 g/dL (ref 30.0–36.0)
MCV: 86.6 fL (ref 78.0–100.0)
Platelets: 227 10*3/uL (ref 150–400)
RBC: 4.7 MIL/uL (ref 3.87–5.11)
RDW: 13.6 % (ref 11.5–15.5)
WBC: 7 10*3/uL (ref 4.0–10.5)

## 2014-08-29 LAB — URINALYSIS, ROUTINE W REFLEX MICROSCOPIC
Bilirubin Urine: NEGATIVE
Glucose, UA: >1000 mg/dL — AB
Ketones, ur: 15 mg/dL — AB
LEUKOCYTES UA: NEGATIVE
Nitrite: NEGATIVE
Protein, ur: NEGATIVE mg/dL
SPECIFIC GRAVITY, URINE: 1.041 — AB (ref 1.005–1.030)
UROBILINOGEN UA: 0.2 mg/dL (ref 0.0–1.0)
pH: 5.5 (ref 5.0–8.0)

## 2014-08-29 LAB — I-STAT VENOUS BLOOD GAS, ED
Acid-base deficit: 1 mmol/L (ref 0.0–2.0)
Bicarbonate: 22.4 mEq/L (ref 20.0–24.0)
O2 Saturation: 86 %
PH VEN: 7.441 — AB (ref 7.250–7.300)
TCO2: 23 mmol/L (ref 0–100)
pCO2, Ven: 32.9 mmHg — ABNORMAL LOW (ref 45.0–50.0)
pO2, Ven: 48 mmHg — ABNORMAL HIGH (ref 30.0–45.0)

## 2014-08-29 LAB — POC URINE PREG, ED: PREG TEST UR: NEGATIVE

## 2014-08-29 LAB — URINE MICROSCOPIC-ADD ON

## 2014-08-29 LAB — CBG MONITORING, ED: Glucose-Capillary: >600 mg/dL (ref 70–99)

## 2014-08-29 LAB — GLUCOSE, CAPILLARY: Glucose-Capillary: 267 mg/dL — ABNORMAL HIGH (ref 70–99)

## 2014-08-29 MED ORDER — SODIUM CHLORIDE 0.9 % IV SOLN
INTRAVENOUS | Status: AC
  Administered 2014-08-30 (×2): via INTRAVENOUS

## 2014-08-29 MED ORDER — SODIUM CHLORIDE 0.9 % IV SOLN
INTRAVENOUS | Status: DC
  Filled 2014-08-29: qty 2.5

## 2014-08-29 MED ORDER — HEPARIN SODIUM (PORCINE) 5000 UNIT/ML IJ SOLN
5000.0000 [IU] | Freq: Three times a day (TID) | INTRAMUSCULAR | Status: DC
  Administered 2014-08-29 – 2014-08-31 (×5): 5000 [IU] via SUBCUTANEOUS
  Filled 2014-08-29 (×8): qty 1

## 2014-08-29 MED ORDER — SODIUM CHLORIDE 0.9 % IJ SOLN: 3.0000 mL | Freq: Two times a day (BID) | INTRAMUSCULAR | Status: DC

## 2014-08-29 MED ORDER — SODIUM CHLORIDE 0.9 % IV BOLUS (SEPSIS)
1000.0000 mL | Freq: Once | INTRAVENOUS | Status: AC
  Administered 2014-08-29: 1000 mL via INTRAVENOUS

## 2014-08-29 MED ORDER — INSULIN ASPART 100 UNIT/ML ~~LOC~~ SOLN
0.0000 [IU] | SUBCUTANEOUS | Status: DC
  Administered 2014-08-29: 8 [IU] via SUBCUTANEOUS

## 2014-08-29 MED ORDER — INSULIN ASPART 100 UNIT/ML ~~LOC~~ SOLN
10.0000 [IU] | Freq: Once | SUBCUTANEOUS | Status: AC
  Administered 2014-08-29: 10 [IU] via INTRAVENOUS
  Filled 2014-08-29: qty 1

## 2014-08-29 MED ORDER — ACETAMINOPHEN 325 MG PO TABS: 650.0000 mg | ORAL_TABLET | Freq: Four times a day (QID) | ORAL | Status: DC | PRN

## 2014-08-29 MED ORDER — DOXYCYCLINE HYCLATE 100 MG PO TABS
100.0000 mg | ORAL_TABLET | Freq: Every day | ORAL | Status: DC
  Administered 2014-08-29 – 2014-08-31 (×3): 100 mg via ORAL
  Filled 2014-08-29 (×3): qty 1

## 2014-08-29 MED ORDER — ACETAMINOPHEN 650 MG RE SUPP
650.0000 mg | Freq: Four times a day (QID) | RECTAL | Status: DC | PRN
Start: 2014-08-29 — End: 2014-08-31

## 2014-08-29 MED ORDER — FLUCONAZOLE 150 MG PO TABS
150.0000 mg | ORAL_TABLET | Freq: Once | ORAL | Status: AC
  Administered 2014-08-29: 150 mg via ORAL
  Filled 2014-08-29: qty 1

## 2014-08-29 MED ORDER — DOXYCYCLINE HYCLATE 100 MG PO TABS: 100.0000 mg | ORAL_TABLET | Freq: Every day | ORAL | Status: DC

## 2014-08-29 MED ORDER — DEXTROSE-NACL 5-0.45 % IV SOLN: INTRAVENOUS | Status: DC

## 2014-08-29 MED ORDER — INSULIN ASPART 100 UNIT/ML ~~LOC~~ SOLN
0.0000 [IU] | Freq: Three times a day (TID) | SUBCUTANEOUS | Status: DC
  Administered 2014-08-30 (×2): 8 [IU] via SUBCUTANEOUS
  Administered 2014-08-30: 11 [IU] via SUBCUTANEOUS

## 2014-08-29 MED ORDER — INFLUENZA VAC SPLIT QUAD 0.5 ML IM SUSY
0.5000 mL | PREFILLED_SYRINGE | INTRAMUSCULAR | Status: DC
  Filled 2014-08-29: qty 0.5

## 2014-08-29 NOTE — ED Notes (Signed)
Took pts CBG, read HIGH. PA, RN notified.

## 2014-08-29 NOTE — ED Provider Notes (Signed)
CSN: [id number]     Arrival date & time 08/29/14  1122 History   First MD Initiated Contact with Patient 08/29/14 1603     Chief Complaint  Patient presents with  . Abscess     (Consider location/radiation/quality/duration/timing/severity/associated sxs/prior Treatment) The history is provided by the patient and medical records. No language interpreter was used.    Rebecca Rhodes is a 33 y.o. female  with a hx of IDDM presents to the Emergency Department complaining of gradual, persistent, progressively worsening abscess to the left labia onset 3 days ago.  Pt reports no treatments PTA, but believes the abscess ruptured while in the waiting room.  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  Pt reports her last insulin was yesterday, but that she has not been checking her blood sugar and does not know what it runs.  Pt denies fever, chills, headache, chest pain, SOB, abd pain, N/V/D, weakness, dizziness, syncope, dysuria, vaginal discharge, polydipsia, polyuria.  Walking aggravates the pain and nothing seems to make it better.      Past Medical History  Diagnosis Date  . HTN (hypertension) 06/04/2012  . Hyperlipidemia 06/04/2012  . Diabetes mellitus 06/04/2012  . HSV-2 infection   . DKA (diabetic ketoacidoses)   . Acute respiratory failure    Past Surgical History  Procedure Laterality Date  . Irrigation and debridement abscess  10/31/2012    Procedure: IRRIGATION AND DEBRIDEMENT ABSCESS;  Surgeon: Cherylynn Ridges, MD;  Location: [facility] OR;  Service: General;  Laterality: Bilateral;  . Cesarean section N/A 08/08/2013    Procedure: CESAREAN SECTION;  Surgeon: Tereso Newcomer, MD;  Location: [facility] ORS;  Service: Obstetrics;  Laterality: N/A;   Family History  Problem Relation Age of Onset  . Hypertension Mother   . Stroke Mother   . Heart disease Father   . Diabetes Father   . Cancer Maternal Grandfather   . Hypertension  Brother   . Diabetes Paternal Aunt   . Diabetes Paternal Uncle   . Diabetes Paternal Grandmother   . Diabetes Paternal Grandfather    History  Substance Use Topics  . Smoking status: Never Smoker   . Smokeless tobacco: Never Used  . Alcohol Use: No   OB History   Grav Para Term Preterm Abortions TAB SAB Ect Mult Living   Review of Systems  Constitutional: Negative for fever, chills, diaphoresis, appetite change, fatigue and unexpected weight change.  HENT: Negative for mouth sores.   Eyes: Negative for visual disturbance.  Respiratory: Negative for cough, chest tightness, shortness of breath and wheezing.   Cardiovascular: Negative for chest pain.  Gastrointestinal: Negative for nausea, vomiting, abdominal pain, diarrhea and constipation.  Endocrine: Negative for polydipsia, polyphagia and polyuria.  Genitourinary: Negative for dysuria, urgency, frequency and hematuria.  Musculoskeletal: Negative for back pain and neck stiffness.  Skin: Positive for rash.  Allergic/Immunologic: Negative for immunocompromised state.  Neurological: Negative for syncope, light-headedness and headaches.  Hematological: Does not bruise/bleed easily.  Psychiatric/Behavioral: Negative for sleep disturbance. The patient is not nervous/anxious.       Allergies  Penicillins  Home Medications   Prior to Admission medications   Medication Sig Start Date End Date Taking? Authorizing Provider  insulin aspart protamine- aspart (NOVOLOG MIX 70/30) (70-30) 100 UNIT/ML injection Inject 20-45 Units into the skin 2 (two) times daily with  a meal. 45 units in the morning, and 20 units before supper   Yes Historical Provider, MD  glucose blood test strip Use as instructed 12/26/13   Tyrone Nine, MD   BP 137/90  Pulse 89  Temp(Src) 98.4 F (36.9 C) (Oral)  Resp 19  SpO2 98%  LMP 08/22/2014 Physical Exam  Nursing note and vitals reviewed. Constitutional: She is oriented to person,  place, and time. She appears well-developed and well-nourished. No distress.  Awake, alert, nontoxic appearance  HENT:  Head: Normocephalic and atraumatic.  Mouth/Throat: Oropharynx is clear and moist. No oropharyngeal exudate.  Eyes: Conjunctivae are normal. No scleral icterus.  Neck: Normal range of motion. Neck supple.  Cardiovascular: Normal rate, regular rhythm, normal heart sounds and intact distal pulses.   Pulmonary/Chest: Effort normal and breath sounds normal. No respiratory distress. She has no wheezes.  Equal chest expansion  Abdominal: Soft. Bowel sounds are normal. She exhibits no distension and no mass. There is no tenderness. There is no rebound and no guarding.  Genitourinary:    There is no tenderness or lesion on the right labia. There is tenderness and lesion on the left labia. No vaginal discharge found.  NO TTP of the vaginal wall No tenderness to the rectum on DRE Mild TTP of the anterior perineum near the abscess without induration  Musculoskeletal: Normal range of motion. She exhibits no edema.  Lymphadenopathy:    She has no cervical adenopathy.  Neurological: She is alert and oriented to person, place, and time.  Speech is clear and goal oriented Moves extremities without ataxia  Skin: Skin is warm and dry. She is not diaphoretic.  Psychiatric: She has a normal mood and affect.    ED Course  Procedures (including critical care time) Labs Review Labs Reviewed  BASIC METABOLIC PANEL - Abnormal; Notable for the following:    Sodium 130 (*)    Chloride 94 (*)    Glucose, Bld 738 (*)    All other components within normal limits  URINALYSIS, ROUTINE W REFLEX MICROSCOPIC - Abnormal; Notable for the following:    APPearance HAZY (*)    Specific Gravity, Urine 1.041 (*)    Glucose, UA >1000 (*)    Hgb urine dipstick TRACE (*)    Ketones, ur 15 (*)    All other components within normal limits  URINE MICROSCOPIC-ADD ON - Abnormal; Notable for the following:     Squamous Epithelial / LPF MANY (*)    Bacteria, UA FEW (*)    All other components within normal limits  CBG MONITORING, ED - Abnormal; Notable for the following:    Glucose-Capillary >600 (*)    All other components within normal limits  I-STAT VENOUS BLOOD GAS, ED - Abnormal; Notable for the following:    pH, Ven 7.441 (*)    pCO2, Ven 32.9 (*)    pO2, Ven 48.0 (*)    All other components within normal limits  CBC  OSMOLALITY  POC URINE PREG, ED    Imaging Review No results found.   EKG Interpretation None         MDM   Final diagnoses:  Hyperglycemia without ketosis  Bartholin's gland abscess   Rebecca Rhodes presents with left bartholin gland abscess with spontaneous drainage.  No evidence of perirectal abscess.  Pt with hx of DKA and I suspect the same today. Will cover with doxycyline (STD and MRSA coverage) and plan for admission.   Patient noted to be hypertensive in  the emergency department.  No signs of hypertensive urgency.    6:37 PM Pt with glucose of 738, but no evidence of DKA. PH of 7.4.  Discussed with family medicine who will admit to telemetry due to her hypertension and hyperglycemia.      Dierdre Forth, PA-C 08/29/14 1839

## 2014-08-29 NOTE — ED Notes (Signed)
Per pt sts abscess to vaginal area. Denies drainage.

## 2014-08-29 NOTE — H&P (Signed)
Family Medicine Teaching [facility] Admission History and Physical Service Pager: [phone]  Patient name: Rebecca Rhodes Medical record number: [id number] Date of birth: Oct 11, 1981 Age: 33 y.o. Gender: female  Primary Care Provider: Tawni Carnes, MD Consultants: None Code Status: Full  Chief Complaint: Labial lesion  Assessment and Plan: Arden Axon is a 33 y.o. female presenting with a ruptured bartholin cyst who was found to be hyperglycemic with likely HHS. PMH is significant for T2DM, HTN, HLD, and HSV-2.  Hyperglycemic state, most likely HHS: Patient presented with glucose of 738. Mental status not altered. UA significant for glucose and no ketones.  I-stat VBG: pH 7.441, pCO2 32.9, pO2 48, bicarb 22 consistent primary respiratory alkalosis. No evidence of DKA. Reports polydipsia and polyuria, thus likely volume down to some extent. BUN/Cr wnl. History not significant for recent illness that would precipitate HHS. Patient reports non-compliance with insulin regimen, which is likely precipitant. Most recent HgA1c 14.9 in 12/2013.   --admit to telemetry, Dr. Lum Babe attending  -- S/p two 1L NS boluses, will bolus 1L on floor  -- NS @ 149mL/hr -- S/p lantus 10u in the ED  -- moderate SSI  -- F/u CBGs q4h -- F/u BMPs q4h -- F/u serum osmolality  -- F/u HgA1c -- F/u TSH  Uncontrolled Type II DM: Takes Novolin 70/30 40u (AM) and 20u (PM) at home but reports frequently missing doses. Most recent HgA1c 14.9 in 12/2013.  On previous admissions, resources were always a limiting factor for the patient and her continued care.  -- F/u HgA1c -- Management of hyperglycemia per above   Hyponatremia: On admission, Na 130. Corrected is 140. -- Per above, s/p two 1L NS boluses, will give 1 bolus on floor  -- NS @ 150 mL/hr  Bartholin cyst: Patient reports 2 day history of enlarging labial cyst that ruptured while in the ER. Exam findings consistent with this history. History of MRSA infection  in the past. Chronic hyperglycemia is thought to be the cause of her recurrent abscess and boil formation.  -- Doxycycline (9/18 - )  Likely vaginal candidiasis: Patient reports 1 mo of vaginal itching similar to prior vaginal candidal infections.  Has uncontrolled type II diabetes that would predispose her to yeast infections. -- Will give 1 dose  diflucan  HTN: BP elevated to 140s/100s while in the ER. Does not take any home medications. Was titrated to lisinopril 20 mg during last admission but doesn't report any medications on admission.  -- Consider adding anti-hypertensive   HLD: Likely needs treatment with moderate-high intensity statin given comorbidities.    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 Was not discharged on any suppressive medications.  -- Consider adding chronic suppressive therapy  FEN/GI: Carb modified diet Prophylaxis: SubQ heparin  Disposition: Telemetry   History of Present Illness: Rebecca Rhodes is a 33 y.o. female presenting with a ruptured bartholin cyst who was found to be hyperglycemic with likely HHS. PMH is significant for T2DM, HTN, HLD, and HSV-2. She was prompted to come the the ER due to a 2 day history of a "boil" of her left labia. While waiting in the ER, she felt it rupture and it decreased in size. She has a history of recurrent boils and gets one about every 6 months. While in the ER, she was found to have a glucose > 600. She denies confusion, blurry vision, or feeling out of sorts. She denies recent fevers, sweats, or chills. She  denies recent illnesses including vomiting or diarrhea. She reports that she often forgets to take her NPH 70/30 several times a week. She did not take her insulin this morning as she was planning to go get a refill today. She admits to polyuria and polydipsia. She also reports 76mo of vaginal itching that feels like prior yeast infections. She denies SOB, CP,  diarrhea, or constipation. She has some intermittent abdominal pain that feels like gas. She had a BM last night.  Of note she was most recently admitted to the ED in 12/2013 for DKA in the setting of poor medical adherence. She also had a boil, vulvovaginal candidiasis, and active HSV-2 at last admission.  Review Of Systems: Per HPI  Otherwise 12 point review of systems was performed and was unremarkable.  Patient Active Problem List   Diagnosis Date Noted  . Hyperglycemia without ketosis 08/29/2014  . Hyperglycemia 12/23/2013  . 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 Yeast infection involving the vagina and surrounding area 10/22/2013  . Boil, leg 06/24/2013  . 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 Benign essential hypertension antepartum 02/18/2013  . Acute respiratory failure 10/24/2012  . DKA (diabetic ketoacidoses) 06/04/2012  . HTN (hypertension) 06/04/2012  . Hyperlipidemia 06/04/2012  . Diabetes mellitus 06/04/2012   Past Medical History: Past Medical History  Diagnosis Date  . HTN (hypertension) 06/04/2012  . Hyperlipidemia 06/04/2012  . Diabetes mellitus 06/04/2012  . HSV-2 infection   . DKA (diabetic ketoacidoses)   . Acute respiratory failure    Past Surgical History: Past Surgical History  Procedure Laterality Date  . Irrigation and debridement abscess  10/31/2012    Procedure: IRRIGATION AND DEBRIDEMENT ABSCESS;  Surgeon: Cherylynn Ridges, MD;  Location: [facility] OR;  Service: General;  Laterality: Bilateral;  . Cesarean section N/A 08/08/2013    Procedure: CESAREAN SECTION;  Surgeon: Tereso Newcomer, MD;  Location: [facility] ORS;  Service: Obstetrics;  Laterality: N/A;   Social History: History  Substance Use Topics  . Smoking status: Never Smoker   . Smokeless tobacco: Never Used  . Alcohol Use: No   Additional social history: Works as Conservation officer, nature at [employer].  Please also refer to relevant sections of EMR.  Family History: Family History  Problem Relation Age of  Onset  . Hypertension Mother   . Stroke Mother   . Heart disease Father   . Diabetes Father   . Cancer Maternal Grandfather   . Hypertension Brother   . Diabetes Paternal Aunt   . Diabetes Paternal Uncle   . Diabetes Paternal Grandmother   . Diabetes Paternal Grandfather    Allergies and Medications: Allergies  Allergen Reactions  . Penicillins Rash   No current facility-administered medications on file prior to encounter.   Current Outpatient Prescriptions on File Prior to Encounter  Medication Sig Dispense Refill  . glucose blood test strip Use as instructed  50 each  12    Objective: BP 137/90  Pulse 89  Temp(Src) 98.4 F (36.9 C) (Oral)  Resp 19  SpO2 98%  LMP 08/22/2014  Exam: General: Well-appearing woman in NAD HEENT: PERRLA. EOMI. Mucus membranes slightly dry. Cardiovascular: RRR no m/r/g. No heaves, thrills. Respiratory: CTAB no wheezes or crackles.  Abdomen: +bs. Soft, nontender, nondistended.  Extremities: No LE edema Genitourinary: Tender, erythematous lesion of the left labia that appears to be draining. No streaking or labial edema.  Neuro: Alert and oriented x 3. No evidence of confusion or lethargy. 5/5 strength throughout. Sensation intact to light touch  bilaterally.   Labs and Imaging: CBC BMET   Recent Labs Lab 08/29/14 1624  WBC 7.0  HGB 13.3  HCT 40.7  PLT 227    Recent Labs Lab 08/29/14 1624  NA 130*  K 4.2  CL 94*  CO2 21  BUN 9  CREATININE 0.75  GLUCOSE 738*  CALCIUM 8.9      Upreg: negative I-stat VBG: pH 7.441, pCO2 32.9, pO2 48, bicarb 22   UA (9/18) Color, Urine: YELLOW APPearance: HAZY (A) Specific Gravity, Urine: 1.041 (H) pH: 5.5 Glucose: >1000 (A) Bilirubin Urine: NEGATIVE Ketones, ur: 15 (A) Protein: NEGATIVE Urobilinogen, UA: 0.2 Nitrite: NEGATIVE Leukocytes, UA: NEGATIVE Hgb urine dipstick: TRACE (A) WBC, UA: 3-6 RBC / HPF: 0-2 Squamous Epithelial / LPF: MANY (A) Bacteria, UA: FEW  (A)   Resa Miner, Med Student 08/29/2014, 7:46 PM Acting Intern, [place] [facility] FPTS Intern pager: [phone], text pages welcome  Upper Level Addendum:  I have seen and evaluated this patient along with MS Ms. Danie Chandler and reviewed the above note, making necessary revisions in [facility].   Clare Gandy, MD Family Medicine PGY-2

## 2014-08-30 LAB — BASIC METABOLIC PANEL
Anion gap: 12 (ref 5–15)
Anion gap: 16 — ABNORMAL HIGH (ref 5–15)
BUN: 5 mg/dL — AB (ref 6–23)
BUN: 5 mg/dL — AB (ref 6–23)
CALCIUM: 8.1 mg/dL — AB (ref 8.4–10.5)
CALCIUM: 7.9 mg/dL — AB (ref 8.4–10.5)
CO2: 22 mEq/L (ref 19–32)
CO2: 19 mEq/L (ref 19–32)
CREATININE: 0.54 mg/dL (ref 0.50–1.10)
CREATININE: 0.59 mg/dL (ref 0.50–1.10)
Chloride: 101 mEq/L (ref 96–112)
Chloride: 103 mEq/L (ref 96–112)
GFR calc Af Amer: >90 mL/min (ref >90)
GFR calc non Af Amer: >90 mL/min (ref >90)
GLUCOSE: 190 mg/dL — AB (ref 70–99)
GLUCOSE: 242 mg/dL — AB (ref 70–99)
POTASSIUM: 3.8 meq/L (ref 3.7–5.3)
Potassium: 3.3 mEq/L — ABNORMAL LOW (ref 3.7–5.3)
Sodium: 135 mEq/L — ABNORMAL LOW (ref 137–147)
Sodium: 138 mEq/L (ref 137–147)

## 2014-08-30 LAB — TSH: TSH: 0.849 u[IU]/mL (ref 0.350–4.500)

## 2014-08-30 LAB — HEMOGLOBIN A1C
HEMOGLOBIN A1C: 17.7 % — AB (ref <5.7)
Mean Plasma Glucose: 461 mg/dL — ABNORMAL HIGH (ref <117)

## 2014-08-30 LAB — GLUCOSE, CAPILLARY
GLUCOSE-CAPILLARY: 285 mg/dL — AB (ref 70–99)
Glucose-Capillary: 268 mg/dL — ABNORMAL HIGH (ref 70–99)
Glucose-Capillary: 327 mg/dL — ABNORMAL HIGH (ref 70–99)
Glucose-Capillary: 275 mg/dL — ABNORMAL HIGH (ref 70–99)

## 2014-08-30 LAB — OSMOLALITY: Osmolality: 287 mOsm/kg (ref 275–300)

## 2014-08-30 MED ORDER — INSULIN ASPART 100 UNIT/ML ~~LOC~~ SOLN
0.0000 [IU] | Freq: Every day | SUBCUTANEOUS | Status: DC
  Administered 2014-08-30: 3 [IU] via SUBCUTANEOUS

## 2014-08-30 MED ORDER — POTASSIUM CHLORIDE CRYS ER 20 MEQ PO TBCR
20.0000 meq | EXTENDED_RELEASE_TABLET | Freq: Two times a day (BID) | ORAL | Status: DC
  Administered 2014-08-30: 20 meq via ORAL
  Filled 2014-08-30: qty 1

## 2014-08-30 MED ORDER — SODIUM CHLORIDE 0.45 % IV SOLN
INTRAVENOUS | Status: DC
  Administered 2014-08-30 (×2): via INTRAVENOUS

## 2014-08-30 MED ORDER — POTASSIUM CHLORIDE CRYS ER 20 MEQ PO TBCR
40.0000 meq | EXTENDED_RELEASE_TABLET | Freq: Once | ORAL | Status: AC
  Administered 2014-08-30: 40 meq via ORAL
  Filled 2014-08-30: qty 2

## 2014-08-30 MED ORDER — LISINOPRIL 10 MG PO TABS
10.0000 mg | ORAL_TABLET | Freq: Every day | ORAL | Status: DC
  Administered 2014-08-30 – 2014-08-31 (×2): 10 mg via ORAL
  Filled 2014-08-30 (×3): qty 1

## 2014-08-30 MED ORDER — INSULIN ASPART 100 UNIT/ML ~~LOC~~ SOLN
0.0000 [IU] | Freq: Three times a day (TID) | SUBCUTANEOUS | Status: DC
  Administered 2014-08-31 (×2): 11 [IU] via SUBCUTANEOUS

## 2014-08-30 MED ORDER — HYDRALAZINE HCL 20 MG/ML IJ SOLN: 5.0000 mg | Freq: Four times a day (QID) | INTRAMUSCULAR | Status: DC | PRN

## 2014-08-30 MED ORDER — INSULIN GLARGINE 100 UNIT/ML ~~LOC~~ SOLN
10.0000 [IU] | Freq: Every day | SUBCUTANEOUS | Status: DC
  Administered 2014-08-30: 10 [IU] via SUBCUTANEOUS
  Filled 2014-08-30 (×2): qty 0.1

## 2014-08-30 NOTE — H&P (Signed)
FMTS ATTENDING ADMISSION NOTE  Rebecca Josephs,MD  I have seen and examined this patient, reviewed their chart. I have discussed this patient with the resident. I agree with the resident's findings, assessment and care plan.  33 Y/O F with PMX HTN, DM ( I/II), HLD was seen at the ED for Batholing cyst which self drained, patient denies any complaints except for vulva swelling and pain upon arrival. It was found later that her glucose level was in the 700. Patient stated she does not take her Novolog regularly as prescribed, she didn't give reason for poor compliance.  No current facility-administered medications on file prior to encounter.    Current Outpatient Prescriptions on File Prior to Encounter   Medication  Sig  Dispense  Refill   .  glucose blood test strip  Use as instructed  50 each  12    Past Medical History   Diagnosis  Date   .  HTN (hypertension)  06/04/2012   .  Hyperlipidemia  06/04/2012   .  Diabetes mellitus  06/04/2012   .  HSV-2 infection    .  DKA (diabetic ketoacidoses)    .  Acute respiratory failure     Filed Vitals:    08/29/14 1730  08/29/14 1735  08/29/14 1947  08/30/14 0647   BP:  137/90  137/90  151/99  132/92   Pulse:  86  89  74  83   Temp:    98.6 F (37 C)  98.3 F (36.8 C)   TempSrc:    Oral    Resp:  Height:     (1.626 m)    Weight:    217 lb 6 oz (98.6 kg)    SpO2:  97%  98%  100%  99%    Exam:  Gen: Awake and alert, calm in bed, not in distress.  HEENT: EOMI, PERRLA.  Neuro: Grossly intact.  Resp: Air entry equal and clear B/L  CV: S1 S2 normal, no murmur. RRR.  Abd: Soft, NT/ND, BS+ and normal. Ext: No edema   A/P: 33 Y/O F with  1. Hyperglycemia: with no evidence of DKA.  Slight Ketone in urine.PH arterial of 7.322 now 7.365  S/P Fluid bolus in the ED with Lantus  Serum and Capillary glucose has since improved.  Start SSI and Novolog.  Monitor insulin requirement and titrate regimen as needed.  Continue IVF for  hydration.  BMet, TSH, A1C.   2. Bartholin cyst/Abscess: S/P self drainage.  Continue Doxycycline.  Pain control as needed.   3. HTN/HLD: Will benefit from ACEi and Statin.  Plan to start both upon D/C from the hospital.

## 2014-08-30 NOTE — Progress Notes (Signed)
Pt with BP 151/106 and CBG-285 and no orders for BP med or insulin at bedtime. Family Medicine resident paged and made aware. Orders noted for insulin at bedtime, Lisinopril  and PRN blood pressure med for SBP>160. Will continue to assess.

## 2014-08-30 NOTE — ED Provider Notes (Signed)
Medical screening examination/treatment/procedure(s) were performed by non-physician practitioner and as supervising physician I was immediately available for consultation/collaboration.   EKG Interpretation None        Rolan Bucco, MD 08/30/14 [phone]

## 2014-08-30 NOTE — Care Management Note (Signed)
CARE MANAGEMENT NOTE 08/30/2014  Patient:  Rebecca Rhodes,Rebecca Rhodes   Account Number:  [id number]  Date Initiated:  08/30/2014  Documentation initiated by:  HALL,ATIKA  Subjective/Objective Assessment:   hyperglycemia, hx of dm. noncompliance with insulin     Action/Plan:   home   Anticipated DC Date:  09/01/2014   Anticipated DC Plan:  HOME/SELF CARE      DC Planning Services  CM consult      Choice offered to / List presented to:             Status of service:  Completed, signed off Medicare Important Message given?   (If response is "NO", the following Medicare IM given date fields will be blank) Date Medicare IM given:   Medicare IM given by:   Date Additional Medicare IM given:   Additional Medicare IM given by:    Discharge Disposition:  HOME/SELF CARE  Per UR Regulation:    If discussed at Long Length of Stay Meetings, dates discussed:    Comments:  08/30/14 ATIKAHALLRNCM [phone] Received referral for medication needs. Spoke with patient who confirms she has Medicaid that covers her medications. States she has a 3 dollar copay. Spoke with patient's nurse to find out what medication needs are needed as patient has medication coverage. Nursing reports patient has issues with compliance for medications such as insulin. SPoke with patient to discuss reminders for taking her medications such as reminders in her smart phone and sticky notes in her bathroom and other places. Patient states she uses reminders on her phone but still does not remember. States she puts the insulin near her bed and still forgets. Asked her what her blood sugars have been running. She states she does not know because she does not have a glucometer as she has lost it. Informed her that she will need a prescription for another meter and strips to take to her pharmacy. Spoke with patient's nurse to ask for her to relay to MD that patient will need prescription for strips and glucometer upon discharge. Asked patient  whether she felt like could benefit from HHC RN services. She declined. States she does not need HHC RN. No further needs assessed. [email or web address] [phone]   

## 2014-08-30 NOTE — Progress Notes (Signed)
Family Medicine Teaching Service Daily Progress Note Intern Pager: [phone]  Patient name: Rebecca Rhodes Medical record number: [id number] Date of birth: 18-Jan-1981 Age: 33 y.o. Gender: female  Primary Care Provider: Tawni Carnes, MD Consultants: none Code Status: full  Pt Overview and Major Events to Date:   Assessment and Plan: Rebecca Rhodes is a 33 y.o. female presenting with a ruptured bartholin cyst who was found to be hyperglycemic with likely HHS. PMH is significant for T2DM, HTN, HLD, and HSV-2.   Hyperglycemic state, most likely HHS: Initially with CBGs in 700s now 260s after several fluid boluses and lantus.Patient reports non-compliance with insulin regimen, which is likely precipitant. Most recent HgA1c 14.9 in 12/2013. S/p 10u novolog in the ED. AG 16 this morning. Will cont to push fluids as PO intake picks up. - 1/2 NS @ 50/hr - mod SSI - CBGs ac & qhs - BMP later today  - pending 24 insulin requirement may need to add lantus tonight - F/u TSH- 0.849 -CM consult for med needs  Hyponatremia: On admission, Na 130. Corrected is 140. Now resolved  Bartholin cyst: Patient reports 2 day history of enlarging labial cyst that ruptured while in the ER. Exam findings consistent with this history. History of MRSA infection in the past. Chronic hyperglycemia is thought to be the cause of her recurrent abscess and boil formation.  - Doxycycline (9/18 - )  -warm compresses - underwear change and hygiene  Likely vaginal candidiasis: Patient reports 1 mo of vaginal itching similar to prior vaginal candidal infections. Has uncontrolled type II diabetes that would predispose her to yeast infections.  - s/p 1 dose  diflucan   HTN: BP elevated to 140s/100s while in the ER. Currently normotensive.  - Consider adding anti-hypertensive   HLD: Likely needs treatment with moderate-high intensity statin given comorbidities.   HSV-2: During last hospitalization in 12/2013 was treated for  active HSV-2 lesion with valacyclovir. Was not discharged on any suppressive medications.  - Consider adding chronic suppressive therapy   FEN/GI: Carb modified diet  Prophylaxis: SubQ heparin  Disposition: medication needs; further titration of insulin regimen (may need 70/30 given cost)  Subjective: Feels better this morning; agrees with plan for change of underwear  Objective: Temp:  [98.3 F (36.8 C)-98.6 F (37 C)] 98.3 F (36.8 C) (09/19 0647) Pulse Rate:  [74-105] 83 (09/19 0647) Resp:  [16-21] 18 (09/19 0647) BP: (132-171)/(90-111) 132/92 mmHg (09/19 0647) SpO2:  [95 %-100 %] 99 % (09/19 0647) Weight:  [217 lb 6 oz (98.6 kg)] 217 lb 6 oz (98.6 kg) (09/18 1947) Physical Exam: General: Well-appearing woman in NAD  HEENT: PERRLA. EOMI. Cardiovascular: RRR no m/r/g. No heaves, thrills.  Respiratory: CTAB no wheezes or crackles.  Abdomen: +bs. Soft, nontender, nondistended.  Extremities: No LE edema  Genitourinary: Tender, erythematous lesion of the left labia that appears to be draining. Malodorous discharge  Neuro: Alert and oriented x 3. No evidence of confusion or lethargy. 5/5 strength throughout. Sensation intact to light touch bilaterally.  Laboratory:  Recent Labs Lab 08/29/14 1624  WBC 7.0  HGB 13.3  HCT 40.7  PLT 227    Recent Labs Lab 08/29/14 1624 08/30/14 0215 08/30/14 0406  NA 130* 135* 138  K 4.2 3.8 3.3*  CL 94* 101 103  CO2 BUN 9 5* 5*  CREATININE 0.75 0.54 0.59  CALCIUM 8.9 8.1* 7.9*  GLUCOSE 738* 190* 242*    Upreg: negative  I-stat VBG: pH 7.441, pCO2  32.9, pO2 48, bicarb 22  UA (9/18)  Color, Urine: YELLOW  APPearance: HAZY (A)  Specific Gravity, Urine: 1.041 (H)  pH: 5.5  Glucose: >1000 (A)  Bilirubin Urine: NEGATIVE  Ketones, ur: 15 (A)  Protein: NEGATIVE  Urobilinogen, UA: 0.2  Nitrite: NEGATIVE  Leukocytes, UA: NEGATIVE  Hgb urine dipstick: TRACE (A)  WBC, UA: 3-6  RBC / HPF: 0-2  Squamous Epithelial / LPF:  MANY (A)  Bacteria, UA: FEW (A)   Charlane Ferretti, MD 08/30/2014, 8:19 AM PGY-2, [place] [facility] FPTS Intern pager: [phone], text pages welcome

## 2014-08-30 NOTE — Progress Notes (Signed)
FMTS ATTENDING ADMISSION NOTE  Wali Reinheimer,MD  I have seen and examined this patient, reviewed their chart. I have discussed this patient with the resident. I agree with the resident's findings, assessment and care plan.  33 Y/O F with PMX HTN, DM ( I/II), HLD was seen at the ED for Batholing cyst which self drained, patient denies any complaints except for vulva swelling and pain upon arrival. It was found later that her glucose level was in the 700. Patient stated she does not take her Novolog regularly as prescribed, she didn't give reason for poor compliance.  No current facility-administered medications on file prior to encounter.    Current Outpatient Prescriptions on File Prior to Encounter   Medication  Sig  Dispense  Refill   .  glucose blood test strip  Use as instructed  50 each  12    Past Medical History   Diagnosis  Date   .  HTN (hypertension)  06/04/2012   .  Hyperlipidemia  06/04/2012   .  Diabetes mellitus  06/04/2012   .  HSV-2 infection    .  DKA (diabetic ketoacidoses)    .  Acute respiratory failure     Filed Vitals:    08/29/14 1730  08/29/14 1735  08/29/14 1947  08/30/14 0647   BP:  137/90  137/90  151/99  132/92   Pulse:  86  89  74  83   Temp:    98.6 F (37 C)  98.3 F (36.8 C)   TempSrc:    Oral    Resp:  17  19  18  18   Height:    5' 4" (1.626 m)    Weight:    217 lb 6 oz (98.6 kg)    SpO2:  97%  98%  100%  99%    Exam:  Gen: Awake and alert, calm in bed, not in distress.  HEENT: EOMI, PERRLA.  Neuro: Grossly intact.  Resp: Air entry equal and clear B/L  CV: S1 S2 normal, no murmur. RRR.  Abd: Soft, NT/ND, BS+ and normal. Ext: No edema   A/P: 33 Y/O F with  1. Hyperglycemia: with no evidence of DKA.  Slight Ketone in urine.PH arterial of 7.322 now 7.365  S/P Fluid bolus in the ED with Lantus  Serum and Capillary glucose has since improved.  Start SSI and Novolog.  Monitor insulin requirement and titrate regimen as needed.  Continue IVF for  hydration.  BMet, TSH, A1C.   2. Bartholin cyst/Abscess: S/P self drainage.  Continue Doxycycline.  Pain control as needed.   3. HTN/HLD: Will benefit from ACEi and Statin.  Plan to start both upon D/C from the hospital.  

## 2014-08-31 DIAGNOSIS — I10 Essential (primary) hypertension: Secondary | ICD-10-CM

## 2014-08-31 DIAGNOSIS — L02429 Furuncle of limb, unspecified: Secondary | ICD-10-CM

## 2014-08-31 DIAGNOSIS — E119 Type 2 diabetes mellitus without complications: Secondary | ICD-10-CM

## 2014-08-31 LAB — GLUCOSE, CAPILLARY
GLUCOSE-CAPILLARY: 345 mg/dL — AB (ref 70–99)
Glucose-Capillary: 308 mg/dL — ABNORMAL HIGH (ref 70–99)

## 2014-08-31 MED ORDER — INSULIN GLARGINE 100 UNIT/ML ~~LOC~~ SOLN
20.0000 [IU] | Freq: Every day | SUBCUTANEOUS | Status: DC
Start: 2014-08-31 — End: 2014-08-31
  Filled 2014-08-31: qty 0.2

## 2014-08-31 MED ORDER — ATORVASTATIN CALCIUM 40 MG PO TABS: 40.0000 mg | ORAL_TABLET | Freq: Every day | ORAL | Status: DC

## 2014-08-31 MED ORDER — ATORVASTATIN CALCIUM 40 MG PO TABS
40.0000 mg | ORAL_TABLET | Freq: Every day | ORAL | Status: DC
  Filled 2014-08-31: qty 1

## 2014-08-31 MED ORDER — LISINOPRIL 10 MG PO TABS: 10.0000 mg | ORAL_TABLET | Freq: Every day | ORAL | Status: DC

## 2014-08-31 MED ORDER — INSULIN ASPART PROT & ASPART (70-30 MIX) 100 UNIT/ML ~~LOC~~ SUSP: 20.0000 [IU] | Freq: Two times a day (BID) | SUBCUTANEOUS | Status: DC

## 2014-08-31 MED ORDER — GLUCOSE BLOOD VI STRP: 1.0000 | ORAL_STRIP | Freq: Three times a day (TID) | Status: DC

## 2014-08-31 MED ORDER — DOXYCYCLINE HYCLATE 100 MG PO TABS: 100.0000 mg | ORAL_TABLET | Freq: Every day | ORAL | Status: DC

## 2014-08-31 MED ORDER — ACCU-CHEK NANO SMARTVIEW W/DEVICE KIT: 1.0000 | PACK | Freq: Three times a day (TID) | Status: DC

## 2014-08-31 NOTE — Discharge Instructions (Signed)

## 2014-08-31 NOTE — Discharge Summary (Signed)
[facility] Discharge Summary  Patient name: Rebecca Rhodes Medical record number: [id number] Date of birth: July 10, 1981 Age: 33 y.o. Gender: female Date of Admission: 08/29/2014  Date of Discharge: 08/31/2014  Admitting Physician: Andrena Mews, MD  Primary Care Provider: Tawanna Sat, MD Consultants: none  Indication for Hospitalization: hyperglycemia  Discharge Diagnoses/Problem List:  Patient Active Problem List   Diagnosis Date Noted  . Hyperglycemia without ketosis 08/29/2014  . Hyperglycemia due to type 2 diabetes mellitus 08/29/2014  . Hyperglycemia 12/23/2013  . 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 Yeast infection involving the vagina and surrounding area 10/22/2013  . Boil, leg 06/24/2013  . HSV-2 infection complicating pregnancy 86/76/7209  . Benign essential hypertension antepartum 02/18/2013  . Acute respiratory failure 10/24/2012  . DKA (diabetic ketoacidoses) 06/04/2012  . HTN (hypertension) 06/04/2012  . Hyperlipidemia 06/04/2012  . Diabetes mellitus 06/04/2012     Disposition: home  Discharge Condition: improved  Discharge Exam:  General: Well-appearing woman in NAD  HEENT: MMM, NCAT Cardiovascular: RRR no m/r/g. No heaves, thrills.  Respiratory: CTAB no wheezes or crackles.  Abdomen: +bs. Soft, nontender, nondistended.  Extremities: No LE edema  Neuro: Alert and oriented x 3. No evidence of confusion or lethargy.   Brief Hospital Course:  Cortney Beissel is a 33 y.o. female presenting with a ruptured bartholin cyst who was found to be hyperglycemic with likely HHS. PMH is significant for T2DM, HTN, HLD, and HSV-2.   Hyperglycemia: Initial glucose in the 700s without ketosis or hyperosmolality. Gradually trended down over admission with fluids and SQ insulin. Precipitated by noncompliance; patient reports she can't remember to take her insulin. Restarted previous regimen of novolog 70/30 on discharge. Also started on atorvastatin  for CV risk reduction.  Bartholin cyst: Patient reported a 2 day history of enlarging labial cyst that ruptured while in the ER. Exam findings were consistent with this history. 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 Chronic hyperglycemia is thought to be the cause of her recurrent abscess and boil formation. 7 day total course of doxycycline and warm compresses recommended.   Likely vaginal candidiasis: Patient reports 1 mo of vaginal itching similar to prior vaginal candidal infections. Has uncontrolled type II diabetes that would predispose her to yeast infections. She was treated with a single dose of diflucan during her stay.  HTN: She was started on lisinopril for blood pressure elevated to the 150s/100s during her stay.   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 Was not discharged on any suppressive medications. Consider adding chronic suppressive therapy if patient complains of recurrent outbreaks   Issues for Follow Up:  # Hyperglycemia: follow up compliance. She cannot remember to take insulin and may need some help strategizing or a come to jesus meeting to motivated her.  # HTN: Monitor BP on lisinopril and adjust regimen as needed  # Bartholin abscess and candida: inquire about improvement and consider re-examining for resolution.  # HSV2: Not on suppression. Consider starting if she has multiple outbreaks.  Significant Procedures: none  Significant Labs and Imaging:   Recent Labs Lab 08/29/14 1624  WBC 7.0  HGB 13.3  HCT 40.7  PLT 227    Recent Labs Lab 08/29/14 1624 08/30/14 0215 08/30/14 0406  NA 130* 135* 138  K 4.2 3.8 3.3*  CL 94* 101 103  CO2 $Re'21 22 19  'CLf$ GLUCOSE 738* 190* 242*  BUN 9 5* 5*  CREATININE 0.75 0.54 0.59  CALCIUM 8.9 8.1* 7.9*    Results/Tests Pending at Time of Discharge: none  Discharge Medications:    Medication List         ACCU-CHEK NANO SMARTVIEW W/DEVICE Kit   1 Device by Does not apply route 4 (four) times daily -  with meals and at bedtime.     atorvastatin 40 MG tablet  Commonly known as:  LIPITOR  Take 1 tablet (40 mg total) by mouth daily at 6 PM.     doxycycline 100 MG tablet  Commonly known as:  VIBRA-TABS  Take 1 tablet (100 mg total) by mouth daily.     glucose blood test strip  Commonly known as:  ACCU-CHEK SMARTVIEW  1 each by Other route 4 (four) times daily -  with meals and at bedtime.     insulin aspart protamine- aspart (70-30) 100 UNIT/ML injection  Commonly known as:  NOVOLOG MIX 70/30  Inject 0.2-0.45 mLs (20-45 Units total) into the skin 2 (two) times daily with a meal. 45 units in the morning, and 20 units before supper     lisinopril 10 MG tablet  Commonly known as:  PRINIVIL,ZESTRIL  Take 1 tablet (10 mg total) by mouth daily.        Discharge Instructions: Please refer to Patient Instructions section of EMR for full details.  Patient was counseled important signs and symptoms that should prompt return to medical care, changes in medications, dietary instructions, activity restrictions, and follow up appointments.   Follow-Up Appointments: Follow-up Information   Follow up with Tawanna Sat, MD. Schedule an appointment as soon as possible for a visit in 1 week.   Specialty:  Family Medicine   Contact information:   [place] [address] [phone]       Frazier Richards, MD 08/31/2014, 10:46 AM PGY-2, [place]

## 2014-08-31 NOTE — Discharge Summary (Signed)
FMTS ATTENDING  NOTE Rebecca Wojnar,MD I  have seen and examined this patient, reviewed their chart. I have discussed this patient with the resident. I agree with the resident's findings, assessment and care plan. 

## 2014-10-10 ENCOUNTER — Encounter: Payer: Self-pay | Admitting: General Practice

## 2014-10-13 ENCOUNTER — Encounter (HOSPITAL_COMMUNITY): Payer: Self-pay | Admitting: Emergency Medicine

## 2014-10-18 ENCOUNTER — Inpatient Hospital Stay (HOSPITAL_COMMUNITY)
Admission: EM | Admit: 2014-10-18 | Discharge: 2014-10-23 | DRG: 638 | Disposition: A | Discharge status: Home / Self Care | Payer: Medicaid Other | Attending: Family Medicine | Admitting: Family Medicine

## 2014-10-18 ENCOUNTER — Emergency Department (HOSPITAL_COMMUNITY): Payer: Medicaid Other

## 2014-10-18 ENCOUNTER — Encounter (HOSPITAL_COMMUNITY): Payer: Self-pay | Admitting: *Deleted

## 2014-10-18 DIAGNOSIS — R0602 Shortness of breath: Secondary | ICD-10-CM | POA: Diagnosis not present

## 2014-10-18 DIAGNOSIS — R000 Tachycardia, unspecified: Secondary | ICD-10-CM | POA: Diagnosis present

## 2014-10-18 DIAGNOSIS — E785 Hyperlipidemia, unspecified: Secondary | ICD-10-CM | POA: Diagnosis present

## 2014-10-18 DIAGNOSIS — Z9114 Patient's other noncompliance with medication regimen: Secondary | ICD-10-CM | POA: Diagnosis present

## 2014-10-18 DIAGNOSIS — Z823 Family history of stroke: Secondary | ICD-10-CM | POA: Diagnosis not present

## 2014-10-18 DIAGNOSIS — I10 Essential (primary) hypertension: Secondary | ICD-10-CM | POA: Diagnosis present

## 2014-10-18 DIAGNOSIS — E118 Type 2 diabetes mellitus with unspecified complications: Secondary | ICD-10-CM

## 2014-10-18 DIAGNOSIS — IMO0002 Reserved for concepts with insufficient information to code with codable children: Secondary | ICD-10-CM

## 2014-10-18 DIAGNOSIS — Z8249 Family history of ischemic heart disease and other diseases of the circulatory system: Secondary | ICD-10-CM

## 2014-10-18 DIAGNOSIS — Z880 Allergy status to penicillin: Secondary | ICD-10-CM | POA: Diagnosis not present

## 2014-10-18 DIAGNOSIS — N179 Acute kidney failure, unspecified: Secondary | ICD-10-CM | POA: Diagnosis present

## 2014-10-18 DIAGNOSIS — E1165 Type 2 diabetes mellitus with hyperglycemia: Secondary | ICD-10-CM

## 2014-10-18 DIAGNOSIS — E1310 Other specified diabetes mellitus with ketoacidosis without coma: Secondary | ICD-10-CM | POA: Diagnosis present

## 2014-10-18 DIAGNOSIS — Z794 Long term (current) use of insulin: Secondary | ICD-10-CM

## 2014-10-18 DIAGNOSIS — Z23 Encounter for immunization: Secondary | ICD-10-CM | POA: Diagnosis not present

## 2014-10-18 DIAGNOSIS — Z833 Family history of diabetes mellitus: Secondary | ICD-10-CM

## 2014-10-18 DIAGNOSIS — Z598 Other problems related to housing and economic circumstances: Secondary | ICD-10-CM

## 2014-10-18 DIAGNOSIS — E1110 Type 2 diabetes mellitus with ketoacidosis without coma: Secondary | ICD-10-CM | POA: Diagnosis present

## 2014-10-18 LAB — CBC WITH DIFFERENTIAL/PLATELET
Basophils Absolute: 0 10*3/uL (ref 0.0–0.1)
Basophils Relative: 0 % (ref 0–1)
EOS ABS: 0.1 10*3/uL (ref 0.0–0.7)
Eosinophils Relative: 1 % (ref 0–5)
HEMATOCRIT: 45.6 % (ref 36.0–46.0)
HEMOGLOBIN: 15 g/dL (ref 12.0–15.0)
LYMPHS ABS: 1.6 10*3/uL (ref 0.7–4.0)
Lymphocytes Relative: 16 % (ref 12–46)
MCH: 28.6 pg (ref 26.0–34.0)
MCHC: 32.9 g/dL (ref 30.0–36.0)
MCV: 87 fL (ref 78.0–100.0)
MONOS PCT: 5 % (ref 3–12)
Monocytes Absolute: 0.5 10*3/uL (ref 0.1–1.0)
Neutro Abs: 8.1 10*3/uL — ABNORMAL HIGH (ref 1.7–7.7)
Neutrophils Relative %: 78 % — ABNORMAL HIGH (ref 43–77)
Platelets: 306 10*3/uL (ref 150–400)
RBC: 5.24 MIL/uL — ABNORMAL HIGH (ref 3.87–5.11)
RDW: 13.6 % (ref 11.5–15.5)
WBC: 10.2 10*3/uL (ref 4.0–10.5)

## 2014-10-18 LAB — BASIC METABOLIC PANEL
ANION GAP: 25 — AB (ref 5–15)
ANION GAP: 22 — AB (ref 5–15)
Anion gap: 36 — ABNORMAL HIGH (ref 5–15)
BUN: 17 mg/dL (ref 6–23)
BUN: 12 mg/dL (ref 6–23)
BUN: 12 mg/dL (ref 6–23)
CHLORIDE: 104 meq/L (ref 96–112)
CO2: 7 meq/L — AB (ref 19–32)
CO2: 10 mEq/L — CL (ref 19–32)
CO2: 11 mEq/L — ABNORMAL LOW (ref 19–32)
CREATININE: 1.13 mg/dL — AB (ref 0.50–1.10)
Calcium: 9.1 mg/dL (ref 8.4–10.5)
Calcium: 8.2 mg/dL — ABNORMAL LOW (ref 8.4–10.5)
Calcium: 7.9 mg/dL — ABNORMAL LOW (ref 8.4–10.5)
Chloride: 91 mEq/L — ABNORMAL LOW (ref 96–112)
Chloride: 104 mEq/L (ref 96–112)
Creatinine, Ser: 0.78 mg/dL (ref 0.50–1.10)
Creatinine, Ser: 0.75 mg/dL (ref 0.50–1.10)
GFR calc Af Amer: 73 mL/min — ABNORMAL LOW (ref >90)
GFR calc Af Amer: >90 mL/min (ref >90)
GFR calc non Af Amer: 63 mL/min — ABNORMAL LOW (ref >90)
GLUCOSE: 569 mg/dL — AB (ref 70–99)
Glucose, Bld: 240 mg/dL — ABNORMAL HIGH (ref 70–99)
Glucose, Bld: 237 mg/dL — ABNORMAL HIGH (ref 70–99)
POTASSIUM: 4.3 meq/L (ref 3.7–5.3)
POTASSIUM: 5.9 meq/L — AB (ref 3.7–5.3)
Potassium: 4.9 mEq/L (ref 3.7–5.3)
Sodium: 134 mEq/L — ABNORMAL LOW (ref 137–147)
Sodium: 139 mEq/L (ref 137–147)
Sodium: 137 mEq/L (ref 137–147)

## 2014-10-18 LAB — URINALYSIS, ROUTINE W REFLEX MICROSCOPIC
Bilirubin Urine: NEGATIVE
Glucose, UA: >1000 mg/dL — AB
Ketones, ur: >80 mg/dL — AB
Nitrite: NEGATIVE
Protein, ur: 30 mg/dL — AB
SPECIFIC GRAVITY, URINE: 1.032 — AB (ref 1.005–1.030)
Urobilinogen, UA: 0.2 mg/dL (ref 0.0–1.0)
pH: 5 (ref 5.0–8.0)

## 2014-10-18 LAB — I-STAT ARTERIAL BLOOD GAS, ED
Acid-base deficit: 21 mmol/L — ABNORMAL HIGH (ref 0.0–2.0)
Bicarbonate: 5.6 mEq/L — ABNORMAL LOW (ref 20.0–24.0)
O2 Saturation: 97 %
PH ART: 7.16 — AB (ref 7.350–7.450)
PO2 ART: 117 mmHg — AB (ref 80.0–100.0)
Patient temperature: 98.6
TCO2: 6 mmol/L (ref 0–100)
pCO2 arterial: 15.8 mmHg — CL (ref 35.0–45.0)

## 2014-10-18 LAB — URINE MICROSCOPIC-ADD ON

## 2014-10-18 LAB — GLUCOSE, CAPILLARY
GLUCOSE-CAPILLARY: 300 mg/dL — AB (ref 70–99)
GLUCOSE-CAPILLARY: 223 mg/dL — AB (ref 70–99)
GLUCOSE-CAPILLARY: 201 mg/dL — AB (ref 70–99)
GLUCOSE-CAPILLARY: 195 mg/dL — AB (ref 70–99)
Glucose-Capillary: 270 mg/dL — ABNORMAL HIGH (ref 70–99)
Glucose-Capillary: 175 mg/dL — ABNORMAL HIGH (ref 70–99)
Glucose-Capillary: 178 mg/dL — ABNORMAL HIGH (ref 70–99)

## 2014-10-18 LAB — CBG MONITORING, ED
GLUCOSE-CAPILLARY: 465 mg/dL — AB (ref 70–99)
Glucose-Capillary: 586 mg/dL (ref 70–99)

## 2014-10-18 LAB — MRSA PCR SCREENING: MRSA by PCR: POSITIVE — AB

## 2014-10-18 LAB — POC URINE PREG, ED: PREG TEST UR: NEGATIVE

## 2014-10-18 MED ORDER — DEXTROSE-NACL 5-0.45 % IV SOLN: INTRAVENOUS | Status: DC

## 2014-10-18 MED ORDER — SODIUM CHLORIDE 0.9 % IV SOLN
INTRAVENOUS | Status: DC
  Administered 2014-10-18: 16:00:00 via INTRAVENOUS

## 2014-10-18 MED ORDER — SODIUM CHLORIDE 0.9 % IV BOLUS (SEPSIS)
1000.0000 mL | Freq: Once | INTRAVENOUS | Status: AC
  Administered 2014-10-18: 1000 mL via INTRAVENOUS

## 2014-10-18 MED ORDER — HYDRALAZINE HCL 20 MG/ML IJ SOLN
10.0000 mg | INTRAMUSCULAR | Status: DC | PRN
  Administered 2014-10-19 – 2014-10-23 (×3): 10 mg via INTRAVENOUS
  Filled 2014-10-18 (×4): qty 1

## 2014-10-18 MED ORDER — INSULIN REGULAR HUMAN 100 UNIT/ML IJ SOLN
INTRAMUSCULAR | Status: DC
  Administered 2014-10-18: 4.1 [IU]/h via INTRAVENOUS
  Filled 2014-10-18: qty 2.5

## 2014-10-18 MED ORDER — ONDANSETRON HCL 4 MG/2ML IJ SOLN: 4.0000 mg | Freq: Three times a day (TID) | INTRAMUSCULAR | Status: AC | PRN

## 2014-10-18 MED ORDER — HEPARIN SODIUM (PORCINE) 5000 UNIT/ML IJ SOLN
5000.0000 [IU] | Freq: Three times a day (TID) | INTRAMUSCULAR | Status: DC
  Administered 2014-10-18 – 2014-10-23 (×14): 5000 [IU] via SUBCUTANEOUS
  Filled 2014-10-18 (×20): qty 1

## 2014-10-18 MED ORDER — CHLORHEXIDINE GLUCONATE CLOTH 2 % EX PADS
6.0000 | MEDICATED_PAD | Freq: Every day | CUTANEOUS | Status: AC
  Administered 2014-10-19 – 2014-10-23 (×5): 6 via TOPICAL

## 2014-10-18 MED ORDER — SODIUM CHLORIDE 0.9 % IV SOLN
INTRAVENOUS | Status: DC
  Administered 2014-10-18: 4.8 [IU]/h via INTRAVENOUS
  Administered 2014-10-20: 06:00:00 via INTRAVENOUS
  Filled 2014-10-18: qty 2.5

## 2014-10-18 MED ORDER — ATORVASTATIN CALCIUM 40 MG PO TABS
40.0000 mg | ORAL_TABLET | Freq: Every day | ORAL | Status: DC
  Administered 2014-10-18 – 2014-10-23 (×6): 40 mg via ORAL
  Filled 2014-10-18 (×6): qty 1

## 2014-10-18 MED ORDER — SODIUM CHLORIDE 0.9 % IV SOLN
1000.0000 mL | Freq: Once | INTRAVENOUS | Status: AC
Start: 2014-10-18 — End: 2014-10-18
  Administered 2014-10-18: 1000 mL via INTRAVENOUS

## 2014-10-18 MED ORDER — DEXTROSE 50 % IV SOLN: 25.0000 mL | INTRAVENOUS | Status: DC | PRN

## 2014-10-18 MED ORDER — POTASSIUM CHLORIDE 10 MEQ/100ML IV SOLN
10.0000 meq | INTRAVENOUS | Status: AC
  Administered 2014-10-18 (×2): 10 meq via INTRAVENOUS
  Filled 2014-10-18: qty 100

## 2014-10-18 MED ORDER — SODIUM CHLORIDE 0.9 % IV SOLN
1000.0000 mL | INTRAVENOUS | Status: DC
  Administered 2014-10-18: 1000 mL via INTRAVENOUS

## 2014-10-18 MED ORDER — MORPHINE SULFATE 4 MG/ML IJ SOLN
4.0000 mg | Freq: Once | INTRAMUSCULAR | Status: AC
  Administered 2014-10-18: 4 mg via INTRAVENOUS
  Filled 2014-10-18: qty 1

## 2014-10-18 MED ORDER — SODIUM CHLORIDE 0.9 % IV SOLN
1000.0000 mL | Freq: Once | INTRAVENOUS | Status: AC
  Administered 2014-10-18: 1000 mL via INTRAVENOUS

## 2014-10-18 MED ORDER — ONDANSETRON HCL 4 MG/2ML IJ SOLN
4.0000 mg | Freq: Once | INTRAMUSCULAR | Status: AC
Start: 2014-10-18 — End: 2014-10-18
  Administered 2014-10-18: 4 mg via INTRAVENOUS
  Filled 2014-10-18: qty 2

## 2014-10-18 MED ORDER — MUPIROCIN 2 % EX OINT
1.0000 "application " | TOPICAL_OINTMENT | Freq: Two times a day (BID) | CUTANEOUS | Status: AC
  Administered 2014-10-18 – 2014-10-23 (×10): 1 via NASAL
  Filled 2014-10-18 (×3): qty 22

## 2014-10-18 MED ORDER — DEXTROSE-NACL 5-0.45 % IV SOLN
INTRAVENOUS | Status: DC
  Administered 2014-10-18 – 2014-10-21 (×7): via INTRAVENOUS

## 2014-10-18 MED ORDER — INFLUENZA VAC SPLIT QUAD 0.5 ML IM SUSY
0.5000 mL | PREFILLED_SYRINGE | INTRAMUSCULAR | Status: AC
  Administered 2014-10-19: 0.5 mL via INTRAMUSCULAR
  Filled 2014-10-18: qty 0.5

## 2014-10-18 NOTE — H&P (Signed)
[facility] History and Physical Service Pager: [phone]  Patient name: Rebecca Rhodes Medical record number: [id number] Date of birth: 07/21/1981 Age: 33 y.o. Gender: female  Primary Care Provider: Tawni CarnesWight, Andrew, MD Consultants: none Code Status: full  Chief Complaint: dyspnea and nausea  Assessment and Plan: Rebecca Rhodes is a 33 y.o. female presenting with DKA . PMH is significant for type 2 diabetes, hyperlipidemia, and hypertension  DKA - moderate with large gap, extremely low bicarbonate, and some somnolence on exam - uncontrolled type 2 diabetes with her last A1c being 17.7 in September of this year. - vital signs are stable, on exam she is slightly somnolent but easily responds to questions. - pH 7.16, potassium 4.9, anion gap 36, bicarbonate 7, greater than 80 ketones in her urine - likely due to medication noncompliance, urinalysis with small leukocytes but no symptoms of UTI so this is most likely representative of asymptomatic bacteriuria  - urine culture, few squams - Every 2 hour BMPs until her bicarbonate improves and then will decrease to Q4 - glucose stabilizer started in the ER, continue - Status post 2 L NS bolus in the ER, continueNS at 150 mLs/hrthe transition to D5 half NS when CBG less than 250 - Addition to subcutaneous insulin when gap is closed  HTN - Elevated in the ED, 186/11 at its highest, now 130/93 - hold home lisinopruil, out of it for 2 weeks - PRN hydralazine ordered  AKI - Baseline Cre 0.6, elevated to 1.13 today - Fluids, monitor BMP - Hold lisnopril for now  FEN/GI: NS at 150 ml/hr, NPO until out of the acute phase then carb modified diet Prophylaxis: subq heparin  Disposition: Stepdown for frequent lab checks and close monitoring  History of Present Illness: Rebecca Rhodes is a 33 y.o. female presenting with nausea, vomiting, and dyspnea for one days duration. She states that for the last 2 weeks she has  not had her medications including insulin and lisinopril. Yesterday she began feeling poorly states that she tolerated her food okay with only minimal nausea and then vomited at midnight with continued nausea after that.  She denies chest pain but does state that she feels short of breath. She was of breath worsens with exertion and began last night with her nausea. She denies any exertional dyspnea at baseline and orthopnea.  She also denies symptoms. UTI including dysuria, foul-smelling urine, abdominal pain, any back pain. She denies smoking. She states that she did not take her medicine because she was waiting until her next pain today to refill her medicine.  Review Of Systems: Per HPI, Otherwise 12 point review of systems was performed and was unremarkable.  Patient Active Problem List   Diagnosis Date Noted  . Hyperglycemia without ketosis 08/29/2014  . Hyperglycemia due to type 2 diabetes mellitus 08/29/2014  . Hyperglycemia 12/23/2013  . 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 Yeast infection involving the vagina and surrounding area 10/22/2013  . Boil, leg 06/24/2013  . 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 Benign essential hypertension antepartum 02/18/2013  . Acute respiratory failure 10/24/2012  . DKA (diabetic ketoacidoses) 06/04/2012  . HTN (hypertension) 06/04/2012  . Hyperlipidemia 06/04/2012  . Diabetes mellitus 06/04/2012   Past Medical History: Past Medical History  Diagnosis Date  . HTN (hypertension) 06/04/2012  . Hyperlipidemia 06/04/2012  . Diabetes mellitus 06/04/2012  . HSV-2 infection   . DKA (diabetic ketoacidoses)   . Acute respiratory failure    Past Surgical History: Past Surgical  History  Procedure Laterality Date  . Irrigation and debridement abscess  10/31/2012    Procedure: IRRIGATION AND DEBRIDEMENT ABSCESS;  Surgeon: Cherylynn RidgesJames O Wyatt, MD;  Location: [facility];  Service: General;  Laterality: Bilateral;  . Cesarean section N/A 08/08/2013     Procedure: CESAREAN SECTION;  Surgeon: Tereso NewcomerUgonna A Anyanwu, MD;  Location: [facility];  Service: Obstetrics;  Laterality: N/A;   Social History: History  Substance Use Topics  . Smoking status: Never Smoker   . Smokeless tobacco: Never Used  . Alcohol Use: No   Additional social history: Please also refer to relevant sections of EMR.  Family History: Family History  Problem Relation Age of Onset  . Hypertension Mother   . Stroke Mother   . Heart disease Father   . Diabetes Father   . Cancer Maternal Grandfather   . Hypertension Brother   . Diabetes Paternal Aunt   . Diabetes Paternal Uncle   . Diabetes Paternal Grandmother   . Diabetes Paternal Grandfather    Allergies and Medications: Allergies  Allergen Reactions  . Penicillins Rash   No current facility-administered medications on file prior to encounter.   Current Outpatient Prescriptions on File Prior to Encounter  Medication Sig Dispense Refill  . atorvastatin (LIPITOR) 40 MG tablet Take 1 tablet (40 mg total) by mouth daily at 6 PM. 30 tablet 11  . insulin aspart protamine- aspart (NOVOLOG MIX 70/30) (70-30) 100 UNIT/ML injection Inject 0.2-0.45 mLs (20-45 Units total) into the skin 2 (two) times daily with a meal. 45 units in the morning, and 20 units before supper 10 mL 11  . lisinopril (PRINIVIL,ZESTRIL) 10 MG tablet Take 1 tablet (10 mg total) by mouth daily. 30 tablet 11    Objective: BP 167/92 mmHg  Pulse 90  Temp(Src) 97.5 F (36.4 C) (Oral)  Resp 24  Ht 5\' 4"  (1.626 m)  Wt 206 lb 14.4 oz (93.849 kg)  BMI 35.50 kg/m2  SpO2 99%  LMP 10/17/2014 Exam: Gen: NAD, appears drowsy,easily responds to questions HEENT: NCAT, EOMI, PERRL, injected conjunctiva bilaterally CV: slightly tachycardia but regular rhythm, good S1/S2, no murmur Resp: CTABL, no wheezes, non-labored Abd: SNTND, BS present, no guarding or organomegaly Ext: No edema, warm, 2+ DP pulses Neuro: Alert and oriented, EOMI, strength 5/5 and  sensation intact in all 4 extremities   Labs and Imaging: CBC BMET   Recent Labs Lab 10/18/14 1049  WBC 10.2  HGB 15.0  HCT 45.6  PLT 306    Recent Labs Lab 10/18/14 1049  NA 134*  K 4.9  CL 91*  CO2 7*  BUN 17  CREATININE 1.13*  GLUCOSE 569*  CALCIUM 9.1      Recent Labs Lab 10/18/14 1244  PHART 7.160*  PCO2ART 15.8*  PO2ART 117.0*  HCO3 5.6*  TCO2 6  O2SAT 97.0    urinalysis shows specific gravity 1.032, greater than 1000 glucose, greater than 80 ketones, 30 protein, small leukocytes, negative nitrites, large blood, 7-10 WBC per high per field, 21-50 RBC per high-power field, few squamous cells  negative urine pregnancy test  EKG 10/18/2014: Sinus tachycardia and LVH.  Chest x-ray 10/18/2014: IMPRESSION: No acute cardiopulmonary disease.  Elenora GammaSamuel L Auren Valdes, MD 10/18/2014, 1:53 PM PGY-3, [facility] FPTS Intern pager: [phone], text pages welcome

## 2014-10-18 NOTE — ED Notes (Signed)
Attempted report 

## 2014-10-18 NOTE — ED Provider Notes (Signed)
CSN: [id number]     Arrival date & time 10/18/14  1000 History   First MD Initiated Contact with Patient 10/18/14 1015     Chief Complaint  Patient presents with  . Shortness of Breath     (Consider location/radiation/quality/duration/timing/severity/associated sxs/prior Treatment) HPI   33 year old female with history of insulin-dependent diabetes, hypertension, prior DKA who presents for evaluation of shortness of breath.patient reports since yesterday she is having increased shortness of breath. She related to having a cold given that she is having some sore throat and occasional cough. Shortness of breath is worsening with exertion. Patient states she has been without her diabetic medication for the past to 3 weeks as well as her blood pressure medication. She admits to having polyuria and polydipsia. She denies any fever, severe headache, productive cough, hemoptysis, pleuritic chest pain, back pain, dysuria, or rash. No prior history of PE or DVT, no recent surgery, prolonged bed rest, birth control pill, unilateral leg swelling or calf pain. She has had similar episode requiring admission in the past.  Past Medical History  Diagnosis Date  . HTN (hypertension) 06/04/2012  . Hyperlipidemia 06/04/2012  . Diabetes mellitus 06/04/2012  . HSV-2 infection   . DKA (diabetic ketoacidoses)   . Acute respiratory failure    Past Surgical History  Procedure Laterality Date  . Irrigation and debridement abscess  10/31/2012    Procedure: IRRIGATION AND DEBRIDEMENT ABSCESS;  Surgeon: Gwenyth Ober, MD;  Location: [facility];  Service: General;  Laterality: Bilateral;  . Cesarean section N/A 08/08/2013    Procedure: CESAREAN SECTION;  Surgeon: Osborne Oman, MD;  Location: [facility];  Service: Obstetrics;  Laterality: N/A;   Family History  Problem Relation Age of Onset  . Hypertension Mother   . Stroke Mother   . Heart disease Father   . Diabetes Father   . Cancer Maternal Grandfather   .  Hypertension Brother   . Diabetes Paternal Aunt   . Diabetes Paternal Uncle   . Diabetes Paternal Grandmother   . Diabetes Paternal Grandfather    History  Substance Use Topics  . Smoking status: Never Smoker   . Smokeless tobacco: Never Used  . Alcohol Use: No   OB History    Gravida Para Term Preterm AB TAB SAB Ectopic Multiple Living   _0 Review of Systems  All other systems reviewed and are negative.     Allergies  Penicillins  Home Medications   Prior to Admission medications   Medication Sig Start Date End Date Taking? Authorizing Provider  atorvastatin (LIPITOR) 40 MG tablet Take 1 tablet (40 mg total) by mouth daily at 6 PM. 08/31/14   Frazier Richards, MD  Blood Glucose Monitoring Suppl (ACCU-CHEK NANO SMARTVIEW) W/DEVICE KIT 1 Device by Does not apply route 4 (four) times daily -  with meals and at bedtime. 08/31/14   Frazier Richards, MD  doxycycline (VIBRA-TABS) 100 MG tablet Take 1 tablet (100 mg total) by mouth daily. 08/31/14   Frazier Richards, MD  glucose blood (ACCU-CHEK SMARTVIEW) test strip 1 each by Other route 4 (four) times daily -  with meals and at bedtime. 08/31/14   Frazier Richards, MD  insulin aspart protamine- aspart (NOVOLOG MIX 70/30) (70-30) 100 UNIT/ML injection Inject 0.2-0.45 mLs (20-45 Units total) into the skin 2 (two) times daily with a meal. 45 units in the morning, and 20 units before supper  08/31/14   Frazier Richards, MD  lisinopril (PRINIVIL,ZESTRIL) 10 MG tablet Take 1 tablet (10 mg total) by mouth daily. 08/31/14   Frazier Richards, MD   BP 162/106 mmHg  Pulse 105  Temp(Src) 97.5 F (36.4 C) (Oral)  Resp 26  Ht _0  (1.626 m)  Wt 206 lb 14.4 oz (93.849 kg)  BMI 35.50 kg/m2  SpO2 99% Physical Exam  Constitutional: She is oriented to person, place, and time. She appears well-developed and well-nourished. No distress.  HENT:  Head: Atraumatic.  Mouth/Throat: Oropharynx is clear and moist. No oropharyngeal exudate.  Oral mucosa dry   Eyes: Conjunctivae are normal.  Neck: Neck supple.  No nuchal rigidity  Cardiovascular:  Mild tachycardia without murmurs rubs or gallops  Pulmonary/Chest: Effort normal and breath sounds normal. No respiratory distress. She has no wheezes. She exhibits no tenderness.  Abdominal: Soft. There is no tenderness.  Neurological: She is alert and oriented to person, place, and time. GCS eye subscore is 4. GCS verbal subscore is 5. GCS motor subscore is 6.  Patient is slow to response but able to answer questions appropriately.  Skin: No rash noted.  Psychiatric: She has a normal mood and affect.  Nursing note and vitals reviewed.   ED Course  Procedures (including critical care time)  10:35 AM Patient is here with symptoms suggestive of DKA versus hyperosmolar. She has been without her medication for the past several weeks which may have worsened her symptoms.she doesn't have any significant risk factor for PE aside from tachycardia. her initial CBG is 586. Workup initiated, IV fluid started. SOB likely [place] from metabolic acidosis.    1:13 PM Patient has an anion gap of 36 indicative of DKA. ABG demonstrated metabolic acidosis. Chest x-ray without acute finding, normal WBC, and pregnancy test is negative. Anticipate ketone in urine.  I have consulted Family Medicine Resident Dr. Wendi Snipes, who agrees to admit pt to step down, under the care care attending Dr. Dorcas Mcmurray.  Pt aware and agrees with plan.    CRITICAL CARE Performed by: Domenic Moras Total critical care time: 30 min Critical care time was exclusive of separately billable procedures and treating other patients. Critical care was necessary to treat or prevent imminent or life-threatening deterioration. Critical care was time spent personally by me on the following activities: development of treatment plan with patient and/or surrogate as well as nursing, discussions with consultants, evaluation of patient's response to treatment,  examination of patient, obtaining history from patient or surrogate, ordering and performing treatments and interventions, ordering and review of laboratory studies, ordering and review of radiographic studies, pulse oximetry and re-evaluation of patient's condition.    Labs Review Labs Reviewed  BASIC METABOLIC PANEL - Abnormal; Notable for the following:    Sodium 134 (*)    Chloride 91 (*)    CO2 7 (*)    Glucose, Bld 569 (*)    Creatinine, Ser 1.13 (*)    GFR calc non Af Amer 63 (*)    GFR calc Af Amer 73 (*)    Anion gap 36 (*)    All other components within normal limits  CBC WITH DIFFERENTIAL - Abnormal; Notable for the following:    RBC 5.24 (*)    Neutrophils Relative % 78 (*)    Neutro Abs 8.1 (*)    All other components within normal limits  CBG MONITORING, ED - Abnormal; Notable for the following:    Glucose-Capillary 586 (*)    All  other components within normal limits  I-STAT ARTERIAL BLOOD GAS, ED - Abnormal; Notable for the following:    pH, Arterial 7.160 (*)    pCO2 arterial 15.8 (*)    pO2, Arterial 117.0 (*)    Bicarbonate 5.6 (*)    Acid-base deficit 21.0 (*)    All other components within normal limits  CBG MONITORING, ED - Abnormal; Notable for the following:    Glucose-Capillary 465 (*)    All other components within normal limits  URINALYSIS, ROUTINE W REFLEX MICROSCOPIC  BLOOD GAS, ARTERIAL  POC URINE PREG, ED    Imaging Review Dg Chest 2 View  10/18/2014   CLINICAL DATA:  Shortness of breath, hypertension diabetes. Initial encounter.  EXAM: CHEST  2 VIEW  COMPARISON:  12/23/2013; 11/04/2012  FINDINGS: Grossly unchanged cardiac silhouette and mediastinal contours. No focal airspace opacities. No pleural effusion or pneumothorax. No evidence of edema. No acute osseus abnormalities.  IMPRESSION: No acute cardiopulmonary disease.   Electronically Signed   By: Sandi Mariscal M.D.   On: 10/18/2014 12:28     EKG Interpretation   Date/Time:  Saturday  October 18 2014 10:05:47 EST Ventricular Rate:  105 PR Interval:  146 QRS Duration: 86 QT Interval:  370 QTC Calculation: 489 R Axis:   42 Text Interpretation:  Sinus tachycardia Right atrial enlargement Left  ventricular hypertrophy Abnormal ECG SINCE LAST TRACING HEART RATE HAS  INCREASED Confirmed by Debby Freiberg [phone]) on 10/18/2014 10:29:39 AM      MDM   Final diagnoses:  SOB (shortness of breath)  Diabetic ketoacidosis without coma associated with type 2 diabetes mellitus    BP 164/95 mmHg  Pulse 88  Temp(Src) 97.5 F (36.4 C) (Oral)  Resp 27  Ht _0  (1.626 m)  Wt 206 lb 14.4 oz (93.849 kg)  BMI 35.50 kg/m2  SpO2 99%  LMP 10/17/2014  I have reviewed nursing notes and vital signs. I personally reviewed the imaging tests through PACS system  I reviewed available ER/hospitalization records thought the EMR     Domenic Moras, PA-C 10/18/14 [place], PA-C 10/18/14 [place], MD 10/18/14 [phone]

## 2014-10-18 NOTE — ED Notes (Signed)
Pt reports sob for several days, having n/v and non productive cough. Airway intact, ekg done at triage.

## 2014-10-18 NOTE — ED Notes (Signed)
CRITICAL CO2:7 Critical GLUCOSE: 569 Fayrene HelperBowie Tran PA aware.

## 2014-10-18 NOTE — ED Notes (Signed)
Transporting to 3S at this time.

## 2014-10-18 NOTE — Progress Notes (Signed)
CRITICAL VALUE ALERT  Critical value received:  CO2 10  Date of notification:  10/18/14  Time of notification:  1830  Critical value read back:Yes.    Nurse who received alert:  Buckner Maltaracie Donicia Druck  MD notified (1st page):  Dr. Ermalinda MemosBradshaw  Time of first page:  1832  MD notified (2nd page):  Time of second page:  Responding MD:  Dr. Ermalinda MemosBradshaw  Time MD responded:  [phone]

## 2014-10-18 NOTE — Progress Notes (Signed)
Placed pt on contact per MRSA positive  Lab.  Will continue to monitor. Rebecca Rhodes, [name]

## 2014-10-18 NOTE — ED Notes (Signed)
Pt back from x-ray.

## 2014-10-18 NOTE — ED Provider Notes (Signed)
Briefly, pt is a 33 y.o. female presenting with dyspnea, cough.  I performed an examination on the patient including cardiac, pulmonary, and gi systems which were remarkable for tachypnea, abd tenderness.  Labs with DKA.  Admitted in stable condition.  Mirian MoMatthew Gentry, MD 10/18/14 [phone]

## 2014-10-18 NOTE — ED Notes (Signed)
Patient transported to X-ray 

## 2014-10-19 DIAGNOSIS — E785 Hyperlipidemia, unspecified: Secondary | ICD-10-CM

## 2014-10-19 DIAGNOSIS — E1310 Other specified diabetes mellitus with ketoacidosis without coma: Principal | ICD-10-CM

## 2014-10-19 DIAGNOSIS — I10 Essential (primary) hypertension: Secondary | ICD-10-CM

## 2014-10-19 LAB — GLUCOSE, CAPILLARY
GLUCOSE-CAPILLARY: 150 mg/dL — AB (ref 70–99)
GLUCOSE-CAPILLARY: 156 mg/dL — AB (ref 70–99)
GLUCOSE-CAPILLARY: 158 mg/dL — AB (ref 70–99)
GLUCOSE-CAPILLARY: 162 mg/dL — AB (ref 70–99)
GLUCOSE-CAPILLARY: 162 mg/dL — AB (ref 70–99)
GLUCOSE-CAPILLARY: 164 mg/dL — AB (ref 70–99)
GLUCOSE-CAPILLARY: 164 mg/dL — AB (ref 70–99)
GLUCOSE-CAPILLARY: 165 mg/dL — AB (ref 70–99)
GLUCOSE-CAPILLARY: 169 mg/dL — AB (ref 70–99)
GLUCOSE-CAPILLARY: 170 mg/dL — AB (ref 70–99)
GLUCOSE-CAPILLARY: 180 mg/dL — AB (ref 70–99)
GLUCOSE-CAPILLARY: 180 mg/dL — AB (ref 70–99)
GLUCOSE-CAPILLARY: 191 mg/dL — AB (ref 70–99)
Glucose-Capillary: 123 mg/dL — ABNORMAL HIGH (ref 70–99)
Glucose-Capillary: 132 mg/dL — ABNORMAL HIGH (ref 70–99)
Glucose-Capillary: 133 mg/dL — ABNORMAL HIGH (ref 70–99)
Glucose-Capillary: 140 mg/dL — ABNORMAL HIGH (ref 70–99)
Glucose-Capillary: 156 mg/dL — ABNORMAL HIGH (ref 70–99)
Glucose-Capillary: 160 mg/dL — ABNORMAL HIGH (ref 70–99)
Glucose-Capillary: 160 mg/dL — ABNORMAL HIGH (ref 70–99)
Glucose-Capillary: 163 mg/dL — ABNORMAL HIGH (ref 70–99)
Glucose-Capillary: 166 mg/dL — ABNORMAL HIGH (ref 70–99)
Glucose-Capillary: 169 mg/dL — ABNORMAL HIGH (ref 70–99)
Glucose-Capillary: 170 mg/dL — ABNORMAL HIGH (ref 70–99)
Glucose-Capillary: 183 mg/dL — ABNORMAL HIGH (ref 70–99)

## 2014-10-19 LAB — BASIC METABOLIC PANEL
ANION GAP: 17 — AB (ref 5–15)
Anion gap: 17 — ABNORMAL HIGH (ref 5–15)
Anion gap: 17 — ABNORMAL HIGH (ref 5–15)
Anion gap: 18 — ABNORMAL HIGH (ref 5–15)
BUN: 4 mg/dL — ABNORMAL LOW (ref 6–23)
BUN: 6 mg/dL (ref 6–23)
BUN: 7 mg/dL (ref 6–23)
BUN: 8 mg/dL (ref 6–23)
CALCIUM: 8.2 mg/dL — AB (ref 8.4–10.5)
CALCIUM: 8.2 mg/dL — AB (ref 8.4–10.5)
CALCIUM: 7.9 mg/dL — AB (ref 8.4–10.5)
CHLORIDE: 106 meq/L (ref 96–112)
CHLORIDE: 106 meq/L (ref 96–112)
CO2: 12 mEq/L — ABNORMAL LOW (ref 19–32)
CO2: 13 mEq/L — ABNORMAL LOW (ref 19–32)
CO2: 14 mEq/L — ABNORMAL LOW (ref 19–32)
CO2: 15 mEq/L — ABNORMAL LOW (ref 19–32)
Calcium: 8.4 mg/dL (ref 8.4–10.5)
Chloride: 108 mEq/L (ref 96–112)
Chloride: 105 mEq/L (ref 96–112)
Creatinine, Ser: 0.55 mg/dL (ref 0.50–1.10)
Creatinine, Ser: 0.58 mg/dL (ref 0.50–1.10)
Creatinine, Ser: 0.61 mg/dL (ref 0.50–1.10)
Creatinine, Ser: 0.65 mg/dL (ref 0.50–1.10)
GFR calc Af Amer: >90 mL/min (ref >90)
GFR calc Af Amer: >90 mL/min (ref >90)
GFR calc non Af Amer: >90 mL/min (ref >90)
GFR calc non Af Amer: >90 mL/min (ref >90)
GFR calc non Af Amer: >90 mL/min (ref >90)
GLUCOSE: 168 mg/dL — AB (ref 70–99)
Glucose, Bld: 164 mg/dL — ABNORMAL HIGH (ref 70–99)
Glucose, Bld: 168 mg/dL — ABNORMAL HIGH (ref 70–99)
Glucose, Bld: 171 mg/dL — ABNORMAL HIGH (ref 70–99)
POTASSIUM: 3.7 meq/L (ref 3.7–5.3)
POTASSIUM: 2.9 meq/L — AB (ref 3.7–5.3)
Potassium: 3.5 mEq/L — ABNORMAL LOW (ref 3.7–5.3)
Potassium: 3.9 mEq/L (ref 3.7–5.3)
SODIUM: 138 meq/L (ref 137–147)
SODIUM: 135 meq/L — AB (ref 137–147)
Sodium: 137 mEq/L (ref 137–147)
Sodium: 138 mEq/L (ref 137–147)

## 2014-10-19 LAB — BASIC METABOLIC PANEL WITH GFR
Anion gap: 16 — ABNORMAL HIGH (ref 5–15)
BUN: 4 mg/dL — ABNORMAL LOW (ref 6–23)
CO2: 17 meq/L — ABNORMAL LOW (ref 19–32)
Calcium: 8.5 mg/dL (ref 8.4–10.5)
Chloride: 103 meq/L (ref 96–112)
Creatinine, Ser: 0.61 mg/dL (ref 0.50–1.10)
GFR calc Af Amer: >90 mL/min
GFR calc non Af Amer: >90 mL/min
Glucose, Bld: 159 mg/dL — ABNORMAL HIGH (ref 70–99)
Potassium: 3.5 meq/L — ABNORMAL LOW (ref 3.7–5.3)
Sodium: 136 meq/L — ABNORMAL LOW (ref 137–147)

## 2014-10-19 LAB — RAPID URINE DRUG SCREEN, HOSP PERFORMED
AMPHETAMINES: NOT DETECTED
BARBITURATES: NOT DETECTED
BENZODIAZEPINES: NOT DETECTED
COCAINE: NOT DETECTED
OPIATES: POSITIVE — AB
Tetrahydrocannabinol: NOT DETECTED

## 2014-10-19 MED ORDER — POTASSIUM CHLORIDE 10 MEQ/100ML IV SOLN
10.0000 meq | INTRAVENOUS | Status: AC
  Administered 2014-10-19 (×4): 10 meq via INTRAVENOUS
  Filled 2014-10-19 (×4): qty 100

## 2014-10-19 MED ORDER — LISINOPRIL 10 MG PO TABS
10.0000 mg | ORAL_TABLET | Freq: Every day | ORAL | Status: DC
  Administered 2014-10-19: 10 mg via ORAL
  Filled 2014-10-19: qty 1

## 2014-10-19 MED ORDER — ONDANSETRON HCL 4 MG/2ML IJ SOLN
4.0000 mg | Freq: Three times a day (TID) | INTRAMUSCULAR | Status: DC | PRN
  Administered 2014-10-19: 4 mg via INTRAVENOUS
  Filled 2014-10-19: qty 2

## 2014-10-19 MED ORDER — POTASSIUM CHLORIDE 10 MEQ/100ML IV SOLN
10.0000 meq | INTRAVENOUS | Status: AC
  Administered 2014-10-19 – 2014-10-20 (×6): 10 meq via INTRAVENOUS
  Filled 2014-10-19 (×6): qty 100

## 2014-10-19 MED ORDER — LISINOPRIL 20 MG PO TABS
20.0000 mg | ORAL_TABLET | Freq: Every day | ORAL | Status: DC
  Administered 2014-10-20 – 2014-10-21 (×2): 20 mg via ORAL
  Filled 2014-10-19 (×3): qty 1

## 2014-10-19 NOTE — Progress Notes (Signed)
Family Medicine Teaching Service Daily Progress Note Intern Pager: [phone]  Patient name: Rebecca Rhodes Medical record number: [id number] Date of birth: 03/12/1981 Age: 33 y.o. Gender: female  Primary Care Provider: Tawni CarnesWight, Andrew, MD Consultants: None Code Status: Full  Pt Overview and Major Events to Date:  10/18/14: Admitted to Hospital  Assessment and Plan: Rebecca Rhodes is a 33 y.o. female presenting with DKA . PMH is significant for type 2 diabetes, hyperlipidemia, and hypertension  # DKA - At admission, gap was 36, extremely low bicarbonate at 5.6, and some somnolence on exam. Ketones in urine >80. Glucose of 569. Has history of uncontrolled type 2 diabetes with her last A1c being 17.7 in September of this year. Out of insulin for 2 weeks. - Due to medication noncompliance. DDx includes infection. Urinalysis with small leukocytes but no symptoms of UTI so this is most likely representative of asymptomatic bacteriuria -F/U Urine Cx and UDS - Potassium 4.9 at admission>>3.9 - Bicarb 13 this morning. Continue to monitor - AG 17 this morning. Continue to monitor.   - Initiate lantus when closed x2  -Delta delta gap of 6 with ratio of 0.5 - CXR- no acute cardiopulmonary disease - BMP Q4 - Glucose stabilizer  - Received 2 L NS bolus in the ER. Was transitioned to NS @ 150 mLs/hr then transitioned to D5 half NS at 15725mL/hr  # HTN - Elevated in the ED, 186/111 at its highest, now 171/117 - Restart Lisinopril due to resolving AKI - Has been out of home medications for 2 weeks - PRN hydralazine ordered  # AKI- Baseline Cre 0.6. Elevated to 1.13 at admission. - 0.61 this monring - Fluids, monitor BMP  FEN/GI: D5halfNS @125 , NPO until out of the acute phase then carb modified diet Prophylaxis: subq heparin  [facility]  Subjective:  No acute complaints overnight. States she is feeling much better than yesterday.  Denies  abdominal pain or shortness of breath. No further complaints today.  Objective: Temp:  [97.5 F (36.4 C)-98.3 F (36.8 C)] 97.8 F (36.6 C) (11/08 0822) Pulse Rate:  [85-106] 92 (11/08 0822) Resp:  [19-27] 20 (11/08 0822) BP: (130-186)/(92-120) 171/117 mmHg (11/08 0822) SpO2:  [97 %-100 %] 100 % (11/08 0822) Weight:  [206 lb 14.4 oz (93.849 kg)-212 lb 8.4 oz (96.4 kg)] 212 lb 8.4 oz (96.4 kg) (11/07 1500)  Physical Exam: General: 33yo female resting comfortably in no apparent distress Cardiovascular: S1 and S2 noted. No murmurs/rubs/gallops. Respiratory: Clear to auscultation bilaterally. No wheezes/rales/rhonchi. Abdomen: Bowel sounds noted. Soft and non-distended. Non-tender. Extremities: No edema. Moves spontaneously.  Laboratory:  Recent Labs Lab 10/18/14 1049  WBC 10.2  HGB 15.0  HCT 45.6  PLT 306    Recent Labs Lab 10/18/14 1836 10/18/14 2330 10/19/14 0535  NA 137 137 138  K 5.9* 3.5* 3.9  CL 104 106 108  CO2 11* 14* 13*  BUN 12 8 7   CREATININE 0.75 0.65 0.61  CALCIUM 7.9* 7.9* 8.2*  GLUCOSE 237* 168* 164*  AG: 36>>25>>22>>17 Urine Ketones: >80 ABG    Component Value Date/Time   PHART 7.160* 10/18/2014 1244   PCO2ART 15.8* 10/18/2014 1244   PO2ART 117.0* 10/18/2014 1244   HCO3 5.6* 10/18/2014 1244   TCO2 6 10/18/2014 1244   ACIDBASEDEF 21.0* 10/18/2014 1244   O2SAT 97.0 10/18/2014 1244   Imaging/Diagnostic Tests: Dg Chest 2 View  10/18/2014   CLINICAL DATA:  Shortness of breath, hypertension diabetes. Initial encounter.  EXAM: CHEST  2 VIEW  COMPARISON:  12/23/2013; 11/04/2012  FINDINGS: Grossly unchanged cardiac silhouette and mediastinal contours. No focal airspace opacities. No pleural effusion or pneumothorax. No evidence of edema. No acute osseus abnormalities.  IMPRESSION: No acute cardiopulmonary disease.   Electronically Signed   By: Simonne ComeJohn  Watts M.D.   On: 10/18/2014 12:28   Rebecca BoucheRaleigh N Lamarcus Spira, DO 10/19/2014, 8:49 AM PGY-1, [place] [facility] FPTS Intern pager: [phone], text pages welcome

## 2014-10-19 NOTE — Progress Notes (Signed)
CRITICAL VALUE ALERT  Critical value received: Potassium 2.9  Date of notification:  10/19/14  Time of notification:  1730  Critical value read back:Yes.    Nurse who received alert:  Buckner Maltaracie Cailen Texeira   MD notified (1st page):  Family Medicine Resident  Time of first page:  1735  MD notified (2nd page):  Time of second page:  Responding MD: Caroleen Hammanumley  Time MD responded:  [phone]

## 2014-10-20 LAB — BASIC METABOLIC PANEL
ANION GAP: 15 (ref 5–15)
Anion gap: 15 (ref 5–15)
Anion gap: 15 (ref 5–15)
BUN: 3 mg/dL — ABNORMAL LOW (ref 6–23)
BUN: 3 mg/dL — AB (ref 6–23)
BUN: 8 mg/dL (ref 6–23)
CALCIUM: 8 mg/dL — AB (ref 8.4–10.5)
CHLORIDE: 109 meq/L (ref 96–112)
CO2: 15 mEq/L — ABNORMAL LOW (ref 19–32)
CO2: 16 meq/L — AB (ref 19–32)
CO2: 17 meq/L — AB (ref 19–32)
CREATININE: 0.66 mg/dL (ref 0.50–1.10)
Calcium: 8.1 mg/dL — ABNORMAL LOW (ref 8.4–10.5)
Calcium: 8.5 mg/dL (ref 8.4–10.5)
Chloride: 108 mEq/L (ref 96–112)
Chloride: 106 mEq/L (ref 96–112)
Creatinine, Ser: 0.62 mg/dL (ref 0.50–1.10)
Creatinine, Ser: 0.75 mg/dL (ref 0.50–1.10)
GFR calc Af Amer: >90 mL/min (ref >90)
GFR calc Af Amer: >90 mL/min (ref >90)
GFR calc non Af Amer: >90 mL/min (ref >90)
GFR calc non Af Amer: >90 mL/min (ref >90)
GFR calc non Af Amer: >90 mL/min (ref >90)
GLUCOSE: 147 mg/dL — AB (ref 70–99)
GLUCOSE: 148 mg/dL — AB (ref 70–99)
GLUCOSE: 148 mg/dL — AB (ref 70–99)
Potassium: 3.2 mEq/L — ABNORMAL LOW (ref 3.7–5.3)
Potassium: 3.4 mEq/L — ABNORMAL LOW (ref 3.7–5.3)
Potassium: 3.6 mEq/L — ABNORMAL LOW (ref 3.7–5.3)
SODIUM: 138 meq/L (ref 137–147)
SODIUM: 138 meq/L (ref 137–147)
Sodium: 140 mEq/L (ref 137–147)

## 2014-10-20 LAB — GLUCOSE, CAPILLARY
GLUCOSE-CAPILLARY: 155 mg/dL — AB (ref 70–99)
GLUCOSE-CAPILLARY: 135 mg/dL — AB (ref 70–99)
GLUCOSE-CAPILLARY: 142 mg/dL — AB (ref 70–99)
GLUCOSE-CAPILLARY: 244 mg/dL — AB (ref 70–99)
GLUCOSE-CAPILLARY: 234 mg/dL — AB (ref 70–99)
GLUCOSE-CAPILLARY: 213 mg/dL — AB (ref 70–99)
Glucose-Capillary: 133 mg/dL — ABNORMAL HIGH (ref 70–99)
Glucose-Capillary: 144 mg/dL — ABNORMAL HIGH (ref 70–99)
Glucose-Capillary: 145 mg/dL — ABNORMAL HIGH (ref 70–99)
Glucose-Capillary: 156 mg/dL — ABNORMAL HIGH (ref 70–99)
Glucose-Capillary: 158 mg/dL — ABNORMAL HIGH (ref 70–99)
Glucose-Capillary: 158 mg/dL — ABNORMAL HIGH (ref 70–99)
Glucose-Capillary: 166 mg/dL — ABNORMAL HIGH (ref 70–99)
Glucose-Capillary: 176 mg/dL — ABNORMAL HIGH (ref 70–99)
Glucose-Capillary: 195 mg/dL — ABNORMAL HIGH (ref 70–99)
Glucose-Capillary: 227 mg/dL — ABNORMAL HIGH (ref 70–99)
Glucose-Capillary: 233 mg/dL — ABNORMAL HIGH (ref 70–99)
Glucose-Capillary: 239 mg/dL — ABNORMAL HIGH (ref 70–99)
Glucose-Capillary: 353 mg/dL — ABNORMAL HIGH (ref 70–99)

## 2014-10-20 LAB — URINE CULTURE

## 2014-10-20 LAB — CBC
HCT: 40.8 % (ref 36.0–46.0)
HEMOGLOBIN: 13.9 g/dL (ref 12.0–15.0)
MCH: 28.8 pg (ref 26.0–34.0)
MCHC: 34.1 g/dL (ref 30.0–36.0)
MCV: 84.5 fL (ref 78.0–100.0)
Platelets: 250 10*3/uL (ref 150–400)
RBC: 4.83 MIL/uL (ref 3.87–5.11)
RDW: 13.8 % (ref 11.5–15.5)
WBC: 5.6 10*3/uL (ref 4.0–10.5)

## 2014-10-20 MED ORDER — INSULIN ASPART 100 UNIT/ML ~~LOC~~ SOLN: 0.0000 [IU] | Freq: Three times a day (TID) | SUBCUTANEOUS | Status: DC

## 2014-10-20 MED ORDER — INSULIN ASPART PROT & ASPART (70-30 MIX) 100 UNIT/ML ~~LOC~~ SUSP
20.0000 [IU] | Freq: Two times a day (BID) | SUBCUTANEOUS | Status: DC
  Administered 2014-10-20: 20 [IU] via SUBCUTANEOUS
  Filled 2014-10-20: qty 10

## 2014-10-20 NOTE — Progress Notes (Signed)
Family Medicine Teaching Service Daily Progress Note Intern Pager: [phone]  Patient name: Rebecca Rhodes Krizan Medical record number: [id number] Date of birth: 07/04/1981 Age: 33 y.o. Gender: female  Primary Care Provider: Tawni CarnesWight, Andrew, MD Consultants: None Code Status: Full  Pt Overview and Major Events to Date:  10/18/14: Admitted to Hospital  Assessment and Plan: Rebecca Rhodes Volland is a 33 y.o. female presenting with DKA . PMH is significant for type 2 diabetes, hyperlipidemia, and hypertension  # DKA - At admission, gap was 36, extremely low bicarbonate at 5.6, and some somnolence on exam. Ketones in urine >80. Glucose of 569. Has history of uncontrolled type 2 diabetes with her last A1c being 17.7 in September of this year. Out of insulin for 2 weeks. - Due to medication noncompliance. DDx includes infection. Urinalysis with small leukocytes but no symptoms of UTI so this is most likely representative of asymptomatic bacteriuria - Urine Cx-   - UDS- positive for Opioids - Potassium 4.9 at admission>>3.9>>2.9>>3.5>>3.2  - Received 6 runs of 10mEq of potassium chloride - Bicarb 15 this morning. Continue to monitor - AG 15 this morning x2. Continue to monitor.   - Will initiate Subcutaneous Insulin (Novolog 70/30) at afternoon meal  - Will overlap subcutaneous insulin and insulin drip for 1-2 hours before discontinuing insulin drip  -Delta delta gap of 6 with ratio of 0.5 - CXR- no acute cardiopulmonary disease - BMP Q4 - Glucose stabilizer  - Currently on D5 half NS at 13125mL/hr  # HTN - Elevated in the ED, 186/111 at its highest, now 128/82 - Restart Lisinopril due to resolving AKI - Has been out of home medications for 2 weeks - PRN hydralazine ordered  # AKI- Baseline Cre 0.6. Elevated to 1.13 at admission. - 0.62 this monring - Fluids, monitor BMP  FEN/GI: D5halfNS @125 , Carb modified diet Prophylaxis: subq heparin  [facility]: [facility]  Subjective:  No acute complaints overnight. Denies abdominal pain or shortness of breath. States she is feeling much better. No further complaints today.  Objective: Temp:  [97.6 F (36.4 C)-98.6 F (37 C)] 98.5 F (36.9 C) (11/09 0746) Pulse Rate:  [87-107] 87 (11/09 0334) Resp:  [14-26] 20 (11/09 0334) BP: (128-160)/(82-98) 128/82 mmHg (11/09 0334) SpO2:  [100 %] 100 % (11/09 0334)  Physical Exam: General: 33yo female resting comfortably in no apparent distress Cardiovascular: S1 and S2 noted. No murmurs/rubs/gallops. Respiratory: Clear to auscultation bilaterally. No wheezes/rales/rhonchi. Abdomen: Bowel sounds noted. Soft and non-distended. Non-tender. Extremities: No edema. Moves spontaneously.  Laboratory:  Recent Labs Lab 10/18/14 1049 10/20/14 0553  WBC 10.2 5.6  HGB 15.0 13.9  HCT 45.6 40.8  PLT 306 250    Recent Labs Lab 10/19/14 1629 10/19/14 2054 10/20/14 0553  NA 138 136* 138  K 2.9* 3.5* 3.2*  CL 106 103 108  CO2 15* 17* 15*  BUN 4* 4* 3*  CREATININE 0.58 0.61 0.62  CALCIUM 8.4 8.5 8.0*  GLUCOSE 171* 159* 147*  AG: 36>>25>>22>>17 Urine Ketones: >80 ABG    Component Value Date/Time   PHART 7.160* 10/18/2014 1244   PCO2ART 15.8* 10/18/2014 1244   PO2ART 117.0* 10/18/2014 1244   HCO3 5.6* 10/18/2014 1244   TCO2 6 10/18/2014 1244   ACIDBASEDEF 21.0* 10/18/2014 1244   O2SAT 97.0 10/18/2014 1244   Imaging/Diagnostic Tests: Dg Chest 2 View  10/18/2014   CLINICAL DATA:  Shortness of breath, hypertension diabetes. Initial encounter.  EXAM: CHEST  2 VIEW  COMPARISON:  12/23/2013;  11/04/2012  FINDINGS: Grossly unchanged cardiac silhouette and mediastinal contours. No focal airspace opacities. No pleural effusion or pneumothorax. No evidence of edema. No acute osseus abnormalities.  IMPRESSION: No acute cardiopulmonary disease.   Electronically Signed   By: Simonne ComeJohn  Watts M.D.   On: 10/18/2014 12:28   Araceli BoucheRaleigh N Carl Bleecker, DO 10/20/2014, 8:39  AM PGY-1, [place] [facility] FPTS Intern pager: [phone], text pages welcome

## 2014-10-20 NOTE — Care Management Note (Signed)
    Page 1 of 1   10/20/2014     3:34:39 PM CARE MANAGEMENT NOTE 10/20/2014  Patient:  Rebecca Rhodes,Rebecca Rhodes   Account Number:  [id number]  Date Initiated:  10/20/2014  Documentation initiated by:  Donn PieriniWEBSTER,Miracle Criado  Subjective/Objective Assessment:   Pt admitted with DKA     Action/Plan:   PTA pt lived at home with family- anticipate return home when medically stable   Anticipated DC Date:  10/22/2014   Anticipated DC Plan:  HOME/SELF CARE      DC Planning Services  CM consult      Choice offered to / List presented to:             Status of service:  In process, will continue to follow Medicare Important Message given?  NO (If response is "NO", the following Medicare IM given date fields will be blank) Date Medicare IM given:   Medicare IM given by:   Date Additional Medicare IM given:   Additional Medicare IM given by:    Discharge Disposition:    Per UR Regulation:  Reviewed for med. necessity/level of care/duration of stay  If discussed at Long Length of Stay Meetings, dates discussed:    Comments:  10/20/14- 1530- Donn PieriniKristi Zakyla Tonche RN, BSN [phone] Received referral for medication needs- spoke with pt at bedside- per conversation pt states that she just didn't have the money for copay for meds - she has medicaid and copay cost is $3- she reports that she typically doesn't have a problem but this last time just didn't have it and ran out of insulin. Since pt has Medicaid she does not qualify for any assistance with medication- she reports that she will have the money for copay at discharge and will have transportation home. PCP- is WIGHT, ANDREW M-- no further NCM needs noted at this time

## 2014-10-21 DIAGNOSIS — E1110 Type 2 diabetes mellitus with ketoacidosis without coma: Secondary | ICD-10-CM | POA: Insufficient documentation

## 2014-10-21 LAB — GLUCOSE, CAPILLARY
GLUCOSE-CAPILLARY: 331 mg/dL — AB (ref 70–99)
GLUCOSE-CAPILLARY: 328 mg/dL — AB (ref 70–99)
Glucose-Capillary: 163 mg/dL — ABNORMAL HIGH (ref 70–99)
Glucose-Capillary: 224 mg/dL — ABNORMAL HIGH (ref 70–99)
Glucose-Capillary: 339 mg/dL — ABNORMAL HIGH (ref 70–99)
Glucose-Capillary: 297 mg/dL — ABNORMAL HIGH (ref 70–99)

## 2014-10-21 LAB — BASIC METABOLIC PANEL
Anion gap: 14 (ref 5–15)
Anion gap: 15 (ref 5–15)
Anion gap: 13 (ref 5–15)
BUN: 9 mg/dL (ref 6–23)
BUN: 6 mg/dL (ref 6–23)
BUN: 7 mg/dL (ref 6–23)
CHLORIDE: 105 meq/L (ref 96–112)
CHLORIDE: 104 meq/L (ref 96–112)
CHLORIDE: 103 meq/L (ref 96–112)
CO2: 18 meq/L — AB (ref 19–32)
CO2: 17 meq/L — AB (ref 19–32)
CO2: 20 meq/L (ref 19–32)
CREATININE: 0.73 mg/dL (ref 0.50–1.10)
Calcium: 8.3 mg/dL — ABNORMAL LOW (ref 8.4–10.5)
Calcium: 8.2 mg/dL — ABNORMAL LOW (ref 8.4–10.5)
Calcium: 8.4 mg/dL (ref 8.4–10.5)
Creatinine, Ser: 0.8 mg/dL (ref 0.50–1.10)
Creatinine, Ser: 0.7 mg/dL (ref 0.50–1.10)
GFR calc Af Amer: >90 mL/min (ref >90)
GFR calc non Af Amer: >90 mL/min (ref >90)
GFR calc non Af Amer: >90 mL/min (ref >90)
GFR calc non Af Amer: >90 mL/min (ref >90)
GLUCOSE: 235 mg/dL — AB (ref 70–99)
GLUCOSE: 342 mg/dL — AB (ref 70–99)
Glucose, Bld: 344 mg/dL — ABNORMAL HIGH (ref 70–99)
POTASSIUM: 3.4 meq/L — AB (ref 3.7–5.3)
POTASSIUM: 3.8 meq/L (ref 3.7–5.3)
Potassium: 3.7 mEq/L (ref 3.7–5.3)
Sodium: 137 mEq/L (ref 137–147)
Sodium: 136 mEq/L — ABNORMAL LOW (ref 137–147)
Sodium: 136 mEq/L — ABNORMAL LOW (ref 137–147)

## 2014-10-21 MED ORDER — POTASSIUM CHLORIDE CRYS ER 20 MEQ PO TBCR
40.0000 meq | EXTENDED_RELEASE_TABLET | Freq: Once | ORAL | Status: AC
  Administered 2014-10-21: 40 meq via ORAL
  Filled 2014-10-21: qty 2

## 2014-10-21 MED ORDER — INSULIN ASPART PROT & ASPART (70-30 MIX) 100 UNIT/ML ~~LOC~~ SUSP
45.0000 [IU] | Freq: Every day | SUBCUTANEOUS | Status: DC
  Filled 2014-10-21: qty 10

## 2014-10-21 MED ORDER — INSULIN ASPART PROT & ASPART (70-30 MIX) 100 UNIT/ML ~~LOC~~ SUSP
45.0000 [IU] | Freq: Every day | SUBCUTANEOUS | Status: DC
  Administered 2014-10-21 – 2014-10-22 (×2): 45 [IU] via SUBCUTANEOUS

## 2014-10-21 MED ORDER — INSULIN ASPART PROT & ASPART (70-30 MIX) 100 UNIT/ML ~~LOC~~ SUSP
20.0000 [IU] | Freq: Every day | SUBCUTANEOUS | Status: DC
  Administered 2014-10-21: 20 [IU] via SUBCUTANEOUS

## 2014-10-21 NOTE — Plan of Care (Signed)
Problem: Phase I Progression Outcomes Goal: Diabetes Coordinator Consult Outcome: Completed/Met Date Met:  10/21/14

## 2014-10-21 NOTE — Clinical Documentation Improvement (Signed)
Presents with Etoh Gastritis and Esophagitis; Protein Calorie Malnutrition documented in 11/9 progress note.  "Underweight" noted by Nutritional Services 17% weight loss with no time frame documented Patient is 6' tall weighing 134 pounds BMI of 18 History of Alcohol Abuse and Dysphagia Resource Breeze ordered  Please clarify the acuity of "protein calorie malnutrition": Severe                         Moderate                         Mild                        Other    Thank You, Azaylah Stailey T Aerik Polan ,RN Clinical Documentation Specialist:  [phone]  [place]- Health Information Management 

## 2014-10-21 NOTE — Progress Notes (Signed)
Family Medicine Teaching Service Daily Progress Note Intern Pager: [phone]  Patient name: Rebecca Rhodes Medical record number: [id number] Date of birth: 03/28/1981 Age: 33 y.o. Gender: female  Primary Care Provider: Tawni CarnesWight, Andrew, MD Consultants: None Code Status: Full  Pt Overview and Major Events to Date:  10/18/14: Admitted to Hospital  Assessment and Plan: Rebecca SiKeisha Bussie is a 33 y.o. female presenting with DKA . PMH is significant for type 2 diabetes, hyperlipidemia, and hypertension  # DKA - At admission, gap was 36, extremely low bicarbonate at 5.6, and some somnolence on exam. Ketones in urine >80. Glucose of 569. Has history of uncontrolled type 2 diabetes with her last A1c being 17.7 in September of this year. Out of insulin for 2 weeks. - Due to medication noncompliance. DDx includes infection. Urinalysis with small leukocytes but no symptoms of UTI so this is most likely representative of asymptomatic bacteriuria - Urine Cx-   - UDS- positive for Opioids - Potassium 4.9 at admission. 3.8 this morning  - Received 6 runs of 10mEq of potassium chloride - Bicarb 15 this morning. Continue to monitor - Transitioned from insulin drip to subcutaneous insulin (Novolog 70/30 20U with ISS)  - Given 20Units Novolog last night. Will monitor ISS dose required today and calculate tomorrow's dose - AG 15 this morning - CXR- no acute cardiopulmonary disease - Glucose stabilizer  - Currently on D5 half NS at 17825mL/hr  # HTN - Elevated in the ED, 186/111 at its highest, now 134/76 - Restart Lisinopril due to resolving AKI - Has been out of home medications for 2 weeks - PRN hydralazine ordered  # AKI- Baseline Cre 0.6. Elevated to 1.13 at admission. - 0.73 this monring - Fluids, monitor BMP  FEN/GI: D5halfNS @125 , Carb modified diet Prophylaxis: subq heparin  [facility]: [facility]  Subjective:  No acute complaints overnight. Denies  abdominal pain or any other complaints today. Agrees with plan for subcutaneous insulin today.  Objective: Temp:  [98 F (36.7 C)-98.7 F (37.1 C)] 98 F (36.7 C) (11/10 0346) Pulse Rate:  [79-95] 80 (11/10 0400) Resp:  [13-24] 21 (11/10 0400) BP: (132-139)/(76-94) 134/76 mmHg (11/10 0346) SpO2:  [99 %-100 %] 99 % (11/10 0400)  Physical Exam: General: 33yo female resting comfortably in no apparent distress Cardiovascular: S1 and S2 noted. No murmurs/rubs/gallops. Respiratory: Clear to auscultation bilaterally. No wheezes/rales/rhonchi. Abdomen: Bowel sounds noted. Soft and non-distended. Non-tender. Extremities: No edema. Moves spontaneously.  Laboratory:  Recent Labs Lab 10/18/14 1049 10/20/14 0553  WBC 10.2 5.6  HGB 15.0 13.9  HCT 45.6 40.8  PLT 306 250    Recent Labs Lab 10/20/14 1909 10/20/14 2317 10/21/14 0528  NA 138 137 136*  K 3.6* 3.4* 3.8  CL 106 105 104  CO2 17* 18* 17*  BUN 8 9 6   CREATININE 0.75 0.80 0.73  CALCIUM 8.5 8.3* 8.2*  GLUCOSE 148* 235* 342*  AG: 36>>25>>22>>17 Urine Ketones: >80 ABG    Component Value Date/Time   PHART 7.160* 10/18/2014 1244   PCO2ART 15.8* 10/18/2014 1244   PO2ART 117.0* 10/18/2014 1244   HCO3 5.6* 10/18/2014 1244   TCO2 6 10/18/2014 1244   ACIDBASEDEF 21.0* 10/18/2014 1244   O2SAT 97.0 10/18/2014 1244   Imaging/Diagnostic Tests: Dg Chest 2 View  10/18/2014   CLINICAL DATA:  Shortness of breath, hypertension diabetes. Initial encounter.  EXAM: CHEST  2 VIEW  COMPARISON:  12/23/2013; 11/04/2012  FINDINGS: Grossly unchanged cardiac silhouette and mediastinal contours. No focal airspace  opacities. No pleural effusion or pneumothorax. No evidence of edema. No acute osseus abnormalities.  IMPRESSION: No acute cardiopulmonary disease.   Electronically Signed   By: Simonne ComeJohn  Watts M.D.   On: 10/18/2014 12:28   Araceli BoucheRaleigh N Delissa Silba, DO 10/21/2014, 8:13 AM PGY-1, [place] [facility] FPTS Intern pager: [phone], text  pages welcome

## 2014-10-21 NOTE — Progress Notes (Addendum)
Inpatient Diabetes Program Recommendations  AACE/ADA: New Consensus Statement on Inpatient Glycemic Control (2013)  Target Ranges:  Prepandial:   less than 140 mg/dL      Peak postprandial:   less than 180 mg/dL (1-2 hours)      Critically ill patients:  140 - 180 mg/dL    Results for Rebecca Rhodes, Rebecca Rhodes (MRN [id number]) as of 10/21/2014 07:36  Ref. Range 10/21/2014 05:28  Sodium Latest Range: 137-147 mEq/L 136 (L)  Potassium Latest Range: 3.7-5.3 mEq/L 3.8  Chloride Latest Range: 96-112 mEq/L 104  CO2 Latest Range: 19-32 mEq/L 17 (L)  BUN Latest Range: 6-23 mg/dL 6  Creatinine Latest Range: 0.50-1.10 mg/dL 4.090.73  Calcium Latest Range: 8.4-10.5 mg/dL 8.2 (L)  GFR calc non Af Amer Latest Range: >90 mL/min >90  GFR calc Af Amer Latest Range: >90 mL/min >90  Glucose Latest Range: 70-99 mg/dL 811342 (H)  Anion gap Latest Range: 5-15  15    Patient admitted with DKA.  Ran out of insulin for 2 weeks.  Per Donn PieriniKristi Webster (care manager) note: "spoke with pt at bedside- per conversation pt states that she just didn't have the money for copay for meds - she has medicaid and copay cost is $3- she reports that she typically doesn't have a problem but this last time just didn't have it and ran out of insulin. Since pt has Medicaid she does not qualify for any assistance with medication- she reports that she will have the money for copay at discharge and will have transportation home."  Note patient was transitioned off IV insulin drip last evening to 70/30 insulin + Novolog Moderate SSI.  Patient may likely need more 70/30 insulin as her home doses are listed as 70/30 insulin- 45 units AM/ 20 units evening.   Addendum 1040am: Spoke with patient this AM.  Patient told me she now has the money to pick up her insulin Rxs after d/c.  Pt was very distracted during our conversation (husband and infant baby were in room). Patient told me she "forgets" to check her CBGs at home sometimes.  Discussed poor blood sugar  control with patient and reminded patient about the long-term consequences of poor blood sugar control.  Encouraged patient to check her CBGs QID at home and to take her logbook with her [facility].        Will follow Ambrose FinlandJeannine Johnston Zedekiah Hinderman RN, MSN, CDE Diabetes Coordinator Inpatient Diabetes Program Team Pager: [phone] (8a-10p)

## 2014-10-22 LAB — GLUCOSE, CAPILLARY
GLUCOSE-CAPILLARY: 297 mg/dL — AB (ref 70–99)
GLUCOSE-CAPILLARY: 289 mg/dL — AB (ref 70–99)
Glucose-Capillary: 249 mg/dL — ABNORMAL HIGH (ref 70–99)
Glucose-Capillary: 291 mg/dL — ABNORMAL HIGH (ref 70–99)

## 2014-10-22 LAB — BASIC METABOLIC PANEL
Anion gap: 13 (ref 5–15)
BUN: 6 mg/dL (ref 6–23)
CALCIUM: 8.2 mg/dL — AB (ref 8.4–10.5)
CHLORIDE: 104 meq/L (ref 96–112)
CO2: 21 meq/L (ref 19–32)
Creatinine, Ser: 0.63 mg/dL (ref 0.50–1.10)
GFR calc Af Amer: >90 mL/min (ref >90)
GFR calc non Af Amer: >90 mL/min (ref >90)
Glucose, Bld: 227 mg/dL — ABNORMAL HIGH (ref 70–99)
Potassium: 3.3 mEq/L — ABNORMAL LOW (ref 3.7–5.3)
Sodium: 138 mEq/L (ref 137–147)

## 2014-10-22 MED ORDER — INSULIN ASPART PROT & ASPART (70-30 MIX) 100 UNIT/ML ~~LOC~~ SUSP
25.0000 [IU] | Freq: Every day | SUBCUTANEOUS | Status: DC
  Administered 2014-10-22 – 2014-10-23 (×2): 25 [IU] via SUBCUTANEOUS

## 2014-10-22 MED ORDER — INSULIN ASPART PROT & ASPART (70-30 MIX) 100 UNIT/ML ~~LOC~~ SUSP
50.0000 [IU] | Freq: Every day | SUBCUTANEOUS | Status: DC
  Administered 2014-10-23: 50 [IU] via SUBCUTANEOUS

## 2014-10-22 MED ORDER — POTASSIUM CHLORIDE CRYS ER 20 MEQ PO TBCR
40.0000 meq | EXTENDED_RELEASE_TABLET | Freq: Two times a day (BID) | ORAL | Status: AC
  Administered 2014-10-22 (×2): 40 meq via ORAL
  Filled 2014-10-22 (×2): qty 2

## 2014-10-22 MED ORDER — LISINOPRIL 40 MG PO TABS
40.0000 mg | ORAL_TABLET | Freq: Every day | ORAL | Status: DC
  Administered 2014-10-23: 40 mg via ORAL
  Filled 2014-10-22: qty 1

## 2014-10-22 NOTE — Progress Notes (Signed)
Family Medicine Teaching Service Daily Progress Note Intern Pager: [phone]  Patient name: Rebecca Rhodes Medical record number: [id number] Date of birth: 02/26/1981 Age: 33 y.o. Gender: female  Primary Care Provider: Tawni CarnesWight, Andrew, MD Consultants: None Code Status: Full  Pt Overview and Major Events to Date:  10/18/14: Admitted to Hospital  Assessment and Plan: Rebecca Rhodes is a 33 y.o. female presenting with DKA . PMH is significant for type 2 diabetes, hyperlipidemia, and hypertension  # DKA - At admission, gap was 36, extremely low bicarbonate at 5.6, and some somnolence on exam. Ketones in urine >80. Glucose of 569. Has history of uncontrolled type 2 diabetes with her last A1c being 17.7 in September of this year. Out of insulin for 2 weeks. - Due to medication noncompliance. DDx includes infection. Urinalysis with small leukocytes but no symptoms of UTI so this is most likely representative of asymptomatic bacteriuria - Urine Cx-   - UDS- positive for Opioids - Potassium 4.9 at admission. 3.3 this morning  - Given 40mEq of potassium  - Bicarb 21 this morning. Continue to monitor - Transitioned from insulin drip to subcutaneous insulin (Novolog 70/30 45U in am and 20U in pm)  - Increase to 50U in am and 25U in pm - BG of 200s-300s   - Consider discharge if lunch time BG is <200. If still uncontrolled, continue to monitor with increased Novolog dose - AG 13 this morning - CXR- no acute cardiopulmonary disease - Glucose stabilizer   # HTN - 145/102 today - Currently on Lisinopril 20mg .  Increase dose to 40mg   - PRN hydralazine ordered  # AKI- Baseline Cre 0.6. Elevated to 1.13 at admission. - 0.63 this monring - Fluids, monitor BMP  FEN/GI: Carb modified diet Prophylaxis: subq heparin  [facility]  Subjective:  No acute complaints overnight. Denies abdominal pain or any other complaints today. States she feels  ready to go home.  Objective: Temp:  [97.6 F (36.4 C)-98.4 F (36.9 C)] 98.4 F (36.9 C) (11/11 0329) Pulse Rate:  [74-94] 74 (11/11 0744) Resp:  [13-22] 19 (11/11 0744) BP: (137-151)/(83-106) 145/102 mmHg (11/11 0744) SpO2:  [99 %-100 %] 100 % (11/11 0744)  Physical Exam: General: 33yo female resting comfortably in no apparent distress Cardiovascular: S1 and S2 noted. No murmurs/rubs/gallops. Respiratory: Clear to auscultation bilaterally. No wheezes/rales/rhonchi. Abdomen: Bowel sounds noted. Soft and non-distended. Non-tender. Extremities: No edema. Moves spontaneously.  Laboratory:  Recent Labs Lab 10/18/14 1049 10/20/14 0553  WBC 10.2 5.6  HGB 15.0 13.9  HCT 45.6 40.8  PLT 306 250    Recent Labs Lab 10/21/14 0528 10/21/14 1843 10/22/14 0300  NA 136* 136* 138  K 3.8 3.7 3.3*  CL 104 103 104  CO2 17* 20 21  BUN 6 7 6   CREATININE 0.73 0.70 0.63  CALCIUM 8.2* 8.4 8.2*  GLUCOSE 342* 344* 227*  AG: 36>>25>>22>>17 Urine Ketones: >80 ABG    Component Value Date/Time   PHART 7.160* 10/18/2014 1244   PCO2ART 15.8* 10/18/2014 1244   PO2ART 117.0* 10/18/2014 1244   HCO3 5.6* 10/18/2014 1244   TCO2 6 10/18/2014 1244   ACIDBASEDEF 21.0* 10/18/2014 1244   O2SAT 97.0 10/18/2014 1244   Imaging/Diagnostic Tests: Dg Chest 2 View  10/18/2014   CLINICAL DATA:  Shortness of breath, hypertension diabetes. Initial encounter.  EXAM: CHEST  2 VIEW  COMPARISON:  12/23/2013; 11/04/2012  FINDINGS: Grossly unchanged cardiac silhouette and mediastinal contours. No focal airspace opacities. No pleural effusion  or pneumothorax. No evidence of edema. No acute osseus abnormalities.  IMPRESSION: No acute cardiopulmonary disease.   Electronically Signed   By: Simonne ComeJohn  Watts M.D.   On: 10/18/2014 12:28   Araceli Bouchealeigh N Rumley, DO 10/22/2014, 8:08 AM PGY-1, [place] [facility] FPTS Intern pager: [phone], text pages welcome

## 2014-10-22 NOTE — Progress Notes (Signed)
Inpatient Diabetes Program Recommendations  AACE/ADA: New Consensus Statement on Inpatient Glycemic Control (2013)  Target Ranges:  Prepandial:   less than 140 mg/dL      Peak postprandial:   less than 180 mg/dL (1-2 hours)      Critically ill patients:  140 - 180 mg/dL   Inpatient Diabetes Program Recommendations Correction (SSI): Please consider addition of sensitive tidwc correction and pososibly the HS correction scale as well.- use until the 70/30 dosage is adequate.   Thank you, Lenor CoffinAnn Mischa Brittingham, RN, CNS, Diabetes Coordinator [phone])

## 2014-10-23 LAB — BASIC METABOLIC PANEL
ANION GAP: 14 (ref 5–15)
BUN: 7 mg/dL (ref 6–23)
CALCIUM: 8.8 mg/dL (ref 8.4–10.5)
CO2: 23 mEq/L (ref 19–32)
Chloride: 101 mEq/L (ref 96–112)
Creatinine, Ser: 0.67 mg/dL (ref 0.50–1.10)
GFR calc Af Amer: >90 mL/min (ref >90)
GFR calc non Af Amer: >90 mL/min (ref >90)
Glucose, Bld: 225 mg/dL — ABNORMAL HIGH (ref 70–99)
POTASSIUM: 3.7 meq/L (ref 3.7–5.3)
Sodium: 138 mEq/L (ref 137–147)

## 2014-10-23 LAB — GLUCOSE, CAPILLARY
GLUCOSE-CAPILLARY: 245 mg/dL — AB (ref 70–99)
GLUCOSE-CAPILLARY: 325 mg/dL — AB (ref 70–99)
Glucose-Capillary: 324 mg/dL — ABNORMAL HIGH (ref 70–99)

## 2014-10-23 MED ORDER — LISINOPRIL 40 MG PO TABS: 40.0000 mg | ORAL_TABLET | Freq: Every day | ORAL | Status: DC

## 2014-10-23 MED ORDER — INSULIN NPH ISOPHANE & REGULAR (70-30) 100 UNIT/ML ~~LOC~~ SUSP: 30.0000 [IU] | Freq: Two times a day (BID) | SUBCUTANEOUS | Status: DC

## 2014-10-23 NOTE — Discharge Instructions (Signed)
You were admitted for DKA. I am stopping the Novolog and switching you to Novolin, which should be more affordable for you! You may ask the pharmacist what the cost is with and without your insurance to see which cost is cheaper...sometimes it may be more expensive when you pay with insurance! Please use 55Units in the morning and 30Units in the afternoon. Keep a close eye out for low blood sugar and let us know if you have any episodes! We are also upping your dose of Lisinopril to 40mg .  Please come to your follow up appointment so we can see how your sugars and blood pressures are doing! Please feel free to call the office or schedule a sooner appointment if you have any questions or if you experience any episodes of low blood sugars.  Diabetic Ketoacidosis Diabetic ketoacidosis (DKA) is a life-threatening complication of type 1 diabetes. It must be quickly recognized and treated. Treatment requires hospitalization. CAUSES  When there is no insulin in the body, glucose (sugar) cannot be used, and the body breaks down fat for energy. When fat breaks down, acids (ketones) build up in the blood. Very high levels of glucose and high levels of acids lead to severe loss of body fluids (dehydration) and other dangerous chemical changes. This stresses your vital organs and can cause coma or death. SIGNS AND SYMPTOMS   Tiredness (fatigue).  Weight loss.  Excessive thirst.  Ketones in your urine.  Light-headedness.  Fruity or sweet smelling breath.  Excessive urination.  Visual changes.  Confusion or irritability.  Nausea or vomiting.  Rapid breathing.  Stomachache or abdominal pain. DIAGNOSIS  Your health care provider will diagnose DKA based on your history, physical exam, and blood tests. The health care provider will check to see if you have another illness that caused you to go into DKA. Most of this will be done quickly in an emergency room. TREATMENT   Fluid replacement to correct  dehydration.  Insulin.  Correction of electrolytes, such as potassium and sodium.  Antibiotic medicines. PREVENTION  Always take your insulin. Do not skip your insulin injections.  If you are sick, treat yourself quickly. Your body often needs more insulin to fight the illness.  Check your blood glucose regularly.  Check urine ketones if your blood glucose is greater than 240 milligrams per deciliter (mg/dL).  Do not use outdated (expired) insulin.  If your blood glucose is high, drink plenty of fluids. This helps flush out ketones. HOME CARE INSTRUCTIONS   If you are sick, follow the advice of your health care provider.  To prevent dehydration, drink enough water and fluids to keep your urine clear or pale yellow.  If you cannot eat, alternate between drinking fluids with sugar (soda, juices, flavored gelatin) and salty fluids (broth, bouillon).  If you can eat, follow your usual diet and drink sugar-free liquids (water, diet drinks).  Always take your usual dose of insulin. If you cannot eat or if your glucose is getting too low, call your health care provider for further instructions.  Continue to monitor your blood or urine ketones every 3-4 hours around the clock. Set your alarm clock or have someone wake you up. If you are too sick, have someone test it for you.  Rest and avoid exercise. SEEK MEDICAL CARE IF:   You have a fever.  You have ketones in your urine, or your blood glucose is higher than a level your health care provider suggests. You may need extra insulin.  Call your health care provider if you need advice on adjusting your insulin.  You cannot drink at least a tablespoon (15 mL) of fluid every 15-20 minutes.  You have been vomiting for more than 2 hours.  You have symptoms of DKA:  Fruity smelling breath.  Breathing faster or slower.  Becoming very sleepy. SEEK IMMEDIATE MEDICAL CARE IF:   You have signs of dehydration:  Decreased  urination.  Increased thirst.  Dry skin and mouth.  Light-headedness.  Your blood glucose is very high (as advised by your health care provider) twice in a row.  You faint.  You have chest pain or trouble breathing.  You have a sudden, severe headache.  You have sudden weakness in one arm or one leg.  You have sudden trouble speaking or swallowing.  You have vomiting or diarrhea that is getting worse after 3 hours.  You have abdominal pain. MAKE SURE YOU:   Understand these instructions.  Will watch your condition.  Will get help right away if you are not doing well or get worse. Document Released: 11/25/2000 Document Revised: 12/03/2013 Document Reviewed: 06/03/2009 [facility] Patient Information 2015 [place] [place]. This information is not intended to replace advice given to you by your health care provider. Make sure you discuss any questions you have with your health care provider.

## 2014-10-23 NOTE — Progress Notes (Signed)
Family Medicine Teaching Service Daily Progress Note Intern Pager: [phone]  Patient name: Rebecca Rhodes Medical record number: [id number] Date of birth: 08/05/1981 Age: 33 y.o. Gender: female  Primary Care Provider: Tawni CarnesWight, Andrew, MD Consultants: None Code Status: Full  Pt Overview and Major Events to Date:  10/18/14: Admitted to Hospital  Assessment and Plan: Rebecca Rhodes is a 33 y.o. female presenting with DKA . PMH is significant for type 2 diabetes, hyperlipidemia, and hypertension  # DKA - At admission, gap was 36, extremely low bicarbonate at 5.6, and some somnolence on exam. Ketones in urine >80. Glucose of 569. Has history of uncontrolled type 2 diabetes with her last A1c being 17.7 in September of this year. Out of insulin for 2 weeks. - Due to medication noncompliance. DDx includes infection. Urinalysis with small leukocytes but no symptoms of UTI so this is most likely representative of asymptomatic bacteriuria - Urine Cx- multiple bacterial morphotypes present, non-predominant  - UDS- positive for Opioids - Potassium 4.9 at admission. 3.7 this morning. Will continue to monitor - Bicarb 21 this morning. Continue to monitor - Novolog 70/30 50U in am and 25U in pm (increased from home dose of 45 and 20U) - BG of 200s  - Consider discharge if lunch time BG is <200.  - AG 14 this morning - CXR- no acute cardiopulmonary disease - Glucose stabilizer   # HTN - 150/91 today - Dose of Lisinopril increased to 40mg   on 11/11  - First dose today - PRN hydralazine ordered  # AKI- Baseline Cre 0.6. Elevated to 1.13 at admission. - 0.67 this morning - Fluids, monitor BMP  FEN/GI: Carb modified diet Prophylaxis: subq heparin  [facility]: [facility] Anticipated discharge today pending lunch time CBG.  Subjective:  No acute complaints overnight. Denies abdominal pain or any other complaints today. States she feels ready to go  home.  Objective: Temp:  [97.5 F (36.4 C)-98.6 F (37 C)] 98.3 F (36.8 C) (11/12 0700) Pulse Rate:  [82-91] 83 (11/12 0330) Resp:  [14-19] 14 (11/12 0330) BP: (144-155)/(91-108) 150/91 mmHg (11/12 0330) SpO2:  [98 %-100 %] 100 % (11/12 0330)  Physical Exam: General: 33yo female resting comfortably in no apparent distress Cardiovascular: S1 and S2 noted. No murmurs/rubs/gallops. Respiratory: Clear to auscultation bilaterally. No wheezes/rales/rhonchi. Abdomen: Bowel sounds noted. Soft and non-distended. Non-tender. Extremities: No edema. Moves spontaneously.  Laboratory:  Recent Labs Lab 10/18/14 1049 10/20/14 0553  WBC 10.2 5.6  HGB 15.0 13.9  HCT 45.6 40.8  PLT 306 250    Recent Labs Lab 10/21/14 1843 10/22/14 0300 10/23/14 0250  NA 136* 138 138  K 3.7 3.3* 3.7  CL 103 104 101  CO2 20 21 23   BUN 7 6 7   CREATININE 0.70 0.63 0.67  CALCIUM 8.4 8.2* 8.8  GLUCOSE 344* 227* 225*   CBG (last 3)   Recent Labs  10/22/14 1639 10/22/14 2204 10/23/14 0730  GLUCAP 291* 289* 245*   AG: 36>>25>>22>>17>>14 Urine Ketones: >80 ABG    Component Value Date/Time   PHART 7.160* 10/18/2014 1244   PCO2ART 15.8* 10/18/2014 1244   PO2ART 117.0* 10/18/2014 1244   HCO3 5.6* 10/18/2014 1244   TCO2 6 10/18/2014 1244   ACIDBASEDEF 21.0* 10/18/2014 1244   O2SAT 97.0 10/18/2014 1244   Imaging/Diagnostic Tests: Dg Chest 2 View  10/18/2014   CLINICAL DATA:  Shortness of breath, hypertension diabetes. Initial encounter.  EXAM: CHEST  2 VIEW  COMPARISON:  12/23/2013; 11/04/2012  FINDINGS:  Grossly unchanged cardiac silhouette and mediastinal contours. No focal airspace opacities. No pleural effusion or pneumothorax. No evidence of edema. No acute osseus abnormalities.  IMPRESSION: No acute cardiopulmonary disease.   Electronically Signed   By: Simonne ComeJohn  Watts M.D.   On: 10/18/2014 12:28   Araceli BoucheRaleigh N Rumley, DO 10/23/2014, 9:45 AM PGY-1, [facility] FPTS Intern pager:  [phone], text pages welcome

## 2014-10-23 NOTE — Progress Notes (Signed)
Family Medicine PCP social note  I stopped by and saw my patient this morning, she appears to be doing very well and is in good spirits with family at bedside. She is happy that she might be able to go home today. I appreciate the excellent care provided by the FMTS.  Tawni CarnesAndrew Tavien Chestnut, MD 10/23/2014, 10:47 AM PGY-2, [facility]

## 2014-10-25 NOTE — Discharge Summary (Signed)
[facility] Summary  Patient name: Rebecca Rhodes Medical record number: [id number] Date of birth: 11/22/1981 Age: 33 y.o. Gender: female Date of Admission: 10/18/2014  Date of Discharge: 10/23/14 Admitting Physician: Nestor RampSara L Neal, MD  Primary Care Provider: Tawni CarnesWight, Andrew, MD Consultants: None  Indication for Hospitalization: DKA  Discharge Diagnoses/Problem List:  DKA HTN AKI  Disposition: Discharge Home  Discharge Condition: Stable  Brief Hospital Course:  [name] presented to the Emergency Department on 10/18/14 with nausea, vomiting, and dyspnea when she was unable to afford her medications for two weeks. She was noted to have a large anion gap of 36, low bicarbonate of 5.6, pH of 7.16, potassium of 4.9. BMPs were followed closely. 2L of NS bolus were given in ED and she was continued ton NS @150 . Glucose stabilizer was initiated in ED. She was admitted to the family medicine teaching service. She was transitioned from NS @150  to D5 half NS @125 . She was transitioned to subcutaneous insulin when her gap was closed x2. Subcutaneous insulin and insulin drip were running simultaneously for 1-2 hours before insulin drip was discontinued on 11/9. Potassium was noted to be low on 11/8 (3.2) and 11/10 (3.3), so potassium chloride was given. Fluids were discontinued.  Home insulin dose was titrated up while monitoring for episodes of hypoglycemia.  BP was noted to be elevated at admission, however Lisinopril was held since creatinine was noted to be elevated to 1.13. Creatinine trended down to 0.61 and lisinopril was re-intiated on 10/19/14. BP continued to remain high, so dose of Lisinopril was increased to 40mg  prior to discharge.  Issues for Follow Up:  - Discharged on Novolin 55U in am and 30U in pm. Had trouble affording medications. Novolin should be cheaper than Novolog when insurance is not used to pay.  - Discharged on Lisinopril 40mg .  - BMP to  follow up potassium level. Potassium was 3.7 on day of discharge.  Significant Procedures: None  Significant Labs and Imaging:   Recent Labs Lab 10/20/14 0553  WBC 5.6  HGB 13.9  HCT 40.8  PLT 250    Recent Labs Lab 10/20/14 2317 10/21/14 0528 10/21/14 1843 10/22/14 0300 10/23/14 0250  NA 137 136* 136* 138 138  K 3.4* 3.8 3.7 3.3* 3.7  CL 105 104 103 104 101  CO2 18* 17* 20 21 23   GLUCOSE 235* 342* 344* 227* 225*  BUN 9 6 7 6 7   CREATININE 0.80 0.73 0.70 0.63 0.67  CALCIUM 8.3* 8.2* 8.4 8.2* 8.8   ABG    Component Value Date/Time   PHART 7.160* 10/18/2014 1244   PCO2ART 15.8* 10/18/2014 1244   PO2ART 117.0* 10/18/2014 1244   HCO3 5.6* 10/18/2014 1244   TCO2 6 10/18/2014 1244   ACIDBASEDEF 21.0* 10/18/2014 1244   O2SAT 97.0 10/18/2014 1244   Results/Tests Pending at Time of Discharge: None  Discharge Medications:    Medication List    STOP taking these medications        insulin aspart protamine- aspart (70-30) 100 UNIT/ML injection  Commonly known as:  NOVOLOG MIX 70/30      TAKE these medications        atorvastatin 40 MG tablet  Commonly known as:  LIPITOR  Take 1 tablet (40 mg total) by mouth daily at 6 PM.     insulin NPH-regular Human (70-30) 100 UNIT/ML injection  Commonly known as:  NOVOLIN 70/30  Inject 30-55 Units into the skin 2 (two) times daily with  a meal. Inject 55 Units in morning and 30 units in evening.     lisinopril 40 MG tablet  Commonly known as:  PRINIVIL,ZESTRIL  Take 1 tablet (40 mg total) by mouth daily.        Discharge Instructions: Please refer to Patient Instructions section of EMR for full details.  Patient was counseled important signs and symptoms that should prompt return to medical care, changes in medications, dietary instructions, activity restrictions, and follow up appointments.   Follow-Up Appointments:     Follow-up Information    Follow up with Wenda LowJoyner, James, MD On 11/04/2014.   Specialty:  Family  Medicine   Why:  @3 :15pm at hospital f/u   Contact information:   [address] [place] [address] [phone]      Araceli BoucheRaleigh N Hema Lanza, DO 10/25/2014, 4:50 PM PGY-1, [facility]

## 2014-11-04 ENCOUNTER — Ambulatory Visit: Payer: Medicaid Other | Admitting: Family Medicine

## 2014-11-19 ENCOUNTER — Inpatient Hospital Stay: Payer: Medicaid Other | Admitting: Family Medicine

## 2014-12-16 ENCOUNTER — Encounter: Payer: Self-pay | Admitting: Family Medicine

## 2014-12-16 ENCOUNTER — Ambulatory Visit (INDEPENDENT_AMBULATORY_CARE_PROVIDER_SITE_OTHER): Payer: Medicaid Other | Admitting: Family Medicine

## 2014-12-16 VITALS — BP 164/124 | HR 83 | Temp 98.3°F | Ht 64.0 in | Wt 221.0 lb

## 2014-12-16 DIAGNOSIS — E1110 Type 2 diabetes mellitus with ketoacidosis without coma: Secondary | ICD-10-CM

## 2014-12-16 DIAGNOSIS — E1310 Other specified diabetes mellitus with ketoacidosis without coma: Secondary | ICD-10-CM

## 2014-12-16 LAB — POCT GLYCOSYLATED HEMOGLOBIN (HGB A1C): Hemoglobin A1C: 14.9

## 2014-12-16 MED ORDER — GLUCOSE BLOOD VI STRP: ORAL_STRIP | Status: DC

## 2014-12-16 NOTE — Progress Notes (Signed)
   Subjective:    Patient ID: Rebecca Rhodes, female    DOB: 10/11/1981, 34 y.o.   MRN: [id number]  HPI  Hospital follow up for DKA in November  CC: HFU  # DM:  CBG monitoring: says she hasn't checked since leaving hospital  Medication compliance: says took insulin shot every morning past week, missed about 2 afternoon doses  Out of test strips (says use OneTouch Nano, showed picture on computer and she confirmed)  Diet: has not been eating well; says a lot of fast food/mcdonalds, not a lot of vegetables  Exercise: none ROS: no polyuria, no dysuria  Review of Systems   See HPI for ROS. All other systems reviewed and are negative.  Past medical history, surgical, family, and social history reviewed and updated in the EMR as appropriate. Objective:  BP 164/124 mmHg  Pulse 83  Temp(Src) 98.3 F (36.8 C) (Oral)  Ht 5\' 4"  (1.626 m)  Wt 221 lb (100.245 kg)  BMI 37.92 kg/m2 Vitals reviewed  General: NAD CV: RRR, normal s1 and s2, no mrg Resp: clear bilaterally Abdomen: soft, obese, nontender, nondistended, normal bowel sounds Ext: no edema or cyanosis, WWP.  Assessment & Plan:  See Problem List Documentation

## 2014-12-16 NOTE — Patient Instructions (Signed)
Check your sugars 3 times a day Call if you are consistently less than 110 in the morning Call if your sugars are >500 and does not come down with your next insulin   Diet Recommendations for Diabetes   Starchy (carb) foods include: Bread, rice, pasta, potatoes, corn, crackers, bagels, muffins, all baked goods.  (Fruits, milk, and yogurt also have carbohydrate, but most of these foods will not spike your blood sugar as the starchy foods will.)  A few fruits do cause high blood sugars; use small portions of bananas (limit to 1/2 at a time), grapes, and tropical fruits.    Protein foods include: Meat, fish, poultry, eggs, dairy foods, and beans such as pinto and kidney beans (beans also provide carbohydrate).   1. Eat at least 3 meals and 1-2 snacks per day. Never go more than 4-5 hours while awake without eating.  2. Limit starchy foods to TWO per meal and ONE per snack. ONE portion of a starchy  food is equal to the following:   - ONE slice of bread (or its equivalent, such as half of a hamburger bun).   - 1/2 cup of a "scoopable" starchy food such as potatoes or rice.   - 15 grams of carbohydrate as shown on food label.  3. Both lunch and dinner should include a protein food, a carb food, and vegetables.   - Obtain twice as many veg's as protein or carbohydrate foods for both lunch and dinner.   - Fresh or frozen veg's are best.   - Try to keep frozen veg's on hand for a quick vegetable serving.    4. Breakfast should always include protein.

## 2014-12-22 NOTE — Assessment & Plan Note (Addendum)
Pt reporting non-compliance with CBG monitoring, does take her insulin with some missed doses (including this morning). She is at high risk for re-hospitalization for poorly controlled DM (has already demonstrated multiple DKA episodes). Counseled about the importance of checking her CBG regularly, and until next visit at least 4 times a day. Given printed CBG log to fill out and bring to next visit. No changes in current insulin regimen as we have no baseline for how well it is being controlled, asked her to RTC within 1 month.

## 2015-01-17 ENCOUNTER — Emergency Department (HOSPITAL_COMMUNITY): Payer: Medicaid Other

## 2015-01-17 ENCOUNTER — Encounter (HOSPITAL_COMMUNITY): Payer: Self-pay | Admitting: Emergency Medicine

## 2015-01-17 ENCOUNTER — Inpatient Hospital Stay (HOSPITAL_COMMUNITY)
Admission: EM | Admit: 2015-01-17 | Discharge: 2015-01-19 | DRG: 637 | Disposition: A | Discharge status: Home / Self Care | Payer: Medicaid Other | Attending: Family Medicine | Admitting: Family Medicine

## 2015-01-17 DIAGNOSIS — R0602 Shortness of breath: Secondary | ICD-10-CM

## 2015-01-17 DIAGNOSIS — Z794 Long term (current) use of insulin: Secondary | ICD-10-CM | POA: Diagnosis not present

## 2015-01-17 DIAGNOSIS — J80 Acute respiratory distress syndrome: Secondary | ICD-10-CM | POA: Diagnosis present

## 2015-01-17 DIAGNOSIS — N170 Acute kidney failure with tubular necrosis: Secondary | ICD-10-CM | POA: Diagnosis present

## 2015-01-17 DIAGNOSIS — E785 Hyperlipidemia, unspecified: Secondary | ICD-10-CM | POA: Diagnosis present

## 2015-01-17 DIAGNOSIS — Z9114 Patient's other noncompliance with medication regimen: Secondary | ICD-10-CM | POA: Diagnosis present

## 2015-01-17 DIAGNOSIS — E1310 Other specified diabetes mellitus with ketoacidosis without coma: Secondary | ICD-10-CM | POA: Diagnosis present

## 2015-01-17 DIAGNOSIS — E1165 Type 2 diabetes mellitus with hyperglycemia: Secondary | ICD-10-CM | POA: Diagnosis present

## 2015-01-17 DIAGNOSIS — I10 Essential (primary) hypertension: Secondary | ICD-10-CM | POA: Diagnosis present

## 2015-01-17 DIAGNOSIS — N179 Acute kidney failure, unspecified: Secondary | ICD-10-CM | POA: Diagnosis present

## 2015-01-17 DIAGNOSIS — T464X5A Adverse effect of angiotensin-converting-enzyme inhibitors, initial encounter: Secondary | ICD-10-CM | POA: Diagnosis present

## 2015-01-17 DIAGNOSIS — Z880 Allergy status to penicillin: Secondary | ICD-10-CM

## 2015-01-17 DIAGNOSIS — E860 Dehydration: Secondary | ICD-10-CM | POA: Diagnosis present

## 2015-01-17 DIAGNOSIS — E1110 Type 2 diabetes mellitus with ketoacidosis without coma: Secondary | ICD-10-CM | POA: Diagnosis present

## 2015-01-17 LAB — GLUCOSE, CAPILLARY
GLUCOSE-CAPILLARY: 212 mg/dL — AB (ref 70–99)
Glucose-Capillary: 195 mg/dL — ABNORMAL HIGH (ref 70–99)
Glucose-Capillary: 183 mg/dL — ABNORMAL HIGH (ref 70–99)
Glucose-Capillary: 177 mg/dL — ABNORMAL HIGH (ref 70–99)
Glucose-Capillary: 165 mg/dL — ABNORMAL HIGH (ref 70–99)

## 2015-01-17 LAB — BASIC METABOLIC PANEL
ANION GAP: 18 — AB (ref 5–15)
Anion gap: 25 — ABNORMAL HIGH (ref 5–15)
Anion gap: 15 (ref 5–15)
BUN: 20 mg/dL (ref 6–23)
BUN: 20 mg/dL (ref 6–23)
BUN: 15 mg/dL (ref 6–23)
CO2: 10 mmol/L — CL (ref 19–32)
CO2: 9 mmol/L — CL (ref 19–32)
CO2: 6 mmol/L — CL (ref 19–32)
CREATININE: 2.07 mg/dL — AB (ref 0.50–1.10)
CREATININE: 1.71 mg/dL — AB (ref 0.50–1.10)
Calcium: 9 mg/dL (ref 8.4–10.5)
Calcium: 7.7 mg/dL — ABNORMAL LOW (ref 8.4–10.5)
Calcium: 7.8 mg/dL — ABNORMAL LOW (ref 8.4–10.5)
Chloride: 97 mmol/L (ref 96–112)
Chloride: 109 mmol/L (ref 96–112)
Chloride: 116 mmol/L — ABNORMAL HIGH (ref 96–112)
Creatinine, Ser: 1.37 mg/dL — ABNORMAL HIGH (ref 0.50–1.10)
GFR calc Af Amer: 57 mL/min — ABNORMAL LOW (ref >90)
GFR calc non Af Amer: 30 mL/min — ABNORMAL LOW (ref >90)
GFR calc non Af Amer: 38 mL/min — ABNORMAL LOW (ref >90)
GFR calc non Af Amer: 49 mL/min — ABNORMAL LOW (ref >90)
GFR, EST AFRICAN AMERICAN: 35 mL/min — AB (ref >90)
GFR, EST AFRICAN AMERICAN: 44 mL/min — AB (ref >90)
GLUCOSE: 554 mg/dL — AB (ref 70–99)
GLUCOSE: 405 mg/dL — AB (ref 70–99)
GLUCOSE: 221 mg/dL — AB (ref 70–99)
POTASSIUM: 4.5 mmol/L (ref 3.5–5.1)
Potassium: 4.7 mmol/L (ref 3.5–5.1)
Potassium: 4 mmol/L (ref 3.5–5.1)
SODIUM: 136 mmol/L (ref 135–145)
Sodium: 132 mmol/L — ABNORMAL LOW (ref 135–145)
Sodium: 137 mmol/L (ref 135–145)

## 2015-01-17 LAB — URINE MICROSCOPIC-ADD ON

## 2015-01-17 LAB — I-STAT TROPONIN, ED: TROPONIN I, POC: 0 ng/mL (ref 0.00–0.08)

## 2015-01-17 LAB — I-STAT VENOUS BLOOD GAS, ED
Acid-base deficit: 25 mmol/L — ABNORMAL HIGH (ref 0.0–2.0)
BICARBONATE: 7.6 meq/L — AB (ref 20.0–24.0)
O2 SAT: 18 %
TCO2: 9 mmol/L (ref 0–100)
pCO2, Ven: 37.4 mmHg — ABNORMAL LOW (ref 45.0–50.0)
pH, Ven: 6.917 — CL (ref 7.250–7.300)
pO2, Ven: 23 mmHg — CL (ref 30.0–45.0)

## 2015-01-17 LAB — URINALYSIS, ROUTINE W REFLEX MICROSCOPIC
Bilirubin Urine: NEGATIVE
LEUKOCYTES UA: NEGATIVE
Leukocytes, UA: NEGATIVE
Nitrite: NEGATIVE
Nitrite: NEGATIVE
PH: 5 (ref 5.0–8.0)
PROTEIN: 30 mg/dL — AB
Protein, ur: 100 mg/dL — AB
Specific Gravity, Urine: 1.026 (ref 1.005–1.030)
Specific Gravity, Urine: 1.023 (ref 1.005–1.030)
UROBILINOGEN UA: 0.2 mg/dL (ref 0.0–1.0)
Urobilinogen, UA: 0.2 mg/dL (ref 0.0–1.0)
pH: 5 (ref 5.0–8.0)

## 2015-01-17 LAB — CBG MONITORING, ED
GLUCOSE-CAPILLARY: 502 mg/dL — AB (ref 70–99)
Glucose-Capillary: 450 mg/dL — ABNORMAL HIGH (ref 70–99)
Glucose-Capillary: 381 mg/dL — ABNORMAL HIGH (ref 70–99)
Glucose-Capillary: 347 mg/dL — ABNORMAL HIGH (ref 70–99)
Glucose-Capillary: 258 mg/dL — ABNORMAL HIGH (ref 70–99)
Glucose-Capillary: 260 mg/dL — ABNORMAL HIGH (ref 70–99)

## 2015-01-17 LAB — CBC WITH DIFFERENTIAL/PLATELET
BASOS PCT: 0 % (ref 0–1)
Basophils Absolute: 0 10*3/uL (ref 0.0–0.1)
EOS PCT: 0 % (ref 0–5)
Eosinophils Absolute: 0 10*3/uL (ref 0.0–0.7)
HEMATOCRIT: 48.8 % — AB (ref 36.0–46.0)
HEMOGLOBIN: 15.8 g/dL — AB (ref 12.0–15.0)
LYMPHS ABS: 2 10*3/uL (ref 0.7–4.0)
Lymphocytes Relative: 12 % (ref 12–46)
MCH: 29.2 pg (ref 26.0–34.0)
MCHC: 32.4 g/dL (ref 30.0–36.0)
MCV: 90 fL (ref 78.0–100.0)
Monocytes Absolute: 0.5 10*3/uL (ref 0.1–1.0)
Monocytes Relative: 3 % (ref 3–12)
Neutro Abs: 14.5 10*3/uL — ABNORMAL HIGH (ref 1.7–7.7)
Neutrophils Relative %: 85 % — ABNORMAL HIGH (ref 43–77)
Platelets: 292 10*3/uL (ref 150–400)
RBC: 5.42 MIL/uL — ABNORMAL HIGH (ref 3.87–5.11)
RDW: 14 % (ref 11.5–15.5)
WBC: 17 10*3/uL — ABNORMAL HIGH (ref 4.0–10.5)

## 2015-01-17 LAB — POC URINE PREG, ED: Preg Test, Ur: NEGATIVE

## 2015-01-17 LAB — MRSA PCR SCREENING: MRSA by PCR: POSITIVE — AB

## 2015-01-17 LAB — TROPONIN I
Troponin I: <0.03 ng/mL (ref <0.031)
Troponin I: <0.03 ng/mL (ref <0.031)

## 2015-01-17 LAB — KETONES, QUALITATIVE

## 2015-01-17 LAB — PHOSPHORUS: Phosphorus: 4.7 mg/dL — ABNORMAL HIGH (ref 2.3–4.6)

## 2015-01-17 LAB — MAGNESIUM: MAGNESIUM: 2.1 mg/dL (ref 1.5–2.5)

## 2015-01-17 MED ORDER — SODIUM CHLORIDE 0.9 % IV SOLN
1000.0000 mL | INTRAVENOUS | Status: DC
  Administered 2015-01-17: 1000 mL via INTRAVENOUS

## 2015-01-17 MED ORDER — SODIUM CHLORIDE 0.9 % IV SOLN
INTRAVENOUS | Status: AC
  Administered 2015-01-17: 14:00:00 via INTRAVENOUS

## 2015-01-17 MED ORDER — HEPARIN SODIUM (PORCINE) 5000 UNIT/ML IJ SOLN
5000.0000 [IU] | Freq: Three times a day (TID) | INTRAMUSCULAR | Status: DC
  Administered 2015-01-17 – 2015-01-19 (×5): 5000 [IU] via SUBCUTANEOUS
  Filled 2015-01-17 (×9): qty 1

## 2015-01-17 MED ORDER — ONDANSETRON HCL 4 MG/2ML IJ SOLN
4.0000 mg | Freq: Once | INTRAMUSCULAR | Status: AC
  Administered 2015-01-17: 4 mg via INTRAVENOUS
  Filled 2015-01-17: qty 2

## 2015-01-17 MED ORDER — SODIUM CHLORIDE 0.9 % IV SOLN
INTRAVENOUS | Status: DC
  Administered 2015-01-17: 4.9 [IU]/h via INTRAVENOUS
  Filled 2015-01-17: qty 2.5

## 2015-01-17 MED ORDER — INSULIN REGULAR HUMAN 100 UNIT/ML IJ SOLN
INTRAMUSCULAR | Status: DC
  Administered 2015-01-17: 6.4 [IU]/h via INTRAVENOUS
  Administered 2015-01-17: 9.1 [IU]/h via INTRAVENOUS
  Filled 2015-01-17: qty 2.5

## 2015-01-17 MED ORDER — SODIUM CHLORIDE 0.9 % IV SOLN
1000.0000 mL | Freq: Once | INTRAVENOUS | Status: AC
  Administered 2015-01-17: 1000 mL via INTRAVENOUS

## 2015-01-17 MED ORDER — LISINOPRIL 20 MG PO TABS
40.0000 mg | ORAL_TABLET | Freq: Once | ORAL | Status: AC
  Administered 2015-01-17: 40 mg via ORAL
  Filled 2015-01-17: qty 2

## 2015-01-17 MED ORDER — HYDRALAZINE HCL 20 MG/ML IJ SOLN: 5.0000 mg | Freq: Three times a day (TID) | INTRAMUSCULAR | Status: DC | PRN

## 2015-01-17 MED ORDER — ONDANSETRON HCL 4 MG/2ML IJ SOLN: 4.0000 mg | Freq: Three times a day (TID) | INTRAMUSCULAR | Status: DC | PRN

## 2015-01-17 MED ORDER — DEXTROSE-NACL 5-0.45 % IV SOLN: INTRAVENOUS | Status: DC

## 2015-01-17 MED ORDER — LISINOPRIL 40 MG PO TABS: 40.0000 mg | ORAL_TABLET | Freq: Every day | ORAL | Status: DC

## 2015-01-17 MED ORDER — DEXTROSE-NACL 5-0.45 % IV SOLN
INTRAVENOUS | Status: DC
  Administered 2015-01-17 – 2015-01-18 (×2): via INTRAVENOUS

## 2015-01-17 MED ORDER — SODIUM CHLORIDE 0.9 % IV BOLUS (SEPSIS)
1000.0000 mL | Freq: Once | INTRAVENOUS | Status: AC
  Administered 2015-01-17: 1000 mL via INTRAVENOUS

## 2015-01-17 MED ORDER — ATORVASTATIN CALCIUM 40 MG PO TABS
40.0000 mg | ORAL_TABLET | Freq: Every day | ORAL | Status: DC
  Administered 2015-01-18: 40 mg via ORAL
  Filled 2015-01-17 (×2): qty 1

## 2015-01-17 MED ORDER — FAMOTIDINE IN NACL 20-0.9 MG/50ML-% IV SOLN
20.0000 mg | Freq: Two times a day (BID) | INTRAVENOUS | Status: AC
  Administered 2015-01-17 – 2015-01-18 (×2): 20 mg via INTRAVENOUS
  Filled 2015-01-17 (×2): qty 50

## 2015-01-17 MED ORDER — POTASSIUM CHLORIDE 10 MEQ/100ML IV SOLN
10.0000 meq | INTRAVENOUS | Status: AC
  Administered 2015-01-17 (×2): 10 meq via INTRAVENOUS
  Filled 2015-01-17 (×2): qty 100

## 2015-01-17 MED ORDER — SODIUM CHLORIDE 0.9 % IV SOLN
INTRAVENOUS | Status: DC
  Administered 2015-01-17: 14:00:00 via INTRAVENOUS

## 2015-01-17 NOTE — ED Notes (Signed)
Pt having increased lethargy. Pt still oriented, but alert to voice prompting. Pt does not open eyes when she is talking to you.

## 2015-01-17 NOTE — H&P (Signed)
Family Medicine Teaching [facility] Admission History and Physical Service Pager: [phone]  Patient name: Rebecca Rhodes Medical record number: [id number] Date of birth: 04/23/1981 Age: 34 y.o. Gender: female  Primary Care Provider: Tawni CarnesWight, Andrew, MD Consultants: none Code Status: Full  Chief Complaint: dyspnea, nausea, weakness  Assessment and Plan: Rebecca Rhodes is a 34 y.o. female presenting with respiratory distress found to be in DKA, likely due to out of home insulin x 2 weeks. PMH is significant for T2DM (poorly controlled with h/o non-adherence and previous hospitalizations with DKA, last 10/2014), HTN, HLD  # DKA, in uncontrolled T2DM, likely secondary to non-adherence to insulin - On admission, acute resp distress worsening x 2 days now with somnolence and clinical mod dehydration on exam, previously well without history concerning for infection. Confirmed DKA in ED with glucose >500, acidotic on VBG, BMET with AG 25 and bicarb 10. Suspected due to uncontrolled T2DM, last A1c 14.6 (Jan 2015), on 70/30 insulin at home (55u AM / 30u PM) ran out x 2 weeks. Afebrile, rather Hypothermic in ED, work-up negative for infection UA, CXR, no focal findings of infxn. - Admit to SDU, FPTS, attending Dr. Jennette KettleNeal - Insulin gtt (Glucose Stabilizer) protocol for DKA - s/p 2 L IVF bolus in ED - IVF protocol with NS @ 200cc/hr >> switch to D5 1/2NS @ 200cc/hr once CBG < 250s - Potassium IV per protocol - 2x 10meq runs for K between 3.5 to 5 - BMET q 2 hr - monitor AG / acidosis - Transition to SQ insulin basal + SSI once AG closed x 2 with stabilized CBGs - Troponin cycle x 3 - r/o MI - Monitor closely and if significant worsening acute resp decline low threshold to call CCM for airway management  # HTN - Hypertensive in ED 198/97, out of home Lisinopril - Hold home Lisinopril due to AKI. She was given home Lisinopril 40mg  x 1 by ED with gradual response - Hydralazine IV 5mg  q 8 hr PRN ordered  (SBP >190, DBP >130)  # AKI, suspected due to dehydration with DKA vs ATN if hypoperfusion - Baseline Cr 0.6 to 0.7 - On admission elevated Cr to 2.00 >> improved to 1.71 - Trend Cr on BMET - IVF rehydration - Hold nephrotoxic meds (Lisinopril)  FEN/GI: - IVF per DKA protocol (NS @ 200cc/hr >> D5 1/2NS when CBG < 250) -  NPO until out of the acute phase then carb modified diet - Pepcid IV BID for GI prophylaxis in DKA Prophylaxis: SQ Heparin  Disposition: SDU for DKA, insulin stabilizer protocol, regular lab monitoring, transition to SQ insulin when stabilized and tolerating PO likely tomorrow and transfer to floor  History of Present Illness: Rebecca Rhodes is a 34 y.o. female presenting with nausea and dyspnea worsening for 2 days with concerns for repeat episode of DKA. Patient able to provide majority of history, additionally husband at bedside contributed. She stated that she had run out of her home insulin (Novolin 70/30) about 2 weeks ago, she tried to pick it up at the pharmacy and was told that she has to wait 30 days, however she did not contact our [facility] regarding this issue. Additionally out of her Lisinopril. Otherwise she felt fine recently without illness and no symptoms until about 2 days ago. Started with shortness of breath, gradually worsened with some nausea, occasional abdominal and chest pain with headaches, overall felt "sick like last time in the hospital".  Admits to occasional non-productive cough.  Denies any fevers, sweating, chills, dysuria, flank pain, hematuria, vomiting, diarrhea. No SOB or DOE at baseline.  Lives at home with husband his mother, and their daughter.  Review Of Systems: Per HPI, Otherwise 12 point review of systems was performed and was unremarkable.  Patient Active Problem List   Diagnosis Date Noted  . DKA (diabetic ketoacidoses) 01/17/2015  . 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 Boil, leg 06/24/2013  . 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 Benign essential hypertension antepartum 02/18/2013  . Acute respiratory failure 10/24/2012  . HTN (hypertension) 06/04/2012  . Hyperlipidemia 06/04/2012  . Diabetes mellitus 06/04/2012   Past Medical History: Past Medical History  Diagnosis Date  . HTN (hypertension) 06/04/2012  . Hyperlipidemia 06/04/2012  . Diabetes mellitus 06/04/2012  . HSV-2 infection   . DKA (diabetic ketoacidoses)   . Acute respiratory failure    Past Surgical History: Past Surgical History  Procedure Laterality Date  . Irrigation and debridement abscess  10/31/2012    Procedure: IRRIGATION AND DEBRIDEMENT ABSCESS;  Surgeon: Cherylynn Ridges, MD;  Location: [facility] OR;  Service: General;  Laterality: Bilateral;  . Cesarean section N/A 08/08/2013    Procedure: CESAREAN SECTION;  Surgeon: Tereso Newcomer, MD;  Location: [facility]S;  Service: Obstetrics;  Laterality: N/A;   Social History: History  Substance Use Topics  . Smoking status: Never Smoker   . Smokeless tobacco: Never Used  . Alcohol Use: No   Additional social history: Please also refer to relevant sections of EMR  Family History: Family History  Problem Relation Age of Onset  . Hypertension Mother   . Stroke Mother   . Heart disease Father   . Diabetes Father   . Cancer Maternal Grandfather   . Hypertension Brother   . Diabetes Paternal Aunt   . Diabetes Paternal Uncle   . Diabetes Paternal Grandmother   . Diabetes Paternal Grandfather    Allergies and Medications: Allergies  Allergen Reactions  . Penicillins Rash   No current facility-administered medications on file prior to encounter.   Current Outpatient Prescriptions on File Prior to Encounter  Medication Sig Dispense Refill  . atorvastatin (LIPITOR) 40 MG tablet Take 1 tablet (40 mg total) by mouth daily at 6 PM. 30 tablet 11  . glucose blood (ACCU-CHEK SMARTVIEW) test strip Check sugar 3 times a day 100 each 12  . insulin NPH-regular Human  (NOVOLIN 70/30) (70-30) 100 UNIT/ML injection Inject 30-55 Units into the skin 2 (two) times daily with a meal. Inject 55 Units in morning and 30 units in evening. 30 mL 11  . lisinopril (PRINIVIL,ZESTRIL) 40 MG tablet Take 1 tablet (40 mg total) by mouth daily. 30 tablet 0    Objective: BP 179/81 mmHg  Pulse 97  Temp(Src) 96.8 F (36 C) (Rectal)  Resp 32  Ht  (1.626 m)  Wt 236 lb (107.049 kg)  BMI 40.49 kg/m2  SpO2 100%  LMP 01/10/2015 Exam: Gen: laying in bed with moderate respiratory distress, cooperative, able to follow commands and respond appropriately to questions.  HEENT: NCAT, EOMI, PERRL, mild b/l conjunctival injection with anicteric sclera, oropharynx clear with significantly dry MM and tongue CV: Tachycardic, regular rhythm, S1/S2, no murmurs heard. Delayed cap refill @ 3-4 sec Resp: CTAB with shallow breathing, no focal crackles, rhonchi or wheezes. Labored breathing with tachypnea 30s. Abd: soft, NTND, no guarding or organomegaly, +active BS Ext: non-tender, no edema, WWP Neuro: somnolent, awake, oriented (name, [facility], Feb 2016), grossly non-focal, moves all ext  equally  Labs and Imaging: CBC BMET   Recent Labs Lab 01/17/15 0833  WBC 17.0*  HGB 15.8*  HCT 48.8*  PLT 292    Recent Labs Lab 01/17/15 0833  NA 132*  K 4.7  CL 97  CO2 10*  BUN 20  CREATININE 2.07*  GLUCOSE 554*  CALCIUM 9.0      Recent Labs Lab 01/17/15 0858  HCO3 7.6*  TCO2 9  O2SAT 18.0   Urinalysis    Component Value Date/Time   COLORURINE YELLOW 01/17/2015 1342   APPEARANCEUR CLEAR 01/17/2015 1342   LABSPEC 1.026 01/17/2015 1342   PHURINE 5.0 01/17/2015 1342   GLUCOSEU >1000* 01/17/2015 1342   HGBUR SMALL* 01/17/2015 1342   BILIRUBINUR NEGATIVE 01/17/2015 1342   KETONESUR >80* 01/17/2015 1342   PROTEINUR 100* 01/17/2015 1342   UROBILINOGEN 0.2 01/17/2015 1342   NITRITE NEGATIVE 01/17/2015 1342   LEUKOCYTESUR NEGATIVE 01/17/2015 1342   Urine pregnancy  (negative)  Troponin - negative x 2  Blood acetone- small+  2/6 Blood Culture - pending  2/6 VBG pH 6.917 / CO2 37.4 (low), O2 23 (low), bicarb 7.6 (inc)  2/6 EKG Sinus, 96, LVH, no acute ST-T wave changes, borderline prolong QT 500  2/6 CXR Portable 1v IMPRESSION: No active disease.  Saralyn Pilar, DO 01/17/2015, 11:37 AM PGY-2, [facility] FPTS Intern pager: [phone], text pages welcome

## 2015-01-17 NOTE — Progress Notes (Signed)
Family Practice Teaching Service Interval Progress Note  S: Checked on patient @ approx 2150. She was resting and appeared to be comfortable. Woke up easily and briefly responded appropriately stating her breathing was better, denies any pain, was "just tired" but no new concerns. Updates from RN as well.  O: BP 115/75 mmHg  Pulse 115  Temp(Src) 98.8 F (37.1 C) (Oral)  Resp 27  Ht 5\' 4"  (1.626 m)  Wt 236 lb (107.049 kg)  BMI 40.49 kg/m2  SpO2 100%  LMP 01/10/2015  Gen: resting, tired, with significantly improved breathing, no resp distress and NAD CV: Tachycardic, regular rhythm Resp: CTAB improved depth of breaths, no focal findings. Improved stable tachypnea, only mild inc WOB.  A&P: Briefly, Sampson SiKeisha Eakes is a 34 y.o. Female (PMH uncontrolled T2DM) admitted with acute DKA and resp distress. # DKA - Improving - Vitals improved, resolved hypothermia, BP improved, persistent tachycardia likely d/t dehydration - Continue insulin drip per DKA protocol - D5 1/2NS @ 200cc/hr, since CBGs < 250 - BMET q 2 hr - Closing AG trend: 25 >> 21 >> 15 - Bicarb still dropping 10 >> 9 >> 6 - Plan continue insulin drip until AG closed < 12 for 2 results, if persistently mildly elevated and bicarb not resolving > 15 would consider repeat VBG to check pH for improvement (goal >7.30) - When indicated transition to SQ insulin with home 70/30 mix 30u BID, initial 30u dose to be given, followed by continued insulin drip for 2 hours until stopped  Saralyn PilarAlexander Artina Minella, DO Family Medicine PGY-2 Service Pager [phone]

## 2015-01-17 NOTE — ED Notes (Signed)
Abnormal labs given to PA Ross StoresSanders

## 2015-01-17 NOTE — ED Notes (Signed)
CBG 502

## 2015-01-17 NOTE — ED Provider Notes (Signed)
CSN: [id number]     Arrival date & time 01/17/15  0805 History   First MD Initiated Contact with Patient 01/17/15 [phone]     Chief Complaint  Patient presents with  . Shortness of Breath  . Hyperglycemia     (Consider location/radiation/quality/duration/timing/severity/associated sxs/prior Treatment) The history is provided by the patient and medical records.    This is a 34 y.o. F with PMH significant for HTN, HLP, DM, presenting to the ED for shortness of breath and hyperglycemia.  Patient states she attempted to pick up her diabetes medications 2 weeks ago, but was told she had to wait an additional 30 days before her prescription was valid. She has had no insulin since this time and she has not been checking her blood sugar regularly. She endorses polydipsia and polyuria. She denies any abdominal pain, nausea, vomiting, or diarrhea.  She does note some chest tightness and shortness of breath. She states this is typical when she goes into DKA. She denies any cough, upper respiratory symptoms, fever, or chills. No known sick contacts. Patient has no prior cardiac history.  Patient is not a smoker.  Patient has had prior admissions for DKA and subsequent respiratory failure.  Past Medical History  Diagnosis Date  . HTN (hypertension) 06/04/2012  . Hyperlipidemia 06/04/2012  . Diabetes mellitus 06/04/2012  . HSV-2 infection   . DKA (diabetic ketoacidoses)   . Acute respiratory failure    Past Surgical History  Procedure Laterality Date  . Irrigation and debridement abscess  10/31/2012    Procedure: IRRIGATION AND DEBRIDEMENT ABSCESS;  Surgeon: Cherylynn Ridges, MD;  Location: [facility] OR;  Service: General;  Laterality: Bilateral;  . Cesarean section N/A 08/08/2013    Procedure: CESAREAN SECTION;  Surgeon: Tereso Newcomer, MD;  Location: [facility]S;  Service: Obstetrics;  Laterality: N/A;   Family History  Problem Relation Age of Onset  . Hypertension Mother   . Stroke Mother   . Heart disease Father    . Diabetes Father   . Cancer Maternal Grandfather   . Hypertension Brother   . Diabetes Paternal Aunt   . Diabetes Paternal Uncle   . Diabetes Paternal Grandmother   . Diabetes Paternal Grandfather    History  Substance Use Topics  . Smoking status: Never Smoker   . Smokeless tobacco: Never Used  . Alcohol Use: No   OB History    Gravida Para Term Preterm AB TAB SAB Ectopic Multiple Living   Review of Systems  Respiratory: Positive for chest tightness and shortness of breath.   All other systems reviewed and are negative.     Allergies  Penicillins  Home Medications   Prior to Admission medications   Medication Sig Start Date End Date Taking? Authorizing Provider  atorvastatin (LIPITOR) 40 MG tablet Take 1 tablet (40 mg total) by mouth daily at 6 PM. 08/31/14   Abram Sander, MD  glucose blood (ACCU-CHEK SMARTVIEW) test strip Check sugar 3 times a day Patient not taking: Reported on 12/16/2014 12/16/14   Nani Ravens, MD  insulin NPH-regular Human (NOVOLIN 70/30) (70-30) 100 UNIT/ML injection Inject 30-55 Units into the skin 2 (two) times daily with a meal. Inject 55 Units in morning and 30 units in evening. 10/23/14   [place], DO  lisinopril (PRINIVIL,ZESTRIL) 40 MG tablet Take 1 tablet (40 mg total) by mouth daily. 10/23/14   Araceli Bouche,  DO   BP 166/88 mmHg  Pulse 100  Temp(Src) 97.8 F (36.6 C) (Oral)  Resp 22  Ht 5\' 4"  (1.626 m)  Wt 236 lb (107.049 kg)  BMI 40.49 kg/m2  SpO2 100%  LMP 01/10/2015   Physical Exam  Constitutional: She is oriented to person, place, and time. She appears well-developed and well-nourished. No distress.  HENT:  Head: Normocephalic and atraumatic.  Mouth/Throat: Oropharynx is clear and moist.  Dry mucous membranes  Eyes: Conjunctivae and EOM are normal. Pupils are equal, round, and reactive to light.  Neck: Normal range of motion. Neck supple.  Cardiovascular: Normal rate, regular rhythm and  normal heart sounds.   Pulmonary/Chest: Effort normal and breath sounds normal. No respiratory distress. She has no wheezes.  Mildly tachypniec but not hypoxic; lungs clear  Abdominal: Soft. Bowel sounds are normal. There is no tenderness. There is no guarding.  Musculoskeletal: Normal range of motion. She exhibits no edema.  Neurological: She is alert and oriented to person, place, and time.  Skin: Skin is warm and dry. She is not diaphoretic.  Psychiatric: She has a normal mood and affect.  Nursing note and vitals reviewed.   ED Course  Procedures (including critical care time)  CRITICAL CARE Performed by: Garlon HatchetSANDERS, Verlinda Slotnick M   Total critical care time: 45  Critical care time was exclusive of separately billable procedures and treating other patients.  Critical care was necessary to treat or prevent imminent or life-threatening deterioration.  Critical care was time spent personally by me on the following activities: development of treatment plan with patient and/or surrogate as well as nursing, discussions with consultants, evaluation of patient's response to treatment, examination of patient, obtaining history from patient or surrogate, ordering and performing treatments and interventions, ordering and review of laboratory studies, ordering and review of radiographic studies, pulse oximetry and re-evaluation of patient's condition.  Medications  dextrose 5 %-0.45 % sodium chloride infusion (not administered)  insulin regular (NOVOLIN R,HUMULIN R) 250 Units in sodium chloride 0.9 % 250 mL (1 Units/mL) infusion (not administered)  0.9 %  sodium chloride infusion (not administered)    Followed by  0.9 %  sodium chloride infusion (not administered)  sodium chloride 0.9 % bolus 1,000 mL (1,000 mLs Intravenous New Bag/Given 01/17/15 0920)  lisinopril (PRINIVIL,ZESTRIL) tablet 40 mg (40 mg Oral Given 01/17/15 0941)   Labs Review Labs Reviewed  CBC WITH DIFFERENTIAL/PLATELET - Abnormal;  Notable for the following:    WBC 17.0 (*)    RBC 5.42 (*)    Hemoglobin 15.8 (*)    HCT 48.8 (*)    Neutrophils Relative % 85 (*)    Neutro Abs 14.5 (*)    All other components within normal limits  BASIC METABOLIC PANEL - Abnormal; Notable for the following:    Sodium 132 (*)    CO2 10 (*)    Glucose, Bld 554 (*)    Creatinine, Ser 2.07 (*)    GFR calc non Af Amer 30 (*)    GFR calc Af Amer 35 (*)    Anion gap 25 (*)    All other components within normal limits  KETONES, QUALITATIVE - Abnormal; Notable for the following:    Acetone, Bld SMALL (*)    All other components within normal limits  CBG MONITORING, ED - Abnormal; Notable for the following:    Glucose-Capillary 502 (*)    All other components within normal limits  I-STAT VENOUS BLOOD GAS, ED - Abnormal; Notable for the  following:    pH, Ven 6.917 (*)    pCO2, Ven 37.4 (*)    pO2, Ven 23.0 (*)    Bicarbonate 7.6 (*)    Acid-base deficit 25.0 (*)    All other components within normal limits  URINALYSIS, ROUTINE W REFLEX MICROSCOPIC  I-STAT TROPOININ, ED    Imaging Review Dg Chest Port 1 View  01/17/2015   CLINICAL DATA:  Shortness of breath.  EXAM: PORTABLE CHEST - 1 VIEW  COMPARISON:  10/18/2014  FINDINGS: Lungs are adequately inflated and otherwise clear. Cardiomediastinal silhouette, bones and soft tissues are within normal.  IMPRESSION: No active disease.   Electronically Signed   By: Elberta Fortis M.D.   On: 01/17/2015 09:18     EKG Interpretation   Date/Time:  Saturday January 17 2015 08:51:03 EST Ventricular Rate:  96 PR Interval:  158 QRS Duration: 87 QT Interval:  396 QTC Calculation: 500 R Axis:   38 Text Interpretation:  Sinus rhythm Biatrial enlargement Probable left  ventricular hypertrophy Anterior Q waves, possibly due to LVH Prolonged QT  interval Confirmed by DOCHERTY  MD, MEGAN ([id number]) on 01/17/2015 8:57:34 AM      MDM   Final diagnoses:  SOB (shortness of breath)  Diabetic  ketoacidosis without coma associated with type 2 diabetes mellitus  AKI (acute kidney injury)   34 year old female noncompliant with her diabetes medications over the past 2 weeks. She has a history of same. On arrival, patient is awake and alert. She is somewhat tachypnea but is not hypoxic. She endorses some chest tightness. No cardiac history. EKG sinus rhythm without ischemic changes. Initial CBG 502. Will obtain labs including CBC, BMP, venous blood gas, troponin.  CXR pending.  Labwork with leukocytosis of 17,000, glucose 554. Bicarbonate critically low at 10, anion gap 25.  SrCr also elevated at 2.07, baseline 0.8-- consistent with acute kidney injury. We'll continue IV fluids, patient started on glucose stabilizer. She will need hospital admission. Case discussed with family medicine resident who will admit for further management. Temporary admission orders placed.  Garlon Hatchet, PA-C 01/17/15 1437  Toy Cookey, MD 01/18/15 1610  Toy Cookey, MD 02/11/15 0900

## 2015-01-17 NOTE — ED Notes (Signed)
Admitting MD at bedside.

## 2015-01-17 NOTE — ED Notes (Signed)
Unable to obtain IV access X 2.  

## 2015-01-17 NOTE — ED Notes (Signed)
Pt moving to [facility]. Keshia, RN is familiar with pt and has been updated on her status.

## 2015-01-17 NOTE — ED Notes (Signed)
Pt c/o shortness of breath and being out of her diabetes medications. Pt also reports not taking her hypertension medications. Pt c/o increase thirst and increase urination.

## 2015-01-17 NOTE — ED Notes (Signed)
MD updated on patients increasing BP's and RR

## 2015-01-18 DIAGNOSIS — E1110 Type 2 diabetes mellitus with ketoacidosis without coma: Secondary | ICD-10-CM | POA: Insufficient documentation

## 2015-01-18 LAB — CBC WITH DIFFERENTIAL/PLATELET
Basophils Absolute: 0 10*3/uL (ref 0.0–0.1)
Basophils Relative: 0 % (ref 0–1)
EOS ABS: 0 10*3/uL (ref 0.0–0.7)
Eosinophils Relative: 0 % (ref 0–5)
HCT: 39.3 % (ref 36.0–46.0)
Hemoglobin: 13.3 g/dL (ref 12.0–15.0)
LYMPHS PCT: 15 % (ref 12–46)
Lymphs Abs: 2 10*3/uL (ref 0.7–4.0)
MCH: 28.9 pg (ref 26.0–34.0)
MCHC: 33.8 g/dL (ref 30.0–36.0)
MCV: 85.2 fL (ref 78.0–100.0)
MONOS PCT: 9 % (ref 3–12)
Monocytes Absolute: 1.1 10*3/uL — ABNORMAL HIGH (ref 0.1–1.0)
Neutro Abs: 9.9 10*3/uL — ABNORMAL HIGH (ref 1.7–7.7)
Neutrophils Relative %: 76 % (ref 43–77)
Platelets: 247 10*3/uL (ref 150–400)
RBC: 4.61 MIL/uL (ref 3.87–5.11)
RDW: 14 % (ref 11.5–15.5)
WBC: 13 10*3/uL — ABNORMAL HIGH (ref 4.0–10.5)

## 2015-01-18 LAB — GLUCOSE, CAPILLARY
GLUCOSE-CAPILLARY: 192 mg/dL — AB (ref 70–99)
GLUCOSE-CAPILLARY: 173 mg/dL — AB (ref 70–99)
GLUCOSE-CAPILLARY: 138 mg/dL — AB (ref 70–99)
GLUCOSE-CAPILLARY: 243 mg/dL — AB (ref 70–99)
Glucose-Capillary: 114 mg/dL — ABNORMAL HIGH (ref 70–99)
Glucose-Capillary: 128 mg/dL — ABNORMAL HIGH (ref 70–99)
Glucose-Capillary: 167 mg/dL — ABNORMAL HIGH (ref 70–99)
Glucose-Capillary: 169 mg/dL — ABNORMAL HIGH (ref 70–99)
Glucose-Capillary: 177 mg/dL — ABNORMAL HIGH (ref 70–99)
Glucose-Capillary: 184 mg/dL — ABNORMAL HIGH (ref 70–99)
Glucose-Capillary: 212 mg/dL — ABNORMAL HIGH (ref 70–99)
Glucose-Capillary: 230 mg/dL — ABNORMAL HIGH (ref 70–99)
Glucose-Capillary: 264 mg/dL — ABNORMAL HIGH (ref 70–99)

## 2015-01-18 LAB — BASIC METABOLIC PANEL
ANION GAP: 9 (ref 5–15)
Anion gap: 7 (ref 5–15)
Anion gap: 9 (ref 5–15)
Anion gap: 5 (ref 5–15)
BUN: 13 mg/dL (ref 6–23)
BUN: 11 mg/dL (ref 6–23)
BUN: 9 mg/dL (ref 6–23)
BUN: 7 mg/dL (ref 6–23)
CALCIUM: 8 mg/dL — AB (ref 8.4–10.5)
CALCIUM: 8.3 mg/dL — AB (ref 8.4–10.5)
CHLORIDE: 117 mmol/L — AB (ref 96–112)
CHLORIDE: 116 mmol/L — AB (ref 96–112)
CO2: 14 mmol/L — ABNORMAL LOW (ref 19–32)
CO2: 13 mmol/L — ABNORMAL LOW (ref 19–32)
CO2: 15 mmol/L — ABNORMAL LOW (ref 19–32)
CO2: 13 mmol/L — ABNORMAL LOW (ref 19–32)
CREATININE: 0.9 mg/dL (ref 0.50–1.10)
CREATININE: 1.08 mg/dL (ref 0.50–1.10)
Calcium: 8 mg/dL — ABNORMAL LOW (ref 8.4–10.5)
Calcium: 8.4 mg/dL (ref 8.4–10.5)
Chloride: 114 mmol/L — ABNORMAL HIGH (ref 96–112)
Chloride: 113 mmol/L — ABNORMAL HIGH (ref 96–112)
Creatinine, Ser: 0.95 mg/dL (ref 0.50–1.10)
Creatinine, Ser: 1.12 mg/dL — ABNORMAL HIGH (ref 0.50–1.10)
GFR calc non Af Amer: 66 mL/min — ABNORMAL LOW (ref >90)
GFR calc non Af Amer: 77 mL/min — ABNORMAL LOW (ref >90)
GFR calc non Af Amer: 63 mL/min — ABNORMAL LOW (ref >90)
GFR, EST AFRICAN AMERICAN: 73 mL/min — AB (ref >90)
GFR, EST AFRICAN AMERICAN: 76 mL/min — AB (ref >90)
GFR, EST AFRICAN AMERICAN: 89 mL/min — AB (ref >90)
GFR, EST NON AFRICAN AMERICAN: 82 mL/min — AB (ref >90)
GLUCOSE: 196 mg/dL — AB (ref 70–99)
Glucose, Bld: 201 mg/dL — ABNORMAL HIGH (ref 70–99)
Glucose, Bld: 109 mg/dL — ABNORMAL HIGH (ref 70–99)
Glucose, Bld: 277 mg/dL — ABNORMAL HIGH (ref 70–99)
Potassium: 3.5 mmol/L (ref 3.5–5.1)
Potassium: 3.7 mmol/L (ref 3.5–5.1)
Potassium: 3.4 mmol/L — ABNORMAL LOW (ref 3.5–5.1)
Potassium: 3.9 mmol/L (ref 3.5–5.1)
SODIUM: 136 mmol/L (ref 135–145)
Sodium: 137 mmol/L (ref 135–145)
Sodium: 137 mmol/L (ref 135–145)
Sodium: 135 mmol/L (ref 135–145)

## 2015-01-18 LAB — TROPONIN I
TROPONIN I: 0.03 ng/mL (ref <0.031)
Troponin I: <0.03 ng/mL (ref <0.031)

## 2015-01-18 MED ORDER — SODIUM CHLORIDE 0.9 % IV SOLN: INTRAVENOUS | Status: DC

## 2015-01-18 MED ORDER — SODIUM CHLORIDE 0.9 % IV SOLN
INTRAVENOUS | Status: AC
  Administered 2015-01-18 (×2): via INTRAVENOUS

## 2015-01-18 MED ORDER — INSULIN ASPART 100 UNIT/ML ~~LOC~~ SOLN
0.0000 [IU] | Freq: Every day | SUBCUTANEOUS | Status: DC
  Administered 2015-01-18: 2 [IU] via SUBCUTANEOUS

## 2015-01-18 MED ORDER — INSULIN ASPART 100 UNIT/ML ~~LOC~~ SOLN
0.0000 [IU] | Freq: Three times a day (TID) | SUBCUTANEOUS | Status: DC
  Administered 2015-01-18: 8 [IU] via SUBCUTANEOUS
  Administered 2015-01-18: 5 [IU] via SUBCUTANEOUS
  Administered 2015-01-19: 3 [IU] via SUBCUTANEOUS
  Administered 2015-01-19: 11 [IU] via SUBCUTANEOUS

## 2015-01-18 MED ORDER — CHLORHEXIDINE GLUCONATE CLOTH 2 % EX PADS
6.0000 | MEDICATED_PAD | Freq: Every day | CUTANEOUS | Status: DC
  Administered 2015-01-19: 6 via TOPICAL

## 2015-01-18 MED ORDER — LISINOPRIL 20 MG PO TABS
20.0000 mg | ORAL_TABLET | Freq: Every day | ORAL | Status: DC
  Administered 2015-01-18: 20 mg via ORAL
  Filled 2015-01-18 (×2): qty 1

## 2015-01-18 MED ORDER — MUPIROCIN 2 % EX OINT
1.0000 "application " | TOPICAL_OINTMENT | Freq: Two times a day (BID) | CUTANEOUS | Status: DC
  Administered 2015-01-18: 1 via NASAL
  Filled 2015-01-18: qty 22

## 2015-01-18 MED ORDER — INSULIN ASPART PROT & ASPART (70-30 MIX) 100 UNIT/ML ~~LOC~~ SUSP
30.0000 [IU] | Freq: Two times a day (BID) | SUBCUTANEOUS | Status: DC
  Administered 2015-01-18 – 2015-01-19 (×2): 30 [IU] via SUBCUTANEOUS

## 2015-01-18 MED ORDER — POTASSIUM CHLORIDE 10 MEQ/100ML IV SOLN
10.0000 meq | INTRAVENOUS | Status: AC
  Administered 2015-01-18 (×2): 10 meq via INTRAVENOUS
  Filled 2015-01-18: qty 100

## 2015-01-18 MED ORDER — POTASSIUM CHLORIDE 10 MEQ/100ML IV SOLN
10.0000 meq | INTRAVENOUS | Status: AC
  Administered 2015-01-18 (×2): 10 meq via INTRAVENOUS
  Filled 2015-01-18 (×2): qty 100

## 2015-01-18 MED ORDER — INSULIN ASPART PROT & ASPART (70-30 MIX) 100 UNIT/ML ~~LOC~~ SUSP
30.0000 [IU] | Freq: Once | SUBCUTANEOUS | Status: AC
  Administered 2015-01-18: 30 [IU] via SUBCUTANEOUS
  Filled 2015-01-18: qty 10

## 2015-01-18 MED ORDER — FAMOTIDINE 20 MG PO TABS: 20.0000 mg | ORAL_TABLET | Freq: Every day | ORAL | Status: DC

## 2015-01-18 NOTE — Progress Notes (Signed)
Family Medicine Teaching Service Daily Progress Note Intern Pager: [phone]  Patient name: Rebecca Rhodes Medical record number: [id number] Date of birth: 09/09/1981 Age: 34 y.o. Gender: female  Primary Care Provider: Tawni CarnesWight, Andrew, MD Consultants: none Code Status: Full  Pt Overview and Major Events to Date:  2/6 - admit to SDU for DKA, AG 25, acute resp distress 2/7 - transitioned off insulin drip to SQ 70/30 insulin, advance diet as tolerated       - Transfer to floor later today if tolerating PO and BMETs with AG remaining closed  Assessment and Plan:  Rebecca Rhodes is a 34 y.o. female presenting with respiratory distress found to be in DKA, likely due to out of home insulin x 2 weeks. PMH is significant for T2DM (poorly controlled with h/o non-adherence and previous hospitalizations with DKA, last 10/2014), HTN, HLD  # DKA, in uncontrolled T2DM, likely secondary to non-adherence to insulin - Improved - On admission, acute resp distress worsening x 2 days now with somnolence and clinical mod dehydration on exam, previously well without history concerning for infection. Confirmed DKA in ED with glucose >500, acidotic on VBG, BMET with AG 25 and bicarb 10. Suspected due to uncontrolled T2DM, last A1c 14.6 (Jan 2015), on 70/30 insulin at home (55u AM / 30u PM) ran out x 2 weeks. Afebrile, rather Hypothermic in ED, work-up negative for infection UA, CXR, no focal findings of infxn. Troponin (neg, neg, 0.03, neg) - Currently in SDU - acute DKA resolved with closed AG, resolved bicarb, clinically much improved today, no longer in resp distress, approaching baseline alertness, no new complaints - Repeated Potassium IV 10meq x 2 additional runs o/n - Transitioned to SQ insulin with 70/30 mix (home) 30u, 1st dose given at 0530, insulin drip ordered to continue for 2-3 hours overlap then stopped @ 0830 - Switch IVF to NS @ 100cc/hr x 12 hrs >> wean fluids to [facility] if tolerating PO today - Start clear  liquid diet >> AAT - Repeat BMET @ 1000, 1600 today - confirm gap closure on SQ insulin - Follow CBGs, titrate 70/30 insulin start at lower dose 30u BID (home 55 AM / 30 PM, unclear if adherent) - Add moderate SSI q ac + hs - Check A1c - Transfer out of SDU to floor later today if tolerating POs  # HTN - Initially hypertensive in ED 198/97, given Lisinopril x 1 dose. - Hydralazine IV 5mg  q 8 hr PRN ordered (SBP >190, DBP >130) - Resume Lisinopril at 20mg  daily (half home 40mg ) with resolved AKI  # AKI, suspected due to dehydration with DKA - Resolved - On admission elevated Cr to 2.00. Baseline Cr 0.6 to 0.7 - Creatinine down to 0.95 - Monitor Cr on BMET - IVF rehydration  FEN/GI: - NS @ 100cc/hr >> KVO after 12 hours if tolerating PO - Clear liquid diet >> advance to carb modified as tolerated  - Pepcid PO daily for GI prophylaxis in DKA Prophylaxis: SQ Heparin  Disposition: admitted to SDU for DKA, insulin stabilizer protocol, lab monitoring, transitioned to SQ insulin today, off insulin drip, advance diet monitor labs and transfer to floor later today if remains improved  Subjective:  Did well overnight. Reports significantly improved breathing today, no longer rapid breathing. Denies SOB, chest pain, abd pain, nausea / vomiting. Feels better but poor appetite remains.  Objective: Temp:  [96.8 F (36 C)-98.8 F (37.1 C)] 97.9 F (36.6 C) (02/07 0300) Pulse Rate:  [96-117] 116 (02/07 0700)  Resp:  [0-36] 23 (02/07 0300) BP: (115-198)/(75-141) 155/101 mmHg (02/07 0700) SpO2:  [99 %-100 %] 100 % (02/07 0700) Physical Exam: General: initially sleeping comfortably, easily aroused, NAD Cardiovascular: tachycardic, RRR, S1/S2, improved cap refill < 3 sec Respiratory: CTAB, no wheezing or focal crackles. Mild tachypnea present but overall significantly improved with non-labored breathing. Abdomen: soft, NTND, +active BS Extremities: non-tender, no edema Neuro: awakened,  significantly improved alertness, oriented x 3, grossly non-focal  Laboratory:  Recent Labs Lab 01/17/15 0833 01/18/15 0434  WBC 17.0* 13.0*  HGB 15.8* 13.3  HCT 48.8* 39.3  PLT 292 247    Recent Labs Lab 01/17/15 1900 01/18/15 0012 01/18/15 0434  NA 137 137 136  K 4.0 3.5 3.7  CL 116* 116* 114*  CO2 6* 14* 13*  BUN CREATININE 1.37* 1.08 0.95  CALCIUM 7.8* 8.0* 8.0*  GLUCOSE 221* 201* 196*   Urine pregnancy (negative)  Troponin - neg, neg, 0.03, neg  Blood acetone- small+  2/6 Blood Culture - pending  2/6 Urine culture - pending  2/6 VBG pH 6.917 / CO2 37.4 (low), O2 23 (low), bicarb 7.6 (inc)  Imaging/Diagnostic Tests:  2/6 EKG Sinus, 96, LVH, no acute ST-T wave changes, borderline prolong QT 500  2/6 CXR Portable 1v IMPRESSION: No active disease.  Saralyn Pilar, DO 01/18/2015, 8:43 AM PGY-2, [facility] [facility] FPTS Intern pager: [phone], text pages welcome

## 2015-01-18 NOTE — Progress Notes (Signed)
Utilization review completed.  

## 2015-01-18 NOTE — Progress Notes (Signed)
NURSING PROGRESS NOTE  Rebecca Rhodes [id number] Transfer Data: 01/18/2015 4:55 PM Attending Provider: Nestor RampSara L Neal, MD JXB:JYNWGPCP:Wight, Greig CastillaAndrew, MD Code Status: Full  Rebecca Rhodes is a 34 y.o. female patient transferred from 2H20 -No acute distress noted.  -No complaints of shortness of breath.  -No complaints of chest pain.   Cardiac Monitoring: Box # 25 in place. Cardiac monitor yields:sinus tachycardia.  Blood pressure 132/78, pulse 103, temperature 98.3 F (36.8 C), temperature source Oral, resp. rate 18, height 5\' 4"  (1.626 m), weight 107.049 kg (236 lb), last menstrual period 01/10/2015, SpO2 100 %, not currently breastfeeding.   IV Fluids:  IV in place, occlusive dsg intact without redness, IV cath forearm right, condition patent and no redness normal saline.   Allergies:  Penicillins  Past Medical History:   has a past medical history of HTN (hypertension) (06/04/2012); Hyperlipidemia (06/04/2012); Diabetes mellitus (06/04/2012); HSV-2 infection; DKA (diabetic ketoacidoses); and Acute respiratory failure.  Past Surgical History:   has past surgical history that includes Irrigation and debridement abscess (10/31/2012) and Cesarean section (N/A, 08/08/2013).  Social History:   reports that she has never smoked. She has never used smokeless tobacco. She reports that she does not drink alcohol or use illicit drugs.  Skin: intact   Patient/Family orientated to room. Information packet given to patient/family. Admission inpatient armband information verified with patient/family to include name and date of birth and placed on patient arm. Side rails up x 2, fall assessment and education completed with patient/family. Patient/family able to verbalize understanding of risk associated with falls and verbalized understanding to call for assistance before getting out of bed. Call light within reach. Patient/family able to voice and demonstrate understanding of unit orientation instructions.    Will  continue to evaluate and treat per MD orders.

## 2015-01-18 NOTE — Progress Notes (Signed)
Report received from [place]roy[place], [place] for transfer to [phone]

## 2015-01-19 LAB — BASIC METABOLIC PANEL
Anion gap: 7 (ref 5–15)
BUN: 5 mg/dL — ABNORMAL LOW (ref 6–23)
CO2: 16 mmol/L — AB (ref 19–32)
CREATININE: 0.84 mg/dL (ref 0.50–1.10)
Calcium: 8.1 mg/dL — ABNORMAL LOW (ref 8.4–10.5)
Chloride: 115 mmol/L — ABNORMAL HIGH (ref 96–112)
GFR calc Af Amer: >90 mL/min (ref >90)
GFR calc non Af Amer: 89 mL/min — ABNORMAL LOW (ref >90)
GLUCOSE: 234 mg/dL — AB (ref 70–99)
Potassium: 3.2 mmol/L — ABNORMAL LOW (ref 3.5–5.1)
SODIUM: 138 mmol/L (ref 135–145)

## 2015-01-19 LAB — URINE CULTURE

## 2015-01-19 LAB — CULTURE, BLOOD (ROUTINE X 2)

## 2015-01-19 LAB — HEMOGLOBIN A1C
Hgb A1c MFr Bld: 14.9 % — ABNORMAL HIGH (ref 4.8–5.6)
Mean Plasma Glucose: 381 mg/dL

## 2015-01-19 LAB — GLUCOSE, CAPILLARY: Glucose-Capillary: 185 mg/dL — ABNORMAL HIGH (ref 70–99)

## 2015-01-19 MED ORDER — INSULIN ASPART PROT & ASPART (70-30 MIX) 100 UNIT/ML ~~LOC~~ SUSP
30.0000 [IU] | Freq: Two times a day (BID) | SUBCUTANEOUS | Status: DC
Start: 2015-01-19 — End: 2015-10-13

## 2015-01-19 NOTE — Discharge Instructions (Signed)
Diabetic Ketoacidosis °Diabetic ketoacidosis (DKA) is a life-threatening complication of type 1 diabetes. It must be quickly recognized and treated. Treatment requires hospitalization. °CAUSES  °When there is no insulin in the body, glucose (sugar) cannot be used, and the body breaks down fat for energy. When fat breaks down, acids (ketones) build up in the blood. Very high levels of glucose and high levels of acids lead to severe loss of body fluids (dehydration) and other dangerous chemical changes. This stresses your vital organs and can cause coma or death. °SIGNS AND SYMPTOMS  °· Tiredness (fatigue). °· Weight loss. °· Excessive thirst. °· Ketones in your urine. °· Light-headedness. °· Fruity or sweet smelling breath. °· Excessive urination. °· Visual changes. °· Confusion or irritability. °· Nausea or vomiting. °· Rapid breathing. °· Stomachache or abdominal pain. °DIAGNOSIS  °Your health care provider will diagnose DKA based on your history, physical exam, and blood tests. The health care provider will check to see if you have another illness that caused you to go into DKA. Most of this will be done quickly in an emergency room. °TREATMENT  °· Fluid replacement to correct dehydration. °· Insulin. °· Correction of electrolytes, such as potassium and sodium. °· Antibiotic medicines. °PREVENTION °· Always take your insulin. Do not skip your insulin injections. °· If you are sick, treat yourself quickly. Your body often needs more insulin to fight the illness. °· Check your blood glucose regularly. °· Check urine ketones if your blood glucose is greater than 240 milligrams per deciliter (mg/dL). °· Do not use outdated (expired) insulin. °· If your blood glucose is high, drink plenty of fluids. This helps flush out ketones. °HOME CARE INSTRUCTIONS  °· If you are sick, follow the advice of your health care provider. °· To prevent dehydration, drink enough water and fluids to keep your urine clear or pale  yellow. °¨ If you cannot eat, alternate between drinking fluids with sugar (soda, juices, flavored gelatin) and salty fluids (broth, bouillon). °¨ If you can eat, follow your usual diet and drink sugar-free liquids (water, diet drinks). °· Always take your usual dose of insulin. If you cannot eat or if your glucose is getting too low, call your health care provider for further instructions. °· Continue to monitor your blood or urine ketones every 3-4 hours around the clock. Set your alarm clock or have someone wake you up. If you are too sick, have someone test it for you. °· Rest and avoid exercise. °SEEK MEDICAL CARE IF:  °· You have a fever. °· You have ketones in your urine, or your blood glucose is higher than a level your health care provider suggests. You may need extra insulin. Call your health care provider if you need advice on adjusting your insulin. °· You cannot drink at least a tablespoon (15 mL) of fluid every 15-20 minutes. °· You have been vomiting for more than 2 hours. °· You have symptoms of DKA: °¨ Fruity smelling breath. °¨ Breathing faster or slower. °¨ Becoming very sleepy. °SEEK IMMEDIATE MEDICAL CARE IF:  °· You have signs of dehydration: °¨ Decreased urination. °¨ Increased thirst. °¨ Dry skin and mouth. °¨ Light-headedness. °· Your blood glucose is very high (as advised by your health care provider) twice in a row. °· You faint. °· You have chest pain or trouble breathing. °· You have a sudden, severe headache. °· You have sudden weakness in one arm or one leg. °· You have sudden trouble speaking or swallowing. °· You   have vomiting or diarrhea that is getting worse after 3 hours.  You have abdominal pain. MAKE SURE YOU:   Understand these instructions.  Will watch your condition.  Will get help right away if you are not doing well or get worse. Document Released: 11/25/2000 Document Revised: 12/03/2013 Document Reviewed: 06/03/2009 [facility] Patient Information 2015 [employer]  [place]. This information is not intended to replace advice given to you by your health care provider. Make sure you discuss any questions you have with your health care provider.   WE are glad that you are doing much better.   It is important to take your insulin twice each day as prescribed. If you do not take your insulin, or if you cannot take your insulin, then please call the family practice center to be seen for a same day appointment if you begin to become hyperglycemic.   Thanks for letting us take care of you  Sincerely,  Devota Pacealeb Tait Balistreri, MD Family Medicine - PGY 1

## 2015-01-19 NOTE — Progress Notes (Signed)
Family Medicine Teaching Service Daily Progress Note Intern Pager: [phone]  Patient name: Rebecca Rhodes Medical record number: [id number] Date of birth: 06/17/1981 Age: 34 y.o. Gender: female  Primary Care Provider: Tawni CarnesWight, Andrew, MD Consultants: none Code Status: Full  Pt Overview and Major Events to Date:  2/6 - admit to SDU for DKA, AG 25, acute resp distress 2/7 - transitioned off insulin drip to SQ 70/30 insulin, advance diet as tolerated       - Transfer to floor later today if tolerating PO and BMETs with AG remaining closed  Assessment and Plan:  Rebecca SiKeisha Schul is a 34 y.o. female presenting with respiratory distress found to be in DKA, likely due to out of home insulin x 2 weeks. PMH is significant for T2DM (poorly controlled with h/o non-adherence and previous hospitalizations with DKA, last 10/2014), HTN, HLD  # DKA, in uncontrolled T2DM, likely secondary to non-adherence to insulin - Improved On admission, acute resp distress worsening x 2 days now with somnolence and clinical mod dehydration on exam, previously well without history concerning for infection. Confirmed DKA in ED with glucose >500, acidotic on VBG, BMET with AG 25 and bicarb 10. Suspected due to uncontrolled T2DM, last A1c 14.6 (Jan 2015), on 70/30 insulin at home (55u AM / 30u PM) ran out x 2 weeks. Afebrile, rather Hypothermic in ED, work-up negative for infection UA, CXR, no focal findings of infxn. Troponin (neg, neg, 0.03, neg) - Floor status. No acute complaints.  - Potassium stable at 3.2 - Transitioned to SQ insulin with 70/30 mix (home) 30u. S/P Insulin Drip. CBG's 200's to upper 100's. - IVF discontinued.  - Tolerating diet without N/V.  - BMET with continued gap closure on Sub Q insulin.  - Follow CBGs, titrate 70/30 insulin start at lower dose 30u BID (home 55 AM / 30 PM, unclear if adherent) - Moderate SSI q ac + hs - A1C pending.  - Plan to D/C on insulin regimen above.  - Case Management  consultation to evaluate outpatient resources given the reason for admission was running out of insulin two weeks prior to this episode.   # HTN - Initially hypertensive in ED 198/97, given Lisinopril x 1 dose. - Hydralazine IV 5mg  q 8 hr PRN ordered (SBP >190, DBP >130) - Resume Lisinopril at 20mg  daily (half home 40mg ) with resolved AKI  # AKI, suspected due to dehydration with DKA - Resolved - On admission elevated Cr to 2.00. Baseline Cr 0.6 to 0.7 - Creatinine down to 0.84 - Monitor Cr on BMET - IVF discontinued. Oral rehydration.   FEN/GI: - PO ad lib.  - Pepcid PO daily for GI prophylaxis in DKA Prophylaxis: SQ Heparin  Disposition: Home today if tolerating subq insulin regimen. Pending Care management consultation for outpatient resources.   Subjective:  Did well overnight. Reports significantly improved breathing today, no longer rapid breathing. Denies SOB, chest pain, abd pain, nausea / vomiting. Feels better but poor appetite remains.  Objective: Temp:  [97.7 F (36.5 C)-98.5 F (36.9 C)] 98.2 F (36.8 C) (02/08 1320) Pulse Rate:  [81-103] 100 (02/08 1056) Resp:  [16-20] 16 (02/08 1320) BP: (130-163)/(78-97) 146/92 mmHg (02/08 1320) SpO2:  [99 %-100 %] 100 % (02/08 1320) Physical Exam: General: NAD, AAOx3 Cardiovascular:RRR, no MGR, Normal S1/S2.  Respiratory: CTA B, No wheezes, crackles, or rales. Appropriate rate, unlabored. Abdomen: soft, NTND, +active BS Extremities: non-tender, no edema Neuro: AAOx3, grossly non-focal  Laboratory:  Recent Labs Lab 01/17/15 0833 01/18/15 0434  WBC 17.0* 13.0*  HGB 15.8* 13.3  HCT 48.8* 39.3  PLT 292 247    Recent Labs Lab 01/18/15 0900 01/18/15 1746 01/19/15 0850  NA 137 135 138  K 3.4* 3.9 3.2*  CL 117* 113* 115*  CO2 15* 13* 16*  BUN 9 7 5*  CREATININE 0.90 1.12* 0.84  CALCIUM 8.3* 8.4 8.1*  GLUCOSE 109* 277* 234*   Urine pregnancy (negative)  Troponin - neg, neg, 0.03, neg  Blood acetone-  small+  2/6 Blood Culture - NGTD x 2 days.   2/6 Urine culture - Multiple Bacteria.    2/6 VBG pH 6.917 / CO2 37.4 (low), O2 23 (low), bicarb 7.6 (inc)  Imaging/Diagnostic Tests:  2/6 EKG Sinus, 96, LVH, no acute ST-T wave changes, borderline prolong QT 500  2/6 CXR Portable 1v IMPRESSION: No active disease.  Yolande Jolly, MD 01/19/2015, 1:24 PM PGY-2, [facility] FPTS Intern pager: [phone], text pages welcome

## 2015-01-19 NOTE — Progress Notes (Addendum)
Inpatient Diabetes Program Recommendations  AACE/ADA: New Consensus Statement on Inpatient Glycemic Control (2013)  Target Ranges:  Prepandial:   less than 140 mg/dL      Peak postprandial:   less than 180 mg/dL (1-2 hours)      Critically ill patients:  140 - 180 mg/dL   Results for Rebecca Rhodes, Nusrat (MRN [id number]) as of 01/19/2015 15:52  Ref. Range 01/18/2015 07:52 01/18/2015 08:43 01/18/2015 12:09 01/18/2015 15:57 01/18/2015 17:00 01/18/2015 22:12 01/19/2015 07:46  Glucose-Capillary Latest Range: 70-99 mg/dL 409128 (H) 811114 (H) 914212 (H) 243 (H) 264 (H) 230 (H) 185 (H)    Diabetes history: DM 2 Outpatient Diabetes medications: Novolin 70/30 55units QAM, 30 units QPM Current orders for Inpatient glycemic control: 70/30 30 units BID, Novolog 0-15 TID, Novolog 0-5 units QHS  Inpatient Diabetes Program Recommendations Insulin - Basal: Patients blood glucose is still elevated during the day. Please consider adjusting 70/30 dose to 70/30 40 units QAM and 30 units QPM. If patient is to be discharged soon, please consider Novolin 70/30 near her home dose if not the full home dose. Not sure why patient ran out of insulin. 70/30 can be obtained without a prescription at [facility] for $25.  Thanks,  Christena DeemShannon Crystalmarie Yasin RN, MSN, [facility] Inpatient Diabetes Coordinator Team Pager [phone]

## 2015-01-19 NOTE — Discharge Summary (Signed)
[facility]  Patient name: Rebecca Rhodes Medical record number: [id number] Date of birth: 05/28/1981 Age: 34 y.o. Gender: female Date of Admission: 01/17/2015  Date of Discharge: 01/19/2015 Admitting Physician: Nestor RampSara L Neal, MD  Primary Care Provider: Tawni CarnesWight, Andrew, MD Consultants: None  Indication for Hospitalization: Diabetic Ketoacidosis  Discharge Diagnoses/Problem List:  DKA - resolved Hyperlipidemia Diabetes Type II HTN Acute Kidney Injury  Disposition: Discharge to Home  Discharge Condition: Stable  Discharge Exam:  Physical Exam: General: NAD, AAOx3 Cardiovascular:RRR, no MGR, Normal S1/S2.  Respiratory: CTA B, No wheezes, crackles, or rales. Appropriate rate, unlabored. Abdomen: soft, NTND, +active BS Extremities: non-tender, no edema Neuro: AAOx3, grossly non-focal  Brief Hospital Course:  Rebecca Rhodes is a 34 y.o. female presenting with respiratory distress found to be in DKA, likely due to being out of home insulin x 2 weeks. PMH is significant for T2DM (poorly controlled with h/o non-adherence and previous hospitalizations with DKA, last 10/2014), HTN, HLD.   DKA Secondary to Non-Adherence to Insulin -  Pt. Was admitted with acute respiratory distress in the setting of somnolence and clinically moderate dehydration to exam. She was found to have an elevated glucose in the ED > 500, acidotic on VBG, BMET with anion Gap of 25 and bicarb of 10. She mentioned that she did not having insulin at home for 2 weeks due to not being able to get another script from her pharmacy due to insurance issues. Her last A1C was 14.6 and her current A1C was pending at discharge. She was admitted to the Step Down unit where she was started on IVF, Insulin drip with some glucose, as well as potassium repletion. She initially had a potassium of 2 that subsequently resolved with repletion. Her electrolytes and VBG's were monitored closely and she  subsequently closed her anion gap. She was then transitioned from insulin drip to subq insulin. She is normally on 70/30 insulin at home 55 units in the am, and 30 units in the pm. She was switched to 30 units BID at discharge. This was in order to avoid hypoglycemia with a plan to have her continue outpatient follow up and titration of her insulin as needed. IVF were discontinued. She had an A1C pending at discharge. She was given new prescriptions for her medications.   AKI  - Pt. Was admitted with elevated creatinine near 2 that subsequently improved with IVF to 0.84. This was thought to be from hypoperfusion due to dehydration from glucogenic diuresis. She had excellent urine output prior to discharge and was found to be safe and stable for discharge home.   HTN - Pt. Hypertensive in the ED to 198/97. She was given lisinopril for one dose which was discontinued during her AKI. She was given Hydralazine IV for systolic blood pressures greater than 180. She then resumed her home dose of lisinopril prior to discharge.   HLD  - on atorvastatin. Continued here.   Issues for Follow Up:  1. Follow up whether she was able to get her insulin and compliance with regimen.  2. Follow up kidney function.   Significant Procedures: None  Significant Labs and Imaging:   Recent Labs Lab 01/17/15 0833 01/18/15 0434  WBC 17.0* 13.0*  HGB 15.8* 13.3  HCT 48.8* 39.3  PLT 292 247    Recent Labs Lab 01/17/15 1332  01/18/15 0012 01/18/15 0434 01/18/15 0900 01/18/15 1746 01/19/15 0850  NA 136  < > 137 136 137 135 138  K  4.5  < > 3.5 3.7 3.4* 3.9 3.2*  CL 109  < > 116* 114* 117* 113* 115*  CO2 9*  < > 14* 13* 15* 13* 16*  GLUCOSE 405*  < > 201* 196* 109* 277* 234*  BUN 20  < > 5*  CREATININE 1.71*  < > 1.08 0.95 0.90 1.12* 0.84  CALCIUM 7.7*  < > 8.0* 8.0* 8.3* 8.4 8.1*  MG 2.1  --   --   --   --   --   --   PHOS 4.7*  --   --   --   --   --   --   < > = values in this interval not  displayed.    Results/Tests Pending at Time of Discharge: None  Discharge Medications:    Medication List    STOP taking these medications        insulin NPH-regular Human (70-30) 100 UNIT/ML injection  Commonly known as:  NOVOLIN 70/30      TAKE these medications        atorvastatin 40 MG tablet  Commonly known as:  LIPITOR  Take 1 tablet (40 mg total) by mouth daily at 6 PM.     glucose blood test strip  Commonly known as:  ACCU-CHEK SMARTVIEW  Check sugar 3 times a day     insulin aspart protamine- aspart (70-30) 100 UNIT/ML injection  Commonly known as:  NOVOLOG MIX 70/30  Inject 0.3 mLs (30 Units total) into the skin 2 (two) times daily with a meal.     lisinopril 40 MG tablet  Commonly known as:  PRINIVIL,ZESTRIL  Take 1 tablet (40 mg total) by mouth daily.        Discharge Instructions: Please refer to Patient Instructions section of EMR for full details.  Patient was counseled important signs and symptoms that should prompt return to medical care, changes in medications, dietary instructions, activity restrictions, and follow up appointments.   Follow-Up Appointments: Follow-up Information    Follow up with Tawni Carnes, MD On 02/09/2015.   Specialty:  Family Medicine   Why:  Hospital Follow Up - 2:45 PM   Contact information:   [address] [place] [address] [phone]       Yolande Jolly, MD 01/19/2015, 5:04 PM PGY-1, [facility]

## 2015-01-20 LAB — GLUCOSE, CAPILLARY
Glucose-Capillary: 339 mg/dL — ABNORMAL HIGH (ref 70–99)
Glucose-Capillary: 294 mg/dL — ABNORMAL HIGH (ref 70–99)

## 2015-01-23 LAB — CULTURE, BLOOD (ROUTINE X 2): Culture: NO GROWTH

## 2015-01-25 LAB — CULTURE, BLOOD (ROUTINE X 2): Culture: NO GROWTH

## 2015-02-09 ENCOUNTER — Inpatient Hospital Stay: Payer: Medicaid Other | Admitting: Family Medicine

## 2015-06-08 ENCOUNTER — Other Ambulatory Visit: Payer: Self-pay

## 2015-10-13 ENCOUNTER — Other Ambulatory Visit: Payer: Self-pay | Admitting: Family Medicine

## 2015-10-13 MED ORDER — INSULIN ASPART PROT & ASPART (70-30 MIX) 100 UNIT/ML ~~LOC~~ SUSP: 30.0000 [IU] | Freq: Two times a day (BID) | SUBCUTANEOUS | Status: DC

## 2015-10-13 NOTE — Telephone Encounter (Signed)
Pt called and needs a refill on her [facility] [address]

## 2015-12-28 ENCOUNTER — Inpatient Hospital Stay (HOSPITAL_COMMUNITY)
Admission: EM | Admit: 2015-12-28 | Discharge: 2016-01-05 | DRG: 570 | Disposition: A | Discharge status: Home Health | Payer: Medicaid Other | Attending: Family Medicine | Admitting: Family Medicine

## 2015-12-28 ENCOUNTER — Encounter (HOSPITAL_COMMUNITY): Payer: Self-pay | Admitting: *Deleted

## 2015-12-28 DIAGNOSIS — Z9114 Patient's other noncompliance with medication regimen: Secondary | ICD-10-CM

## 2015-12-28 DIAGNOSIS — Z8249 Family history of ischemic heart disease and other diseases of the circulatory system: Secondary | ICD-10-CM

## 2015-12-28 DIAGNOSIS — Z79899 Other long term (current) drug therapy: Secondary | ICD-10-CM

## 2015-12-28 DIAGNOSIS — E118 Type 2 diabetes mellitus with unspecified complications: Secondary | ICD-10-CM

## 2015-12-28 DIAGNOSIS — Z91120 Patient's intentional underdosing of medication regimen due to financial hardship: Secondary | ICD-10-CM

## 2015-12-28 DIAGNOSIS — Z823 Family history of stroke: Secondary | ICD-10-CM

## 2015-12-28 DIAGNOSIS — Z6836 Body mass index (BMI) 36.0-36.9, adult: Secondary | ICD-10-CM

## 2015-12-28 DIAGNOSIS — R7989 Other specified abnormal findings of blood chemistry: Secondary | ICD-10-CM | POA: Insufficient documentation

## 2015-12-28 DIAGNOSIS — E876 Hypokalemia: Secondary | ICD-10-CM | POA: Diagnosis not present

## 2015-12-28 DIAGNOSIS — IMO0002 Reserved for concepts with insufficient information to code with codable children: Secondary | ICD-10-CM

## 2015-12-28 DIAGNOSIS — D649 Anemia, unspecified: Secondary | ICD-10-CM | POA: Diagnosis not present

## 2015-12-28 DIAGNOSIS — L0291 Cutaneous abscess, unspecified: Secondary | ICD-10-CM | POA: Insufficient documentation

## 2015-12-28 DIAGNOSIS — E860 Dehydration: Secondary | ICD-10-CM | POA: Diagnosis present

## 2015-12-28 DIAGNOSIS — I10 Essential (primary) hypertension: Secondary | ICD-10-CM | POA: Diagnosis present

## 2015-12-28 DIAGNOSIS — I2489 Other forms of acute ischemic heart disease: Secondary | ICD-10-CM | POA: Insufficient documentation

## 2015-12-28 DIAGNOSIS — E1165 Type 2 diabetes mellitus with hyperglycemia: Secondary | ICD-10-CM

## 2015-12-28 DIAGNOSIS — E1310 Other specified diabetes mellitus with ketoacidosis without coma: Secondary | ICD-10-CM | POA: Diagnosis present

## 2015-12-28 DIAGNOSIS — E1110 Type 2 diabetes mellitus with ketoacidosis without coma: Secondary | ICD-10-CM | POA: Diagnosis present

## 2015-12-28 DIAGNOSIS — L03317 Cellulitis of buttock: Secondary | ICD-10-CM | POA: Diagnosis present

## 2015-12-28 DIAGNOSIS — N179 Acute kidney failure, unspecified: Secondary | ICD-10-CM | POA: Diagnosis present

## 2015-12-28 DIAGNOSIS — Z880 Allergy status to penicillin: Secondary | ICD-10-CM

## 2015-12-28 DIAGNOSIS — E785 Hyperlipidemia, unspecified: Secondary | ICD-10-CM | POA: Diagnosis present

## 2015-12-28 DIAGNOSIS — I248 Other forms of acute ischemic heart disease: Secondary | ICD-10-CM | POA: Diagnosis present

## 2015-12-28 DIAGNOSIS — Z833 Family history of diabetes mellitus: Secondary | ICD-10-CM

## 2015-12-28 DIAGNOSIS — Z794 Long term (current) use of insulin: Secondary | ICD-10-CM

## 2015-12-28 DIAGNOSIS — R079 Chest pain, unspecified: Secondary | ICD-10-CM | POA: Insufficient documentation

## 2015-12-28 DIAGNOSIS — L0231 Cutaneous abscess of buttock: Secondary | ICD-10-CM | POA: Diagnosis present

## 2015-12-28 DIAGNOSIS — I160 Hypertensive urgency: Secondary | ICD-10-CM | POA: Diagnosis present

## 2015-12-28 DIAGNOSIS — Z8614 Personal history of Methicillin resistant Staphylococcus aureus infection: Secondary | ICD-10-CM

## 2015-12-28 DIAGNOSIS — R778 Other specified abnormalities of plasma proteins: Secondary | ICD-10-CM | POA: Insufficient documentation

## 2015-12-28 DIAGNOSIS — B951 Streptococcus, group B, as the cause of diseases classified elsewhere: Secondary | ICD-10-CM | POA: Diagnosis present

## 2015-12-28 LAB — I-STAT VENOUS BLOOD GAS, ED
Acid-base deficit: 20 mmol/L — ABNORMAL HIGH (ref 0.0–2.0)
Bicarbonate: 7.8 mEq/L — ABNORMAL LOW (ref 20.0–24.0)
O2 SAT: 26 %
PCO2 VEN: 25.6 mmHg — AB (ref 45.0–50.0)
TCO2: 9 mmol/L (ref 0–100)
pH, Ven: 7.094 — CL (ref 7.250–7.300)
pO2, Ven: 23 mmHg — CL (ref 30.0–45.0)

## 2015-12-28 LAB — BASIC METABOLIC PANEL
ANION GAP: 28 — AB (ref 5–15)
Anion gap: 32 — ABNORMAL HIGH (ref 5–15)
BUN: 10 mg/dL (ref 6–20)
BUN: 14 mg/dL (ref 6–20)
CALCIUM: 9.1 mg/dL (ref 8.9–10.3)
CHLORIDE: 90 mmol/L — AB (ref 101–111)
CO2: 11 mmol/L — ABNORMAL LOW (ref 22–32)
CO2: 9 mmol/L — ABNORMAL LOW (ref 22–32)
CREATININE: 2.48 mg/dL — AB (ref 0.44–1.00)
Calcium: 8.9 mg/dL (ref 8.9–10.3)
Chloride: 92 mmol/L — ABNORMAL LOW (ref 101–111)
Creatinine, Ser: 2.27 mg/dL — ABNORMAL HIGH (ref 0.44–1.00)
GFR calc Af Amer: 28 mL/min — ABNORMAL LOW (ref >60)
GFR calc non Af Amer: 27 mL/min — ABNORMAL LOW (ref >60)
GFR, EST AFRICAN AMERICAN: 31 mL/min — AB (ref >60)
GFR, EST NON AFRICAN AMERICAN: 24 mL/min — AB (ref >60)
GLUCOSE: 748 mg/dL — AB (ref 65–99)
Glucose, Bld: 765 mg/dL (ref 65–99)
POTASSIUM: 4.5 mmol/L (ref 3.5–5.1)
Potassium: 4.2 mmol/L (ref 3.5–5.1)
SODIUM: 129 mmol/L — AB (ref 135–145)
SODIUM: 133 mmol/L — AB (ref 135–145)

## 2015-12-28 LAB — CBC
HEMATOCRIT: 39.4 % (ref 36.0–46.0)
Hemoglobin: 12.5 g/dL (ref 12.0–15.0)
MCH: 28.8 pg (ref 26.0–34.0)
MCHC: 31.7 g/dL (ref 30.0–36.0)
MCV: 90.8 fL (ref 78.0–100.0)
Platelets: 311 10*3/uL (ref 150–400)
RBC: 4.34 MIL/uL (ref 3.87–5.11)
RDW: 14.2 % (ref 11.5–15.5)
WBC: 18 10*3/uL — AB (ref 4.0–10.5)

## 2015-12-28 LAB — CBG MONITORING, ED
Glucose-Capillary: >600 mg/dL (ref 65–99)
Glucose-Capillary: >600 mg/dL (ref 65–99)

## 2015-12-28 MED ORDER — SODIUM CHLORIDE 0.9 % IV SOLN
1000.0000 mL | Freq: Once | INTRAVENOUS | Status: AC
  Administered 2015-12-28: 1000 mL via INTRAVENOUS

## 2015-12-28 MED ORDER — SODIUM CHLORIDE 0.9 % IV SOLN
1000.0000 mL | INTRAVENOUS | Status: DC
  Administered 2015-12-29 – 2016-01-03 (×7): 1000 mL via INTRAVENOUS

## 2015-12-28 MED ORDER — DEXTROSE-NACL 5-0.45 % IV SOLN
INTRAVENOUS | Status: DC
Start: 2015-12-28 — End: 2015-12-30
  Administered 2015-12-29: 10:00:00 via INTRAVENOUS

## 2015-12-28 MED ORDER — SODIUM CHLORIDE 0.9 % IV SOLN
INTRAVENOUS | Status: DC
  Administered 2015-12-29: via INTRAVENOUS
  Filled 2015-12-28: qty 2.5

## 2015-12-28 MED ORDER — SODIUM CHLORIDE 0.9 % IV BOLUS (SEPSIS)
1000.0000 mL | Freq: Once | INTRAVENOUS | Status: AC
  Administered 2015-12-29: 1000 mL via INTRAVENOUS

## 2015-12-28 MED ORDER — CLINDAMYCIN PHOSPHATE 600 MG/50ML IV SOLN
600.0000 mg | Freq: Once | INTRAVENOUS | Status: AC
  Administered 2015-12-28: 600 mg via INTRAVENOUS
  Filled 2015-12-28: qty 50

## 2015-12-28 NOTE — ED Notes (Signed)
Charge notified of abnormal lab values. Attempting to find pt bed per charge

## 2015-12-28 NOTE — ED Notes (Addendum)
Pt states abscess in between buttocks for several weeks.  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  CBG in triage reads "hi".  Pt states she hasn't had money to buy her insulin.

## 2015-12-28 NOTE — ED Provider Notes (Signed)
CSN: [id number]     Arrival date & time 12/28/15  1525 History   First MD Initiated Contact with Patient 12/28/15 2228     Chief Complaint  Patient presents with  . Abscess  . Hyperglycemia     (Consider location/radiation/quality/duration/timing/severity/associated sxs/prior Treatment) HPI Comments: 35 y.o. Female with history of DM, hypertension presents for right buttocks abscess. The patient presented to triage for her right buttocks abscess. She believes that it is draining at this time. This is a recurrent issue for her. While in triage blood work was drawn that showed that the patient was in DKA. She had a severely elevated glucose over 700. She also had an elevated anion gap. She reports that she's had difficulties with affording her insulin and that she has been spacing out the insulin or using less than she is supposed to. She's also been trying to buy a cheaper over-the-counter insulin. She denies fever. She reports feeling short of breath.   Past Medical History  Diagnosis Date  . HTN (hypertension) 06/04/2012  . Hyperlipidemia 06/04/2012  . Diabetes mellitus (HCC) 06/04/2012  . HSV-2 infection   . DKA (diabetic ketoacidoses) (HCC)   . Acute respiratory failure [facility])    Past Surgical History  Procedure Laterality Date  . Irrigation and debridement abscess  10/31/2012    Procedure: IRRIGATION AND DEBRIDEMENT ABSCESS;  Surgeon: Cherylynn Ridges, MD;  Location: [facility] OR;  Service: General;  Laterality: Bilateral;  . Cesarean section N/A 08/08/2013    Procedure: CESAREAN SECTION;  Surgeon: Tereso Newcomer, MD;  Location: [facility] ORS;  Service: Obstetrics;  Laterality: N/A;   Family History  Problem Relation Age of Onset  . Hypertension Mother   . Stroke Mother   . Heart disease Father   . Diabetes Father   . Cancer Maternal Grandfather   . Hypertension Brother   . Diabetes Paternal Aunt   . Diabetes Paternal Uncle   . Diabetes Paternal Grandmother   . Diabetes Paternal Grandfather     Social History  Substance Use Topics  . Smoking status: Never Smoker   . Smokeless tobacco: Never Used  . Alcohol Use: No   OB History    Gravida Para Term Preterm AB TAB SAB Ectopic Multiple Living   1 1  1      1      Review of Systems  Constitutional: Positive for appetite change and fatigue. Negative for fever and diaphoresis.  HENT: Negative for congestion, postnasal drip and rhinorrhea.   Respiratory: Positive for shortness of breath. Negative for cough and chest tightness.   Cardiovascular: Negative for chest pain and palpitations.  Gastrointestinal: Negative for nausea, vomiting, abdominal pain and diarrhea.  Endocrine: Positive for polydipsia and polyuria.  Genitourinary: Negative for dysuria, urgency and frequency.  Musculoskeletal: Negative for myalgias and back pain.  Skin: Positive for wound.  Neurological: Negative for dizziness, weakness and headaches.  Hematological: Does not bruise/bleed easily.      Allergies  Penicillins  Home Medications   Prior to Admission medications   Medication Sig Start Date End Date Taking? Authorizing Provider  insulin NPH-regular Human (NOVOLIN 70/30) (70-30) 100 UNIT/ML injection Inject 50 Units into the skin 2 (two) times daily with a meal.   Yes Historical Provider, MD  lisinopril (PRINIVIL,ZESTRIL) 40 MG tablet Take 1 tablet (40 mg total) by mouth daily. 10/23/14  Yes Doral N Rumley, DO  atorvastatin (LIPITOR) 40 MG tablet Take 1 tablet (40 mg total) by mouth daily at 6 PM. 08/31/14  Abram Sander, MD  glucose blood (ACCU-CHEK SMARTVIEW) test strip Check sugar 3 times a day 12/16/14   Nani Ravens, MD  insulin aspart protamine- aspart (NOVOLOG MIX 70/30) (70-30) 100 UNIT/ML injection Inject 0.3 mLs (30 Units total) into the skin 2 (two) times daily with a meal. 10/13/15   Nani Ravens, MD   BP 167/102 mmHg  Pulse 115  Temp(Src) 97.9 F (36.6 C) (Oral)  Resp 18  Ht 5\' 4"  (1.626 m)  Wt 215 lb 3.2 oz (97.614 kg)  BMI  36.92 kg/m2  SpO2 100%  LMP 12/23/2015 Physical Exam  Constitutional: She is oriented to person, place, and time. She appears well-developed and well-nourished. She has a sickly appearance. No distress.  HENT:  Head: Normocephalic and atraumatic.  Right Ear: External ear normal.  Left Ear: External ear normal.  Nose: Nose normal.  Mouth/Throat: Oropharynx is clear and moist. No oropharyngeal exudate.  Eyes: EOM are normal. Pupils are equal, round, and reactive to light.  Neck: Normal range of motion. Neck supple.  Cardiovascular: Regular rhythm, normal heart sounds and intact distal pulses.  Tachycardia present.   No murmur heard. Pulmonary/Chest: Effort normal. Tachypnea noted. No respiratory distress. She has no wheezes. She has no rales.  Abdominal: Soft. She exhibits no distension. There is no tenderness.  Musculoskeletal: Normal range of motion. She exhibits no edema or tenderness.  Neurological: She is alert and oriented to person, place, and time.  Skin: Skin is warm and dry. No rash noted. She is not diaphoretic.     Vitals reviewed.   ED Course  Procedures (including critical care time)  CRITICAL CARE Performed by: Larna Daughters   Total critical care time: 35 minutes  Critical care time was exclusive of separately billable procedures and treating other patients.  Critical care was necessary to treat or prevent imminent or life-threatening deterioration.  Critical care was time spent personally by me on the following activities: development of treatment plan with patient and/or surrogate as well as nursing, discussions with consultants, evaluation of patient's response to treatment, examination of patient, obtaining history from patient or surrogate, ordering and performing treatments and interventions, ordering and review of laboratory studies, ordering and review of radiographic studies, pulse oximetry and re-evaluation of patient's condition.   Labs Review Labs  Reviewed  BASIC METABOLIC PANEL - Abnormal; Notable for the following:    Sodium 129 (*)    Chloride 90 (*)    CO2 11 (*)    Glucose, Bld 765 (*)    Creatinine, Ser 2.27 (*)    GFR calc non Af Amer 27 (*)    GFR calc Af Amer 31 (*)    Anion gap 28 (*)    All other components within normal limits  CBC - Abnormal; Notable for the following:    WBC 18.0 (*)    All other components within normal limits  BASIC METABOLIC PANEL - Abnormal; Notable for the following:    Sodium 133 (*)    Chloride 92 (*)    CO2 9 (*)    Glucose, Bld 748 (*)    Creatinine, Ser 2.48 (*)    GFR calc non Af Amer 24 (*)    GFR calc Af Amer 28 (*)    Anion gap 32 (*)    All other components within normal limits  CBG MONITORING, ED - Abnormal; Notable for the following:    Glucose-Capillary >600 (*)    All other components within normal limits  CBG MONITORING, ED - Abnormal; Notable for the following:    Glucose-Capillary >600 (*)    All other components within normal limits  I-STAT VENOUS BLOOD GAS, ED - Abnormal; Notable for the following:    pH, Ven 7.094 (*)    pCO2, Ven 25.6 (*)    pO2, Ven 23.0 (*)    Bicarbonate 7.8 (*)    Acid-base deficit 20.0 (*)    All other components within normal limits  WOUND CULTURE  CULTURE, BLOOD (ROUTINE X 2)  CULTURE, BLOOD (ROUTINE X 2)  URINALYSIS, ROUTINE W REFLEX MICROSCOPIC (NOT AT [facility])  POC URINE PREG, ED    Imaging Review No results found. I have personally reviewed and evaluated these images and lab results as part of my medical decision-making.   EKG Interpretation None      MDM  Patient was seen and evaluated at bedside. Patient is tachycardic with 2 small breathing on examination. Patient in DKA. Abscess on buttock within the gluteal fold and actively draining on its own. Blood cultures were sent and IV clindamycin was initiated. At this time it did not appear that lesion required further I&D at bedside although this may be needed depending on  response to treatment. Fluid hydration as well as insulin drip were initiated. Venous pH was severely low pH and elevated anion gap. Critical care was consulted who felt patient could go to stepdown unit at this time. Case was discussed with family medicine resident who agreed with admission. Patient was admitted under the care of Dr. Randolm IdolFletke to the stepdown unit. Final diagnoses:  Diabetic ketoacidosis without coma associated with other specified diabetes mellitus (HCC)  Abscess of buttock, right    1. DKA  2. Abscess of right buttock    Leta BaptistEmily Roe Nguyen, MD 12/29/15 [phone]

## 2015-12-29 DIAGNOSIS — Z880 Allergy status to penicillin: Secondary | ICD-10-CM | POA: Diagnosis not present

## 2015-12-29 DIAGNOSIS — Z8614 Personal history of Methicillin resistant Staphylococcus aureus infection: Secondary | ICD-10-CM | POA: Diagnosis not present

## 2015-12-29 DIAGNOSIS — Z794 Long term (current) use of insulin: Secondary | ICD-10-CM

## 2015-12-29 DIAGNOSIS — E1110 Type 2 diabetes mellitus with ketoacidosis without coma: Secondary | ICD-10-CM | POA: Diagnosis present

## 2015-12-29 DIAGNOSIS — I248 Other forms of acute ischemic heart disease: Secondary | ICD-10-CM | POA: Diagnosis present

## 2015-12-29 DIAGNOSIS — E785 Hyperlipidemia, unspecified: Secondary | ICD-10-CM

## 2015-12-29 DIAGNOSIS — I10 Essential (primary) hypertension: Secondary | ICD-10-CM

## 2015-12-29 DIAGNOSIS — B951 Streptococcus, group B, as the cause of diseases classified elsewhere: Secondary | ICD-10-CM | POA: Diagnosis present

## 2015-12-29 DIAGNOSIS — Z9114 Patient's other noncompliance with medication regimen: Secondary | ICD-10-CM | POA: Diagnosis not present

## 2015-12-29 DIAGNOSIS — E860 Dehydration: Secondary | ICD-10-CM | POA: Diagnosis present

## 2015-12-29 DIAGNOSIS — E1310 Other specified diabetes mellitus with ketoacidosis without coma: Secondary | ICD-10-CM | POA: Diagnosis present

## 2015-12-29 DIAGNOSIS — I160 Hypertensive urgency: Secondary | ICD-10-CM | POA: Diagnosis present

## 2015-12-29 DIAGNOSIS — D649 Anemia, unspecified: Secondary | ICD-10-CM | POA: Diagnosis not present

## 2015-12-29 DIAGNOSIS — E876 Hypokalemia: Secondary | ICD-10-CM | POA: Diagnosis not present

## 2015-12-29 DIAGNOSIS — L0231 Cutaneous abscess of buttock: Secondary | ICD-10-CM | POA: Diagnosis present

## 2015-12-29 DIAGNOSIS — N179 Acute kidney failure, unspecified: Secondary | ICD-10-CM | POA: Diagnosis present

## 2015-12-29 DIAGNOSIS — Z823 Family history of stroke: Secondary | ICD-10-CM | POA: Diagnosis not present

## 2015-12-29 DIAGNOSIS — Z833 Family history of diabetes mellitus: Secondary | ICD-10-CM | POA: Diagnosis not present

## 2015-12-29 DIAGNOSIS — Z6836 Body mass index (BMI) 36.0-36.9, adult: Secondary | ICD-10-CM | POA: Diagnosis not present

## 2015-12-29 DIAGNOSIS — L03317 Cellulitis of buttock: Secondary | ICD-10-CM | POA: Diagnosis present

## 2015-12-29 DIAGNOSIS — Z79899 Other long term (current) drug therapy: Secondary | ICD-10-CM | POA: Diagnosis not present

## 2015-12-29 DIAGNOSIS — Z8249 Family history of ischemic heart disease and other diseases of the circulatory system: Secondary | ICD-10-CM | POA: Diagnosis not present

## 2015-12-29 DIAGNOSIS — Z91120 Patient's intentional underdosing of medication regimen due to financial hardship: Secondary | ICD-10-CM | POA: Diagnosis not present

## 2015-12-29 LAB — BASIC METABOLIC PANEL
ANION GAP: 26 — AB (ref 5–15)
ANION GAP: 12 (ref 5–15)
ANION GAP: 9 (ref 5–15)
Anion gap: 12 (ref 5–15)
BUN: 11 mg/dL (ref 6–20)
BUN: 9 mg/dL (ref 6–20)
BUN: 9 mg/dL (ref 6–20)
BUN: 9 mg/dL (ref 6–20)
CALCIUM: 8.9 mg/dL (ref 8.9–10.3)
CALCIUM: 8.5 mg/dL — AB (ref 8.9–10.3)
CHLORIDE: 110 mmol/L (ref 101–111)
CHLORIDE: 110 mmol/L (ref 101–111)
CO2: 11 mmol/L — ABNORMAL LOW (ref 22–32)
CO2: 18 mmol/L — ABNORMAL LOW (ref 22–32)
CO2: 20 mmol/L — AB (ref 22–32)
CO2: 19 mmol/L — ABNORMAL LOW (ref 22–32)
CREATININE: 1.37 mg/dL — AB (ref 0.44–1.00)
Calcium: 8.5 mg/dL — ABNORMAL LOW (ref 8.9–10.3)
Calcium: 8.6 mg/dL — ABNORMAL LOW (ref 8.9–10.3)
Chloride: 103 mmol/L (ref 101–111)
Chloride: 110 mmol/L (ref 101–111)
Creatinine, Ser: 1.93 mg/dL — ABNORMAL HIGH (ref 0.44–1.00)
Creatinine, Ser: 1.13 mg/dL — ABNORMAL HIGH (ref 0.44–1.00)
Creatinine, Ser: 1.09 mg/dL — ABNORMAL HIGH (ref 0.44–1.00)
GFR calc Af Amer: 37 mL/min — ABNORMAL LOW (ref >60)
GFR calc Af Amer: >60 mL/min (ref >60)
GFR calc non Af Amer: >60 mL/min (ref >60)
GFR, EST AFRICAN AMERICAN: 57 mL/min — AB (ref >60)
GFR, EST NON AFRICAN AMERICAN: 32 mL/min — AB (ref >60)
GFR, EST NON AFRICAN AMERICAN: 49 mL/min — AB (ref >60)
GLUCOSE: 416 mg/dL — AB (ref 65–99)
GLUCOSE: 201 mg/dL — AB (ref 65–99)
GLUCOSE: 238 mg/dL — AB (ref 65–99)
Glucose, Bld: 243 mg/dL — ABNORMAL HIGH (ref 65–99)
POTASSIUM: 4.1 mmol/L (ref 3.5–5.1)
POTASSIUM: 3.5 mmol/L (ref 3.5–5.1)
POTASSIUM: 3.3 mmol/L — AB (ref 3.5–5.1)
Potassium: 3.5 mmol/L (ref 3.5–5.1)
SODIUM: 140 mmol/L (ref 135–145)
SODIUM: 140 mmol/L (ref 135–145)
Sodium: 142 mmol/L (ref 135–145)
Sodium: 138 mmol/L (ref 135–145)

## 2015-12-29 LAB — CBG MONITORING, ED
GLUCOSE-CAPILLARY: 167 mg/dL — AB (ref 65–99)
GLUCOSE-CAPILLARY: 216 mg/dL — AB (ref 65–99)
GLUCOSE-CAPILLARY: 368 mg/dL — AB (ref 65–99)
GLUCOSE-CAPILLARY: 517 mg/dL — AB (ref 65–99)
GLUCOSE-CAPILLARY: 583 mg/dL — AB (ref 65–99)
Glucose-Capillary: 335 mg/dL — ABNORMAL HIGH (ref 65–99)
Glucose-Capillary: 281 mg/dL — ABNORMAL HIGH (ref 65–99)
Glucose-Capillary: 250 mg/dL — ABNORMAL HIGH (ref 65–99)

## 2015-12-29 LAB — GLUCOSE, CAPILLARY
GLUCOSE-CAPILLARY: 174 mg/dL — AB (ref 65–99)
GLUCOSE-CAPILLARY: 201 mg/dL — AB (ref 65–99)
GLUCOSE-CAPILLARY: 206 mg/dL — AB (ref 65–99)
GLUCOSE-CAPILLARY: 383 mg/dL — AB (ref 65–99)
GLUCOSE-CAPILLARY: 413 mg/dL — AB (ref 65–99)
Glucose-Capillary: 201 mg/dL — ABNORMAL HIGH (ref 65–99)
Glucose-Capillary: 222 mg/dL — ABNORMAL HIGH (ref 65–99)
Glucose-Capillary: 224 mg/dL — ABNORMAL HIGH (ref 65–99)
Glucose-Capillary: 229 mg/dL — ABNORMAL HIGH (ref 65–99)
Glucose-Capillary: 249 mg/dL — ABNORMAL HIGH (ref 65–99)

## 2015-12-29 LAB — URINALYSIS, ROUTINE W REFLEX MICROSCOPIC
Bilirubin Urine: NEGATIVE
Ketones, ur: >80 mg/dL — AB
NITRITE: NEGATIVE
PH: 5 (ref 5.0–8.0)
Protein, ur: NEGATIVE mg/dL
SPECIFIC GRAVITY, URINE: 1.024 (ref 1.005–1.030)

## 2015-12-29 LAB — POC URINE PREG, ED: PREG TEST UR: NEGATIVE

## 2015-12-29 LAB — MRSA PCR SCREENING: MRSA by PCR: NEGATIVE

## 2015-12-29 LAB — URINE MICROSCOPIC-ADD ON

## 2015-12-29 LAB — PHOSPHORUS: PHOSPHORUS: 4.6 mg/dL (ref 2.5–4.6)

## 2015-12-29 LAB — MAGNESIUM: MAGNESIUM: 2.2 mg/dL (ref 1.7–2.4)

## 2015-12-29 MED ORDER — VANCOMYCIN HCL IN DEXTROSE 1-5 GM/200ML-% IV SOLN: 1000.0000 mg | Freq: Once | INTRAVENOUS | Status: DC

## 2015-12-29 MED ORDER — INSULIN ASPART 100 UNIT/ML ~~LOC~~ SOLN
0.0000 [IU] | Freq: Three times a day (TID) | SUBCUTANEOUS | Status: DC
  Administered 2015-12-29: 3 [IU] via SUBCUTANEOUS

## 2015-12-29 MED ORDER — ACETAMINOPHEN 325 MG PO TABS
650.0000 mg | ORAL_TABLET | Freq: Four times a day (QID) | ORAL | Status: DC | PRN
  Administered 2015-12-29 – 2015-12-31 (×6): 650 mg via ORAL
  Filled 2015-12-29 (×6): qty 2

## 2015-12-29 MED ORDER — INSULIN GLARGINE 100 UNIT/ML ~~LOC~~ SOLN
45.0000 [IU] | Freq: Every day | SUBCUTANEOUS | Status: DC
Start: 2015-12-30 — End: 2016-01-02
  Administered 2015-12-30 – 2016-01-01 (×3): 45 [IU] via SUBCUTANEOUS
  Filled 2015-12-29 (×4): qty 0.45

## 2015-12-29 MED ORDER — POTASSIUM CHLORIDE 10 MEQ/100ML IV SOLN
10.0000 meq | INTRAVENOUS | Status: AC
  Administered 2015-12-29 (×2): 10 meq via INTRAVENOUS
  Filled 2015-12-29: qty 100

## 2015-12-29 MED ORDER — SODIUM CHLORIDE 0.9 % IV SOLN: INTRAVENOUS | Status: AC

## 2015-12-29 MED ORDER — INFLUENZA VAC SPLIT QUAD 0.5 ML IM SUSY
0.5000 mL | PREFILLED_SYRINGE | INTRAMUSCULAR | Status: AC
  Administered 2015-12-30: 0.5 mL via INTRAMUSCULAR
  Filled 2015-12-29: qty 0.5

## 2015-12-29 MED ORDER — DEXTROSE 5 % IV SOLN
1.0000 g | Freq: Every day | INTRAVENOUS | Status: DC
  Filled 2015-12-29: qty 10

## 2015-12-29 MED ORDER — HYDRALAZINE HCL 20 MG/ML IJ SOLN: 5.0000 mg | Freq: Three times a day (TID) | INTRAMUSCULAR | Status: DC | PRN

## 2015-12-29 MED ORDER — PANTOPRAZOLE SODIUM 40 MG IV SOLR
40.0000 mg | INTRAVENOUS | Status: DC
  Administered 2015-12-29 – 2015-12-30 (×2): 40 mg via INTRAVENOUS
  Filled 2015-12-29 (×2): qty 40

## 2015-12-29 MED ORDER — VANCOMYCIN HCL IN DEXTROSE 1-5 GM/200ML-% IV SOLN: 1000.0000 mg | INTRAVENOUS | Status: DC

## 2015-12-29 MED ORDER — CLINDAMYCIN HCL 300 MG PO CAPS
300.0000 mg | ORAL_CAPSULE | Freq: Three times a day (TID) | ORAL | Status: DC
  Administered 2015-12-29 – 2016-01-01 (×10): 300 mg via ORAL
  Filled 2015-12-29 (×12): qty 1

## 2015-12-29 MED ORDER — DEXTROSE-NACL 5-0.45 % IV SOLN: INTRAVENOUS | Status: DC

## 2015-12-29 MED ORDER — METRONIDAZOLE IN NACL 5-0.79 MG/ML-% IV SOLN
500.0000 mg | Freq: Three times a day (TID) | INTRAVENOUS | Status: DC
  Filled 2015-12-29: qty 100

## 2015-12-29 MED ORDER — CIPROFLOXACIN HCL 500 MG PO TABS
500.0000 mg | ORAL_TABLET | Freq: Two times a day (BID) | ORAL | Status: DC
  Administered 2015-12-29 – 2015-12-31 (×5): 500 mg via ORAL
  Filled 2015-12-29 (×5): qty 1

## 2015-12-29 MED ORDER — INSULIN ASPART 100 UNIT/ML ~~LOC~~ SOLN
0.0000 [IU] | Freq: Every day | SUBCUTANEOUS | Status: DC
  Administered 2015-12-29: 5 [IU] via SUBCUTANEOUS
  Administered 2015-12-30: 2 [IU] via SUBCUTANEOUS
  Administered 2015-12-31 – 2016-01-03 (×4): 3 [IU] via SUBCUTANEOUS

## 2015-12-29 MED ORDER — INSULIN ASPART 100 UNIT/ML ~~LOC~~ SOLN
0.0000 [IU] | Freq: Three times a day (TID) | SUBCUTANEOUS | Status: DC
  Administered 2015-12-30 (×2): 15 [IU] via SUBCUTANEOUS
  Administered 2015-12-30: 7 [IU] via SUBCUTANEOUS
  Administered 2015-12-31 (×2): 11 [IU] via SUBCUTANEOUS
  Administered 2015-12-31: 7 [IU] via SUBCUTANEOUS
  Administered 2016-01-01: 20 [IU] via SUBCUTANEOUS
  Administered 2016-01-02: 11 [IU] via SUBCUTANEOUS
  Administered 2016-01-02 – 2016-01-03 (×3): 7 [IU] via SUBCUTANEOUS
  Administered 2016-01-03: 11 [IU] via SUBCUTANEOUS
  Administered 2016-01-03: 7 [IU] via SUBCUTANEOUS
  Administered 2016-01-04 (×2): 11 [IU] via SUBCUTANEOUS
  Administered 2016-01-04: 7 [IU] via SUBCUTANEOUS
  Administered 2016-01-05: 3 [IU] via SUBCUTANEOUS

## 2015-12-29 MED ORDER — INSULIN GLARGINE 100 UNIT/ML ~~LOC~~ SOLN
30.0000 [IU] | Freq: Every day | SUBCUTANEOUS | Status: DC
  Administered 2015-12-29: 30 [IU] via SUBCUTANEOUS
  Filled 2015-12-29: qty 0.3

## 2015-12-29 MED ORDER — HEPARIN SODIUM (PORCINE) 5000 UNIT/ML IJ SOLN
5000.0000 [IU] | Freq: Three times a day (TID) | INTRAMUSCULAR | Status: DC
  Administered 2015-12-29 – 2016-01-01 (×9): 5000 [IU] via SUBCUTANEOUS
  Filled 2015-12-29 (×9): qty 1

## 2015-12-29 MED ORDER — SULFAMETHOXAZOLE-TRIMETHOPRIM 800-160 MG PO TABS: 1.0000 | ORAL_TABLET | Freq: Two times a day (BID) | ORAL | Status: DC

## 2015-12-29 MED ORDER — POTASSIUM CHLORIDE 10 MEQ/100ML IV SOLN
10.0000 meq | INTRAVENOUS | Status: AC
  Filled 2015-12-29: qty 100

## 2015-12-29 MED ORDER — SODIUM CHLORIDE 0.9 % IV SOLN
INTRAVENOUS | Status: DC
  Administered 2015-12-31 – 2016-01-01 (×3): via INTRAVENOUS

## 2015-12-29 NOTE — Progress Notes (Signed)
Notified about elevated CBG after insulin given later the CBG was still elevate No new order received.

## 2015-12-29 NOTE — ED Notes (Signed)
Report called, but bed is not ready on accepting unit yet. RN to call when bed is ready.

## 2015-12-29 NOTE — Progress Notes (Signed)
Interim Progress Note: S: Doing well. No n/v. No abdominal pain.  O:  General: Tired-appearing CV: RRR, no murmurs Resp: CTAB, normal work of breathing Neuro: Awake, alert, oriented to person and place but not time  A/P: - Admit to stepdown unit - Continue insulin drip until gap closed x 2. Will start Lantus before stopping drip. - Now on D5, given that CBGs are <250 x 2 - CBGs q1hr - f/u A1c - Will de-escalate antibiotics for abscess.  - Holding Lisinopril. Restart when AKI improves  Willadean Carol, MD PGY-1

## 2015-12-29 NOTE — H&P (Signed)
Family Medicine Teaching [facility] Admission History and Physical Service Pager: [phone]  Patient name: Rebecca Rhodes Medical record number: [id number] Date of birth: 1981-03-01 Age: 35 y.o. Gender: female  Primary Care Provider: Tawni Carnes, MD Consultants: CCM Code Status: FULL (discussed on admission)  Chief Complaint: DKA  Assessment and Plan: Rebecca Rhodes is a 35 y.o. female presenting with DKA . PMH is significant for T2DM, HTN, HLD   # DKA: s/p 1 L NS in ED. Known history of T2DM.  Last admission 01/2015 for DKA.  Currently has buttock abscess.  Additionally, not taking insulin as prescribed due to finances. Glucose 748. Anion gap 28>32. VBG with pH 7.094, pO2 23.0, pCO2 25.6, bicarb 7.8. A1c 14.9 in 01/2015. K 4.5. Cr 2.48. UA with ketones, >1000 glucose, large Hgb.  Patient with kussmaul breathing, tachycardia to 124 and elevated BP 167/102 on admission. - Admit to SDU under Dr Randolm Idol - Cardiac monitoring - Insulin gtt, transition to sub-q when gap closed x2 - Repeat fluid bolus then NS @ 200cc/h.  Transition to D5 1/2NS once BG <250 - Potassium IV 10 meqx2 per protocol - Consult to care management for medication needs - BG q1, BMP q4 - A1c - Considered bicarb gtt with pH 7.094, but no evidence of decrease in morbidity/mortality - Monitor breathing. - CCM c/s in ED. No intervention for now.  Continue DKA protocol  # Buttock abscess: s/p Clinda, Rocephin, Flagyl and Vanc in ED. Draining on admission. WBC 18.0 - c/s to Pharmacy for Rocephin, flagyl and vanc placed in ED - Wound culture obtained in ED. - Narrow to PO abx when able - Blood cx also drawn after abx  # AKI. Cr 2.48 on admission (b/l 0.8-0.9). Likely from dehydration in the setting of DKA. - IVFs as above - Hold ACE-I - Avoid nephrotoxic agents - BMP as above  # Hypertensive urgency/ HTN: BP to 183/167 in ED. Unsure if true BP.  On my exam 167/102. - Hydralazine 5mg  prn SBP>180, DBP>110 - Resume ACE-I  once AKI resolved.  FEN/GI: IVF as above, Protonix Prophylaxis: Sub-q heparin  Disposition: Admit to SDU for glucose stabilization  History of Present Illness:  Rebecca Rhodes is a 35 y.o. female presenting with DKA  Patient reports that she initially came to ED to have the abscess on her buttock assessed. She notes that she developed SOB in ED waiting room.  She was found to be in DKA.  She notes that she has been having polydipsia x1 week and polyuria x4 days.  She takes Novolin 70/30 30u qd.  She was prescribed this BID but notes that she cannot afford her medications.  She does not check her glucose.  She denies nausea, vomiting, fevers, diarrhea, abdominal pain, vision changes, chest pain or headache.    She notes that the abscess appeared about 1 week ago.  It started draining while in ED waiting room.  Endorses pain.  She has a history of abscess in the past.  Allergic to PCN.  Resides with boyfriend. No tob, ETOH or drug use.  Not compliant with BP meds 2/2 financial constraints.    Review Of Systems: Per HPI with the following additions: none Otherwise the remainder of the systems were negative.  Patient Active Problem List   Diagnosis Date Noted  . Abscess of buttock, right 12/29/2015  . Diabetic ketoacidosis without coma associated with type 2 diabetes mellitus (HCC)   . DKA (diabetic ketoacidoses) (HCC) 01/17/2015  . AKI (acute kidney injury) (  HCC)   . SOB (shortness of breath)   . Dehydration   . 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 Boil, leg 06/24/2013  . 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 Benign essential hypertension antepartum 02/18/2013  . Acute respiratory failure (HCC) 10/24/2012  . HTN (hypertension) 06/04/2012  . Hyperlipidemia 06/04/2012  . Diabetes mellitus (HCC) 06/04/2012    Past Medical History: Past Medical History  Diagnosis Date  . HTN (hypertension) 06/04/2012  . Hyperlipidemia 06/04/2012  . Diabetes mellitus (HCC) 06/04/2012  . HSV-2  infection   . DKA (diabetic ketoacidoses) (HCC)   . Acute respiratory failure [facility])     Past Surgical History: Past Surgical History  Procedure Laterality Date  . Irrigation and debridement abscess  10/31/2012    Procedure: IRRIGATION AND DEBRIDEMENT ABSCESS;  Surgeon: Cherylynn Ridges, MD;  Location: [facility] OR;  Service: General;  Laterality: Bilateral;  . Cesarean section N/A 08/08/2013    Procedure: CESAREAN SECTION;  Surgeon: Tereso Newcomer, MD;  Location: [facility] ORS;  Service: Obstetrics;  Laterality: N/A;    Social History: Social History  Substance Use Topics  . Smoking status: Never Smoker   . Smokeless tobacco: Never Used  . Alcohol Use: No   Additional social history: none  Please also refer to relevant sections of EMR.  Family History: Family History  Problem Relation Age of Onset  . Hypertension Mother   . Stroke Mother   . Heart disease Father   . Diabetes Father   . Cancer Maternal Grandfather   . Hypertension Brother   . Diabetes Paternal Aunt   . Diabetes Paternal Uncle   . Diabetes Paternal Grandmother   . Diabetes Paternal Grandfather    Allergies and Medications: Allergies  Allergen Reactions  . Penicillins Rash   No current facility-administered medications on file prior to encounter.   Current Outpatient Prescriptions on File Prior to Encounter  Medication Sig Dispense Refill  . lisinopril (PRINIVIL,ZESTRIL) 40 MG tablet Take 1 tablet (40 mg total) by mouth daily. 30 tablet 0  . atorvastatin (LIPITOR) 40 MG tablet Take 1 tablet (40 mg total) by mouth daily at 6 PM. 30 tablet 11  . glucose blood (ACCU-CHEK SMARTVIEW) test strip Check sugar 3 times a day 100 each 12  . insulin aspart protamine- aspart (NOVOLOG MIX 70/30) (70-30) 100 UNIT/ML injection Inject 0.3 mLs (30 Units total) into the skin 2 (two) times daily with a meal. 10 mL 11    Objective: BP 167/102 mmHg  Pulse 115  Temp(Src) 97.9 F (36.6 C) (Oral)  Resp 18  Ht  (1.626 m)  Wt 215  lb 3.2 oz (97.614 kg)  BMI 36.92 kg/m2  SpO2 100%  LMP 12/23/2015 Exam: General: awake, alert, kussmaul breathing Eyes: sclera Vanderveen, PERRL, EOMI ENTM: MM tacky, o/p clear Neck: supple Cardiovascular: tachycardic, no murmurs Respiratory: CTAB, kussmaul breathing, speaks in full sentences Abdomen: obese, soft, NT/ND, +BS MSK: no edema, moves all extremities Skin: quarter sized abscess on inner R gluteal fold, no drainage appreciated, moderate induration spanning about 1 cm above lesion, +TTP, b/l feet dry and cracked but no ulceration   Neuro: follows all commands, no focal deficits Psych: mood stable, speech normal, good eye contact  Labs and Imaging: Results for orders placed or performed during the hospital encounter of 12/28/15 (from the past 24 hour(s))  CBG monitoring, ED     Status: Abnormal   Collection Time: 12/28/15  4:49 PM  Result Value Ref Range   Glucose-Capillary >600 (HH)  65 - 99 mg/dL  Basic metabolic panel     Status: Abnormal   Collection Time: 12/28/15  5:24 PM  Result Value Ref Range   Sodium 129 (L) 135 - 145 mmol/L   Potassium 4.2 3.5 - 5.1 mmol/L   Chloride 90 (L) 101 - 111 mmol/L   CO2 11 (L) 22 - 32 mmol/L   Glucose, Bld 765 (HH) 65 - 99 mg/dL   BUN 10 6 - 20 mg/dL   Creatinine, Ser 4.09 (H) 0.44 - 1.00 mg/dL   Calcium 8.9 8.9 - 81.1 mg/dL   GFR calc non Af Amer 27 (L) >60 mL/min   GFR calc Af Amer 31 (L) >60 mL/min   Anion gap 28 (H) 5 - 15  CBC     Status: Abnormal   Collection Time: 12/28/15  5:24 PM  Result Value Ref Range   WBC 18.0 (H) 4.0 - 10.5 K/uL   RBC 4.34 3.87 - 5.11 MIL/uL   Hemoglobin 12.5 12.0 - 15.0 g/dL   HCT 91.4 78.2 - 95.6 %   MCV 90.8 78.0 - 100.0 fL   MCH 28.8 26.0 - 34.0 pg   MCHC 31.7 30.0 - 36.0 g/dL   RDW 21.3 08.6 - 57.8 %   Platelets 311 150 - 400 K/uL  CBG monitoring, ED     Status: Abnormal   Collection Time: 12/28/15 10:23 PM  Result Value Ref Range   Glucose-Capillary >600 (HH) 65 - 99 mg/dL  Basic  metabolic panel     Status: Abnormal   Collection Time: 12/28/15 11:00 PM  Result Value Ref Range   Sodium 133 (L) 135 - 145 mmol/L   Potassium 4.5 3.5 - 5.1 mmol/L   Chloride 92 (L) 101 - 111 mmol/L   CO2 9 (L) 22 - 32 mmol/L   Glucose, Bld 748 (HH) 65 - 99 mg/dL   BUN 14 6 - 20 mg/dL   Creatinine, Ser 4.69 (H) 0.44 - 1.00 mg/dL   Calcium 9.1 8.9 - 62.9 mg/dL   GFR calc non Af Amer 24 (L) >60 mL/min   GFR calc Af Amer 28 (L) >60 mL/min   Anion gap 32 (H) 5 - 15  I-Stat Venous Blood Gas, ED  ([facility], [facility])     Status: Abnormal   Collection Time: 12/28/15 11:08 PM  Result Value Ref Range   pH, Ven 7.094 (LL) 7.250 - 7.300   pCO2, Ven 25.6 (L) 45.0 - 50.0 mmHg   pO2, Ven 23.0 (LL) 30.0 - 45.0 mmHg   Bicarbonate 7.8 (L) 20.0 - 24.0 mEq/L   TCO2 9 0 - 100 mmol/L   O2 Saturation 26.0 %   Acid-base deficit 20.0 (H) 0.0 - 2.0 mmol/L   Patient temperature HIDE    Sample type VENOUS    Comment NOTIFIED PHYSICIAN   CBG monitoring, ED     Status: Abnormal   Collection Time: 12/29/15 12:56 AM  Result Value Ref Range   Glucose-Capillary 583 (HH) 65 - 99 mg/dL   Comment 1 Document in Chart   POC Urine Pregnancy, ED  (not at [facility])     Status: None   Collection Time: 12/29/15  1:02 AM  Result Value Ref Range   Preg Test, Ur NEGATIVE NEGATIVE  Urinalysis, Routine w reflex microscopic (not at [facility])     Status: Abnormal   Collection Time: 12/29/15  1:07 AM  Result Value Ref Range   Color, Urine YELLOW YELLOW   APPearance CLOUDY (A) CLEAR  Specific Gravity, Urine 1.024 1.005 - 1.030   pH 5.0 5.0 - 8.0   Glucose, UA >1000 (A) NEGATIVE mg/dL   Hgb urine dipstick LARGE (A) NEGATIVE   Bilirubin Urine NEGATIVE NEGATIVE   Ketones, ur >80 (A) NEGATIVE mg/dL   Protein, ur NEGATIVE NEGATIVE mg/dL   Nitrite NEGATIVE NEGATIVE   Leukocytes, UA TRACE (A) NEGATIVE  Urine microscopic-add on     Status: Abnormal   Collection Time: 12/29/15  1:07 AM  Result Value Ref Range   Squamous Epithelial / LPF 6-30  (A) NONE SEEN   WBC, UA 0-5 0 - 5 WBC/hpf   RBC / HPF 6-30 0 - 5 RBC/hpf   Bacteria, UA FEW (A) NONE SEEN   Urine-Other YEAST PRESENT   CBG monitoring, ED     Status: Abnormal   Collection Time: 12/29/15  1:58 AM  Result Value Ref Range   Glucose-Capillary 517 (H) 65 - 99 mg/dL    No results found.  Raliegh Ip, DO 12/29/2015, 2:04 AM PGY-2, [facility] FPTS Intern pager: [phone], text pages welcome

## 2015-12-29 NOTE — Care Management Note (Addendum)
Case Management Note  Patient Details  Name: Rebecca Rhodes MRN: [id number] Date of Birth: January 10, 1981  Subjective/Objective:    Date: 12/29/15 Spoke with patient at the bedside.  Introduced self as Sports coach and explained role in discharge planning and how to be reached.  Verified patient lives in town, with her boyfriend and his mother.  PtA patient is indep. Expressed potential need for no other DME.  Verified patient anticipates to go home with boyfriend at time of discharge and will have  part-time supervision by friends at this time to best of their knowledge.   Patient confirmed needing help with her medication.  Patient states she gets Novolin 70/30 from [facility], will need to have generic meds from [facility] that she can afford.   NCM gave patient the re-lion information for glcucometer and strips and insulin.  Patient states she just got Medicaid.  She states she has two bags of syringes.    Patient  takes the bus to MD appointments.  Patient states she may need a bus pass at discharge.  CSW referral.  Verified patient has PCP with Family Practice and will be following up with them at discharge.   Plan: CM will continue to follow for discharge planning and [facility] resources.    Addendum 1/18- Financial counselor spoke with patient, she does have a child in the home so she should be able to apply for medicaid, financial counselor will contact patient's Case Worker to get them started on applying for medicaid.  In the meantime patient will need to continue to purchase Novolin 70/30 insulin from [facility] which is the cheapest for her right now.              Action/Plan:  Continue insulin drip until gap closed x 2. Will start Lantus before stopping drip   Expected Discharge Date:                  Expected Discharge Plan:  Home/Self Care  In-House Referral:     Discharge planning Services  CM Consult  Post Acute Care Choice:    Choice offered to:     DME Arranged:    DME Agency:      HH Arranged:    HH Agency:     Status of Service:  In process, will continue to follow  Medicare Important Message Given:    Date Medicare IM Given:    Medicare IM give by:    Date Additional Medicare IM Given:    Additional Medicare Important Message give by:     If discussed at Long Length of Stay Meetings, dates discussed:    Additional Comments:  [name], RN 12/29/2015, 3:56 PM

## 2015-12-29 NOTE — ED Notes (Signed)
Family practice doctor notified of lab results --

## 2015-12-29 NOTE — Progress Notes (Signed)
ANTIBIOTIC CONSULT NOTE - INITIAL  Pharmacy Consult for Vancocin, Rocephin, and Flagyl Indication: gluteal fold abscess  Allergies  Allergen Reactions  . Penicillins Rash    Patient Measurements: Height:  (162.6 cm) Weight: 215 lb 3.2 oz (97.614 kg) IBW/kg (Calculated) : 54.7  Vital Signs: Temp: 97.9 F (36.6 C) (01/16 2224) Temp Source: Oral (01/16 2224) BP: 167/102 mmHg (01/17 0000) Pulse Rate: 115 (01/17 0000)  Labs:  Recent Labs  12/28/15 1724 12/28/15 2300  WBC 18.0*  --   HGB 12.5  --   PLT 311  --   CREATININE 2.27* 2.48*   Estimated Creatinine Clearance: 35.6 mL/min (by C-G formula based on Cr of 2.48).   Medical History: Past Medical History  Diagnosis Date  . HTN (hypertension) 06/04/2012  . Hyperlipidemia 06/04/2012  . Diabetes mellitus (HCC) 06/04/2012  . HSV-2 infection   . DKA (diabetic ketoacidoses) (HCC)   . Acute respiratory failure (HCC)      Assessment: 35yo female w/ recurrent boils c/o right buttock abscess x1wk w/ progressive pain and drainage, to begin IV ABX.  Goal of Therapy:  Vancomycin trough level 10-15 mcg/ml  Plan:  Will begin vancomycin  IV Q24H, Rocephin 1g IV Q24H, and Flagyl  IV Q8H and monitor CBC, Cx, levels prn.  Vernard Gambles, PharmD, BCPS  12/29/2015,1:37 AM

## 2015-12-29 NOTE — Consult Note (Signed)
Name: Rebecca Rhodes MRN: [id number] DOB: 10/27/1981  PULMONARY / CRITICAL CARE MEDICINE  HPI: 35 year old female with PMH of poorly controlled DM, DKA, HTN, and recurrent boils who presented to [facility] ED with complaint of right buttock abscess. She states the abscess began about a week or so ago and has progressively become more painful. She reports purulent drainage from the site. She mentions similar episodes in the past, most recently requiring surgical drainage of an abscess on her left buttocks two years ago. She has a history of poorly controlled diabetes and admits that she is not taking her insulin as prescribed, among other medications, due to inability to afford them. She says she is supposed to take 50 units twice daily, but sometimes only takes once a day. She reports 2 previous hospitalizations for DKA. She reports associated shortness of breath and heartburn. She denies any fever, chills, headache, chest pain, abdominal pain.   Past Medical History  Diagnosis Date  . HTN (hypertension) 06/04/2012  . Hyperlipidemia 06/04/2012  . Diabetes mellitus (HCC) 06/04/2012  . HSV-2 infection   . DKA (diabetic ketoacidoses) (HCC)   . Acute respiratory failure [facility])    Past Surgical History  Procedure Laterality Date  . Irrigation and debridement abscess  10/31/2012    Procedure: IRRIGATION AND DEBRIDEMENT ABSCESS;  Surgeon: Cherylynn Ridges, MD;  Location: [facility] OR;  Service: General;  Laterality: Bilateral;  . Cesarean section N/A 08/08/2013    Procedure: CESAREAN SECTION;  Surgeon: Tereso Newcomer, MD;  Location: [facility] ORS;  Service: Obstetrics;  Laterality: N/A;   Prior to Admission medications   Medication Sig Start Date End Date Taking? Authorizing Provider  insulin NPH-regular Human (NOVOLIN 70/30) (70-30) 100 UNIT/ML injection Inject 50 Units into the skin 2 (two) times daily with a meal.   Yes Historical Provider, MD  lisinopril (PRINIVIL,ZESTRIL) 40 MG tablet Take 1 tablet (40 mg total) by  mouth daily. 10/23/14  Yes [place] N Rumley, DO  atorvastatin (LIPITOR) 40 MG tablet Take 1 tablet (40 mg total) by mouth daily at 6 PM. 08/31/14   Abram Sander, MD  glucose blood (ACCU-CHEK SMARTVIEW) test strip Check sugar 3 times a day 12/16/14   Nani Ravens, MD  insulin aspart protamine- aspart (NOVOLOG MIX 70/30) (70-30) 100 UNIT/ML injection Inject 0.3 mLs (30 Units total) into the skin 2 (two) times daily with a meal. 10/13/15   Nani Ravens, MD   Allergies Allergies  Allergen Reactions  . Penicillins Rash    Family History Family History  Problem Relation Age of Onset  . Hypertension Mother   . Stroke Mother   . Heart disease Father   . Diabetes Father   . Cancer Maternal Grandfather   . Hypertension Brother   . Diabetes Paternal Aunt   . Diabetes Paternal Uncle   . Diabetes Paternal Grandmother   . Diabetes Paternal Grandfather    Social History  reports that she has never smoked. She has never used smokeless tobacco. She reports that she does not drink alcohol or use illicit drugs.  Review of Systems  Constitutional: Negative for fever, chills and diaphoresis.  Respiratory: Positive for shortness of breath. Negative for cough, sputum production and wheezing.   Cardiovascular: Negative for chest pain, palpitations and leg swelling.  Gastrointestinal: Positive for heartburn. Negative for nausea, vomiting, abdominal pain, diarrhea and constipation.  Genitourinary: Negative for dysuria.  Musculoskeletal: Negative for myalgias and joint pain.  Skin:  Draining abscess right buttocks  Neurological: Negative for dizziness, tingling and headaches.     Vital Signs: Temp:  [97.9 F (36.6 C)-98.6 F (37 C)] 97.9 F (36.6 C) (01/16 2224) Pulse Rate:  [111-124] 115 (01/17 0000) Resp:  [18] 18 (01/16 1631) BP: (147-183)/(82-167) 167/102 mmHg (01/17 0000) SpO2:  [98 %-100 %] 100 % (01/17 0000) Weight:  [215 lb 3.2 oz (97.614 kg)] 215 lb 3.2 oz (97.614 kg) (01/16  1631)  Physical Examination: General:  Obese, AAF in mild distress.  Neuro: AAO x3 HEENT:  [place]/AT, EOMI Cardiovascular:  Tachycardic, no m/r/g  Lungs:  CTA, tachypneic, no wheezing, crackles, or rhonchi Abdomen: soft, non-tender, non-distended, +BS Musculoskeletal: moves all extremities, no pedal edema Skin:  Indurated, warm abscess located in right gluteal fold, tender to touch minimal purulent drainage. No fluctuance or crepitus.   Active Problems:   Diabetes mellitus (HCC)   Abscess of buttock, right   ASSESSMENT AND PLAN  PULMONARY A: Metabolic acidosis with respiratory compensation in setting of likely DKA P:   Admit to Stepdown unit, no role for ICU admission at this time  INFECTIOUS A: Abscess of right buttock Leukocytosis P:   Consider surgical consult for I&D F/u wound cultures Start empiric abx Vanc, Zosyn, Flagyl  ENDOCRINE A: Hyperglycemia with Metabolic Acidosis, likely DKA/HHS DM P:   Check UA, monitor electrolytes Continue to monitor CBGs, fluid repletion, insulin drip, and potassium replacement as per protocol   Darreld Mclean, M.D. Internal Medicine PGY1 Pager: [phone]  12/29/2015, 1:17 AM

## 2015-12-30 LAB — BASIC METABOLIC PANEL
ANION GAP: 17 — AB (ref 5–15)
ANION GAP: 14 (ref 5–15)
ANION GAP: 12 (ref 5–15)
BUN: 8 mg/dL (ref 6–20)
BUN: 7 mg/dL (ref 6–20)
BUN: 6 mg/dL (ref 6–20)
CALCIUM: 8.2 mg/dL — AB (ref 8.9–10.3)
CO2: 10 mmol/L — AB (ref 22–32)
CO2: 10 mmol/L — ABNORMAL LOW (ref 22–32)
CO2: 17 mmol/L — ABNORMAL LOW (ref 22–32)
CREATININE: 1.15 mg/dL — AB (ref 0.44–1.00)
Calcium: 8.2 mg/dL — ABNORMAL LOW (ref 8.9–10.3)
Calcium: 8.2 mg/dL — ABNORMAL LOW (ref 8.9–10.3)
Chloride: 109 mmol/L (ref 101–111)
Chloride: 111 mmol/L (ref 101–111)
Chloride: 106 mmol/L (ref 101–111)
Creatinine, Ser: 1.42 mg/dL — ABNORMAL HIGH (ref 0.44–1.00)
Creatinine, Ser: 1.5 mg/dL — ABNORMAL HIGH (ref 0.44–1.00)
GFR calc Af Amer: 54 mL/min — ABNORMAL LOW (ref >60)
GFR calc Af Amer: 51 mL/min — ABNORMAL LOW (ref >60)
GFR calc Af Amer: >60 mL/min (ref >60)
GFR, EST NON AFRICAN AMERICAN: 47 mL/min — AB (ref >60)
GFR, EST NON AFRICAN AMERICAN: 44 mL/min — AB (ref >60)
GLUCOSE: 359 mg/dL — AB (ref 65–99)
GLUCOSE: 395 mg/dL — AB (ref 65–99)
GLUCOSE: 270 mg/dL — AB (ref 65–99)
POTASSIUM: 3.6 mmol/L (ref 3.5–5.1)
POTASSIUM: 3.4 mmol/L — AB (ref 3.5–5.1)
Potassium: 3.6 mmol/L (ref 3.5–5.1)
Sodium: 136 mmol/L (ref 135–145)
Sodium: 135 mmol/L (ref 135–145)
Sodium: 135 mmol/L (ref 135–145)

## 2015-12-30 LAB — GLUCOSE, CAPILLARY
GLUCOSE-CAPILLARY: 260 mg/dL — AB (ref 65–99)
Glucose-Capillary: 307 mg/dL — ABNORMAL HIGH (ref 65–99)
Glucose-Capillary: 346 mg/dL — ABNORMAL HIGH (ref 65–99)
Glucose-Capillary: 233 mg/dL — ABNORMAL HIGH (ref 65–99)
Glucose-Capillary: 249 mg/dL — ABNORMAL HIGH (ref 65–99)

## 2015-12-30 LAB — HEMOGLOBIN A1C
Hgb A1c MFr Bld: 16.2 % — ABNORMAL HIGH (ref 4.8–5.6)
Mean Plasma Glucose: 418 mg/dL

## 2015-12-30 MED ORDER — IBUPROFEN 200 MG PO TABS
ORAL_TABLET | ORAL | Status: AC
  Filled 2015-12-30: qty 3

## 2015-12-30 MED ORDER — PANTOPRAZOLE SODIUM 40 MG PO TBEC
40.0000 mg | DELAYED_RELEASE_TABLET | Freq: Every day | ORAL | Status: DC
  Administered 2015-12-31 – 2016-01-05 (×6): 40 mg via ORAL
  Filled 2015-12-30 (×6): qty 1

## 2015-12-30 MED ORDER — IBUPROFEN 200 MG PO TABS
600.0000 mg | ORAL_TABLET | Freq: Once | ORAL | Status: AC
  Administered 2015-12-30: 600 mg via ORAL

## 2015-12-30 NOTE — Progress Notes (Signed)
Family Medicine Teaching Service Daily Progress Note Intern Pager: [phone]  Patient name: Rebecca Rhodes Medical record number: [id number] Date of birth: June 20, 1981 Age: 35 y.o. Gender: female  Primary Care Provider: Tawni Carnes, MD Consultants: none Code Status: full  Pt Overview and Major Events to Date:  01/17- admitted with DKA  Assessment and Plan: Rebecca Rhodes is a 35 y.o. female presenting with DKA . PMH is significant for T2DM, HTN, HLD   # DKA: CBG 400, bicarb 10, Ag 17. S/p insulin drip and IV fluid per protocol. Transitioned to SubQ yesterday. She is back in DKA this morning likely from not getting enough insulin. CBG in high 300' and low 400's overnight. - Cardiac monitoring - Continue resistant SSI - Increased Lntus to 45 units - Consult to care management for medication needs - Continue NS at 125 ml/hr - Will discharge on Novolin 70/30  #DM-2: A1c 16.2. Poorly controlled. Seems she is on Nololog 70/30 twice a day at home -Continue mgt as above -Will adjust home dose on discharge  # Buttock abscess: draining dark pus, s/p clinda, Rocephin, Flagyl and Vanc in ED. Draining on admission. WBC 18.0 - continue Cipro and Clinda (1/17>) - Wound culture pending - Blood cx also drawn after antibiotics-pending  # AKI. Cr 1.42, 2.48 on admission (b/l 0.8-0.9). Likely from dehydration in the setting of DKA. - IVFs as above - Hold ACE-I - Avoid nephrotoxic agents - BMP as above  # Hypertensive urgency/ HTN: normotensive this morning. 183/167 in ED. - Hydralazine  prn SBP>180, DBP>110 - Resume ACE-I once AKI resolved.  FEN/GI:  IVF as above, Protonix  Prophylaxis: Sub-q heparin  Disposition: step down improvement in DKA  Subjective:  Reports pain from her abscess that has improved with pain meds. Denies abdominal pain, shortness of breath, chest pain.   Objective: Temp:  [98.1 F (36.7 C)-99.7 F (37.6 C)] 98.1 F (36.7 C) (01/18 0359) Pulse Rate:  [108-123]  108 (01/18 0400) Resp:  [17-31] 22 (01/18 0400) BP: (109-141)/(58-96) 125/58 mmHg (01/18 0400) SpO2:  [97 %-100 %] 99 % (01/18 0400) Weight:  [215 lb 9.8 oz (97.8 kg)] 215 lb 9.8 oz (97.8 kg) (01/17 0944) Physical Exam: General: awake, alert Eyes: sclera [place], PERRL, EOMI ENTM: MM tacky, o/p clear Cardiovascular: tachycardic, no murmurs Respiratory: normal breathing, CTAB,  speaks in full sentences Abdomen: obese, soft, NT/ND, +BS MSK: no edema, moves all extremities Skin: two quarter sized abscesses on inner R gluteal fold. One draining dark purulent discharge. Appears the absceses are confluent  Laboratory:  Recent Labs Lab 12/28/15 1724  WBC 18.0*  HGB 12.5  HCT 39.4  PLT 311    Recent Labs Lab 12/29/15 1055 12/29/15 1543 12/30/15 0535  NA 142 138 136  K 3.5 3.3* 3.6  CL 110 110 109  CO2 20* 19* 10*  BUN CREATININE 1.13* 1.09* 1.42*  CALCIUM 8.6* 8.5* 8.2*  GLUCOSE 201* 238* 359*    Imaging/Diagnostic Tests: No results found.  Almon Hercules, MD 12/30/2015, 7:27 AM PGY-1, [facility] FPTS Intern pager: [phone], text pages welcome

## 2015-12-30 NOTE — Progress Notes (Signed)
Inpatient Diabetes Program Recommendations  AACE/ADA: New Consensus Statement on Inpatient Glycemic Control (2015)  Target Ranges:  Prepandial:   less than 140 mg/dL      Peak postprandial:   less than 180 mg/dL (1-2 hours)      Critically ill patients:  140 - 180 mg/dL   Review of Glycemic Control:  Results for Rebecca Rhodes, Rebecca Rhodes (MRN [id number]) as of 12/30/2015 11:01  Ref. Range 12/29/2015 21:37 12/29/2015 23:32 12/30/2015 07:55  Glucose-Capillary Latest Ref Range: 65-99 mg/dL 409 (H) 811 (H) 914 (H)  Results for Rebecca Rhodes, Rebecca Rhodes (MRN [id number]) as of 12/30/2015 11:01  Ref. Range 12/29/2015 10:55  Hemoglobin A1C Latest Ref Range: 4.8-5.6 % 16.2 (H)    Diabetes history: Type 2 diabetes Outpatient Diabetes medications: Novolin 70/30 50 units bid-  Patient states she is only taking once daily most of the time Current orders for Inpatient glycemic control:  Lantus 45 units daily, Novolog resistant tid with meals and HS  Inpatient Diabetes Program Recommendations:    Spoke with patient regarding home diabetes management.  Note that A1C indicates blood sugar averages greater than 300 mg/dL.  She admits that she is not checking her blood sugars and she is taking her insulin sporadically.  She has recently obtained Medicaid however it is only for family planning and therefore does not cover medications/strips.  She however is working with someone to obtain full Medicaid which would help with the cost of medications, strips, and MD appointments.  Briefly described insulin need and profile including the importance of taking consistently and with food.  She admits that she has had low blood sugars in the past with the lowest being in the 30's.  Discussed treatment of low blood sugars and the importance of having snacks and glucose source with her at all times.  She also admits that "low blood sugars" scare her.  She was able to teach back the signs of low blood sugar and we discussed treatment.  Patient states that  she sometimes feels overwhelmed with diabetes management and caring for her 36 year old child.  Discussed importance of caring for herself so that she can care for her daughter.  She needs support to manage this disease appropriately.  Based on A1C history, she has not had an A1C less than 14 since pregnancy.  She does not seem to grasp the importance of diabetes management, and does not have the resources to care for her diabetes appropriately.  She needs close follow-up and support in order to care for herself.    She states that she is being changed to Lantus/Novolog regimen.  However note that patient does not have Rx. Coverage at this time, therefore she will likely not be able to afford this.  Further this will require more shots per day with close monitoring.  May consider continuing 70/30 regimen after hospitalization due to cost until insurance is obtained?? Also she would benefit from outpatient diabetes education upon discharge.    Will follow.  Thanks, Beryl Meager, RN, BC-ADM Inpatient Diabetes Coordinator Pager [phone] (8a-5p)

## 2015-12-30 NOTE — Progress Notes (Signed)
Patient trasfered from 3S to [phone] via bed alert and oriented x 4; no complaints of pain; IV in LFA running fluids@125cc /hr. Orient patient to room and unit; tele box 23 was placed; instructed how to use the call bell and  fall risk precautions. Will continue to monitor the patient.

## 2015-12-31 ENCOUNTER — Inpatient Hospital Stay (HOSPITAL_COMMUNITY): Payer: Medicaid Other

## 2015-12-31 DIAGNOSIS — E1110 Type 2 diabetes mellitus with ketoacidosis without coma: Secondary | ICD-10-CM | POA: Insufficient documentation

## 2015-12-31 DIAGNOSIS — L0291 Cutaneous abscess, unspecified: Secondary | ICD-10-CM

## 2015-12-31 LAB — GLUCOSE, CAPILLARY
GLUCOSE-CAPILLARY: 231 mg/dL — AB (ref 65–99)
GLUCOSE-CAPILLARY: 261 mg/dL — AB (ref 65–99)
Glucose-Capillary: 253 mg/dL — ABNORMAL HIGH (ref 65–99)
Glucose-Capillary: 266 mg/dL — ABNORMAL HIGH (ref 65–99)

## 2015-12-31 LAB — WOUND CULTURE

## 2015-12-31 LAB — SURGICAL PCR SCREEN
MRSA, PCR: NEGATIVE
Staphylococcus aureus: NEGATIVE

## 2015-12-31 LAB — BASIC METABOLIC PANEL
Anion gap: 14 (ref 5–15)
BUN: <5 mg/dL — ABNORMAL LOW (ref 6–20)
CALCIUM: 7.8 mg/dL — AB (ref 8.9–10.3)
CO2: 13 mmol/L — AB (ref 22–32)
CREATININE: 1.07 mg/dL — AB (ref 0.44–1.00)
Chloride: 107 mmol/L (ref 101–111)
Glucose, Bld: 277 mg/dL — ABNORMAL HIGH (ref 65–99)
Potassium: 3.3 mmol/L — ABNORMAL LOW (ref 3.5–5.1)
SODIUM: 134 mmol/L — AB (ref 135–145)

## 2015-12-31 MED ORDER — POTASSIUM CHLORIDE CRYS ER 20 MEQ PO TBCR
40.0000 meq | EXTENDED_RELEASE_TABLET | Freq: Two times a day (BID) | ORAL | Status: DC
  Administered 2015-12-31 – 2016-01-01 (×4): 40 meq via ORAL
  Filled 2015-12-31 (×4): qty 2

## 2015-12-31 MED ORDER — HYDROCODONE-ACETAMINOPHEN 7.5-325 MG PO TABS
1.0000 | ORAL_TABLET | Freq: Four times a day (QID) | ORAL | Status: DC | PRN
  Administered 2015-12-31 – 2016-01-01 (×2): 1 via ORAL
  Filled 2015-12-31 (×2): qty 1

## 2015-12-31 MED ORDER — INSULIN ASPART 100 UNIT/ML ~~LOC~~ SOLN
10.0000 [IU] | Freq: Three times a day (TID) | SUBCUTANEOUS | Status: DC
  Administered 2015-12-31 – 2016-01-03 (×8): 10 [IU] via SUBCUTANEOUS

## 2015-12-31 MED ORDER — LEVOFLOXACIN 500 MG PO TABS
500.0000 mg | ORAL_TABLET | Freq: Every day | ORAL | Status: DC
  Administered 2015-12-31: 500 mg via ORAL
  Filled 2015-12-31: qty 1

## 2015-12-31 NOTE — Progress Notes (Signed)
Family Medicine Teaching Service Daily Progress Note Intern Pager: [phone]  Patient name: Rebecca Rhodes Medical record number: [id number] Date of birth: [birth year] Age: 35 y.o. Gender: female  Primary Care Provider: Tawni Carnes, MD Consultants: none Code Status: full  Pt Overview and Major Events to Date:  01/17- admitted with DKA  Assessment and Plan: Rebecca Rhodes is a 35 y.o. female presenting with DKA . PMH is significant for T2DM, HTN, HLD   DKA, resolved: Likely contributors are abscess and not taking her Novolin as prescribed. CBGs in the mid-200s overnight, 253 this am.  - Cardiac monitoring - Continue resistant SSI - Increased Lantus to 45 units yesterday. Will add Novolog 10 units tid with meals. - Will discharge on Novolin 70/30 - Consult to care management for medication needs. Pt is applying for Medicaid and will continue Novolin 70/30 until she gets Medicaid.  DM-2, poorly controlled: A1c 16.2%. Prescribed Novolin 70/30 50 units bid, but has been taking it daily because she can't afford it. - Continue management as above - Will adjust home dose on discharge  Hypokalemia: K 3.3 this am. - Will give K-Dur bid.  Buttock abscess: 2nd area now draining dark pus, s/p clinda, Rocephin, Flagyl and Vanc in ED. Draining on admission. WBC 18.0. - Has been on Cipro and Clinda (1/17>). Will d/c Cipro and add Levaquin for coverage of GBS. - Wound culture growing moderate Group B strep - Blood cultures showing no growth x 1 day - Will get ultrasound of abscess - Surgery consult  AKI, improving. Cr 2.48 on admission (b/l 0.8-0.9) > 1.07 this morning. Likely from dehydration in the setting of DKA. - IVFs as above - Hold ACE-I - Avoid nephrotoxic agents - Daily BMPs  HTN, improving. 183/167 in ED > 156/88 this morning - Hydralazine  prn SBP>180, DBP>110 - Resume ACE-I once AKI resolved. - Follow-up as an outpatient.  FEN/GI: SLIV today, as she has had good PO  intake; Protonix  Prophylaxis: Sub-q heparin  Disposition: Anticipate discharge home later today or tomorrow, depending on her blood sugars.  Subjective:  Pt states she is doing well today. She feels like another abscess on her buttocks popped, so her pain is much improved today. She has no concerns.  Objective: Temp:  [97.9 F (36.6 C)-99.9 F (37.7 C)] 99.5 F (37.5 C) (01/19 0507) Pulse Rate:  [90-116] 111 (01/19 0507) Resp:  [16-27] 18 (01/19 0507) BP: (112-156)/(72-107) 156/88 mmHg (01/19 0507) SpO2:  [97 %-100 %] 100 % (01/19 0507) Physical Exam: General: awake, alert Eyes: sclera [place], PERRL, EOMI ENTM: MM tacky, o/p clear Cardiovascular: Tachycardic, regular rhythm, no murmurs Respiratory: normal breathing, CTAB,  speaks in full sentences Abdomen: obese, soft, NT/ND, +BS MSK: no edema, moves all extremities Skin: two quarter sized abscesses on inner R gluteal fold. One draining dark purulent discharge. Appears the absceses are confluent  Laboratory:  Recent Labs Lab 12/28/15 1724  WBC 18.0*  HGB 12.5  HCT 39.4  PLT 311    Recent Labs Lab 12/30/15 1006 12/30/15 1645 12/31/15 0523  NA 135 135 134*  K 3.4* 3.6 3.3*  CL 111 106 107  CO2 10* 17* 13*  BUN 7 6 <5*  CREATININE 1.50* 1.15* 1.07*  CALCIUM 8.2* 8.2* 7.8*  GLUCOSE 395* 270* 277*   Hgb A1c: 16.2%  Imaging/Diagnostic Tests: No results found.  Campbell Stall, MD 12/31/2015, 7:29 AM PGY-1, [place] [facility] FPTS Intern pager: [phone], text pages welcome

## 2015-12-31 NOTE — Consult Note (Signed)
Reason for Consult:bilateral gluteal cleft abscesses; right greater than left. Referring Physician: Dr. Hyman Bible  Rebecca Rhodes is an 35 y.o. female.  HPI: Pt presented to Ed on 12/29/15 with rectal abscess, she says she has had this before.  Work up in the ED shows she had a glucose 748, she became SOB in the ED, and was found to be in DKA.  Pt had a quarter size abscess on admit on the right side and it was draining at the time, cultures were obtained. She has a hx of MRSA per pt. Report..  She was admitted and her DKA was treated.  Culture grew: MODERATE GROUP B STREP(S.AGALACTIAE)ISOLATED.  She is on day 4 of clindamycin and day 3 of Cipro.  We are called because even though the right side is draining it is getting larger and there is now one found on the left side.  The right side is draining purulent fluid at this time.  It is 7-8 cm in diameter now.  The one on the left that is new is 3 cm and not draining.    Past Medical History  Diagnosis Date  . HTN (hypertension) 06/04/2012  . Hyperlipidemia 06/04/2012  . Diabetes mellitus ([place]) 06/04/2012  . HSV-2 infection   . DKA (diabetic ketoacidoses) ([place])   . Acute respiratory failure [facility])     Past Surgical History  Procedure Laterality Date  . Irrigation and debridement abscess  10/31/2012    Procedure: IRRIGATION AND DEBRIDEMENT ABSCESS;  Surgeon: Gwenyth Ober, MD;  Location: [place];  Service: General;  Laterality: Bilateral;  . Cesarean section N/A 08/08/2013    Procedure: CESAREAN SECTION;  Surgeon: Osborne Oman, MD;  Location: [facility];  Service: Obstetrics;  Laterality: N/A;    Family History  Problem Relation Age of Onset  . Hypertension Mother   . Stroke Mother   . Heart disease Father   . Diabetes Father   . Cancer Maternal Grandfather   . Hypertension Brother   . Diabetes Paternal Aunt   . Diabetes Paternal Uncle   . Diabetes Paternal Grandmother   . Diabetes Paternal Grandfather     Social History:  reports that she  has never smoked. She has never used smokeless tobacco. She reports that she does not drink alcohol or use illicit drugs.  Allergies:  Allergies  Allergen Reactions  . Penicillins Rash   Prior to Admission medications   Medication Sig Start Date End Date Taking? Authorizing Provider  insulin NPH-regular Human (NOVOLIN 70/30) (70-30) 100 UNIT/ML injection Inject 50 Units into the skin 2 (two) times daily with a meal.   Yes Historical Provider, MD  lisinopril (PRINIVIL,ZESTRIL) 40 MG tablet Take 1 tablet (40 mg total) by mouth daily. 10/23/14  Yes Prescott N Rumley, DO  atorvastatin (LIPITOR) 40 MG tablet Take 1 tablet (40 mg total) by mouth daily at 6 PM. 08/31/14   Frazier Richards, MD  glucose blood (ACCU-CHEK SMARTVIEW) test strip Check sugar 3 times a day 12/16/14   Leone Brand, MD  insulin aspart protamine- aspart (NOVOLOG MIX 70/30) (70-30) 100 UNIT/ML injection Inject 0.3 mLs (30 Units total) into the skin 2 (two) times daily with a meal. 10/13/15   Leone Brand, MD    Medications:  Scheduled: . clindamycin  300 mg Oral 3 times per day  . heparin  5,000 Units Subcutaneous 3 times per day  . insulin aspart  0-20 Units Subcutaneous TID WC  . insulin aspart  0-5  Units Subcutaneous QHS  . insulin aspart  10 Units Subcutaneous TID WC  . insulin glargine  45 Units Subcutaneous Daily  . levofloxacin  500 mg Oral Daily  . pantoprazole  40 mg Oral Daily  . potassium chloride  40 mEq Oral BID   Continuous: . sodium chloride 1,000 mL (12/30/15 1759)  . sodium chloride 200 mL/hr at 12/31/15 0615   Anti-infectives    Start     Dose/Rate Route Frequency Ordered Stop   12/31/15 1032  levofloxacin (LEVAQUIN) tablet 500 mg     500 mg Oral Daily 12/31/15 1020     12/30/15 0000   1,000 mg 200 mL/hr over 60 Minutes Intravenous Every 24 hours 12/29/15 0137 12/29/15 1033   12/29/15 1400  clindamycin (CLEOCIN) capsule 300 mg     300 mg Oral 3 times per day 12/29/15 1031     12/29/15 1045   1  tablet Oral Every 12 hours 12/29/15 1031 12/29/15 1032   12/29/15 1045  ciprofloxacin (CIPRO) tablet 500 mg  Status:  Discontinued     500 mg Oral 2 times daily 12/29/15 1032 12/31/15 1019   12/29/15 0200  metroNIDAZOLE (FLAGYL) IVPB 500 mg  Status:  Discontinued     500 mg 100 mL/hr over 60 Minutes Intravenous Every 8 hours 12/29/15 0137 12/29/15 1033   12/29/15 0145  vancomycin (VANCOCIN) IVPB 1000 mg/200 mL premix  Status:  Discontinued     1,000 mg 200 mL/hr over 60 Minutes Intravenous  Once 12/29/15 0137 12/29/15 1033   12/29/15 0145  cefTRIAXone (ROCEPHIN) 1 g in dextrose 5 % 50 mL IVPB  Status:  Discontinued     1 g 100 mL/hr over 30 Minutes Intravenous Daily at bedtime 12/29/15 0137 12/29/15 1033   12/28/15 2330  clindamycin (CLEOCIN) IVPB 600 mg     600 mg 100 mL/hr over 30 Minutes Intravenous  Once 12/28/15 2319 12/28/15 2341      Results for orders placed or performed during the hospital encounter of 12/28/15 (from the past 48 hour(s))  Glucose, capillary     Status: Abnormal   Collection Time: 12/29/15  1:13 PM  Result Value Ref Range   Glucose-Capillary 222 (H) 65 - 99 mg/dL  Glucose, capillary     Status: Abnormal   Collection Time: 12/29/15  2:16 PM  Result Value Ref Range   Glucose-Capillary 224 (H) 65 - 99 mg/dL   Comment 1 Notify RN    Comment 2 Document in Chart   Glucose, capillary     Status: Abnormal   Collection Time: 12/29/15  3:19 PM  Result Value Ref Range   Glucose-Capillary 229 (H) 65 - 99 mg/dL   Comment 1 Notify RN    Comment 2 Document in Chart   Basic metabolic panel     Status: Abnormal   Collection Time: 12/29/15  3:43 PM  Result Value Ref Range   Sodium 138 135 - 145 mmol/L   Potassium 3.3 (L) 3.5 - 5.1 mmol/L   Chloride 110 101 - 111 mmol/L   CO2 19 (L) 22 - 32 mmol/L   Glucose, Bld 238 (H) 65 - 99 mg/dL   BUN 9 6 - 20 mg/dL   Creatinine, Ser 1.09 (H) 0.44 - 1.00 mg/dL   Calcium 8.5 (L) 8.9 - 10.3 mg/dL   GFR calc non Af Amer >60 >60  mL/min   GFR calc Af Amer >60 >60 mL/min    Comment: (NOTE) The eGFR has been calculated using the  CKD EPI equation. This calculation has not been validated in all clinical situations. eGFR's persistently <60 mL/min signify possible Chronic Kidney Disease.    Anion gap 9 5 - 15  Glucose, capillary     Status: Abnormal   Collection Time: 12/29/15  4:19 PM  Result Value Ref Range   Glucose-Capillary 206 (H) 65 - 99 mg/dL  Glucose, capillary     Status: Abnormal   Collection Time: 12/29/15  5:22 PM  Result Value Ref Range   Glucose-Capillary 249 (H) 65 - 99 mg/dL  Glucose, capillary     Status: Abnormal   Collection Time: 12/29/15  9:37 PM  Result Value Ref Range   Glucose-Capillary 413 (H) 65 - 99 mg/dL  Glucose, capillary     Status: Abnormal   Collection Time: 12/29/15 11:32 PM  Result Value Ref Range   Glucose-Capillary 383 (H) 65 - 99 mg/dL  Basic metabolic panel     Status: Abnormal   Collection Time: 12/30/15  5:35 AM  Result Value Ref Range   Sodium 136 135 - 145 mmol/L   Potassium 3.6 3.5 - 5.1 mmol/L   Chloride 109 101 - 111 mmol/L   CO2 10 (L) 22 - 32 mmol/L   Glucose, Bld 359 (H) 65 - 99 mg/dL   BUN 8 6 - 20 mg/dL   Creatinine, Ser 1.42 (H) 0.44 - 1.00 mg/dL   Calcium 8.2 (L) 8.9 - 10.3 mg/dL   GFR calc non Af Amer 47 (L) >60 mL/min   GFR calc Af Amer 54 (L) >60 mL/min    Comment: (NOTE) The eGFR has been calculated using the CKD EPI equation. This calculation has not been validated in all clinical situations. eGFR's persistently <60 mL/min signify possible Chronic Kidney Disease.    Anion gap 17 (H) 5 - 15  Glucose, capillary     Status: Abnormal   Collection Time: 12/30/15  7:55 AM  Result Value Ref Range   Glucose-Capillary 307 (H) 65 - 99 mg/dL  Basic metabolic panel     Status: Abnormal   Collection Time: 12/30/15 10:06 AM  Result Value Ref Range   Sodium 135 135 - 145 mmol/L   Potassium 3.4 (L) 3.5 - 5.1 mmol/L   Chloride 111 101 - 111 mmol/L    CO2 10 (L) 22 - 32 mmol/L   Glucose, Bld 395 (H) 65 - 99 mg/dL   BUN 7 6 - 20 mg/dL   Creatinine, Ser 1.50 (H) 0.44 - 1.00 mg/dL   Calcium 8.2 (L) 8.9 - 10.3 mg/dL   GFR calc non Af Amer 44 (L) >60 mL/min   GFR calc Af Amer 51 (L) >60 mL/min    Comment: (NOTE) The eGFR has been calculated using the CKD EPI equation. This calculation has not been validated in all clinical situations. eGFR's persistently <60 mL/min signify possible Chronic Kidney Disease.    Anion gap 14 5 - 15  Glucose, capillary     Status: Abnormal   Collection Time: 12/30/15 11:45 AM  Result Value Ref Range   Glucose-Capillary 346 (H) 65 - 99 mg/dL   Comment 1 Notify RN    Comment 2 Document in Chart   Glucose, capillary     Status: Abnormal   Collection Time: 12/30/15  4:10 PM  Result Value Ref Range   Glucose-Capillary 260 (H) 65 - 99 mg/dL  Basic metabolic panel     Status: Abnormal   Collection Time: 12/30/15  4:45 PM  Result Value Ref Range  Sodium 135 135 - 145 mmol/L   Potassium 3.6 3.5 - 5.1 mmol/L   Chloride 106 101 - 111 mmol/L   CO2 17 (L) 22 - 32 mmol/L   Glucose, Bld 270 (H) 65 - 99 mg/dL   BUN 6 6 - 20 mg/dL   Creatinine, Ser 1.15 (H) 0.44 - 1.00 mg/dL   Calcium 8.2 (L) 8.9 - 10.3 mg/dL   GFR calc non Af Amer >60 >60 mL/min   GFR calc Af Amer >60 >60 mL/min    Comment: (NOTE) The eGFR has been calculated using the CKD EPI equation. This calculation has not been validated in all clinical situations. eGFR's persistently <60 mL/min signify possible Chronic Kidney Disease.    Anion gap 12 5 - 15  Glucose, capillary     Status: Abnormal   Collection Time: 12/30/15  5:51 PM  Result Value Ref Range   Glucose-Capillary 233 (H) 65 - 99 mg/dL  Glucose, capillary     Status: Abnormal   Collection Time: 12/30/15  9:15 PM  Result Value Ref Range   Glucose-Capillary 249 (H) 65 - 99 mg/dL  Basic metabolic panel     Status: Abnormal   Collection Time: 12/31/15  5:23 AM  Result Value Ref Range    Sodium 134 (L) 135 - 145 mmol/L   Potassium 3.3 (L) 3.5 - 5.1 mmol/L   Chloride 107 101 - 111 mmol/L   CO2 13 (L) 22 - 32 mmol/L   Glucose, Bld 277 (H) 65 - 99 mg/dL   BUN <5 (L) 6 - 20 mg/dL   Creatinine, Ser 1.07 (H) 0.44 - 1.00 mg/dL   Calcium 7.8 (L) 8.9 - 10.3 mg/dL   GFR calc non Af Amer >60 >60 mL/min   GFR calc Af Amer >60 >60 mL/min    Comment: (NOTE) The eGFR has been calculated using the CKD EPI equation. This calculation has not been validated in all clinical situations. eGFR's persistently <60 mL/min signify possible Chronic Kidney Disease.    Anion gap 14 5 - 15  Glucose, capillary     Status: Abnormal   Collection Time: 12/31/15  8:36 AM  Result Value Ref Range   Glucose-Capillary 253 (H) 65 - 99 mg/dL    No results found.  Review of Systems  Constitutional: Negative for fever, chills, weight loss, malaise/fatigue and diaphoresis.  HENT: Negative.   Eyes: Negative.   Respiratory: Negative.   Cardiovascular: Negative.   Gastrointestinal: Negative.   Genitourinary: Negative.   Musculoskeletal: Negative.   Skin:       Pain started about 1 week ago with the right side.  Neurological: Negative.  Negative for weakness.  Endo/Heme/Allergies: Negative.   Psychiatric/Behavioral: Negative.    Blood pressure 156/88, pulse 111, temperature 99.5 F (37.5 C), temperature source Oral, resp. rate 18, height '5\' 4"'$  (1.626 m), weight 97.8 kg (215 lb 9.8 oz), last menstrual period 12/23/2015, SpO2 100 %. Physical Exam  Constitutional: She is oriented to person, place, and time. She appears well-developed and well-nourished. No distress.  HENT:  Head: Normocephalic and atraumatic.  Nose: Nose normal.  Eyes: Conjunctivae and EOM are normal. Right eye exhibits no discharge. Left eye exhibits no discharge. No scleral icterus.  Neck: Normal range of motion. Neck supple. No JVD present. No tracheal deviation present. No thyromegaly present.  Cardiovascular: Normal rate, regular  rhythm, normal heart sounds and intact distal pulses.   No murmur heard. Respiratory: Effort normal and breath sounds normal. No respiratory distress. She has  no wheezes. She has no rales. She exhibits no tenderness.  Genitourinary:  See picture  Musculoskeletal: She exhibits no edema or tenderness.  Neurological: She is alert and oriented to person, place, and time. No cranial nerve deficit.  Skin: Skin is warm and dry. No rash noted. She is not diaphoretic. No erythema. No pallor.     Psychiatric: She has a normal mood and affect. Her behavior is normal. Judgment and thought content normal.    Assessment/Plan: Bilateral gluteal abscesses, right greater than the left. DKA AODM Body mass index is 36.9  Plan:  I&D in OR tomorrow.  NPO after MN.   Kamayah Pillay 12/31/2015, 12:33 PM

## 2016-01-01 ENCOUNTER — Inpatient Hospital Stay (HOSPITAL_COMMUNITY): Payer: Medicaid Other | Admitting: Certified Registered Nurse Anesthetist

## 2016-01-01 ENCOUNTER — Encounter (HOSPITAL_COMMUNITY)
Admission: EM | Disposition: A | Discharge status: Home Health | Payer: Self-pay | Source: Home / Self Care | Attending: Family Medicine

## 2016-01-01 HISTORY — PX: INCISION AND DRAINAGE ABSCESS: SHX5864

## 2016-01-01 LAB — BASIC METABOLIC PANEL
ANION GAP: 20 — AB (ref 5–15)
Anion gap: 16 — ABNORMAL HIGH (ref 5–15)
Anion gap: 11 (ref 5–15)
BUN: <5 mg/dL — ABNORMAL LOW (ref 6–20)
CALCIUM: 8.3 mg/dL — AB (ref 8.9–10.3)
CHLORIDE: 106 mmol/L (ref 101–111)
CO2: 11 mmol/L — AB (ref 22–32)
CO2: 15 mmol/L — ABNORMAL LOW (ref 22–32)
CO2: 18 mmol/L — AB (ref 22–32)
CREATININE: 1.13 mg/dL — AB (ref 0.44–1.00)
CREATININE: 0.89 mg/dL (ref 0.44–1.00)
Calcium: 8.3 mg/dL — ABNORMAL LOW (ref 8.9–10.3)
Calcium: 8.3 mg/dL — ABNORMAL LOW (ref 8.9–10.3)
Chloride: 105 mmol/L (ref 101–111)
Chloride: 104 mmol/L (ref 101–111)
Creatinine, Ser: 1.26 mg/dL — ABNORMAL HIGH (ref 0.44–1.00)
GFR calc Af Amer: >60 mL/min (ref >60)
GFR calc Af Amer: >60 mL/min (ref >60)
GFR calc non Af Amer: >60 mL/min (ref >60)
GFR, EST NON AFRICAN AMERICAN: 54 mL/min — AB (ref >60)
GLUCOSE: 336 mg/dL — AB (ref 65–99)
GLUCOSE: 420 mg/dL — AB (ref 65–99)
GLUCOSE: 339 mg/dL — AB (ref 65–99)
POTASSIUM: 3.9 mmol/L (ref 3.5–5.1)
Potassium: 4.2 mmol/L (ref 3.5–5.1)
Potassium: 3.9 mmol/L (ref 3.5–5.1)
SODIUM: 135 mmol/L (ref 135–145)
Sodium: 136 mmol/L (ref 135–145)
Sodium: 135 mmol/L (ref 135–145)

## 2016-01-01 LAB — CBC WITH DIFFERENTIAL/PLATELET
BAND NEUTROPHILS: 0 %
BASOS ABS: 0 10*3/uL (ref 0.0–0.1)
BASOS PCT: 0 %
BLASTS: 0 %
EOS ABS: 0 10*3/uL (ref 0.0–0.7)
Eosinophils Relative: 0 %
HCT: 32.8 % — ABNORMAL LOW (ref 36.0–46.0)
Hemoglobin: 10.8 g/dL — ABNORMAL LOW (ref 12.0–15.0)
LYMPHS PCT: 10 %
Lymphs Abs: 2.1 10*3/uL (ref 0.7–4.0)
MCH: 28.6 pg (ref 26.0–34.0)
MCHC: 32.9 g/dL (ref 30.0–36.0)
MCV: 87 fL (ref 78.0–100.0)
METAMYELOCYTES PCT: 0 %
MONO ABS: 1.5 10*3/uL — AB (ref 0.1–1.0)
MONOS PCT: 7 %
Myelocytes: 0 %
NEUTROS ABS: 17.8 10*3/uL — AB (ref 1.7–7.7)
Neutrophils Relative %: 83 %
OTHER: 0 %
PROMYELOCYTES ABS: 0 %
Platelets: 330 10*3/uL (ref 150–400)
RBC: 3.77 MIL/uL — ABNORMAL LOW (ref 3.87–5.11)
RDW: 14.8 % (ref 11.5–15.5)
WBC: 21.4 10*3/uL — ABNORMAL HIGH (ref 4.0–10.5)
nRBC: 0 /100 WBC

## 2016-01-01 LAB — GLUCOSE, CAPILLARY
GLUCOSE-CAPILLARY: 242 mg/dL — AB (ref 65–99)
GLUCOSE-CAPILLARY: 394 mg/dL — AB (ref 65–99)
Glucose-Capillary: 207 mg/dL — ABNORMAL HIGH (ref 65–99)
Glucose-Capillary: 306 mg/dL — ABNORMAL HIGH (ref 65–99)
Glucose-Capillary: 281 mg/dL — ABNORMAL HIGH (ref 65–99)

## 2016-01-01 SURGERY — INCISION AND DRAINAGE, ABSCESS
Anesthesia: General | Site: Buttocks | Laterality: Bilateral

## 2016-01-01 MED ORDER — ARTIFICIAL TEARS OP OINT
TOPICAL_OINTMENT | OPHTHALMIC | Status: AC
  Filled 2016-01-01: qty 3.5

## 2016-01-01 MED ORDER — ONDANSETRON HCL 4 MG/2ML IJ SOLN: 4.0000 mg | Freq: Once | INTRAMUSCULAR | Status: DC | PRN

## 2016-01-01 MED ORDER — FENTANYL CITRATE (PF) 100 MCG/2ML IJ SOLN
INTRAMUSCULAR | Status: DC | PRN
  Administered 2016-01-01: 50 ug via INTRAVENOUS
  Administered 2016-01-01: 150 ug via INTRAVENOUS
  Administered 2016-01-01: 50 ug via INTRAVENOUS

## 2016-01-01 MED ORDER — POLYETHYLENE GLYCOL 3350 17 G PO PACK
17.0000 g | PACK | Freq: Every day | ORAL | Status: DC
  Administered 2016-01-02 – 2016-01-05 (×4): 17 g via ORAL
  Filled 2016-01-01 (×4): qty 1

## 2016-01-01 MED ORDER — MIDAZOLAM HCL 5 MG/5ML IJ SOLN
INTRAMUSCULAR | Status: DC | PRN
  Administered 2016-01-01: 2 mg via INTRAVENOUS

## 2016-01-01 MED ORDER — LIDOCAINE HCL (CARDIAC) 20 MG/ML IV SOLN
INTRAVENOUS | Status: AC
  Filled 2016-01-01: qty 5

## 2016-01-01 MED ORDER — SUGAMMADEX SODIUM 200 MG/2ML IV SOLN
INTRAVENOUS | Status: AC
  Filled 2016-01-01: qty 2

## 2016-01-01 MED ORDER — SUGAMMADEX SODIUM 200 MG/2ML IV SOLN
INTRAVENOUS | Status: DC | PRN
  Administered 2016-01-01: 200 mg via INTRAVENOUS

## 2016-01-01 MED ORDER — LIDOCAINE HCL (CARDIAC) 20 MG/ML IV SOLN
INTRAVENOUS | Status: DC | PRN
  Administered 2016-01-01: 50 mg via INTRAVENOUS
  Administered 2016-01-01: 50 mg via INTRATRACHEAL

## 2016-01-01 MED ORDER — MIDAZOLAM HCL 2 MG/2ML IJ SOLN
INTRAMUSCULAR | Status: AC
  Filled 2016-01-01: qty 2

## 2016-01-01 MED ORDER — ONDANSETRON HCL 4 MG/2ML IJ SOLN
4.0000 mg | Freq: Four times a day (QID) | INTRAMUSCULAR | Status: DC | PRN
  Filled 2016-01-01: qty 2

## 2016-01-01 MED ORDER — 0.9 % SODIUM CHLORIDE (POUR BTL) OPTIME
TOPICAL | Status: DC | PRN
  Administered 2016-01-01: 1000 mL

## 2016-01-01 MED ORDER — METRONIDAZOLE IN NACL 5-0.79 MG/ML-% IV SOLN
500.0000 mg | Freq: Three times a day (TID) | INTRAVENOUS | Status: DC
  Administered 2016-01-01 – 2016-01-03 (×5): 500 mg via INTRAVENOUS
  Filled 2016-01-01 (×5): qty 100

## 2016-01-01 MED ORDER — ONDANSETRON HCL 4 MG/2ML IJ SOLN
INTRAMUSCULAR | Status: DC | PRN
  Administered 2016-01-01: 4 mg via INTRAVENOUS

## 2016-01-01 MED ORDER — EPHEDRINE SULFATE 50 MG/ML IJ SOLN
INTRAMUSCULAR | Status: AC
  Filled 2016-01-01: qty 1

## 2016-01-01 MED ORDER — DOCUSATE SODIUM 100 MG PO CAPS
100.0000 mg | ORAL_CAPSULE | Freq: Two times a day (BID) | ORAL | Status: DC
  Administered 2016-01-01 – 2016-01-02 (×2): 100 mg via ORAL
  Filled 2016-01-01 (×2): qty 1

## 2016-01-01 MED ORDER — LISINOPRIL 40 MG PO TABS
40.0000 mg | ORAL_TABLET | Freq: Every day | ORAL | Status: DC
  Administered 2016-01-01: 40 mg via ORAL

## 2016-01-01 MED ORDER — PROPOFOL 10 MG/ML IV BOLUS
INTRAVENOUS | Status: AC
  Filled 2016-01-01: qty 20

## 2016-01-01 MED ORDER — HYDROMORPHONE HCL 1 MG/ML IJ SOLN: 0.2500 mg | INTRAMUSCULAR | Status: DC | PRN

## 2016-01-01 MED ORDER — FENTANYL CITRATE (PF) 250 MCG/5ML IJ SOLN
INTRAMUSCULAR | Status: AC
  Filled 2016-01-01: qty 5

## 2016-01-01 MED ORDER — KETOROLAC TROMETHAMINE 30 MG/ML IJ SOLN: 30.0000 mg | Freq: Three times a day (TID) | INTRAMUSCULAR | Status: DC | PRN

## 2016-01-01 MED ORDER — HYDROCODONE-ACETAMINOPHEN 7.5-325 MG PO TABS
1.0000 | ORAL_TABLET | ORAL | Status: DC | PRN
Start: 2016-01-01 — End: 2016-01-05
  Administered 2016-01-01: 1 via ORAL
  Administered 2016-01-02 – 2016-01-05 (×7): 2 via ORAL
  Filled 2016-01-01 (×3): qty 2
  Filled 2016-01-01: qty 1
  Filled 2016-01-01 (×5): qty 2

## 2016-01-01 MED ORDER — HEPARIN SODIUM (PORCINE) 5000 UNIT/ML IJ SOLN
5000.0000 [IU] | Freq: Three times a day (TID) | INTRAMUSCULAR | Status: DC
  Administered 2016-01-02 – 2016-01-05 (×10): 5000 [IU] via SUBCUTANEOUS
  Filled 2016-01-01 (×9): qty 1

## 2016-01-01 MED ORDER — MEPERIDINE HCL 25 MG/ML IJ SOLN: 6.2500 mg | INTRAMUSCULAR | Status: DC | PRN

## 2016-01-01 MED ORDER — LACTATED RINGERS IV SOLN
INTRAVENOUS | Status: DC
  Administered 2016-01-01 (×2): via INTRAVENOUS

## 2016-01-01 MED ORDER — STERILE WATER FOR INJECTION IJ SOLN
INTRAMUSCULAR | Status: AC
  Filled 2016-01-01: qty 10

## 2016-01-01 MED ORDER — ONDANSETRON HCL 4 MG/2ML IJ SOLN
INTRAMUSCULAR | Status: AC
  Filled 2016-01-01: qty 2

## 2016-01-01 MED ORDER — VANCOMYCIN HCL IN DEXTROSE 1-5 GM/200ML-% IV SOLN
1000.0000 mg | Freq: Two times a day (BID) | INTRAVENOUS | Status: DC
  Administered 2016-01-01 – 2016-01-02 (×2): 1000 mg via INTRAVENOUS
  Filled 2016-01-01 (×3): qty 200

## 2016-01-01 MED ORDER — ROCURONIUM BROMIDE 100 MG/10ML IV SOLN
INTRAVENOUS | Status: DC | PRN
  Administered 2016-01-01: 40 mg via INTRAVENOUS

## 2016-01-01 MED ORDER — PROPOFOL 10 MG/ML IV BOLUS
INTRAVENOUS | Status: DC | PRN
  Administered 2016-01-01: 150 mg via INTRAVENOUS

## 2016-01-01 MED ORDER — MORPHINE SULFATE (PF) 2 MG/ML IV SOLN: 1.0000 mg | INTRAVENOUS | Status: DC | PRN

## 2016-01-01 SURGICAL SUPPLY — 47 items
CANISTER SUCTION 2500CC (MISCELLANEOUS) ×3 IMPLANT
CLEANER TIP ELECTROSURG 2X2 (MISCELLANEOUS) ×2 IMPLANT
COVER SURGICAL LIGHT HANDLE (MISCELLANEOUS) ×3 IMPLANT
DRAPE LAPAROTOMY T 98X78 PEDS (DRAPES) ×2 IMPLANT
DRAPE LAPAROTOMY TRNSV 102X78 (DRAPE) IMPLANT
DRAPE UTILITY XL STRL (DRAPES) ×2 IMPLANT
DRSG PAD ABDOMINAL 8X10 ST (GAUZE/BANDAGES/DRESSINGS) ×3 IMPLANT
ELECT REM PT RETURN 9FT ADLT (ELECTROSURGICAL) ×3
ELECTRODE REM PT RTRN 9FT ADLT (ELECTROSURGICAL) IMPLANT
GAUZE IODOFORM PACK 1/2 7832 (GAUZE/BANDAGES/DRESSINGS) ×2 IMPLANT
GAUZE SPONGE 4X4 12PLY STRL (GAUZE/BANDAGES/DRESSINGS) ×3 IMPLANT
GAUZE SPONGE 4X4 16PLY XRAY LF (GAUZE/BANDAGES/DRESSINGS) ×1 IMPLANT
GEL ULTRASOUND 20GR AQUASONIC (MISCELLANEOUS) ×2 IMPLANT
GLOVE BIO SURGEON STRL SZ7 (GLOVE) ×2 IMPLANT
GLOVE BIOGEL M STRL SZ7.5 (GLOVE) ×3 IMPLANT
GLOVE BIOGEL PI IND STRL 7.0 (GLOVE) IMPLANT
GLOVE BIOGEL PI IND STRL 8 (GLOVE) ×2 IMPLANT
GLOVE BIOGEL PI INDICATOR 7.0 (GLOVE) ×4
GLOVE BIOGEL PI INDICATOR 8 (GLOVE) ×2
GLOVE ECLIPSE 7.0 STRL STRAW (GLOVE) ×2 IMPLANT
GOWN STRL REUS W/ TWL LRG LVL3 (GOWN DISPOSABLE) ×1 IMPLANT
GOWN STRL REUS W/ TWL XL LVL3 (GOWN DISPOSABLE) ×1 IMPLANT
GOWN STRL REUS W/TWL LRG LVL3 (GOWN DISPOSABLE) ×3
GOWN STRL REUS W/TWL XL LVL3 (GOWN DISPOSABLE) ×3
KIT BASIN OR (CUSTOM PROCEDURE TRAY) ×3 IMPLANT
KIT ROOM TURNOVER OR (KITS) ×4 IMPLANT
NDL 18GX1X1/2 (RX/OR ONLY) (NEEDLE) IMPLANT
NEEDLE 18GX1X1/2 (RX/OR ONLY) (NEEDLE) ×6 IMPLANT
NS IRRIG 1000ML POUR BTL (IV SOLUTION) ×3 IMPLANT
PACK LITHOTOMY IV (CUSTOM PROCEDURE TRAY) ×1 IMPLANT
PACK SURGICAL SETUP 50X90 (CUSTOM PROCEDURE TRAY) ×2 IMPLANT
PAD ARMBOARD 7.5X6 YLW CONV (MISCELLANEOUS) ×8 IMPLANT
PEN SKIN MARKING BROAD (MISCELLANEOUS) ×2 IMPLANT
PENCIL BUTTON HOLSTER BLD 10FT (ELECTRODE) ×2 IMPLANT
SOLUTION BETADINE 4OZ (MISCELLANEOUS) ×2 IMPLANT
SPONGE LAP 18X18 X RAY DECT (DISPOSABLE) ×4 IMPLANT
SWAB COLLECTION DEVICE MRSA (MISCELLANEOUS) ×3 IMPLANT
SYR 30ML LL (SYRINGE) ×2 IMPLANT
SYR BULB IRRIGATION 50ML (SYRINGE) ×2 IMPLANT
TOWEL OR 17X24 6PK STRL BLUE (TOWEL DISPOSABLE) ×3 IMPLANT
TOWEL OR 17X26 10 PK STRL BLUE (TOWEL DISPOSABLE) ×3 IMPLANT
TUBE ANAEROBIC SPECIMEN COL (MISCELLANEOUS) ×3 IMPLANT
TUBE CONNECTING 12'X1/4 (SUCTIONS) ×1
TUBE CONNECTING 12X1/4 (SUCTIONS) ×2 IMPLANT
UNDERPAD 30X30 INCONTINENT (UNDERPADS AND DIAPERS) ×3 IMPLANT
WATER STERILE IRR 1000ML POUR (IV SOLUTION) ×1 IMPLANT
YANKAUER SUCT BULB TIP NO VENT (SUCTIONS) ×3 IMPLANT

## 2016-01-01 NOTE — Progress Notes (Signed)
Family Medicine Teaching Service Daily Progress Note Intern Pager: [phone]  Patient name: Rebecca Rhodes Medical record number: [id number] Date of birth: 09-26-1981 Age: 35 y.o. Gender: female  Primary Care Provider: Tawni Carnes, MD Consultants: none Code Status: full  Pt Overview and Major Events to Date:  01/17- admitted with DKA  Assessment and Plan: Syndey Rhodes is a 35 y.o. female presenting with DKA . PMH is significant for T2DM, HTN, HLD   DKA, resolved: Likely contributors are abscess and not taking her Novolin as prescribed. CBGs in the 260s overnight, 207 this am. NPO since midnight. - Cardiac monitoring - Continue resistant SSI - Currently receiving Lantus to 45 units daily and Novolog 10 units tid with meals. - Will discharge on Novolin 70/30. - Consult to care management for medication needs. Pt is applying for Medicaid and will continue Novolin 70/30 until she gets Medicaid.  DM-2, poorly controlled: A1c 16.2%. Prescribed Novolin 70/30 50 units bid, but has been taking it daily because she can't afford it. - Continue management as above - Will adjust home dose on discharge  Buttock abscess: U/S showing 3.6 cm x 2.3 cm x 3.7 cm on left buttocks. Significant edema without drainable abscess involving the right buttock region. - Surgery consulted, will perform I&D today. - Continue Levaquin and Clindamycin - Wound culture growing moderate Group B strep - Blood cultures showing no growth x 2 days  Hypokalemia: K 3.3 yesterday morning. BMET from this morning was not collected, as Pt was down in pre-op. - Will give K-Dur bid.  AKI, improving. Cr 2.48 on admission (b/l 0.8-0.9) > 1.07 yesterday morning. Likely from dehydration in the setting of DKA. - IVFs as above - Avoid nephrotoxic agents - Daily BMPs  HTN, improving. BP 146/97 this morning. - Hydralazine  prn SBP>180, DBP>110 - Will restart Lisinopril today. - Follow-up as an outpatient.  FEN/GI: NPO at  midnight for I&D today; Protonix Prophylaxis: Sub-q heparin  Disposition: Anticipate discharge home in 1-3 days.  Subjective:  Unable to see patient this morning, as she was in pre-op for surgery. Will attempt to examine patient this afternoon.  Objective: Temp:  [97.9 F (36.6 C)-100.1 F (37.8 C)] 100.1 F (37.8 C) (01/20 0226) Pulse Rate:  [107-120] 120 (01/20 0226) Resp:  [19-22] 21 (01/20 0226) BP: (143-146)/(83-97) 146/97 mmHg (01/20 0226) SpO2:  [99 %-100 %] 99 % (01/20 0226) Physical Exam:  Unable to see patient this morning, as she was in pre-op for surgery. Will attempt to examine patient this afternoon.  Laboratory:  Recent Labs Lab 12/28/15 1724  WBC 18.0*  HGB 12.5  HCT 39.4  PLT 311    Recent Labs Lab 12/30/15 1006 12/30/15 1645 12/31/15 0523  NA 135 135 134*  K 3.4* 3.6 3.3*  CL 111 106 107  CO2 10* 17* 13*  BUN 7 6 <5*  CREATININE 1.50* 1.15* 1.07*  CALCIUM 8.2* 8.2* 7.8*  GLUCOSE 395* 270* 277*   Hgb A1c: 16.2%  Imaging/Diagnostic Tests: US Pelvis Limited  12/31/2015  CLINICAL DATA:  Left and right buttock abscesses for 1 week. History of hypertension diabetes. Patient reports drainage from the right buttock abscess. EXAM: LIMITED ULTRASOUND OF PELVIS TECHNIQUE: Limited transabdominal ultrasound examination of the pelvis was performed. COMPARISON:  None. FINDINGS: Ultrasound is performed of the medial gluteal regions bilaterally. Right: There is diffuse subcutaneous edema. Small focal hypoechoic collection is 1.3 x 0.5 x 1.2 cm. Left: Subcutaneous edema. Complex hypoechoic collection is 3.6 x 2.3 x 3.7 cm.  IMPRESSION: 1. Significant edema without drainable abscess involving the right buttock region. 2. 3.6 x 2.3 x 3.7 cm abscess in the left buttock. Electronically Signed   By: Norva Pavlov M.D.   On: 12/31/2015 13:31    Campbell Stall, MD 01/01/2016, 7:29 AM PGY-1, [facility] Family Medicine FPTS Intern pager: [phone], text pages welcome

## 2016-01-01 NOTE — H&P (View-Only) (Signed)
Reason for Consult:bilateral gluteal cleft abscesses; right greater than left. Referring Physician: Dr. Hyman Bible  Rebecca Rhodes is an 35 y.o. female.  HPI: Pt presented to Ed on 12/29/15 with rectal abscess, she says she has had this before.  Work up in the ED shows she had a glucose 748, she became SOB in the ED, and was found to be in DKA.  Pt had a quarter size abscess on admit on the right side and it was draining at the time, cultures were obtained. She has a hx of MRSA per pt. Report..  She was admitted and her DKA was treated.  Culture grew: MODERATE GROUP B STREP(S.AGALACTIAE)ISOLATED.  She is on day 4 of clindamycin and day 3 of Cipro.  We are called because even though the right side is draining it is getting larger and there is now one found on the left side.  The right side is draining purulent fluid at this time.  It is 7-8 cm in diameter now.  The one on the left that is new is 3 cm and not draining.    Past Medical History  Diagnosis Date  . HTN (hypertension) 06/04/2012  . Hyperlipidemia 06/04/2012  . Diabetes mellitus ([place]) 06/04/2012  . HSV-2 infection   . DKA (diabetic ketoacidoses) ([place])   . Acute respiratory failure [facility])     Past Surgical History  Procedure Laterality Date  . Irrigation and debridement abscess  10/31/2012    Procedure: IRRIGATION AND DEBRIDEMENT ABSCESS;  Surgeon: Gwenyth Ober, MD;  Location: [facility];  Service: General;  Laterality: Bilateral;  . Cesarean section N/A 08/08/2013    Procedure: CESAREAN SECTION;  Surgeon: Osborne Oman, MD;  Location: [facility];  Service: Obstetrics;  Laterality: N/A;    Family History  Problem Relation Age of Onset  . Hypertension Mother   . Stroke Mother   . Heart disease Father   . Diabetes Father   . Cancer Maternal Grandfather   . Hypertension Brother   . Diabetes Paternal Aunt   . Diabetes Paternal Uncle   . Diabetes Paternal Grandmother   . Diabetes Paternal Grandfather     Social History:  reports that she  has never smoked. She has never used smokeless tobacco. She reports that she does not drink alcohol or use illicit drugs.  Allergies:  Allergies  Allergen Reactions  . Penicillins Rash   Prior to Admission medications   Medication Sig Start Date End Date Taking? Authorizing Provider  insulin NPH-regular Human (NOVOLIN 70/30) (70-30) 100 UNIT/ML injection Inject 50 Units into the skin 2 (two) times daily with a meal.   Yes Historical Provider, MD  lisinopril (PRINIVIL,ZESTRIL) 40 MG tablet Take 1 tablet (40 mg total) by mouth daily. 10/23/14  Yes [place] N Rumley, DO  atorvastatin (LIPITOR) 40 MG tablet Take 1 tablet (40 mg total) by mouth daily at 6 PM. 08/31/14   Frazier Richards, MD  glucose blood (ACCU-CHEK SMARTVIEW) test strip Check sugar 3 times a day 12/16/14   Leone Brand, MD  insulin aspart protamine- aspart (NOVOLOG MIX 70/30) (70-30) 100 UNIT/ML injection Inject 0.3 mLs (30 Units total) into the skin 2 (two) times daily with a meal. 10/13/15   Leone Brand, MD    Medications:  Scheduled: . clindamycin  300 mg Oral 3 times per day  . heparin  5,000 Units Subcutaneous 3 times per day  . insulin aspart  0-20 Units Subcutaneous TID WC  . insulin aspart  0-5  Units Subcutaneous QHS  . insulin aspart  10 Units Subcutaneous TID WC  . insulin glargine  45 Units Subcutaneous Daily  . levofloxacin  500 mg Oral Daily  . pantoprazole  40 mg Oral Daily  . potassium chloride  40 mEq Oral BID   Continuous: . sodium chloride 1,000 mL (12/30/15 1759)  . sodium chloride 200 mL/hr at 12/31/15 0615   Anti-infectives    Start     Dose/Rate Route Frequency Ordered Stop   12/31/15 1032  levofloxacin (LEVAQUIN) tablet 500 mg     500 mg Oral Daily 12/31/15 1020     12/30/15 0000   1,000 mg 200 mL/hr over 60 Minutes Intravenous Every 24 hours 12/29/15 0137 12/29/15 1033   12/29/15 1400  clindamycin (CLEOCIN) capsule 300 mg     300 mg Oral 3 times per day 12/29/15 1031     12/29/15 1045   1  tablet Oral Every 12 hours 12/29/15 1031 12/29/15 1032   12/29/15 1045  ciprofloxacin (CIPRO) tablet 500 mg  Status:  Discontinued     500 mg Oral 2 times daily 12/29/15 1032 12/31/15 1019   12/29/15 0200  metroNIDAZOLE (FLAGYL) IVPB 500 mg  Status:  Discontinued     500 mg 100 mL/hr over 60 Minutes Intravenous Every 8 hours 12/29/15 0137 12/29/15 1033   12/29/15 0145  vancomycin (VANCOCIN) IVPB 1000 mg/200 mL premix  Status:  Discontinued     1,000 mg 200 mL/hr over 60 Minutes Intravenous  Once 12/29/15 0137 12/29/15 1033   12/29/15 0145  cefTRIAXone (ROCEPHIN) 1 g in dextrose 5 % 50 mL IVPB  Status:  Discontinued     1 g 100 mL/hr over 30 Minutes Intravenous Daily at bedtime 12/29/15 0137 12/29/15 1033   12/28/15 2330  clindamycin (CLEOCIN) IVPB 600 mg     600 mg 100 mL/hr over 30 Minutes Intravenous  Once 12/28/15 2319 12/28/15 2341      Results for orders placed or performed during the hospital encounter of 12/28/15 (from the past 48 hour(s))  Glucose, capillary     Status: Abnormal   Collection Time: 12/29/15  1:13 PM  Result Value Ref Range   Glucose-Capillary 222 (H) 65 - 99 mg/dL  Glucose, capillary     Status: Abnormal   Collection Time: 12/29/15  2:16 PM  Result Value Ref Range   Glucose-Capillary 224 (H) 65 - 99 mg/dL   Comment 1 Notify RN    Comment 2 Document in Chart   Glucose, capillary     Status: Abnormal   Collection Time: 12/29/15  3:19 PM  Result Value Ref Range   Glucose-Capillary 229 (H) 65 - 99 mg/dL   Comment 1 Notify RN    Comment 2 Document in Chart   Basic metabolic panel     Status: Abnormal   Collection Time: 12/29/15  3:43 PM  Result Value Ref Range   Sodium 138 135 - 145 mmol/L   Potassium 3.3 (L) 3.5 - 5.1 mmol/L   Chloride 110 101 - 111 mmol/L   CO2 19 (L) 22 - 32 mmol/L   Glucose, Bld 238 (H) 65 - 99 mg/dL   BUN 9 6 - 20 mg/dL   Creatinine, Ser 1.09 (H) 0.44 - 1.00 mg/dL   Calcium 8.5 (L) 8.9 - 10.3 mg/dL   GFR calc non Af Amer >60 >60  mL/min   GFR calc Af Amer >60 >60 mL/min    Comment: (NOTE) The eGFR has been calculated using the  CKD EPI equation. This calculation has not been validated in all clinical situations. eGFR's persistently <60 mL/min signify possible Chronic Kidney Disease.    Anion gap 9 5 - 15  Glucose, capillary     Status: Abnormal   Collection Time: 12/29/15  4:19 PM  Result Value Ref Range   Glucose-Capillary 206 (H) 65 - 99 mg/dL  Glucose, capillary     Status: Abnormal   Collection Time: 12/29/15  5:22 PM  Result Value Ref Range   Glucose-Capillary 249 (H) 65 - 99 mg/dL  Glucose, capillary     Status: Abnormal   Collection Time: 12/29/15  9:37 PM  Result Value Ref Range   Glucose-Capillary 413 (H) 65 - 99 mg/dL  Glucose, capillary     Status: Abnormal   Collection Time: 12/29/15 11:32 PM  Result Value Ref Range   Glucose-Capillary 383 (H) 65 - 99 mg/dL  Basic metabolic panel     Status: Abnormal   Collection Time: 12/30/15  5:35 AM  Result Value Ref Range   Sodium 136 135 - 145 mmol/L   Potassium 3.6 3.5 - 5.1 mmol/L   Chloride 109 101 - 111 mmol/L   CO2 10 (L) 22 - 32 mmol/L   Glucose, Bld 359 (H) 65 - 99 mg/dL   BUN 8 6 - 20 mg/dL   Creatinine, Ser 1.42 (H) 0.44 - 1.00 mg/dL   Calcium 8.2 (L) 8.9 - 10.3 mg/dL   GFR calc non Af Amer 47 (L) >60 mL/min   GFR calc Af Amer 54 (L) >60 mL/min    Comment: (NOTE) The eGFR has been calculated using the CKD EPI equation. This calculation has not been validated in all clinical situations. eGFR's persistently <60 mL/min signify possible Chronic Kidney Disease.    Anion gap 17 (H) 5 - 15  Glucose, capillary     Status: Abnormal   Collection Time: 12/30/15  7:55 AM  Result Value Ref Range   Glucose-Capillary 307 (H) 65 - 99 mg/dL  Basic metabolic panel     Status: Abnormal   Collection Time: 12/30/15 10:06 AM  Result Value Ref Range   Sodium 135 135 - 145 mmol/L   Potassium 3.4 (L) 3.5 - 5.1 mmol/L   Chloride 111 101 - 111 mmol/L    CO2 10 (L) 22 - 32 mmol/L   Glucose, Bld 395 (H) 65 - 99 mg/dL   BUN 7 6 - 20 mg/dL   Creatinine, Ser 1.50 (H) 0.44 - 1.00 mg/dL   Calcium 8.2 (L) 8.9 - 10.3 mg/dL   GFR calc non Af Amer 44 (L) >60 mL/min   GFR calc Af Amer 51 (L) >60 mL/min    Comment: (NOTE) The eGFR has been calculated using the CKD EPI equation. This calculation has not been validated in all clinical situations. eGFR's persistently <60 mL/min signify possible Chronic Kidney Disease.    Anion gap 14 5 - 15  Glucose, capillary     Status: Abnormal   Collection Time: 12/30/15 11:45 AM  Result Value Ref Range   Glucose-Capillary 346 (H) 65 - 99 mg/dL   Comment 1 Notify RN    Comment 2 Document in Chart   Glucose, capillary     Status: Abnormal   Collection Time: 12/30/15  4:10 PM  Result Value Ref Range   Glucose-Capillary 260 (H) 65 - 99 mg/dL  Basic metabolic panel     Status: Abnormal   Collection Time: 12/30/15  4:45 PM  Result Value Ref Range  Sodium 135 135 - 145 mmol/L   Potassium 3.6 3.5 - 5.1 mmol/L   Chloride 106 101 - 111 mmol/L   CO2 17 (L) 22 - 32 mmol/L   Glucose, Bld 270 (H) 65 - 99 mg/dL   BUN 6 6 - 20 mg/dL   Creatinine, Ser 1.15 (H) 0.44 - 1.00 mg/dL   Calcium 8.2 (L) 8.9 - 10.3 mg/dL   GFR calc non Af Amer >60 >60 mL/min   GFR calc Af Amer >60 >60 mL/min    Comment: (NOTE) The eGFR has been calculated using the CKD EPI equation. This calculation has not been validated in all clinical situations. eGFR's persistently <60 mL/min signify possible Chronic Kidney Disease.    Anion gap 12 5 - 15  Glucose, capillary     Status: Abnormal   Collection Time: 12/30/15  5:51 PM  Result Value Ref Range   Glucose-Capillary 233 (H) 65 - 99 mg/dL  Glucose, capillary     Status: Abnormal   Collection Time: 12/30/15  9:15 PM  Result Value Ref Range   Glucose-Capillary 249 (H) 65 - 99 mg/dL  Basic metabolic panel     Status: Abnormal   Collection Time: 12/31/15  5:23 AM  Result Value Ref Range    Sodium 134 (L) 135 - 145 mmol/L   Potassium 3.3 (L) 3.5 - 5.1 mmol/L   Chloride 107 101 - 111 mmol/L   CO2 13 (L) 22 - 32 mmol/L   Glucose, Bld 277 (H) 65 - 99 mg/dL   BUN <5 (L) 6 - 20 mg/dL   Creatinine, Ser 1.07 (H) 0.44 - 1.00 mg/dL   Calcium 7.8 (L) 8.9 - 10.3 mg/dL   GFR calc non Af Amer >60 >60 mL/min   GFR calc Af Amer >60 >60 mL/min    Comment: (NOTE) The eGFR has been calculated using the CKD EPI equation. This calculation has not been validated in all clinical situations. eGFR's persistently <60 mL/min signify possible Chronic Kidney Disease.    Anion gap 14 5 - 15  Glucose, capillary     Status: Abnormal   Collection Time: 12/31/15  8:36 AM  Result Value Ref Range   Glucose-Capillary 253 (H) 65 - 99 mg/dL    No results found.  Review of Systems  Constitutional: Negative for fever, chills, weight loss, malaise/fatigue and diaphoresis.  HENT: Negative.   Eyes: Negative.   Respiratory: Negative.   Cardiovascular: Negative.   Gastrointestinal: Negative.   Genitourinary: Negative.   Musculoskeletal: Negative.   Skin:       Pain started about 1 week ago with the right side.  Neurological: Negative.  Negative for weakness.  Endo/Heme/Allergies: Negative.   Psychiatric/Behavioral: Negative.    Blood pressure 156/88, pulse 111, temperature 99.5 F (37.5 C), temperature source Oral, resp. rate 18, height '5\' 4"'$  (1.626 m), weight 97.8 kg (215 lb 9.8 oz), last menstrual period 12/23/2015, SpO2 100 %. Physical Exam  Constitutional: She is oriented to person, place, and time. She appears well-developed and well-nourished. No distress.  HENT:  Head: Normocephalic and atraumatic.  Nose: Nose normal.  Eyes: Conjunctivae and EOM are normal. Right eye exhibits no discharge. Left eye exhibits no discharge. No scleral icterus.  Neck: Normal range of motion. Neck supple. No JVD present. No tracheal deviation present. No thyromegaly present.  Cardiovascular: Normal rate, regular  rhythm, normal heart sounds and intact distal pulses.   No murmur heard. Respiratory: Effort normal and breath sounds normal. No respiratory distress. She has  no wheezes. She has no rales. She exhibits no tenderness.  Genitourinary:  See picture  Musculoskeletal: She exhibits no edema or tenderness.  Neurological: She is alert and oriented to person, place, and time. No cranial nerve deficit.  Skin: Skin is warm and dry. No rash noted. She is not diaphoretic. No erythema. No pallor.     Psychiatric: She has a normal mood and affect. Her behavior is normal. Judgment and thought content normal.    Assessment/Plan: Bilateral gluteal abscesses, right greater than the left. DKA AODM Body mass index is 36.9  Plan:  I&D in OR tomorrow.  NPO after MN.   Meg Niemeier 12/31/2015, 12:33 PM

## 2016-01-01 NOTE — Transfer of Care (Signed)
Immediate Anesthesia Transfer of Care Note  Patient: Rebecca Rhodes  Procedure(s) Performed: Procedure(s): INCISION AND DRAINAGE OF BILATERAL GLUTEAL ABSCESSES TIMES THREE (Bilateral)  Patient Location: PACU  Anesthesia Type:General  Level of Consciousness: awake and alert   Airway & Oxygen Therapy: Patient Spontanous Breathing and Patient connected to nasal cannula oxygen  Post-op Assessment: Report given to RN and Post -op Vital signs reviewed and stable  Post vital signs: Reviewed and stable  Last Vitals:  Filed Vitals:   12/31/15 2311 01/01/16 0226  BP: 143/87 146/97  Pulse: 114 120  Temp: 37.6 C 37.8 C  Resp: 22 21    Complications: No apparent anesthesia complications

## 2016-01-01 NOTE — Anesthesia Preprocedure Evaluation (Signed)
Anesthesia Evaluation  Patient identified by MRN, date of birth, ID band Patient awake    Reviewed: Allergy & Precautions, NPO status , Patient's Chart, lab work & pertinent test results  Airway Mallampati: I  TM Distance: >3 FB Neck ROM: Full    Dental   Pulmonary    Pulmonary exam normal        Cardiovascular hypertension, Pt. on medications Normal cardiovascular exam     Neuro/Psych    GI/Hepatic   Endo/Other  diabetes, Type 2, Insulin Dependent, Oral Hypoglycemic Agents  Renal/GU      Musculoskeletal   Abdominal   Peds  Hematology   Anesthesia Other Findings   Reproductive/Obstetrics                             Anesthesia Physical Anesthesia Plan  ASA: III  Anesthesia Plan: General   Post-op Pain Management:    Induction: Intravenous  Airway Management Planned: Oral ETT  Additional Equipment:   Intra-op Plan:   Post-operative Plan: Extubation in OR  Informed Consent: I have reviewed the patients History and Physical, chart, labs and discussed the procedure including the risks, benefits and alternatives for the proposed anesthesia with the patient or authorized representative who has indicated his/her understanding and acceptance.     Plan Discussed with: CRNA and Surgeon  Anesthesia Plan Comments:         Anesthesia Quick Evaluation  

## 2016-01-01 NOTE — Anesthesia Procedure Notes (Signed)
Procedure Name: Intubation Date/Time: 01/01/2016 9:05 AM Performed by: [place] Pink Pre-anesthesia Checklist: Patient identified, Emergency Drugs available, Suction available, Patient being monitored and Timeout performed Patient Re-evaluated:Patient Re-evaluated prior to inductionOxygen Delivery Method: Circle system utilized Preoxygenation: Pre-oxygenation with 100% oxygen Intubation Type: IV induction Ventilation: Mask ventilation without difficulty Laryngoscope Size: Mac and 4 Grade View: Grade I Tube type: Oral Tube size: 7.0 mm Number of attempts: 1 Airway Equipment and Method: Stylet and LTA kit utilized Placement Confirmation: ETT inserted through vocal cords under direct vision,  positive ETCO2 and breath sounds checked- equal and bilateral Secured at: 21 cm Tube secured with: Tape Dental Injury: Teeth and Oropharynx as per pre-operative assessment

## 2016-01-01 NOTE — Op Note (Signed)
NAMEANNALISA, COLONNA NO.:  [id number]  MEDICAL RECORD NO.:  [id number]  LOCATION:  [facility]                         FACILITY:  [facility]  PHYSICIAN:  Mary Sella. Andrey Campanile, MD, FACSDATE OF BIRTH:  1981/11/24  DATE OF PROCEDURE:  01/01/2016 DATE OF DISCHARGE:                              OPERATIVE REPORT   PREOPERATIVE DIAGNOSIS:  Bilateral gluteal abscesses.  POSTOPERATIVE DIAGNOSES: 1. Left gluteal abscess. 2. Right gluteal abscess x2.  PROCEDURE:  Incision, drainage, and debridement of bilateral gluteal abscesses x3.  SURGEON:  Mary Sella. Andrey Campanile, MD, FACS  ANESTHESIA:  General.  ESTIMATED BLOOD LOSS:  Less than 50 mL.  FINDINGS:  The patient had a left medial gluteal abscess measuring 3.5 x 3.5 cm.  She had a right medial gluteal abscess and a more anterior right medial gluteal abscesses as well, the total area on the right measured 7 x 8 cm.  Tool used for debridement - scapel  Frequency of surgical debridement.   Initial debridement  Measurement of total devitalized tissue (wound surface) before and after surgical debridement.   Left gluteal abscess - 3.5 x 3.5cm; after 4.5 x 4.5 cm; Right anterior gluteal abscess after - 4 x 3 cm; right lateral gluteal abscess after  - 3.5 cm long x 1cm wide  Area and depth of devitalized tissue removed from wound.  Left - 1 x 1cm; right lateral 2 x 2 cm x 2cm; right anterior 4 x 3 x 1 cm  Blood loss and description of tissue removed.  <30cc, skin, subcutaneous fat  INDICATIONS FOR PROCEDURE:  The patient is a 35 year old, morbidly obese, African American female who was admitted to the [facility] Medicine Service few days ago with uncontrolled diabetes and DKA as well as a right buttock abscess.  She was started on antibiotics by the Medicine team.  We were consulted yesterday because of ongoing drainage.  On exam, she had a significant medial right gluteal abscess as well as concerns for an area in the left medial gluteal area  that was fluctuant, these were too large to drain on the floor.  I recommended draining in the operating room.  I had a prolonged talk with the patient regarding risks and benefits, including but not limited to, bleeding, infection, injury to surrounding structures, scarring, recurrence, need for additional procedures, need for dressing change, chronic wound, blood clot formation, perioperative cardiac and pulmonary complications, as well as a typical recovery course.  DESCRIPTION OF PROCEDURE:  After obtaining informed consent, she was taken to OR 1 at [facility].  General endotracheal anesthesia was established.  She was then placed in the prone jack-knife position with the appropriate padding.  Sequential compression devices had been placed.  Her buttocks were taped apart.  Betadine was used to prep the area.  She was on broad-spectrum therapeutic antibiotics.  A surgical time-out was performed.  In the left medial gluteal area anteriorly, there was an area of fluctuance measuring 3.5 x 3 cm.  I aspirated it and got a fair amount of seropurulent fluid out, this was sent for aerobic and anaerobic culture.  I then made a cruciate incision directly in the center of this  area and had a fair amount of seropurulent material.  It did not really tunnel significantly.  It did tunnel little bit more cephalic.  Hemostasis was achieved.  I did debride about 1 x 1 cm of skin and subcutaneous fat with scalpel that was nonviable.  This cavity was irrigated and hemostasis was achieved.  I then turned my attention to the right side.  She had a large area of induration and cellulitis in the right medial gluteal area starting in the mid position extending anteriorly, this area measured 7 x 8 cm.  There were 2 areas where the skin was quite fluctuant.  I 1st made a cruciate incision in the right mid position with a 15 blade.  There was seropurulent fluid. There was some necrotic fat which was  sharply debrided with scalpel. Some of the skin edges were debrided for a total area of 2 x 2 cm.  This ended up being in the shape of sort of lightening bolt incision after the skin edges were debrided that were nonviable.  It tracked toward the perineum.  This area was packed.  I then incised the more anterior location on the right side with a 15 blade in the elliptical fashion excising the essential nonviable skin 4 x 1 x 3 cm.  There is seropurulent drainage from this area as well.  The 2 places on the right side did not tunnel or connect underneath the skin, so therefore I did not completely open up that entire side.  The cavities were irrigated. Hemostasis was achieved.  Half-inch iodoform gauze was placed into each of the 3 abscess cavities followed by 4 x 4 fluffs and mesh underwear. She was then placed in supine position, extubated and taken to recovery room in stable condition.  All needle, instrument, and sponge counts were correct x2.  There were no immediate complications.  The patient tolerated procedure well.     Mary Sella. Andrey Campanile, MD, FACS     EMW/MEDQ  D:  01/01/2016  T:  01/01/2016  Job:  [id number]

## 2016-01-01 NOTE — Brief Op Note (Signed)
12/28/2015 - 01/01/2016  10:05 AM  PATIENT:  Rebecca Rhodes  35 y.o. female  PRE-OPERATIVE DIAGNOSIS:  BILATERAL GLUTEAL ABSCESSES  POST-OPERATIVE DIAGNOSIS:  BILATERAL GLUTEAL ABSCESSES  PROCEDURE:  Procedure(s): INCISION AND DRAINAGE AND DEBRIDEMENT OF BILATERAL GLUTEAL ABSCESSES TIMES THREE (Bilateral) (left gluteal abscess 3.5 x 3.5cm; right gluteal abscess x 2)  SURGEON:  Surgeon(s) and Role:    * Gaynelle Adu, MD - Primary  PHYSICIAN ASSISTANT: none ASSISTANTS: none   ANESTHESIA:   general  EBL:  Total I/O In: 700 [I.V.:700] Out: -   BLOOD ADMINISTERED:none  DRAINS: none   LOCAL MEDICATIONS USED:  NONE  SPECIMEN:  Source of Specimen:  aerobic/anaerobic cultures of abscess  DISPOSITION OF SPECIMEN:  micro  COUNTS:  YES  TOURNIQUET:  * No tourniquets in log *  DICTATION: .Other Dictation: Dictation Number [id number]  PLAN OF CARE: pacu then back to floor  PATIENT DISPOSITION:  PACU - hemodynamically stable.   Delay start of Pharmacological VTE agent (>24hrs) due to surgical blood loss or risk of bleeding: no  Mary Sella. Andrey Campanile, MD, FACS General, Bariatric, & Minimally Invasive Surgery [facility], [place]

## 2016-01-01 NOTE — Consult Note (Signed)
PHARMACY CONSULT NOTE   Pharmacy Consult for :   Vancomycin Indication:  Bilateral Gluteal Abscesses, Group B Strep  Hospital Problems: Principal Problem:   Diabetic ketoacidosis without coma associated with type 2 diabetes mellitus (HCC) Active Problems:   HTN (hypertension)   Hyperlipidemia   Diabetes mellitus (HCC)   DKA (diabetic ketoacidoses) (HCC)   AKI (acute kidney injury) (HCC)   Abscess of buttock, right   Hypertensive urgency   DKA, type 2 (HCC)   Abscess   Diabetic ketoacidosis without coma (HCC)  Allergies: Allergies  Allergen Reactions  . Penicillins Rash   Patient Measurements: Height:  (162.6 cm) Weight: 215 lb 9.8 oz (97.8 kg) IBW/kg (Calculated) : 54.7  Vancomycin Dosing Weight:  97.8 kg    Vital Signs: Temp: 99.1 F (37.3 C) (01/20 1554) Temp Source: Oral (01/20 1554) BP: 123/73 mmHg (01/20 1554) Pulse Rate: 114 (01/20 1554)  Labs:  Recent Labs  12/31/15 0523 01/01/16 1235 01/01/16 1647  WBC  --  21.4*  --   HGB  --  10.8*  --   PLT  --  330  --   CREATININE 1.07* 1.26* 1.13*   Estimated Creatinine Clearance: 78.1 mL/min (by C-G formula based on Cr of 1.13).  Microbiology: Recent Results (from the past 720 hour(s))  Blood culture (routine x 2)     Status: None (Preliminary result)   Collection Time: 12/29/15  3:04 AM  Result Value Ref Range Status   Specimen Description BLOOD RIGHT HAND  Final   Special Requests BOTTLES DRAWN AEROBIC AND ANAEROBIC  Final   Culture NO GROWTH 3 DAYS  Final   Report Status PENDING  Incomplete  Blood culture (routine x 2)     Status: None (Preliminary result)   Collection Time: 12/29/15  3:20 AM  Result Value Ref Range Status   Specimen Description BLOOD LEFT ARM  Final   Special Requests BOTTLES DRAWN AEROBIC AND ANAEROBIC  Final   Culture NO GROWTH 3 DAYS  Final   Report Status PENDING  Incomplete  Wound culture     Status: None   Collection Time: 12/29/15  4:53 AM  Result Value  Ref Range Status   Specimen Description BUTTOCKS  Final   Special Requests NONE  Final   Gram Stain   Final    FEW WBC PRESENT,BOTH PMN AND MONONUCLEAR MODERATE SQUAMOUS EPITHELIAL CELLS PRESENT ABUNDANT GRAM VARIABLE ROD MODERATE GRAM POSITIVE COCCI IN PAIRS    Culture   Final    MODERATE GROUP B STREP(S.AGALACTIAE)ISOLATED Note: Beta hemolytic streptococci are predictably susceptible to penicillin and other beta lactams. Susceptibility testing not routinely performed. Performed at [facility]    Report Status 12/31/2015 FINAL  Final  MRSA PCR Screening     Status: None   Collection Time: 12/29/15  9:53 AM  Result Value Ref Range Status   MRSA by PCR NEGATIVE NEGATIVE Final    Comment:        The GeneXpert MRSA Assay (FDA approved for NASAL specimens only), is one component of a comprehensive MRSA colonization surveillance program. It is not intended to diagnose MRSA infection nor to guide or monitor treatment for MRSA infections.   Surgical pcr screen     Status: None   Collection Time: 12/31/15  3:59 PM  Result Value Ref Range Status   MRSA, PCR NEGATIVE NEGATIVE Final   Staphylococcus aureus NEGATIVE NEGATIVE Final    Comment:        The Xpert  SA Assay (FDA approved for NASAL specimens in patients over 81 years of age), is one component of a comprehensive surveillance program.  Test performance has been validated by [facility] for patients greater than or equal to 72 year old. It is not intended to diagnose infection nor to guide or monitor treatment.   Anaerobic culture     Status: None (Preliminary result)   Collection Time: 01/01/16  9:21 AM  Result Value Ref Range Status   Specimen Description ABSCESS BUTTOCKS  Final   Special Requests NONE  Final   Gram Stain   Final    ABUNDANT WBC PRESENT, PREDOMINANTLY PMN NO SQUAMOUS EPITHELIAL CELLS SEEN ABUNDANT GRAM POSITIVE COCCI IN PAIRS IN CLUSTERS IN CHAINS ABUNDANT GRAM NEGATIVE RODS Performed  at [facility]    Culture PENDING  Incomplete   Report Status PENDING  Incomplete    Medical/Surgical History: Past Medical History  Diagnosis Date  . HTN (hypertension) 06/04/2012  . Hyperlipidemia 06/04/2012  . Diabetes mellitus (HCC) 06/04/2012  . HSV-2 infection   . DKA (diabetic ketoacidoses) (HCC)   . Acute respiratory failure [facility])    Past Surgical History  Procedure Laterality Date  . Irrigation and debridement abscess  10/31/2012    Procedure: IRRIGATION AND DEBRIDEMENT ABSCESS;  Surgeon: Cherylynn Ridges, MD;  Location: [facility] OR;  Service: General;  Laterality: Bilateral;  . Cesarean section N/A 08/08/2013    Procedure: CESAREAN SECTION;  Surgeon: Tereso Newcomer, MD;  Location: [facility] ORS;  Service: Obstetrics;  Laterality: N/A;    Current Medication[s] Include: Prior to Admission: Prescriptions prior to admission  Medication Sig Dispense Refill Last Dose  . insulin NPH-regular Human (NOVOLIN 70/30) (70-30) 100 UNIT/ML injection Inject 50 Units into the skin 2 (two) times daily with a meal.   12/28/2015 at Unknown time  . lisinopril (PRINIVIL,ZESTRIL) 40 MG tablet Take 1 tablet (40 mg total) by mouth daily. 30 tablet 0 12/28/2015 at Unknown time  . atorvastatin (LIPITOR) 40 MG tablet Take 1 tablet (40 mg total) by mouth daily at 6 PM. 30 tablet 11 Past Month at Unknown time  . glucose blood (ACCU-CHEK SMARTVIEW) test strip Check sugar 3 times a day 100 each 12 N/A  . insulin aspart protamine- aspart (NOVOLOG MIX 70/30) (70-30) 100 UNIT/ML injection Inject 0.3 mLs (30 Units total) into the skin 2 (two) times daily with a meal. 10 mL 11    Scheduled:  Scheduled:  . docusate sodium  100 mg Oral BID  . [START ON 01/02/2016] heparin  5,000 Units Subcutaneous 3 times per day  . insulin aspart  0-20 Units Subcutaneous TID WC  . insulin aspart  0-5 Units Subcutaneous QHS  . insulin aspart  10 Units Subcutaneous TID WC  . insulin glargine  45 Units Subcutaneous Daily  .  metronidazole  500 mg Intravenous Q8H  . pantoprazole  40 mg Oral Daily  . polyethylene glycol  17 g Oral Daily  . potassium chloride  40 mEq Oral BID   Infusion[s]: Infusions:  . sodium chloride 1,000 mL (12/30/15 1759)   Antibiotic[s]: Anti-infectives    Start     Dose/Rate Route Frequency Ordered Stop   01/01/16 1830  metroNIDAZOLE (FLAGYL) IVPB 500 mg     500 mg 100 mL/hr over 60 Minutes Intravenous Every 8 hours 01/01/16 1736     12/31/15 1032  levofloxacin (LEVAQUIN) tablet 500 mg  Status:  Discontinued     500 mg Oral Daily 12/31/15 1020 01/01/16 1736  12/30/15 0000  vancomycin (VANCOCIN) IVPB 1000 mg/200 mL premix  Status:  Discontinued     1,000 mg 200 mL/hr over 60 Minutes Intravenous Every 24 hours 12/29/15 0137 12/29/15 1033   12/29/15 1400  clindamycin (CLEOCIN) capsule 300 mg  Status:  Discontinued     300 mg Oral 3 times per day 12/29/15 1031 01/01/16 1735   12/29/15 1045  sulfamethoxazole-trimethoprim (BACTRIM DS,SEPTRA DS) 800-160 MG per tablet 1 tablet  Status:  Discontinued     1 tablet Oral Every 12 hours 12/29/15 1031 12/29/15 1032   12/29/15 1045  ciprofloxacin (CIPRO) tablet 500 mg  Status:  Discontinued     500 mg Oral 2 times daily 12/29/15 1032 12/31/15 1019   12/29/15 0200  metroNIDAZOLE (FLAGYL) IVPB 500 mg  Status:  Discontinued     500 mg 100 mL/hr over 60 Minutes Intravenous Every 8 hours 12/29/15 0137 12/29/15 1033   12/29/15 0145  vancomycin (VANCOCIN) IVPB 1000 mg/200 mL premix  Status:  Discontinued     1,000 mg 200 mL/hr over 60 Minutes Intravenous  Once 12/29/15 0137 12/29/15 1033   12/29/15 0145  cefTRIAXone (ROCEPHIN) 1 g in dextrose 5 % 50 mL IVPB  Status:  Discontinued     1 g 100 mL/hr over 30 Minutes Intravenous Daily at bedtime 12/29/15 0137 12/29/15 1033   12/28/15 2330  clindamycin (CLEOCIN) IVPB 600 mg     600 mg 100 mL/hr over 30 Minutes Intravenous  Once 12/28/15 2319 12/28/15 2341      Assessment:  35 y/o female s/p I & D  bilateral gluteal abscesses x 3 today who is to be placed on Vancomycin per Pharmacy consult.  Patient is also to receive IV Flagyl.  Estimated CrCl ~ 78 ml/min.  Scr 1.13.   WBC 21.4  Goal of Therapy:   Vancomycin trough level 10-15 mcg/ml  Plan:  1. Continue Flagyl as ordered.  No adjustments required. 2. Begin Vancomycin 1 gm IV q 12 hours. 3. Monitor renal function, WBC, fever curve, any cultures/sensitivities, Vancomycin levels as clinically indicated, length of therapy, and follow clinical progression.  Crystina Borrayo, Colin Benton,  Pharm.D,    01/01/2016    6:12 PM

## 2016-01-01 NOTE — Interval H&P Note (Signed)
History and Physical Interval Note:  01/01/2016 8:41 AM  Rebecca Rhodes  has presented today for surgery, with the diagnosis of bilateral gluteal abscesses  The various methods of treatment have been discussed with the patient and family. After consideration of risks, benefits and other options for treatment, the patient has consented to  Procedure(s): INCISION AND DRAINAGE OF LEFT AND RIGHT GLUTEAL ABSCESS (Bilateral) as a surgical intervention .  The patient's history has been reviewed, patient examined, no change in status, stable for surgery.  I have reviewed the patient's chart and labs.  Questions were answered to the patient's satisfaction.    Also discussed yesterday with pt about potential risk of injury to surrounding structures, changes in sensation.   Mary Sella. Andrey Campanile, MD, FACS General, Bariatric, & Minimally Invasive Surgery [facility], [place]   [facility]

## 2016-01-01 NOTE — Progress Notes (Signed)
Noted that patient takes 70/30 insulin at home since her health insurance is not confirmed as Medicaid. Will need to be able to get insulin until her insurance is confirmed. Takes Novolin Reli-on 70/30 50 units BID at home. Usually takes only once per day. Will continue to follow blood sugars while in the hospital. Smith Mince RN BSN CDE

## 2016-01-02 LAB — GLUCOSE, CAPILLARY
GLUCOSE-CAPILLARY: 245 mg/dL — AB (ref 65–99)
GLUCOSE-CAPILLARY: 281 mg/dL — AB (ref 65–99)
Glucose-Capillary: 243 mg/dL — ABNORMAL HIGH (ref 65–99)
Glucose-Capillary: 279 mg/dL — ABNORMAL HIGH (ref 65–99)

## 2016-01-02 LAB — BASIC METABOLIC PANEL
ANION GAP: 6 (ref 5–15)
CO2: 20 mmol/L — AB (ref 22–32)
Calcium: 8.1 mg/dL — ABNORMAL LOW (ref 8.9–10.3)
Chloride: 110 mmol/L (ref 101–111)
Creatinine, Ser: 1 mg/dL (ref 0.44–1.00)
GFR calc Af Amer: >60 mL/min (ref >60)
GLUCOSE: 283 mg/dL — AB (ref 65–99)
POTASSIUM: 4 mmol/L (ref 3.5–5.1)
Sodium: 136 mmol/L (ref 135–145)

## 2016-01-02 LAB — CBC
HEMATOCRIT: 29.3 % — AB (ref 36.0–46.0)
HEMOGLOBIN: 9.7 g/dL — AB (ref 12.0–15.0)
MCH: 28.9 pg (ref 26.0–34.0)
MCHC: 33.1 g/dL (ref 30.0–36.0)
MCV: 87.2 fL (ref 78.0–100.0)
Platelets: 289 10*3/uL (ref 150–400)
RBC: 3.36 MIL/uL — ABNORMAL LOW (ref 3.87–5.11)
RDW: 15 % (ref 11.5–15.5)
WBC: 17.4 10*3/uL — ABNORMAL HIGH (ref 4.0–10.5)

## 2016-01-02 LAB — TROPONIN I
TROPONIN I: 0.1 ng/mL — AB (ref <0.031)
Troponin I: 0.05 ng/mL — ABNORMAL HIGH (ref <0.031)
Troponin I: 0.04 ng/mL — ABNORMAL HIGH (ref <0.031)

## 2016-01-02 MED ORDER — ASPIRIN 325 MG PO TABS: 325.0000 mg | ORAL_TABLET | Freq: Every day | ORAL | Status: DC

## 2016-01-02 MED ORDER — MORPHINE SULFATE (PF) 2 MG/ML IV SOLN
1.0000 mg | INTRAVENOUS | Status: DC | PRN
  Administered 2016-01-02 – 2016-01-05 (×10): 2 mg via INTRAVENOUS
  Filled 2016-01-02 (×10): qty 1

## 2016-01-02 MED ORDER — ASPIRIN 325 MG PO TABS
325.0000 mg | ORAL_TABLET | Freq: Once | ORAL | Status: AC
  Administered 2016-01-02: 325 mg via ORAL
  Filled 2016-01-02: qty 1

## 2016-01-02 MED ORDER — INSULIN GLARGINE 100 UNIT/ML ~~LOC~~ SOLN
55.0000 [IU] | Freq: Every day | SUBCUTANEOUS | Status: DC
  Administered 2016-01-02 – 2016-01-03 (×2): 55 [IU] via SUBCUTANEOUS
  Filled 2016-01-02 (×3): qty 0.55

## 2016-01-02 MED ORDER — POTASSIUM CHLORIDE CRYS ER 20 MEQ PO TBCR
40.0000 meq | EXTENDED_RELEASE_TABLET | Freq: Every day | ORAL | Status: DC
  Administered 2016-01-02: 40 meq via ORAL
  Filled 2016-01-02: qty 2

## 2016-01-02 MED ORDER — VANCOMYCIN HCL 10 G IV SOLR
1250.0000 mg | Freq: Two times a day (BID) | INTRAVENOUS | Status: DC
  Administered 2016-01-02: 1250 mg via INTRAVENOUS
  Filled 2016-01-02 (×3): qty 1250

## 2016-01-02 MED ORDER — SENNOSIDES-DOCUSATE SODIUM 8.6-50 MG PO TABS
2.0000 | ORAL_TABLET | Freq: Two times a day (BID) | ORAL | Status: DC
  Administered 2016-01-02 – 2016-01-05 (×6): 2 via ORAL
  Filled 2016-01-02 (×7): qty 2

## 2016-01-02 NOTE — Anesthesia Postprocedure Evaluation (Signed)
Anesthesia Post Note  Patient: Rebecca Rhodes  Procedure(s) Performed: Procedure(s) (LRB): INCISION AND DRAINAGE OF BILATERAL GLUTEAL ABSCESSES TIMES THREE (Bilateral)  Patient location during evaluation: PACU Anesthesia Type: General Level of consciousness: awake and alert Pain management: pain level controlled Vital Signs Assessment: post-procedure vital signs reviewed and stable Respiratory status: spontaneous breathing, nonlabored ventilation, respiratory function stable and patient connected to nasal cannula oxygen Cardiovascular status: blood pressure returned to baseline and stable Postop Assessment: no signs of nausea or vomiting Anesthetic complications: no    Last Vitals:  Filed Vitals:   01/02/16 0147 01/02/16 0606  BP: 135/73 116/72  Pulse: 123 106  Temp: 37.6 C 37.1 C  Resp: 16 16    Last Pain:  Filed Vitals:   01/02/16 0606  PainSc: 4                  Eron Goble DAVID

## 2016-01-02 NOTE — Progress Notes (Signed)
Pharmacy Antibiotic Follow-up Note  Rebecca Rhodes is a 35 y.o. year-old female admitted on 12/28/2015.  The patient is currently on day 2 of Vancomycin for bilateral buttock abscesses. The patient underwent I&D on 1/20 with intra-op cultures still pending. Post-op, the patient was transitioned to Vancomycin + Flagyl. Vancomycin requires a slight dose adjustment today is the setting of improving renal function.   Assessment/Plan: 1. Adjust Vancomycin to 1250 mg IV every 12 hours 2. Will continue to follow renal function, culture results, LOT, and antibiotic de-escalation plans   Temp (24hrs), Avg:99.1 F (37.3 C), Min:98.8 F (37.1 C), Max:99.6 F (37.6 C)   Recent Labs Lab 12/28/15 1724 01/01/16 1235 01/02/16 0551  WBC 18.0* 21.4* 17.4*    Recent Labs Lab 12/31/15 0523 01/01/16 1235 01/01/16 1647 01/01/16 2210 01/02/16 0551  CREATININE 1.07* 1.26* 1.13* 0.89 1.00   Estimated Creatinine Clearance: 88.3 mL/min (by C-G formula based on Cr of 1).    Allergies  Allergen Reactions  . Penicillins Rash    Antimicrobials this admission: Clinda  1/16 >> 1/20 Cipro 1/17  >>  1/19 Levaquin 1/19  >>  1/19 Vanc  1/20  >>   Flagyl 1/20 >>   Levels/dose changes this admission: n/a  Microbiology results: 1/17 MRSA PCR >> neg 1/17 Buttocks wound cx >> moderate group B strep 1/17 BCx >> ngtd 1/20 Abscess cx (I&D in the OR) >> pending  Thank you for allowing pharmacy to be a part of this patient's care.  Georgina Pillion, PharmD, BCPS Clinical Pharmacist Pager: [phone] 01/02/2016 11:32 AM

## 2016-01-02 NOTE — Progress Notes (Signed)
1 Day Post-Op  Subjective: She has a great deal of purulent drainage coming from the wounds, the left is much worse than the right.  Pad was soaked with drainage, coming out around the packing.  I took the dressing down and cleaned her up, she is sluffing skin between the two sites on the left.  She has marked drainage with removal of the iodoform packing.    Objective: Vital signs in last 24 hours: Temp:  [97.5 F (36.4 C)-99.6 F (37.6 C)] 98.8 F (37.1 C) (01/21 0606) Pulse Rate:  [98-123] 106 (01/21 0606) Resp:  [16-24] 16 (01/21 0606) BP: (116-140)/(72-94) 116/72 mmHg (01/21 0606) SpO2:  [98 %-100 %] 98 % (01/21 0606) Last BM Date: 12/29/15  Intake/Output from previous day: 01/20 0701 - 01/21 0700 In: 2704.2 [P.O.:300; I.V.:2004.2; IV Piggyback:400] Out: 830 [Urine:800; Blood:30] Intake/Output this shift:    General appearance: alert, cooperative, mild distress and from pain of removing packing. Skin: as note above allot of purulent drainage, losing skin between the 2 sites on the left, with large amout of drainage after packing is out. right side is a little better., but has a good deal of dranage also.  Lab Results:   Recent Labs  01/01/16 1235 01/02/16 0551  WBC 21.4* 17.4*  HGB 10.8* 9.7*  HCT 32.8* 29.3*  PLT 330 289    BMET  Recent Labs  01/01/16 2210 01/02/16 0551  NA 135 136  K 3.9 4.0  CL 106 110  CO2 18* 20*  GLUCOSE 339* 283*  BUN <5* <5*  CREATININE 0.89 1.00  CALCIUM 8.3* 8.1*   PT/INR No results for input(s): LABPROT, INR in the last 72 hours.  No results for input(s): AST, ALT, ALKPHOS, BILITOT, PROT, ALBUMIN in the last 168 hours.   Lipase     Component Value Date/Time   LIPASE 46 10/24/2012 1137     Studies/Results: US Pelvis Limited  12/31/2015  CLINICAL DATA:  Left and right buttock abscesses for 1 week. History of hypertension diabetes. Patient reports drainage from the right buttock abscess. EXAM: LIMITED ULTRASOUND OF PELVIS  TECHNIQUE: Limited transabdominal ultrasound examination of the pelvis was performed. COMPARISON:  None. FINDINGS: Ultrasound is performed of the medial gluteal regions bilaterally. Right: There is diffuse subcutaneous edema. Small focal hypoechoic collection is 1.3 x 0.5 x 1.2 cm. Left: Subcutaneous edema. Complex hypoechoic collection is 3.6 x 2.3 x 3.7 cm. IMPRESSION: 1. Significant edema without drainable abscess involving the right buttock region. 2. 3.6 x 2.3 x 3.7 cm abscess in the left buttock. Electronically Signed   By: Norva Pavlov M.D.   On: 12/31/2015 13:31    Medications: . docusate sodium  100 mg Oral BID  . heparin  5,000 Units Subcutaneous 3 times per day  . insulin aspart  0-20 Units Subcutaneous TID WC  . insulin aspart  0-5 Units Subcutaneous QHS  . insulin aspart  10 Units Subcutaneous TID WC  . insulin glargine  55 Units Subcutaneous Daily  . metronidazole  500 mg Intravenous Q8H  . pantoprazole  40 mg Oral Daily  . polyethylene glycol  17 g Oral Daily  . potassium chloride  40 mEq Oral Daily  . vancomycin  1,000 mg Intravenous Q12H   Culture:    FEW WBC PRESENT,BOTH PMN AND MONONUCLEAR  MODERATE SQUAMOUS EPITHELIAL CELLS PRESENT  ABUNDANT GRAM VARIABLE ROD  MODERATE GRAM POSITIVE COCCI  IN PAIRS       Culture MODERATE GROUP B STREP(S.AGALACTIAE)ISOLATED  Note: Beta  hemolytic streptococci are predictably susceptible to penicillin and other beta lactams. Susceptibility testing not routinely performed.  Performed at [facility]             Assessment/Plan Bilateral gluteal abscesses, 2 on left and one on the right S/p Incision, drainage, and debridement of bilateral gluteal abscesses x3, 01/01/16, Dr. Gaynelle Adu.  POD #1 DKA AODM Body mass index is 36.9 Antibiotics:  She has been treated with levofloxacin x1 day, Cipro/Clindamycin x 4 days, converted to Flagyl/Vancomycin post op 01/01/16 DVT:  Heparin/SCD  Plan:  I am going to get her into a  Sitz bath, and shower, then have her repacked.  She has been lying in this stuff at least all morning.  Dressing changes BID and PRN with BM.  Will watch and see how she does.         LOS: 4 days    Rebecca Rhodes 01/02/2016

## 2016-01-02 NOTE — Progress Notes (Signed)
Family Medicine Teaching Service Daily Progress Note Intern Pager: [phone]  Patient name: Rebecca Rhodes Medical record number: [id number] Date of birth: June 22, 1981 Age: 35 y.o. Gender: female  Primary Care Provider: Tawni Carnes, MD Consultants: none Code Status: full  Pt Overview and Major Events to Date:  1/17: admitted with DKA 1/20: I&D of buttocks abscesses  Assessment and Plan: Rebecca Rhodes is a 35 y.o. female presenting with DKA . PMH is significant for T2DM, HTN, HLD.  DKA, resolved: Went back into DKA yesterday afternoon after her surgery, but gap closed last night. This morning, AG 6, CO2 20. CBG 243 this am. - Cardiac monitoring -  Will increase Lantus from 45 units to 55 units today. Continue Novolog 10 units tid with meals + resistant SSI.  - Will discharge on Novolin 70/30. - Consult to care management for medication needs. Pt is applying for Medicaid and will continue Novolin 70/30 until she gets Medicaid.  DM-2, poorly controlled: A1c 16.2%. Prescribed Novolin 70/30 50 units bid, but has been taking it daily because she can't afford it. - Continue management as above - Will adjust home dose on discharge  Buttock abscess, s/p I&D on 1/20: Pain improved this morning. - Per surgery note, will get Sitz bath and shower today and then will be repacked. Dressing changes BID and PRN with BMs. - Continue Vancomycin and Flagyl. Plan to transition to orals tomorrow. - Initial wound culture growing moderate Group B strep - Will follow-up wound cultures taken during I&D - May need home health wound care  Atypical chest pain: Pt endorsing new substernal "dull" chest pain that comes and goes. She states it feels like "gas pain". - Will get an EKG and troponin x 1 to rule out cardiac origin - Will start her on Miralax and Senokot, as she has not had a BM in 4 days. - Continue to monitor.  Hypokalemia: K 4.0 this morning. - K-Dur daily - Daily BMET  AKI, improved. Cr  2.48 on admission (b/l 0.8-0.9) > 1.00 this morning. Bumped slightly to 1.26 yesterday morning, likely because she was NPO for surgery. - IVFs as above - Avoid nephrotoxic agents - Daily BMPs  HTN, improved. BP 116/72 this morning. Takes Lisinopril  daily at home. - Hydralazine  prn SBP>180, DBP>110 - Will restart Lisinopril at  today - Follow-up as an outpatient.  FEN/GI: - Heart healthy, carb-modified diet - MIVFs for now, will d/c if good PO intake today - Protonix - Bowel regimen: Miralax and Senokot 2 tablets bid]  Prophylaxis: Sub-q heparin  Disposition: Anticipate discharge home tomorrow, pending blood sugars.  Subjective:  Pt endorsing new substernal "dull" chest pain that comes and goes. She states it feels like "gas pain". She has not had a bowel movement in 4 days. No other concerns this morning.  Objective: Temp:  [97.5 F (36.4 C)-99.6 F (37.6 C)] 98.8 F (37.1 C) (01/21 0606) Pulse Rate:  [98-123] 106 (01/21 0606) Resp:  [16-24] 16 (01/21 0606) BP: (116-140)/(72-94) 116/72 mmHg (01/21 0606) SpO2:  [98 %-100 %] 98 % (01/21 0606) Physical Exam:  General: awake, alert Eyes: sclera Bolin, PERRL, EOMI ENTM: MMM Cardiovascular: Tachycardic, regular rhythm, no murmurs Respiratory: normal breathing, CTAB,speaks in full sentences Abdomen: obese, soft, NT/ND, +BS MSK: no edema, moves all extremities Skin: No rashes or lesions, bandage present over buttocks.  Laboratory:  Recent Labs Lab 12/28/15 1724 01/01/16 1235 01/02/16 0551  WBC 18.0* 21.4* 17.4*  HGB 12.5 10.8* 9.7*  HCT 39.4 32.8*  29.3*  PLT 311 330 289    Recent Labs Lab 01/01/16 1647 01/01/16 2210 01/02/16 0551  NA 135 135 136  K 4.2 3.9 4.0  CL 104 106 110  CO2 15* 18* 20*  BUN <5* <5* <5*  CREATININE 1.13* 0.89 1.00  CALCIUM 8.3* 8.3* 8.1*  GLUCOSE 420* 339* 283*   Hgb A1c: 16.2%  Imaging/Diagnostic Tests: No results found.  Campbell Stall, MD 01/02/2016, 8:24  AM PGY-1, [facility] Family Medicine FPTS Intern pager: [phone], text pages welcome

## 2016-01-02 NOTE — Progress Notes (Signed)
**  Late entry note**  Stopped by patient's room earlier this afternoon.  She reports that chest pain has completely resolved.  Denies nausea, vomiting, dizziness, diaphoresis, visual disturbances.  Patient is well appearing, NAD, sitting up in bed, breathing well on RA.  CBG (last 3)   Recent Labs  01/02/16 0800 01/02/16 1210 01/02/16 1641  GLUCAP 243* 279* 245*   Cardiac Panel (last 3 results)  Recent Labs  01/02/16 1116 01/02/16 1628  TROPONINI 0.05* 0.04*    EKG was nonischemic.  Troponin down trending.  Suspect demand ischemia given recent DKA with Kussmaul breathing and buttock abscess.  Blood glucose continues to run in mid 200's.  Lantus was increased to 55u today.  Home lisinopril also restarted.  BPs still slightly elevated at 145/92. - Will consider c/s cards in am if troponins do not continue to downtrend - Will continue to monitor BGs  Bella Brummet M. Nadine Counts, DO PGY-2, [facility]

## 2016-01-02 NOTE — Progress Notes (Signed)
**  Interval Note**  Called Cards Fellow, Dr Armanda Magic, regarding patient's rising troponin.  0.05>0.04>0.10.  She reviewed the EKG.  Given patient's age and history, there is low suspicion that troponin elevation is due to MI.  More likely demand ischemia.  She recommends continuing to cycle troponins until they peak and obtaining an echocardiogram.  Reasonable to c/s cards in am.  Tests ordered.  Per patient CP resolved earlier in am.  Plan for EKG in am.  Will continue to monitor.  Ashly M. Nadine Counts, DO PGY-2, [facility]

## 2016-01-03 DIAGNOSIS — R7989 Other specified abnormal findings of blood chemistry: Secondary | ICD-10-CM | POA: Insufficient documentation

## 2016-01-03 DIAGNOSIS — R778 Other specified abnormalities of plasma proteins: Secondary | ICD-10-CM | POA: Insufficient documentation

## 2016-01-03 DIAGNOSIS — R079 Chest pain, unspecified: Secondary | ICD-10-CM | POA: Insufficient documentation

## 2016-01-03 LAB — BASIC METABOLIC PANEL
Anion gap: 10 (ref 5–15)
CALCIUM: 8.1 mg/dL — AB (ref 8.9–10.3)
CO2: 19 mmol/L — AB (ref 22–32)
CREATININE: 0.79 mg/dL (ref 0.44–1.00)
Chloride: 108 mmol/L (ref 101–111)
GFR calc non Af Amer: >60 mL/min (ref >60)
Glucose, Bld: 250 mg/dL — ABNORMAL HIGH (ref 65–99)
Potassium: 3.4 mmol/L — ABNORMAL LOW (ref 3.5–5.1)
SODIUM: 137 mmol/L (ref 135–145)

## 2016-01-03 LAB — CBC
HCT: 28.6 % — ABNORMAL LOW (ref 36.0–46.0)
Hemoglobin: 9.5 g/dL — ABNORMAL LOW (ref 12.0–15.0)
MCH: 28.8 pg (ref 26.0–34.0)
MCHC: 33.2 g/dL (ref 30.0–36.0)
MCV: 86.7 fL (ref 78.0–100.0)
PLATELETS: 291 10*3/uL (ref 150–400)
RBC: 3.3 MIL/uL — AB (ref 3.87–5.11)
RDW: 15 % (ref 11.5–15.5)
WBC: 13.3 10*3/uL — ABNORMAL HIGH (ref 4.0–10.5)

## 2016-01-03 LAB — CULTURE, BLOOD (ROUTINE X 2)
CULTURE: NO GROWTH
CULTURE: NO GROWTH

## 2016-01-03 LAB — GLUCOSE, CAPILLARY
GLUCOSE-CAPILLARY: 226 mg/dL — AB (ref 65–99)
Glucose-Capillary: 239 mg/dL — ABNORMAL HIGH (ref 65–99)
Glucose-Capillary: 278 mg/dL — ABNORMAL HIGH (ref 65–99)
Glucose-Capillary: 251 mg/dL — ABNORMAL HIGH (ref 65–99)

## 2016-01-03 LAB — TROPONIN I
TROPONIN I: 0.05 ng/mL — AB (ref <0.031)
Troponin I: 0.04 ng/mL — ABNORMAL HIGH (ref <0.031)
Troponin I: 0.08 ng/mL — ABNORMAL HIGH (ref <0.031)
Troponin I: <0.03 ng/mL (ref <0.031)

## 2016-01-03 MED ORDER — POTASSIUM CHLORIDE CRYS ER 20 MEQ PO TBCR
40.0000 meq | EXTENDED_RELEASE_TABLET | Freq: Two times a day (BID) | ORAL | Status: AC
  Administered 2016-01-03 (×2): 40 meq via ORAL
  Filled 2016-01-03 (×2): qty 2

## 2016-01-03 MED ORDER — CLINDAMYCIN HCL 300 MG PO CAPS
450.0000 mg | ORAL_CAPSULE | Freq: Four times a day (QID) | ORAL | Status: DC
  Administered 2016-01-03 – 2016-01-05 (×9): 450 mg via ORAL
  Filled 2016-01-03 (×15): qty 1

## 2016-01-03 MED ORDER — INSULIN ASPART 100 UNIT/ML ~~LOC~~ SOLN
12.0000 [IU] | Freq: Three times a day (TID) | SUBCUTANEOUS | Status: DC
  Administered 2016-01-03 – 2016-01-05 (×6): 12 [IU] via SUBCUTANEOUS

## 2016-01-03 MED ORDER — ATORVASTATIN CALCIUM 40 MG PO TABS
40.0000 mg | ORAL_TABLET | Freq: Every day | ORAL | Status: DC
  Administered 2016-01-03 – 2016-01-04 (×2): 40 mg via ORAL
  Filled 2016-01-03: qty 1

## 2016-01-03 MED ORDER — FLUCONAZOLE 100 MG PO TABS
150.0000 mg | ORAL_TABLET | Freq: Every day | ORAL | Status: DC
  Administered 2016-01-03 – 2016-01-05 (×3): 150 mg via ORAL
  Filled 2016-01-03 (×3): qty 2

## 2016-01-03 MED ORDER — LISINOPRIL 20 MG PO TABS
20.0000 mg | ORAL_TABLET | Freq: Every day | ORAL | Status: DC
  Administered 2016-01-03: 20 mg via ORAL
  Filled 2016-01-03: qty 1

## 2016-01-03 MED ORDER — SODIUM CHLORIDE 0.9 % IV SOLN: 1000.0000 mL | INTRAVENOUS | Status: DC

## 2016-01-03 MED ORDER — SODIUM CHLORIDE 0.9 % IV SOLN
1000.0000 mL | INTRAVENOUS | Status: DC
  Administered 2016-01-03 – 2016-01-04 (×3): 1000 mL via INTRAVENOUS

## 2016-01-03 MED ORDER — CLINDAMYCIN HCL 300 MG PO CAPS: 600.0000 mg | ORAL_CAPSULE | Freq: Three times a day (TID) | ORAL | Status: DC

## 2016-01-03 NOTE — Consult Note (Signed)
CARDIOLOGY CONSULT NOTE     Primary Care Physician: Tawni Carnes, MD Referring Physician:  Admit Date: 12/28/2015  Reason for consultation:  Rebecca Rhodes is a 35 y.o. female with a h/o DM, hypertension, hyperlipidemia who presented for buttock abscess and was found to be in DKA.  She was not able to afford her medications and went into DKA.  On 1/21, began complaining on dull chest pain that comes and goes.  She feels that this is due to gas pain.  Troponins have been 0.05>0.04>0.1>0.04.  She says that she has been having similar chest discomfort for the past few weeks.  The discomfort is a dull ache in the center of her chest that does not radiate.  It occurs when she is at rest and during exertion.  She says that sometime it is improved with belching, but at other times this does not help.  She has had one episode of this during this admission which resolved spontaneously.  She did not have an ECG during the discomfort, but does have new T wave inversions on her ECG.  Today, she denies symptoms of palpitations, chest pain, shortness of breath, orthopnea, PND, lower extremity edema, dizziness, presyncope, syncope, or neurologic sequela. The patient is tolerating medications without difficulties and is otherwise without complaint today.   Past Medical History  Diagnosis Date  . HTN (hypertension) 06/04/2012  . Hyperlipidemia 06/04/2012  . Diabetes mellitus (HCC) 06/04/2012  . HSV-2 infection   . DKA (diabetic ketoacidoses) (HCC)   . Acute respiratory failure [facility])    Past Surgical History  Procedure Laterality Date  . Irrigation and debridement abscess  10/31/2012    Procedure: IRRIGATION AND DEBRIDEMENT ABSCESS;  Surgeon: Cherylynn Ridges, MD;  Location: [facility] OR;  Service: General;  Laterality: Bilateral;  . Cesarean section N/A 08/08/2013    Procedure: CESAREAN SECTION;  Surgeon: Tereso Newcomer, MD;  Location: [facility] ORS;  Service: Obstetrics;  Laterality: N/A;    . heparin  5,000 Units  Subcutaneous 3 times per day  . insulin aspart  0-20 Units Subcutaneous TID WC  . insulin aspart  0-5 Units Subcutaneous QHS  . insulin aspart  10 Units Subcutaneous TID WC  . insulin glargine  55 Units Subcutaneous Daily  . metronidazole  500 mg Intravenous Q8H  . pantoprazole  40 mg Oral Daily  . polyethylene glycol  17 g Oral Daily  . potassium chloride  40 mEq Oral Daily  . senna-docusate  2 tablet Oral BID  . vancomycin  1,250 mg Intravenous Q12H   . sodium chloride 1,000 mL (01/03/16 0408)    Allergies  Allergen Reactions  . Penicillins Rash    Social History   Social History  . Marital Status: Single    Spouse Name: N/A  . Number of Children: N/A  . Years of Education: N/A   Occupational History  . Not on file.   Social History Main Topics  . Smoking status: Never Smoker   . Smokeless tobacco: Never Used  . Alcohol Use: No  . Drug Use: No  . Sexual Activity: Yes    Birth Control/ Protection: None   Other Topics Concern  . Not on file   Social History Narrative    Family History  Problem Relation Age of Onset  . Hypertension Mother   . Stroke Mother   . Heart disease Father   . Diabetes Father   . Cancer Maternal Grandfather   . Hypertension Brother   . Diabetes Paternal Aunt   .  Diabetes Paternal Uncle   . Diabetes Paternal Grandmother   . Diabetes Paternal Grandfather     ROS- All systems are reviewed and negative except as per the HPI above  Physical Exam: Telemetry: Filed Vitals:   01/02/16 1444 01/02/16 2032 01/03/16 0018 01/03/16 0443  BP: 145/92 129/78 118/75 130/83  Pulse: 110 116 108 104  Temp: 98.9 F (37.2 C) 99.2 F (37.3 C) 99 F (37.2 C) 98.8 F (37.1 C)  TempSrc: Oral Oral Oral Oral  Resp:  Height:      Weight:      SpO2: 99% 100% 100% 97%    GEN- The patient is well appearing, alert and oriented x 3 today.   Head- normocephalic, atraumatic Eyes-  Sclera clear, conjunctiva pink Ears- hearing  intact Oropharynx- clear Neck- supple, no JVP Lymph- no cervical lymphadenopathy Lungs- Clear to ausculation bilaterally, normal work of breathing Heart- Regular rate and rhythm, no murmurs, rubs or gallops, PMI not laterally displaced GI- soft, NT, ND, + BS Extremities- no clubbing, cyanosis, or edema MS- no significant deformity or atrophy Skin- no rash or lesion Psych- euthymic mood, full affect Neuro- strength and sensation are intact  EKG: sinus rhythm, TWI in inferior leads  Labs:   Lab Results  Component Value Date   WBC 13.3* 01/03/2016   HGB 9.5* 01/03/2016   HCT 28.6* 01/03/2016   MCV 86.7 01/03/2016   PLT 291 01/03/2016    Recent Labs Lab 01/03/16 0524  NA 137  K 3.4*  CL 108  CO2 19*  BUN <5*  CREATININE 0.79  CALCIUM 8.1*  GLUCOSE 250*   Lab Results  Component Value Date   CKTOTAL 47 06/05/2012   CKMB 1.2 06/05/2012   TROPONINI 0.04* 01/03/2016    Lab Results  Component Value Date   CHOL 232* 06/05/2012   CHOL  04/12/2011    118        ATP III CLASSIFICATION:  <200     mg/dL   Desirable  960-454  mg/dL   Borderline High  >=098    mg/dL   High          CHOL 119 04/21/2010   Lab Results  Component Value Date   HDL 60 06/05/2012   HDL 41 04/12/2011   HDL 50 04/21/2010   Lab Results  Component Value Date   LDLCALC 148* 06/05/2012   LDLCALC  04/12/2011    64        Total Cholesterol/HDL:CHD Risk Coronary Heart Disease Risk Table                     Men   Women  1/2 Average Risk   3.4   3.3  Average Risk       5.0   4.4  2 X Average Risk   9.6   7.1  3 X Average Risk  23.4   11.0        Use the calculated Patient Ratio above and the CHD Risk Table to determine the patient's CHD Risk.        ATP III CLASSIFICATION (LDL):  <100     mg/dL   Optimal  147-829  mg/dL   Near or Above                    Optimal  130-159  mg/dL   Borderline  562-130  mg/dL   High  >865     mg/dL  Very High   LDLCALC 102* 04/21/2010   Lab Results   Component Value Date   TRIG 161* 10/27/2012   TRIG 118 06/05/2012   TRIG 63 04/12/2011   Lab Results  Component Value Date   CHOLHDL 3.9 06/05/2012   CHOLHDL 2.9 04/12/2011   CHOLHDL 3.3 Ratio 04/21/2010   No results found for: LDLDIRECT    Echo: pending  ASSESSMENT AND PLAN:   Chest pain: atypical in nature but she does have new T wave inversions in her inferior leads with a mildly elevated troponin.  This could all be due to her DKA, but with TWI, it is possible that this uncontrolled diabetic could have CAD.  Rebecca Rhodes get a nuclear stress test tomorrow to rule out ischemia.  Rebecca Kappes Jorja Loa, MD 01/03/2016  8:20 AM

## 2016-01-03 NOTE — Progress Notes (Signed)
2 Days Post-Op  Subjective: Patient is resting comfortably  Objective: Vital signs in last 24 hours: Temp:  [98.8 F (37.1 C)-99.2 F (37.3 C)] 98.8 F (37.1 C) (01/22 0443) Pulse Rate:  [104-116] 104 (01/22 0443) Resp:  [16-18] 16 (01/22 0443) BP: (118-145)/(75-92) 130/83 mmHg (01/22 0443) SpO2:  [97 %-100 %] 97 % (01/22 0443) Last BM Date: 12/29/15  Intake/Output from previous day: 01/21 0701 - 01/22 0700 In: 2771.3 [P.O.:540; I.V.:1481.3; IV Piggyback:750] Out: -  Intake/Output this shift:    Abscesses still with some purulent drainage; some surrounding induration, but improved   Lab Results:   Recent Labs  01/02/16 0551 01/03/16 0524  WBC 17.4* 13.3*  HGB 9.7* 9.5*  HCT 29.3* 28.6*  PLT 289 291   BMET  Recent Labs  01/02/16 0551 01/03/16 0524  NA 136 137  K 4.0 3.4*  CL 110 108  CO2 20* 19*  GLUCOSE 283* 250*  BUN <5* <5*  CREATININE 1.00 0.79  CALCIUM 8.1* 8.1*   PT/INR No results for input(s): LABPROT, INR in the last 72 hours. ABG No results for input(s): PHART, HCO3 in the last 72 hours.  Invalid input(s): PCO2, PO2  Studies/Results: No results found.  Anti-infectives: Anti-infectives    Start     Dose/Rate Route Frequency Ordered Stop   01/02/16 2100  vancomycin (VANCOCIN) 1,250 mg in sodium chloride 0.9 % 250 mL IVPB     1,250 mg 166.7 mL/hr over 90 Minutes Intravenous Every 12 hours 01/02/16 1133     01/01/16 2000  vancomycin (VANCOCIN) IVPB 1000 mg/200 mL premix  Status:  Discontinued     1,000 mg 200 mL/hr over 60 Minutes Intravenous Every 12 hours 01/01/16 1818 01/02/16 1133   01/01/16 1830  metroNIDAZOLE (FLAGYL) IVPB 500 mg     500 mg 100 mL/hr over 60 Minutes Intravenous Every 8 hours 01/01/16 1736     12/31/15 1032  levofloxacin (LEVAQUIN) tablet 500 mg  Status:  Discontinued     500 mg Oral Daily 12/31/15 1020 01/01/16 1736   12/30/15 0000  vancomycin (VANCOCIN) IVPB 1000 mg/200 mL premix  Status:  Discontinued     1,000  mg 200 mL/hr over 60 Minutes Intravenous Every 24 hours 12/29/15 0137 12/29/15 1033   12/29/15 1400  clindamycin (CLEOCIN) capsule 300 mg  Status:  Discontinued     300 mg Oral 3 times per day 12/29/15 1031 01/01/16 1735   12/29/15 1045  sulfamethoxazole-trimethoprim (BACTRIM DS,SEPTRA DS) 800-160 MG per tablet 1 tablet  Status:  Discontinued     1 tablet Oral Every 12 hours 12/29/15 1031 12/29/15 1032   12/29/15 1045  ciprofloxacin (CIPRO) tablet 500 mg  Status:  Discontinued     500 mg Oral 2 times daily 12/29/15 1032 12/31/15 1019   12/29/15 0200  metroNIDAZOLE (FLAGYL) IVPB 500 mg  Status:  Discontinued     500 mg 100 mL/hr over 60 Minutes Intravenous Every 8 hours 12/29/15 0137 12/29/15 1033   12/29/15 0145  vancomycin (VANCOCIN) IVPB 1000 mg/200 mL premix  Status:  Discontinued     1,000 mg 200 mL/hr over 60 Minutes Intravenous  Once 12/29/15 0137 12/29/15 1033   12/29/15 0145  cefTRIAXone (ROCEPHIN) 1 g in dextrose 5 % 50 mL IVPB  Status:  Discontinued     1 g 100 mL/hr over 30 Minutes Intravenous Daily at bedtime 12/29/15 0137 12/29/15 1033   12/28/15 2330  clindamycin (CLEOCIN) IVPB 600 mg     600 mg 100 mL/hr  over 30 Minutes Intravenous  Once 12/28/15 2319 12/28/15 2341      Assessment/Plan: s/p Procedure(s): INCISION AND DRAINAGE OF BILATERAL GLUTEAL ABSCESSES TIMES THREE (Bilateral) Continue antibiotics/ dressing changes  LOS: 5 days    Rusty Villella K. 01/03/2016

## 2016-01-03 NOTE — Progress Notes (Signed)
Family Medicine Teaching Service Daily Progress Note Intern Pager: [phone]  Patient name: Rebecca Rhodes Medical record number: [id number] Date of birth: 1980/12/16 Age: 35 y.o. Gender: female  Primary Care Provider: Tawni Carnes, MD Consultants: none Code Status: full  Pt Overview and Major Events to Date:  1/17: admitted with DKA 1/20: I&D of buttocks abscesses 1/21: transition to PO abx  Assessment and Plan: Rebecca Rhodes is a 35 y.o. female presenting with DKA . PMH is significant for T2DM, HTN, HLD.  DKA, resolved: Went back into DKA after her surgery, but gap closed 1/21- Cardiac monitoring -  Lantus 55 units today. Inc Novolog 12 units tid with meals + resistant SSI.  - Will discharge on Novolin 70/30. - Consult to care management for medication needs. Pt is applying for Medicaid and will continue Novolin 70/30 until she gets Medicaid.  DM-2, poorly controlled: A1c 16.2%. Prescribed Novolin 70/30 50 units bid, but has been taking it daily because she can't afford it. - Continue management as above - Will adjust home dose on discharge  Buttock abscess, s/p I&D on 1/20: Pain improved this morning. - Per surgery note, will get Sitz bath and shower today and then will be repacked. Dressing changes BID and PRN with BMs. - Abx: Transitioned to PO Clinda 1/22 (1/22>>) DC'd Vancomycin and Flagyl.  - Initial wound culture growing moderate Group B strep - Will follow-up wound cultures taken during I&D - May need home health wound care  Atypical chest pain: Pt endorsing new substernal "dull" chest pain that comes and goes. She states it feels like "gas pain". - EKG with Twvae inversion and mildly elevated troponins 1/21 - Cards consulted: Plan for stress test on 1/23. NPO aftermidnight - Miralax and Senokot, as she has not had a BM in 4 days. - Continue to monitor.  Hypokalemia: K 4.0 this morning. - K-Dur x 2 doses - Daily BMET  AKI, Resovled Cr 2.48 on admission (b/l  0.8-0.9) - IVFs as below - Avoid nephrotoxic agents; Stopped Toradol  - Daily BMPs  HTN, improved. BP 130/83 this morning. Takes Lisinopril  daily at home. - Will restart Lisinopril at  today - Follow-up as an outpatient.  FEN/GI: - Heart healthy, carb-modified diet - IVF @ 50cc - Protonix - Bowel regimen: Miralax and Senokot 2 tablets bid]  Prophylaxis: Sub-q heparin  Disposition: Pending stress test on 1/23  Subjective:  Chest pain resolved. Eating and drinking well.   Objective: Temp:  [98.8 F (37.1 C)-99.2 F (37.3 C)] 98.8 F (37.1 C) (01/22 0443) Pulse Rate:  [104-116] 104 (01/22 0443) Resp:  [16-18] 16 (01/22 0443) BP: (118-145)/(75-92) 130/83 mmHg (01/22 0443) SpO2:  [97 %-100 %] 97 % (01/22 0443) Physical Exam:  General: awake, alert Eyes: sclera Weitz, PERRL, EOMI ENTM: MMM Cardiovascular: RRR, no murmurs Respiratory: normal breathing, CTAB,speaks in full sentences Abdomen: obese, soft, NT/ND, +BS MSK: no edema, moves all extremities Skin: No rashes or lesions, bandage present over buttocks.  Laboratory:  Recent Labs Lab 01/01/16 1235 01/02/16 0551 01/03/16 0524  WBC 21.4* 17.4* 13.3*  HGB 10.8* 9.7* 9.5*  HCT 32.8* 29.3* 28.6*  PLT 330 289 291    Recent Labs Lab 01/01/16 2210 01/02/16 0551 01/03/16 0524  NA 135 136 137  K 3.9 4.0 3.4*  CL 106 110 108  CO2 18* 20* 19*  BUN <5* <5* <5*  CREATININE 0.89 1.00 0.79  CALCIUM 8.3* 8.1* 8.1*  GLUCOSE 339* 283* 250*     Recent Labs  Lab 01/02/16 1116 01/02/16 1628 01/02/16 2139 01/03/16 0524  TROPONINI 0.05* 0.04* 0.10* 0.04*    Hgb A1c: 16.2%  Imaging/Diagnostic Tests: No results found.  Jamal Collin, MD 01/03/2016, 10:19 AM PGY-3,  Family Medicine FPTS Intern pager: [phone], text pages welcome

## 2016-01-04 ENCOUNTER — Encounter (HOSPITAL_COMMUNITY): Payer: Medicaid Other

## 2016-01-04 ENCOUNTER — Inpatient Hospital Stay (HOSPITAL_COMMUNITY): Payer: Medicaid Other

## 2016-01-04 ENCOUNTER — Encounter (HOSPITAL_COMMUNITY): Payer: Self-pay | Admitting: General Surgery

## 2016-01-04 DIAGNOSIS — I248 Other forms of acute ischemic heart disease: Secondary | ICD-10-CM | POA: Insufficient documentation

## 2016-01-04 DIAGNOSIS — R072 Precordial pain: Secondary | ICD-10-CM

## 2016-01-04 DIAGNOSIS — I10 Essential (primary) hypertension: Secondary | ICD-10-CM | POA: Insufficient documentation

## 2016-01-04 DIAGNOSIS — R079 Chest pain, unspecified: Secondary | ICD-10-CM

## 2016-01-04 LAB — NM MYOCAR MULTI W/SPECT W/WALL MOTION / EF
CHL CUP RESTING HR STRESS: 101 {beats}/min
CSEPED: 5 min
CSEPEDS: 17 s
Estimated workload: 1 METS
Peak HR: 118 {beats}/min

## 2016-01-04 LAB — GLUCOSE, CAPILLARY
GLUCOSE-CAPILLARY: 239 mg/dL — AB (ref 65–99)
GLUCOSE-CAPILLARY: 264 mg/dL — AB (ref 65–99)
Glucose-Capillary: 92 mg/dL (ref 65–99)

## 2016-01-04 LAB — CULTURE, ROUTINE-ABSCESS

## 2016-01-04 LAB — BASIC METABOLIC PANEL
Anion gap: 10 (ref 5–15)
BUN: <5 mg/dL — ABNORMAL LOW (ref 6–20)
CALCIUM: 8.1 mg/dL — AB (ref 8.9–10.3)
CHLORIDE: 107 mmol/L (ref 101–111)
CO2: 23 mmol/L (ref 22–32)
Creatinine, Ser: 0.82 mg/dL (ref 0.44–1.00)
GFR calc non Af Amer: >60 mL/min (ref >60)
GLUCOSE: 259 mg/dL — AB (ref 65–99)
POTASSIUM: 3.6 mmol/L (ref 3.5–5.1)
SODIUM: 140 mmol/L (ref 135–145)

## 2016-01-04 LAB — CBC
HEMATOCRIT: 29.8 % — AB (ref 36.0–46.0)
HEMOGLOBIN: 9.8 g/dL — AB (ref 12.0–15.0)
MCH: 28.6 pg (ref 26.0–34.0)
MCHC: 32.9 g/dL (ref 30.0–36.0)
MCV: 86.9 fL (ref 78.0–100.0)
Platelets: 295 10*3/uL (ref 150–400)
RBC: 3.43 MIL/uL — AB (ref 3.87–5.11)
RDW: 15.2 % (ref 11.5–15.5)
WBC: 11.7 10*3/uL — ABNORMAL HIGH (ref 4.0–10.5)

## 2016-01-04 MED ORDER — INSULIN GLARGINE 100 UNIT/ML ~~LOC~~ SOLN
60.0000 [IU] | Freq: Every day | SUBCUTANEOUS | Status: DC
Start: 2016-01-04 — End: 2016-01-05
  Administered 2016-01-04 – 2016-01-05 (×2): 60 [IU] via SUBCUTANEOUS
  Filled 2016-01-04 (×2): qty 0.6

## 2016-01-04 MED ORDER — METOPROLOL TARTRATE 25 MG PO TABS
25.0000 mg | ORAL_TABLET | Freq: Two times a day (BID) | ORAL | Status: DC
  Administered 2016-01-04: 25 mg via ORAL
  Filled 2016-01-04: qty 1

## 2016-01-04 MED ORDER — REGADENOSON 0.4 MG/5ML IV SOLN
0.4000 mg | Freq: Once | INTRAVENOUS | Status: AC
Start: 2016-01-04 — End: 2016-01-04
  Administered 2016-01-04: 0.4 mg via INTRAVENOUS

## 2016-01-04 MED ORDER — REGADENOSON 0.4 MG/5ML IV SOLN
INTRAVENOUS | Status: AC
  Filled 2016-01-04: qty 5

## 2016-01-04 MED ORDER — METOPROLOL TARTRATE 25 MG PO TABS
25.0000 mg | ORAL_TABLET | Freq: Two times a day (BID) | ORAL | Status: DC
  Administered 2016-01-04 – 2016-01-05 (×2): 25 mg via ORAL
  Filled 2016-01-04 (×3): qty 1

## 2016-01-04 MED ORDER — TECHNETIUM TC 99M SESTAMIBI - CARDIOLITE
30.0000 | Freq: Once | INTRAVENOUS | Status: AC | PRN
  Administered 2016-01-04: 12:00:00 30 via INTRAVENOUS

## 2016-01-04 MED ORDER — ASPIRIN 81 MG PO CHEW
81.0000 mg | CHEWABLE_TABLET | Freq: Every day | ORAL | Status: DC
  Administered 2016-01-04 – 2016-01-05 (×2): 81 mg via ORAL
  Filled 2016-01-04 (×2): qty 1

## 2016-01-04 MED ORDER — LISINOPRIL 40 MG PO TABS
40.0000 mg | ORAL_TABLET | Freq: Every day | ORAL | Status: DC
Start: 2016-01-04 — End: 2016-01-05
  Administered 2016-01-04 – 2016-01-05 (×2): 40 mg via ORAL
  Filled 2016-01-04 (×2): qty 1

## 2016-01-04 MED ORDER — TECHNETIUM TC 99M SESTAMIBI GENERIC - CARDIOLITE
10.0000 | Freq: Once | INTRAVENOUS | Status: AC | PRN
  Administered 2016-01-04: 10 via INTRAVENOUS

## 2016-01-04 NOTE — Progress Notes (Signed)
Inpatient Diabetes Program Recommendations  AACE/ADA: New Consensus Statement on Inpatient Glycemic Control (2015)  Target Ranges:  Prepandial:   less than 140 mg/dL      Peak postprandial:   less than 180 mg/dL (1-2 hours)      Critically ill patients:  140 - 180 mg/dL   Spoke with patient about diabetes and home regimen for diabetes control. Patient reports that she has not been to an MD in awhile and has gotten the [employer] insulin to tie her over. She reports just receiving her Medicaid card and knows it will work for Physician visits but does not know about medication coverage. She said the social security office sent her the card.  Inquired about knowledge about A1C and patient reports that she does not know what an A1C is. Discussed A1C results (16.2% on 12/29/15) and explained what an A1C is, basic pathophysiology of DM Type 2, basic home care, importance of checking CBGs and maintaining good CBG control to prevent long-term and short-term complications. Discussed impact of nutrition, exercise, stress, sickness, and medications on diabetes control. Spoke with patient about the trying to get her on the right medications at d/c and seeing if her new insurance card will cover the expense of the card.  Patient verbalized understanding of information discussed and she states that she has no further questions at this time related to diabetes.   Thanks, Christena Deem RN, MSN, [facility] Inpatient Diabetes Coordinator Team Pager [phone] (8a-5p)

## 2016-01-04 NOTE — Progress Notes (Signed)
Family Medicine Teaching Service Daily Progress Note Intern Pager: [phone]  Patient name: Rebecca Rhodes Medical record number: [id number] Date of birth: 07/20/1981 Age: 35 y.o. Gender: female  Primary Care Provider: Tawni Carnes, MD Consultants: none Code Status: full  Pt Overview and Major Events to Date:  1/17: admitted with DKA 1/20: I&D of buttocks abscesses 1/21: transition to PO abx  Assessment and Plan: Rebecca Rhodes is a 35 y.o. female presenting with DKA . PMH is significant for T2DM, HTN, HLD.  DKA, resolved: Went back into DKA after her surgery, but gap closed 1/21. CBGs ranging from 226-278 in the last 24 hours. BMET is pending this morning. - Increase Lantus from 55 units to 60 units daily + Novolog 12 units tid with meals + resistant SSI.  - Will discharge on Novolin 70/30. - Consult to care management for medication needs. Pt is applying for Medicaid and will continue Novolin 70/30 until she gets Medicaid. - Cardiac monitoring  DM-2, poorly controlled: A1c 16.2%. Prescribed Novolin 70/30 50 units bid, but has been taking it daily because she can't afford it. - Continue management as above - Will adjust home dose on discharge  Atypical chest pain, resolved: Pt previously endorsing substernal "dull" chest pain that comes and goes. Chest pain has since resolved. EKG with T wave inversions in leads III and aVF and mildly elevated troponins, which peaked to 0.10 and then trended down to 0.05. - Cards consulted: Plan for stress test today. Has been NPO after midnight - Cardiac monitoring.  Buttock abscess, s/p I&D on 1/20: Pain improved this morning. Initial wound culture growing moderate Group B strep. Wound cultures taken during I&D showed abundant GP cocci in pairs and clusters, abundant GN rods, and few candida albicans. - Per surgery note, continue antibiotics and dressing changes. - Abx: Transitioned from Vancomycin and Flagyl to PO Clinda  qid on 1/22. - May need  home health wound care on discharge  Hypokalemia: K 4.0 yesterday. BMET pending from this morning. - K-Dur x 2 doses - Daily BMET  AKI, Resovled Cr 2.48 on admission (b/l 0.8-0.9) > 0.79 yesterday. - IVFs as below - Avoid nephrotoxic agents; Stopped Toradol  - Daily BMPs  HTN, improved. BP 145/90 this morning. Takes Lisinopril  daily at home. - Will increase Lisinopril from  to  daily. - Follow-up as an outpatient.  FEN/GI: - NPO for stress test since midnight. Will restart diet afterwards. - IVF @ 50cc - Protonix - Bowel regimen: Miralax and Senokot 2 tablets bid  Prophylaxis: Sub-q heparin  Disposition: Pending stress test today.  Subjective:  Pt states she is doing fine today. Her bottom hurts during dressing changes. She has no concerns this morning.  Objective: Temp:  [98.9 F (37.2 C)-99.4 F (37.4 C)] 98.9 F (37.2 C) (01/23 0420) Pulse Rate:  [106-113] 110 (01/23 0420) Resp:  [18-20] 18 (01/23 0111) BP: (133-150)/(87-91) 145/90 mmHg (01/23 0420) SpO2:  [98 %-100 %] 100 % (01/23 0420) Physical Exam:  General: awake, alert Eyes: sclera Janek, PERRL, EOMI ENTM: MMM Cardiovascular: RRR, no murmurs Respiratory: normal breathing, CTAB,speaks in full sentences Abdomen: obese, soft, NT/ND, +BS MSK: no edema, moves all extremities Skin: No rashes or lesions, dry clean circular lesions present in gluteal cleft with packing in place.   Laboratory:  Recent Labs Lab 01/01/16 1235 01/02/16 0551 01/03/16 0524  WBC 21.4* 17.4* 13.3*  HGB 10.8* 9.7* 9.5*  HCT 32.8* 29.3* 28.6*  PLT 330 289 291    Recent Labs  Lab 01/01/16 2210 01/02/16 0551 01/03/16 0524  NA 135 136 137  K 3.9 4.0 3.4*  CL 106 110 108  CO2 18* 20* 19*  BUN <5* <5* <5*  CREATININE 0.89 1.00 0.79  CALCIUM 8.3* 8.1* 8.1*  GLUCOSE 339* 283* 250*      Recent Labs Lab 01/02/16 2139 01/03/16 0524 01/03/16 1225 01/03/16 1918 01/03/16 2222  TROPONINI 0.10* 0.04* 0.08*  <0.03 0.05*    Hgb A1c: 16.2%  Imaging/Diagnostic Tests: No results found.  Campbell Stall, MD 01/04/2016, 7:08 AM PGY-1, [facility] Family Medicine FPTS Intern pager: [phone], text pages welcome

## 2016-01-04 NOTE — Progress Notes (Signed)
Patient presented for Lexiscan. Tolerated procedure well. Result to follow.   Fransisca Shawn, PAC 

## 2016-01-04 NOTE — Progress Notes (Signed)
Patient Name: Rebecca Rhodes Date of Encounter: 01/04/2016  Principal Problem:   Diabetic ketoacidosis without coma associated with type 2 diabetes mellitus (HCC) Active Problems:   HTN (hypertension)   Hyperlipidemia   Diabetes mellitus (HCC)   DKA (diabetic ketoacidoses) (HCC)   AKI (acute kidney injury) (HCC)   Abscess of buttock, right   Hypertensive urgency   DKA, type 2 (HCC)   Abscess   Diabetic ketoacidosis without coma (HCC)   Chest pain   Elevated troponin   Length of Stay: 6  SUBJECTIVE  Seen in nuc med. Feeling well. No chest pain, sob or palpitations.   CURRENT MEDS . atorvastatin  40 mg Oral q1800  . clindamycin  450 mg Oral 4 times per day  . fluconazole  150 mg Oral Daily  . heparin  5,000 Units Subcutaneous 3 times per day  . insulin aspart  0-20 Units Subcutaneous TID WC  . insulin aspart  0-5 Units Subcutaneous QHS  . insulin aspart  12 Units Subcutaneous TID WC  . insulin glargine  60 Units Subcutaneous Daily  . lisinopril  40 mg Oral Daily  . pantoprazole  40 mg Oral Daily  . polyethylene glycol  17 g Oral Daily  . senna-docusate  2 tablet Oral BID    OBJECTIVE  Filed Vitals:   01/03/16 2055 01/04/16 0111 01/04/16 0420 01/04/16 1017  BP: 133/87 143/89 145/90 127/66  Pulse: 107 113 110 104  Temp: 99.2 F (37.3 C) 99.4 F (37.4 C) 98.9 F (37.2 C) 98.5 F (36.9 C)  TempSrc: Oral Oral Oral Oral  Resp: Height:      Weight:      SpO2: 98% 100% 100% 100%    Intake/Output Summary (Last 24 hours) at 01/04/16 1043 Last data filed at 01/04/16 0738  Gross per 24 hour  Intake  997.5 ml  Output    500 ml  Net  497.5 ml   Filed Weights   12/28/15 1631 12/29/15 0944  Weight: 215 lb 3.2 oz (97.614 kg) 215 lb 9.8 oz (97.8 kg)    PHYSICAL EXAM  General: Pleasant, NAD. Neuro: Alert and oriented X 3. Moves all extremities spontaneously. Psych: Normal affect. HEENT:  Normal  Neck: Supple without bruits or JVD. Lungs:  Resp  regular and unlabored, CTA. Heart: RRR no s3, s4, or murmurs. Abdomen: Soft, non-tender, non-distended, BS + x 4.  Extremities: No clubbing, cyanosis or edema. DP/PT/Radials 2+ and equal bilaterally.  Accessory Clinical Findings  CBC  Recent Labs  01/01/16 1235 01/02/16 0551 01/03/16 0524  WBC 21.4* 17.4* 13.3*  NEUTROABS 17.8*  --   --   HGB 10.8* 9.7* 9.5*  HCT 32.8* 29.3* 28.6*  MCV 87.0 87.2 86.7  PLT 330 289 291   Basic Metabolic Panel  Recent Labs  01/02/16 0551 01/03/16 0524  NA 136 137  K 4.0 3.4*  CL 110 108  CO2 20* 19*  GLUCOSE 283* 250*  BUN <5* <5*  CREATININE 1.00 0.79  CALCIUM 8.1* 8.1*    Recent Labs  01/03/16 1225 01/03/16 1918 01/03/16 2222  TROPONINI 0.08* <0.03 0.05*   US Pelvis Limited  12/31/2015  CLINICAL DATA:  Left and right buttock abscesses for 1 week. History of hypertension diabetes. Patient reports drainage from the right buttock abscess. EXAM: LIMITED ULTRASOUND OF PELVIS TECHNIQUE: Limited transabdominal ultrasound examination of the pelvis was performed. COMPARISON:  None. FINDINGS: Ultrasound is performed of the medial gluteal regions bilaterally. Right: There  is diffuse subcutaneous edema. Small focal hypoechoic collection is 1.3 x 0.5 x 1.2 cm. Left: Subcutaneous edema. Complex hypoechoic collection is 3.6 x 2.3 x 3.7 cm. IMPRESSION: 1. Significant edema without drainable abscess involving the right buttock region. 2. 3.6 x 2.3 x 3.7 cm abscess in the left buttock. Electronically Signed   By: Norva Pavlov M.D.   On: 12/31/2015 13:31   TELE: unable to review as patient seen in nuc med.   ECG: SR, negative T wave in the inferior leads that are new when compared to 01/18/2015  Stress echo: 2011 Study Conclusions  - Stress ECG conclusions: The stress ECG was normal. - Staged echo: Normal echo stress Impressions:  - No evidence for ischemia by echo images. Poor exercise tolerance  and hypertensive BP response. Needs  control of BP.   ASSESSMENT AND PLAN  Rebecca Rhodes is a 35 y.o. female with a h/o DM, hypertension, hyperlipidemia who presented for buttock abscess and was found to be in DKA. She was not able to afford her medications and went into DKA. On 1/21, began complaining on dull chest pain that comes and goes. She feels that this is due to gas pain. Troponins have been 0.05>0.04>0.1>0.04. She says that she has been having similar chest discomfort for the past few weeks. The discomfort is a dull ache in the center of her chest that does not radiate. It occurs when she is at rest and during exertion. She says that sometime it is improved with belching, but at other times this does not help. She has had one episode of this during this admission which resolved spontaneously. She did not have an ECG during the discomfort, but does have new T wave inversions on her ECG.  Chest pain: Atypical in nature but she does have new T wave inversions in her inferior leads with a mildly elevated troponin. This could all be due to her DKA, but with TWI, it is possible that this uncontrolled diabetic could have CAD.Currently chest pain free. Pending lexiscan result.   Anemia: hemoglobin of 12.5 12/28/15. Today 9.8. Eval per primary  HTN: Stable. Continue current regimen  Signed Bhagat,Bhavinkumar, PAC   The patient was seen, examined and discussed with Bhagat,Bhavinkumar PA-C and I agree with the above.   35 year old female with h/o DM, morbid obesity, HLP admitted with DKA and sepsis sec to buttocks abscess. She had minimally elevated troponin. Her Lexiscan nuclear stress today showed normal LVEF, no prior infarct and no ischemia. Her chest pain has resolved.  Her elevated troponin is mots probably demand ischemia in the settings of sepsis and DKA.  Continue lisinopril, atorvastatin, add metoprolol 25 mg po BID and aspirin 81 mg po daily.     Lars Masson 01/04/2016

## 2016-01-04 NOTE — Progress Notes (Signed)
3 Days Post-Op  Subjective: She is doing much better. I had her get up to the bathroom and in sitz before seeing the wounds,  The larges on the left still burrows down and up in 2 areas up to 7 cm.  This is being packed and all of the incisions look clean.  The purulent discharge is much better, areas between 2 incisions is still indurated and hard, but I don't find any communication between the 2 open sites here. i can see good pink granulation tissue in each site now. She has lost some superficial skin between the two open sites and around the open areas.    Objective: Vital signs in last 24 hours: Temp:  [98.9 F (37.2 C)-99.4 F (37.4 C)] 98.9 F (37.2 C) (01/23 0420) Pulse Rate:  [106-113] 110 (01/23 0420) Resp:  [18-20] 18 (01/23 0111) BP: (133-150)/(87-91) 145/90 mmHg (01/23 0420) SpO2:  [98 %-100 %] 100 % (01/23 0420) Last BM Date: 01/03/16  Intake/Output from previous day: 01/22 0701 - 01/23 0700 In: 907.5 [P.O.:300; I.V.:607.5] Out: 500 [Urine:500] Intake/Output this shift: Total I/O In: 90 [I.V.:90] Out: -   General appearance: alert, cooperative and no distress Skin: see description above.  Lab Results:   Recent Labs  01/02/16 0551 01/03/16 0524  WBC 17.4* 13.3*  HGB 9.7* 9.5*  HCT 29.3* 28.6*  PLT 289 291    BMET  Recent Labs  01/02/16 0551 01/03/16 0524  NA 136 137  K 4.0 3.4*  CL 110 108  CO2 20* 19*  GLUCOSE 283* 250*  BUN <5* <5*  CREATININE 1.00 0.79  CALCIUM 8.1* 8.1*   PT/INR No results for input(s): LABPROT, INR in the last 72 hours.  No results for input(s): AST, ALT, ALKPHOS, BILITOT, PROT, ALBUMIN in the last 168 hours.   Lipase     Component Value Date/Time   LIPASE 46 10/24/2012 1137     Studies/Results: No results found.  Medications: . atorvastatin  40 mg Oral q1800  . clindamycin  450 mg Oral 4 times per day  . fluconazole  150 mg Oral Daily  . heparin  5,000 Units Subcutaneous 3 times per day  . insulin aspart   0-20 Units Subcutaneous TID WC  . insulin aspart  0-5 Units Subcutaneous QHS  . insulin aspart  12 Units Subcutaneous TID WC  . insulin glargine  60 Units Subcutaneous Daily  . lisinopril  40 mg Oral Daily  . pantoprazole  40 mg Oral Daily  . polyethylene glycol  17 g Oral Daily  . senna-docusate  2 tablet Oral BID   Specimen Description ABSCESS BUTTOCKS from surgery   Special Requests NONE   Gram Stain ABUNDANT WBC PRESENT, PREDOMINANTLY PMN  NO SQUAMOUS EPITHELIAL CELLS SEEN  ABUNDANT GRAM POSITIVE COCCI  IN PAIRS IN CLUSTERS IN CHAINS ABUNDANT GRAM NEGATIVE RODS  Performed at [facility]       Culture FEW CANDIDA ALBICANS  Performed at [facility]           Assessment/Plan Bilateral gluteal abscesses, 2 on left and one on the right S/p Incision, drainage, and debridement of bilateral gluteal abscesses x3, 01/01/16, Dr. Gaynelle Adu. POD #1 DKA AODM Body mass index is 36.9 Antibiotics: She has been treated with levofloxacin x1 day, Cipro/Clindamycin x 4 days, converted to Flagyl/Vancomycin post op 01/01/16.  Now back on Clindamycin and diflucan 01/03/16. DVT: Heparin/SCD  Plan:  Continue current wound care and antibiotics      LOS: 6  days    Cai Flott 01/04/2016

## 2016-01-04 NOTE — Progress Notes (Signed)
  Echocardiogram 2D Echocardiogram has been performed.  Rebecca Rhodes 01/04/2016, 1:35 PM

## 2016-01-05 LAB — BASIC METABOLIC PANEL
ANION GAP: 7 (ref 5–15)
BUN: <5 mg/dL — ABNORMAL LOW (ref 6–20)
CHLORIDE: 108 mmol/L (ref 101–111)
CO2: 25 mmol/L (ref 22–32)
Calcium: 8 mg/dL — ABNORMAL LOW (ref 8.9–10.3)
Creatinine, Ser: 0.77 mg/dL (ref 0.44–1.00)
GFR calc Af Amer: >60 mL/min (ref >60)
GFR calc non Af Amer: >60 mL/min (ref >60)
GLUCOSE: 135 mg/dL — AB (ref 65–99)
POTASSIUM: 3.3 mmol/L — AB (ref 3.5–5.1)
Sodium: 140 mmol/L (ref 135–145)

## 2016-01-05 LAB — CBC
HEMATOCRIT: 28.7 % — AB (ref 36.0–46.0)
HEMOGLOBIN: 9.1 g/dL — AB (ref 12.0–15.0)
MCH: 27.3 pg (ref 26.0–34.0)
MCHC: 31.7 g/dL (ref 30.0–36.0)
MCV: 86.2 fL (ref 78.0–100.0)
Platelets: 329 10*3/uL (ref 150–400)
RBC: 3.33 MIL/uL — ABNORMAL LOW (ref 3.87–5.11)
RDW: 15.1 % (ref 11.5–15.5)
WBC: 10.9 10*3/uL — ABNORMAL HIGH (ref 4.0–10.5)

## 2016-01-05 LAB — GLUCOSE, CAPILLARY
Glucose-Capillary: 130 mg/dL — ABNORMAL HIGH (ref 65–99)
Glucose-Capillary: 113 mg/dL — ABNORMAL HIGH (ref 65–99)

## 2016-01-05 MED ORDER — SENNOSIDES-DOCUSATE SODIUM 8.6-50 MG PO TABS: 2.0000 | ORAL_TABLET | Freq: Two times a day (BID) | ORAL | Status: DC

## 2016-01-05 MED ORDER — TRUE METRIX METER DEVI: 60.0000 [IU] | Freq: Every day | Status: DC

## 2016-01-05 MED ORDER — ATORVASTATIN CALCIUM 40 MG PO TABS
40.0000 mg | ORAL_TABLET | Freq: Every day | ORAL | Status: DC
Start: 2016-01-05 — End: 2016-03-01

## 2016-01-05 MED ORDER — CLINDAMYCIN HCL 150 MG PO CAPS: 450.0000 mg | ORAL_CAPSULE | Freq: Four times a day (QID) | ORAL | Status: DC

## 2016-01-05 MED ORDER — GLUCOSE BLOOD VI STRP: ORAL_STRIP | Status: DC

## 2016-01-05 MED ORDER — HYDROCODONE-ACETAMINOPHEN 7.5-325 MG PO TABS: 1.0000 | ORAL_TABLET | ORAL | Status: DC | PRN

## 2016-01-05 MED ORDER — FLUCONAZOLE 150 MG PO TABS
150.0000 mg | ORAL_TABLET | Freq: Every day | ORAL | Status: DC
Start: 2016-01-05 — End: 2016-02-10

## 2016-01-05 MED ORDER — INSULIN ASPART 100 UNIT/ML ~~LOC~~ SOLN: 20.0000 [IU] | Freq: Three times a day (TID) | SUBCUTANEOUS | Status: DC

## 2016-01-05 MED ORDER — POLYETHYLENE GLYCOL 3350 17 G PO PACK: 17.0000 g | PACK | Freq: Every day | ORAL | Status: DC

## 2016-01-05 MED ORDER — POTASSIUM CHLORIDE CRYS ER 20 MEQ PO TBCR
40.0000 meq | EXTENDED_RELEASE_TABLET | Freq: Two times a day (BID) | ORAL | Status: DC
  Administered 2016-01-05: 40 meq via ORAL
  Filled 2016-01-05: qty 2

## 2016-01-05 MED ORDER — ASPIRIN 81 MG PO CHEW: 81.0000 mg | CHEWABLE_TABLET | Freq: Every day | ORAL | Status: DC

## 2016-01-05 MED ORDER — INSULIN GLARGINE 100 UNIT/ML ~~LOC~~ SOLN: 60.0000 [IU] | Freq: Every day | SUBCUTANEOUS | Status: DC

## 2016-01-05 MED ORDER — METOPROLOL TARTRATE 25 MG PO TABS: 25.0000 mg | ORAL_TABLET | Freq: Two times a day (BID) | ORAL | Status: DC

## 2016-01-05 MED FILL — POLYETHYLENE GLYCOL 3350: 15 days supply | Qty: 255 | Fill #0

## 2016-01-05 MED FILL — FLUCONAZOLE 150 MG TABLET: 150 | 4 days supply | Qty: 4 | Fill #0

## 2016-01-05 MED FILL — CLINDAMYCIN HCL 150 MG CAPS: 150 | 2 days supply | Qty: 16 | Fill #0

## 2016-01-05 NOTE — Clinical Documentation Improvement (Signed)
Family Medicine  Can the diagnosis of systemic infection be further specified?   Sepsis - specify causative organism if known  Determine if there is Severe Sepsis (Sepsis with organ dysfunction - specify), Septic Shock if present  Identify etiology of Sepsis - Device, Implant, Graft, Infusion, Abortion  Specify organ dysfunction - Respiratory Failure, Encephalopathy, Acute Kidney Failure, Pneumonia, UTI, Other (specify), Unable to Clinically Determine}  Other  Clinically Undetermined  Document any associated diagnoses/conditions. Please document if sepsis was present on admission.   Supporting Information: 35 year old female with h/o DM, morbid obesity, HLP admitted with DKA and sepsis sec to buttocks abscess.  Her elevated troponin is most probably demand ischemia in the settings of sepsis and DKA.     Please exercise your independent, professional judgment when responding. A specific answer is not anticipated or expected.   Thank Sabino Donovan [facility] [place] [phone]

## 2016-01-05 NOTE — Progress Notes (Signed)
4 Days Post-Op  Subjective: Sites all look good, she had showered but still had some gauze in place. I think she can do this at home.  Objective: Vital signs in last 24 hours: Temp:  [98.1 F (36.7 C)-98.6 F (37 C)] 98.2 F (36.8 C) (01/24 0533) Pulse Rate:  [88-113] 97 (01/24 0940) Resp:  [16-20] 20 (01/24 0533) BP: (126-156)/(66-100) 133/84 mmHg (01/24 0940) SpO2:  [98 %-100 %] 98 % (01/24 0533) Last BM Date: 01/04/16 480 Po Afebrile, VSS K+ 3.3 WBC slowly improving  Intake/Output from previous day: 01/23 0701 - 01/24 0700 In: 1708.3 [P.O.:480; I.V.:1228.3] Out: -  Intake/Output this shift: Total I/O In: 34.2 [I.V.:34.2] Out: -   General:  She looks good, no distress, comfortable after shower. Skin:  Sites all look good. She is doing much better.  Lab Results:   Recent Labs  01/04/16 0934 01/05/16 0546  WBC 11.7* 10.9*  HGB 9.8* 9.1*  HCT 29.8* 28.7*  PLT 295 329    BMET  Recent Labs  01/04/16 0934 01/05/16 0546  NA 140 140  K 3.6 3.3*  CL 107 108  CO2 23 25  GLUCOSE 259* 135*  BUN <5* <5*  CREATININE 0.82 0.77  CALCIUM 8.1* 8.0*   PT/INR No results for input(s): LABPROT, INR in the last 72 hours.  No results for input(s): AST, ALT, ALKPHOS, BILITOT, PROT, ALBUMIN in the last 168 hours.   Lipase     Component Value Date/Time   LIPASE 46 10/24/2012 1137     Studies/Results: Nm Myocar Multi W/spect W/wall Motion / Ef  01/04/2016   There was no ST segment deviation noted during stress.  No T wave inversion was noted during stress.  Defect 1: There is a small defect of mild severity.  This is a low risk study.  Nuclear stress EF: 60%.  Small, mild fixed septal attenuation artifact. No reversible ischemia. LVEF 60% with normal wall motion. This is a low risk study.    Medications: . aspirin  81 mg Oral Daily  . atorvastatin  40 mg Oral q1800  . clindamycin  450 mg Oral 4 times per day  . fluconazole  150 mg Oral Daily  . heparin   5,000 Units Subcutaneous 3 times per day  . insulin aspart  0-20 Units Subcutaneous TID WC  . insulin aspart  0-5 Units Subcutaneous QHS  . insulin aspart  12 Units Subcutaneous TID WC  . insulin glargine  60 Units Subcutaneous Daily  . lisinopril  40 mg Oral Daily  . metoprolol tartrate  25 mg Oral BID  . pantoprazole  40 mg Oral Daily  . polyethylene glycol  17 g Oral Daily  . potassium chloride  40 mEq Oral BID  . senna-docusate  2 tablet Oral BID    Assessment/Plan Bilateral gluteal abscesses, 2 on left and one on the right S/p Incision, drainage, and debridement of bilateral gluteal abscesses x3, 01/01/16, Dr. Gaynelle Adu. POD #1 DKA AODM Body mass index is 36.9 Antibiotics: She has been treated with levofloxacin x1 day, Cipro/Clindamycin x 4 days, converted to Flagyl/Vancomycin post op 01/01/16. Now back on Clindamycin and diflucan 01/03/16. DVT: Heparin/SCD   Plan:  Home health if she can get it dressing changes BID, her significant other is learning that today.  Continue antibiotics, I will get her follow up in our office.    LOS: 7 days    Adeliz Tonkinson 01/05/2016

## 2016-01-05 NOTE — Discharge Instructions (Signed)
You were admitted with DKA and buttock abscesses.  You were treated with IV insulin and transitioned to a new insulin regimen. We have given you samples of Lantus and Novolog until your Medicaid card arrives. Please take Lantus 60 units once a day and Novolog 12 units three times a day with meals. You are being discharged with Home Health for assistance with your buttock wound care.     Blood Glucose Monitoring, Adult Monitoring your blood glucose (also know as blood sugar) helps you to manage your diabetes. It also helps you and your health care provider monitor your diabetes and determine how well your treatment plan is working. WHY SHOULD YOU MONITOR YOUR BLOOD GLUCOSE?  It can help you understand how food, exercise, and medicine affect your blood glucose.  It allows you to know what your blood glucose is at any given moment. You can quickly tell if you are having low blood glucose (hypoglycemia) or high blood glucose (hyperglycemia).  It can help you and your health care provider know how to adjust your medicines.  It can help you understand how to manage an illness or adjust medicine for exercise. WHEN SHOULD YOU TEST? Your health care provider will help you decide how often you should check your blood glucose. This may depend on the type of diabetes you have, your diabetes control, or the types of medicines you are taking. Be sure to write down all of your blood glucose readings so that this information can be reviewed with your health care provider. See below for examples of testing times that your health care provider may suggest. Type 1 Diabetes  Test at least 2 times per day if your diabetes is well controlled, if you are using an insulin pump, or if you perform multiple daily injections.  If your diabetes is not well controlled or if you are sick, you may need to test more often.  It is a good idea to also test:  Before every insulin injection.  Before and after  exercise.  Between meals and 2 hours after a meal.  Occasionally between 2:00 a.m. and 3:00 a.m. Type 2 Diabetes  If you are taking insulin, test at least 2 times per day. However, it is best to test before every insulin injection.  If you take medicines by mouth (orally), test 2 times a day.  If you are on a controlled diet, test once a day.  If your diabetes is not well controlled or if you are sick, you may need to monitor more often. HOW TO MONITOR YOUR BLOOD GLUCOSE Supplies Needed  Blood glucose meter.  Test strips for your meter. Each meter has its own strips. You must use the strips that go with your own meter.  A pricking needle (lancet).  A device that holds the lancet (lancing device).  A journal or log book to write down your results. Procedure  Wash your hands with soap and water. Alcohol is not preferred.  Prick the side of your finger (not the tip) with the lancet.  Gently milk the finger until a small drop of blood appears.  Follow the instructions that come with your meter for inserting the test strip, applying blood to the strip, and using your blood glucose meter. Other Areas to Get Blood for Testing Some meters allow you to use other areas of your body (other than your finger) to test your blood. These areas are called alternative sites. The most common alternative sites are:  The forearm.  The thigh.  The back area of the lower leg.  The palm of the hand. The blood flow in these areas is slower. Therefore, the blood glucose values you get may be delayed, and the numbers are different from what you would get from your fingers. Do not use alternative sites if you think you are having hypoglycemia. Your reading will not be accurate. Always use a finger if you are having hypoglycemia. Also, if you cannot feel your lows (hypoglycemia unawareness), always use your fingers for your blood glucose checks. ADDITIONAL TIPS FOR GLUCOSE MONITORING  Do not reuse  lancets.  Always carry your supplies with you.  All blood glucose meters have a 24-hour "hotline" number to call if you have questions or need help.  Adjust (calibrate) your blood glucose meter with a control solution after finishing a few boxes of strips. BLOOD GLUCOSE RECORD KEEPING It is a good idea to keep a daily record or log of your blood glucose readings. Most glucose meters, if not all, keep your glucose records stored in the meter. Some meters come with the ability to download your records to your home computer. Keeping a record of your blood glucose readings is especially helpful if you are wanting to look for patterns. Make notes to go along with the blood glucose readings because you might forget what happened at that exact time. Keeping good records helps you and your health care provider to work together to achieve good diabetes management.    This information is not intended to replace advice given to you by your health care provider. Make sure you discuss any questions you have with your health care provider.   Document Released: 12/01/2003 Document Revised: 12/19/2014 Document Reviewed: 04/22/2013 Elsevier Interactive Patient Education 2016 [employer]   Abscess An abscess is an infected area that contains a collection of pus and debris.It can occur in almost any part of the body. An abscess is also known as a furuncle or boil. CAUSES  An abscess occurs when tissue gets infected. This can occur from blockage of oil or sweat glands, infection of hair follicles, or a minor injury to the skin. As the body tries to fight the infection, pus collects in the area and creates pressure under the skin. This pressure causes pain. People with weakened immune systems have difficulty fighting infections and get certain abscesses more often.  SYMPTOMS Usually an abscess develops on the skin and becomes a painful mass that is red, warm, and tender. If the abscess forms under the skin, you  may feel a moveable soft area under the skin. Some abscesses break open (rupture) on their own, but most will continue to get worse without care. The infection can spread deeper into the body and eventually into the bloodstream, causing you to feel ill.  DIAGNOSIS  Your caregiver will take your medical history and perform a physical exam. A sample of fluid may also be taken from the abscess to determine what is causing your infection. TREATMENT  Your caregiver may prescribe antibiotic medicines to fight the infection. However, taking antibiotics alone usually does not cure an abscess. Your caregiver may need to make a small cut (incision) in the abscess to drain the pus. In some cases, gauze is packed into the abscess to reduce pain and to continue draining the area. HOME CARE INSTRUCTIONS   Only take over-the-counter or prescription medicines for pain, discomfort, or fever as directed by your caregiver.  If you were prescribed antibiotics, take them as  directed. Finish them even if you start to feel better.  If gauze is used, follow your caregiver's directions for changing the gauze.  To avoid spreading the infection:  Keep your draining abscess covered with a bandage.  Wash your hands well.  Do not share personal care items, towels, or whirlpools with others.  Avoid skin contact with others.  Keep your skin and clothes clean around the abscess.  Keep all follow-up appointments as directed by your caregiver. SEEK MEDICAL CARE IF:   You have increased pain, swelling, redness, fluid drainage, or bleeding.  You have muscle aches, chills, or a general ill feeling.  You have a fever. MAKE SURE YOU:   Understand these instructions.  Will watch your condition.  Will get help right away if you are not doing well or get worse.   This information is not intended to replace advice given to you by your health care provider. Make sure you discuss any questions you have with your health  care provider.   Document Released: 09/07/2005 Document Revised: 05/29/2012 Document Reviewed: 02/10/2012 Elsevier Interactive Patient Education 2016 [employer].   CCS _______[facility]  RECTAL SURGERY POST OP INSTRUCTIONS: POST OP INSTRUCTIONS  Always review your discharge instruction sheet given to you by the facility where your surgery was performed. IF YOU HAVE DISABILITY OR FAMILY LEAVE FORMS, YOU MUST BRING THEM TO THE OFFICE FOR PROCESSING.   DO NOT GIVE THEM TO YOUR DOCTOR.  1. A  prescription for pain medication may be given to you upon discharge.  Take your pain medication as prescribed, if needed.  If narcotic pain medicine is not needed, then you may take acetaminophen (Tylenol) or ibuprofen (Advil) as needed. 2. Take your usually prescribed medications unless otherwise directed. 3. If you need a refill on your pain medication, please contact your pharmacy.  They will contact our office to request authorization. Prescriptions will not be filled after 5 pm or on week-ends. 4. You should follow a light diet the first 48 hours after arrival home, diabetic diet.  Be sure to include lots of fluids daily.  Resume your normal diet 2-3 days after surgery.. 5. Most patients will experience some swelling and discomfort in the rectal area. Ice packs, reclining and warm tub soaks will help.  Swelling and discomfort can take several days to resolve.  6. It is common to experience some constipation if taking pain medication after surgery.  Increasing fluid intake and taking a stool softener (such as Colace) will usually help or prevent this problem from occurring.  A mild laxative (Milk of Magnesia or Miralax) should be taken according to package directions if there are no bowel movements after 48 hours. 7. Unless discharge instructions indicate otherwise, leave your bandage dry and in place for 24 hours, or remove the bandage if you have a bowel movement. You may notice a  small amount of bleeding with bowel movements for the first few days. You may have some packing in the rectum which will come out over the first day or two. You will need to wear an absorbent pad or soft cotton gauze in your underwear until the drainage stops.it. 8. ACTIVITIES:  You may resume regular (light) daily activities beginning the next day--such as daily self-care, walking, climbing stairs--gradually increasing activities as tolerated.  You may have sexual intercourse when it is comfortable.  Refrain from any heavy lifting or straining until approved by your doctor. a. You may drive when you are no longer  taking prescription pain medication, you can comfortably wear a seatbelt, and you can safely maneuver your car and apply brakes. b. RETURN TO WORK: : ____________________ c.  9. You should see your doctor in the office for a follow-up appointment approximately 2-3 weeks after your surgery.  Make sure that you call for this appointment within a day or two after you arrive home to insure a convenient appointment time. 10. OTHER INSTRUCTIONS:  __________________________________________________________________________________________________________________________________________________________________________________________  WHEN TO CALL YOUR DOCTOR: 1. Fever over 101.0 2. Inability to urinate 3. Nausea and/or vomiting 4. Extreme swelling or bruising 5. Continued bleeding from rectum. 6. Increased pain, redness, or drainage from the incision 7. Constipation  The clinic staff is available to answer your questions during regular business hours.  Please dont hesitate to call and ask to speak to one of the nurses for clinical concerns.  If you have a medical emergency, go to the nearest emergency room or call 911.  A surgeon from [facility] is always on call at the hospital   [address], [place], [place]  [address] ?  [address] [address], [place], [place]    [address] [phone] ? [phone] ? FAX [phone] Web site: [email or web address]    Dressing changes BID and prn after BM.  Let her sit in a warm Stiz bath then shower, she can get soap and water into all of the wounds.  Gently repack with 1/4 or 1/2 iodoform.  Get to the base of the wounds, it does not have to be tight.   ABD and mesh panties.   If she soils the chucks, she needs to take another shower and sitz bath and be redone.  She can take out packing after soaking in the sitz or shower. Dr. Magnus Ivan

## 2016-01-05 NOTE — Plan of Care (Signed)
Problem: Food- and Nutrition-Related Knowledge Deficit (NB-1.1) Goal: Nutrition education Formal process to instruct or train a patient/client in a skill or to impart knowledge to help patients/clients voluntarily manage or modify food choices and eating behavior to maintain or improve health. Outcome: Adequate for Discharge  RD consulted for nutrition education regarding diabetes.     Lab Results  Component Value Date    HGBA1C 16.2* 12/29/2015    RD provided "Carbohydrate Counting for People with Diabetes" handout from the Academy of Nutrition and Dietetics. Discussed different food groups and their effects on blood sugar, emphasizing carbohydrate-containing foods. Provided list of carbohydrates and recommended serving sizes of common foods.  Discussed importance of controlled and consistent carbohydrate intake throughout the day. Provided examples of ways to balance meals/snacks and encouraged intake of high-fiber, whole grain complex carbohydrates. Teach back method used.  Expect fair compliance.  Body mass index is 36.99 kg/(m^2). Pt meets criteria for obesity, class II based on current BMI.  Current diet order is Heart Healthy/ Carb Modified, patient is consuming approximately 100% of meals at this time. Labs and medications reviewed. No further nutrition interventions warranted at this time. RD contact information provided. If additional nutrition issues arise, please re-consult RD.  Ciin Brazzel A. Mayford Knife, RD, LDN, CDE Pager: [phone] After hours Pager: [phone]

## 2016-01-05 NOTE — Progress Notes (Signed)
Patient stated husband can not come to bedside today to watch dressing change. RN explained to patient the importance of the dressing getting changed twice a day at home by a family member and the need to have someone come to the bedside to watch/learn about dressing. Patient stated no one can come. Case Manager contacted and arranged with Home Health to come see patient tomorrow for dressing care at home. Patient stated her husband will be home and can learn about dressing tomorrow. Dressing change process verbalized today with patient using teach back.

## 2016-01-05 NOTE — Progress Notes (Signed)
CSW consulted regarding transportation needs at discharge. Patient reported having no one to come get her. CSW provided patient with a bus pass.  CSW signing off.  Osborne Casco Josselin Gaulin LCSWA [phone]

## 2016-01-05 NOTE — Progress Notes (Signed)
Family Medicine Teaching Service Daily Progress Note Intern Pager: [phone]  Patient name: Rebecca Rhodes Medical record number: [id number] Date of birth: [birth year] Age: 35 y.o. Gender: female  Primary Care Provider: Tawni Carnes, MD Consultants: none Code Status: full  Pt Overview and Major Events to Date:  1/17: admitted with DKA 1/20: I&D of buttocks abscesses 1/21: transition to PO abx 1/23: Pt underwent stress test, which was low risk.  Assessment and Plan: Rebecca Rhodes is a 35 y.o. female presenting with DKA, which has now resolved. PMH is significant for T2DM, HTN, HLD.  DM-2, poorly controlled: Previously in DKA, which has now resolved. A1c 16.2%. Previously using Novolin 70/30 50 units bid. CBGs have been mostly in the mid- 200s, but she did have a CBG to 92 last night. 130 this morning. - Currently on Lantus 60 units daily + Novolog 12 units tid with meals + resistant SSI.  - Will discharge on Novolin 70/30. - Consult to care management for medication needs. Pt has not yet received her Medicaid card. - Cardiac monitoring - Medication Samples have been provided to the patient.  Drug name: Lantus  Qty: 1 vial  LOT: [id number]  Exp.Date: 05/11/18  Drug name: Novolog  Qty: 1 vial LOT: [id number]  Exp.Date: 02/08/17  The patient has been instructed regarding the correct time, dose, and frequency of taking this medication, including desired effects and most common side effects.   Rebecca Rhodes 1:44 PM 01/05/2016  Atypical chest pain, resolved: Pt previously endorsing substernal "dull" chest pain that comes and goes. Chest pain has since resolved. EKG with T wave inversions in leads III and aVF and mildly elevated troponins, which peaked to 0.10 and then trended down to 0.05. - Cards consulted: Pt had low risk stress test on 1/23. - Cardiac monitoring.  Buttock abscess, s/p I&D on 1/20: Pain improved this morning. Initial wound culture growing moderate Group B strep. Wound cultures taken  during I&D showed abundant GP cocci in pairs and clusters, abundant GN rods, and few candida albicans. - Abx: Currently on PO Clinda  qid. - Per surgery, will likely be good for discharge today. They will drop a note. - Home health wound care has been ordered at discharge.  Hypokalemia: K 3.3 this morning.  - Will give K-Dur 40 mEq bid today. - Daily BMPs  AKI, Resovled Cr 2.48 on admission (b/l 0.8-0.9) > 0.77 yesterday. - IVFs as below - Avoid nephrotoxic agents  - Daily BMPs  HTN, improved. BP 126/79 this morning.  - Continue home Lisinopril  daily. - Follow-up as an outpatient.  FEN/GI: - SLIV - Protonix - Bowel regimen: Miralax and Senokot 2 tablets bid  Prophylaxis: Sub-q heparin  Disposition: Home hopefully today with home health wound care.  Subjective:  Pt states she is doing well today. She is ready for discharge today. No concerns.  Objective: Temp:  [98.2 F (36.8 C)-98.6 F (37 C)] 98.2 F (36.8 C) (01/24 0533) Pulse Rate:  [88-113] 88 (01/24 0533) Resp:  [16-20] 20 (01/24 0533) BP: (126-156)/(66-100) 126/79 mmHg (01/24 0533) SpO2:  [98 %-100 %] 98 % (01/24 0533) Physical Exam:  General: awake, alert Eyes: sclera Rebecca Rhodes, PERRL, EOMI ENTM: MMM Cardiovascular: RRR, no murmurs Respiratory: normal breathing, CTAB,speaks in full sentences Abdomen: obese, soft, NT/ND, +BS MSK: no edema, moves all extremities Skin: No rashes  Laboratory:  Recent Labs Lab 01/03/16 0524 01/04/16 0934 01/05/16 0546  WBC 13.3* 11.7* 10.9*  HGB 9.5* 9.8* 9.1*  HCT 28.6* 29.8* 28.7*  PLT 291 295 329    Recent Labs Lab 01/03/16 0524 01/04/16 0934 01/05/16 0546  NA 137 140 140  K 3.4* 3.6 3.3*  CL 108 107 108  CO2 19* 23 25  BUN <5* <5* <5*  CREATININE 0.79 0.82 0.77  CALCIUM 8.1* 8.1* 8.0*  GLUCOSE 250* 259* 135*      Recent Labs Lab 01/02/16 2139 01/03/16 0524 01/03/16 1225 01/03/16 1918 01/03/16 2222  TROPONINI 0.10* 0.04* 0.08* <0.03  0.05*    Hgb A1c: 16.2%  Imaging/Diagnostic Tests: Nm Myocar Multi W/spect W/wall Motion / Ef  01/04/2016   There was no ST segment deviation noted during stress.  No T wave inversion was noted during stress.  Defect 1: There is a small defect of mild severity.  This is a low risk study.  Nuclear stress EF: 60%.  Small, mild fixed septal attenuation artifact. No reversible ischemia. LVEF 60% with normal wall motion. This is a low risk study.    Campbell Stall, MD 01/05/2016, 6:51 AM PGY-1, [facility] Family Medicine FPTS Intern pager: [phone], text pages welcome

## 2016-01-05 NOTE — Progress Notes (Addendum)
Deanna Artis Cassar to be D/C'd Home per MD order.  Discussed with the patient and all questions fully answered.  VSS  Dressing clean, dry and intact.   IV catheter discontinued intact. Site without signs and symptoms of complications. Dressing and pressure applied.  An After Visit Summary was printed and given to the patient. Patient received prescription. Pharmacy Resident reviewed new medications with patient.   D/c education completed with patient/family including follow up instructions, medication list, d/c activities limitations if indicated, with other d/c instructions as indicated by MD - patient able to verbalize understanding, all questions fully answered.   Patient instructed to return to ED, call 911, or call MD for any changes in condition.   Patient to be escorted via WC, and D/C home via public transportation. Bus pass provided.   L'ESPERANCE, Banyan Goodchild C 01/05/2016 4:14 PM

## 2016-01-06 ENCOUNTER — Other Ambulatory Visit: Payer: Self-pay | Admitting: Family Medicine

## 2016-01-06 LAB — ANAEROBIC CULTURE

## 2016-01-06 NOTE — Discharge Summary (Signed)
Family Medicine Teaching [facility] Discharge Summary  Patient name: Rebecca Rhodes Medical record number: [id number] Date of birth: [birth year] Age: 35 y.o. Gender: female Date of Admission: 12/28/2015  Date of Discharge: 01/05/16 Admitting Physician: Uvaldo Rising, MD  Primary Care Provider: Tawni Carnes, MD Consultants: General Surgery, Cardiology  Indication for Hospitalization: DKA  Discharge Diagnoses/Problem List:  T2DM HLD HTN Multiple abscesses of bilateral buttocks HSV-2  Disposition: Home with home health wound care  Discharge Condition: Stable, improved  Discharge Exam:  General: awake, alert Eyes: sclera [place], PERRL, EOMI ENTM: MMM Cardiovascular: RRR, no murmurs Respiratory: normal breathing, CTAB,speaks in full sentences Abdomen: obese, soft, NT/ND, +BS MSK: no edema, moves all extremities Skin: No rashes, dry clean circular lesions present in gluteal cleft with packing in place.   Brief Hospital Course:  Rebecca Rhodes is a 35 year old female who presented to the ED to have an abscess of her buttocks evaluated, but was found to be in DKA. In the ED, glucose was 748, anion gap was 32, VBG showed pH 7.094, pO2 23, pCO2 25.6, bicarb 7.8. UA showed ketones, >1000 glucose, large Hgb. Pt was noted to have Kussmaul breathing. She was admitted to the stepdown unit for further management. Hospital course is described by problem list below.  DKA. We thought her DKA was secondary to her abscesses and not taking her Novolin 70/30 as prescribed. She was supposed to take 50 units bid, but was only taking it daily because she was having trouble affording the medication. HgbA1c was 16.2%. She was started on an insulin drip and we monitored her CBGs q1hr and BMPs q4hrs. When her gap closed x 2, we gave her Lantus 35 units and turned off the drip 2 hours later. She was stable off the drip for ~1 day, but went back into DKA. It was found that she was still receiving dextrose in her  fluids. We increased her Lantus to 45 units and her gap closed. We added Novolog 10 units tid with meals in addition to resistant SSI. She went back into DKA for a third time the night after her surgery, but her anion gap quickly closed. We continued to titrate up her Lantus and Novolog until her blood sugars improved. She was discharged home with samples of Lantus and Novolog until her Medicaid card arrives. Her insulin regimen on discharge was Lantus 60 units daily and Novolog 20 units tid with meals.  Multiple abscesses of the gluteal cleft. Pt was noted to have 2 abscesses on the right side of the gluteal cleft, which both started draining in the ED. WBC 18.0. She received Rocephin, Flagyl, and Vancomycin in the ED. On admission, we switched her to Cipro and Clindamycin. Soft tissue U/S was performed and showed 3.6cm x 2.3cm x 3.7cm abscess on the left buttocks and significant edema without drainable abscess on the right buttocks. General Surgery was consulted and she underwent I&D on 1/20. She was placed back on Vancomycin and Flagyl for ~ 2 days and then transitioned back to Clindamycin. Wound cultures from the I&D grew abundant GP cocci in pairs and clusters, abundant GN rods, and few candida albicans. Fluconazole was added. She was discharged home with home health wound care and a total 7 day course of Clindamycin and Fluconazole. She has a follow-up appointment scheduled with [facility].  Chest pain. On 1/21 (the morning after her surgery), Ms. Onder endorsed new substernal "dull", episodic chest pain. We trended troponins, which were mildly elevated and peaked  at 0.10. EKG showed T wave inversions in leads III and aVF. Cardiology was consulted and they performed a stress test on 1/23. The stress test was low risk. ECHO was performed, which showed an EF of 55-60% and a moderately dilated left atrium. Cardiology recommended continuing her Lisinopril and Lipitor, and adding Lopressor  bid and  ASA  daily.  Social. We consulted Care Management for assistance with affording medications. She applied for Medicaid and is waiting to receive her Medicaid card.  Issues for Follow Up:  1. Patient was given samples of Lantus and Novolog to use until her Medicaid card arrives. She was discharged home on Lantus 60 units daily and Novolog 20 units with meals. Please continue to adjust this regimen as needed until her blood sugars are well-controlled. 2. Please make sure that Ms. Torgeson's wounds are healing and are not showing any signs of infection. Please ensure that she is receiving home health wound care services. 3. It appears that Ms. Down's Lisinopril  daily was accidentally discontinued on discharge. She should be taking the Lisinopril in addition to Lopressor  bid. I called her to let her know that she should restart the Lisinopril. She voiced understanding. Please follow-up with this and make sure she is taking Lisinopril and Lopressor.  Significant Procedures: I&D on 1/21, Lexiscan Myoview on 1/23 (low risk).  Significant Labs and Imaging:   Recent Labs Lab 01/03/16 0524 01/04/16 0934 01/05/16 0546  WBC 13.3* 11.7* 10.9*  HGB 9.5* 9.8* 9.1*  HCT 28.6* 29.8* 28.7*  PLT 291 295 329    Recent Labs Lab 01/01/16 2210 01/02/16 0551 01/03/16 0524 01/04/16 0934 01/05/16 0546  NA 135 136 137 140 140  K 3.9 4.0 3.4* 3.6 3.3*  CL 106 110 108 107 108  CO2 18* 20* 19* 23 25  GLUCOSE 339* 283* 250* 259* 135*  BUN <5* <5* <5* <5* <5*  CREATININE 0.89 1.00 0.79 0.82 0.77  CALCIUM 8.3* 8.1* 8.1* 8.1* 8.0*   UA: few bacteria, >1000 glucose, >80 ketones, trace leukocytes, negative nitrites, 0-5 WBC Hemoglobin A1c: 16.2% Troponins: 0.05 > 0.04 > 0.10 > 0.04 > 0.08 > <0.03 > 0.05 Blood cultures: no growth Wound culture: abundant GP cocci in pairs and clusters, abundant GN rods, few candida albicans   Lexiscan myoview: low risk   Results/Tests Pending at Time of  Discharge: None  Discharge Medications:    Medication List    STOP taking these medications        insulin aspart protamine- aspart (70-30) 100 UNIT/ML injection  Commonly known as:  NOVOLOG MIX 70/30     insulin NPH-regular Human (70-30) 100 UNIT/ML injection  Commonly known as:  NOVOLIN 70/30     lisinopril 40 MG tablet  Commonly known as:  PRINIVIL,ZESTRIL      TAKE these medications        aspirin 81 MG chewable tablet  Chew 1 tablet (81 mg total) by mouth daily.     atorvastatin 40 MG tablet  Commonly known as:  LIPITOR  Take 1 tablet (40 mg total) by mouth daily at 6 PM.     clindamycin 150 MG capsule  Commonly known as:  CLEOCIN  Take 3 capsules (450 mg total) by mouth every 6 (six) hours.  Notes to Patient:  antibiotic     fluconazole 150 MG tablet  Commonly known as:  DIFLUCAN  Take 1 tablet (150 mg total) by mouth daily.     glucose blood test strip  Commonly  known as:  TRUE METRIX BLOOD GLUCOSE TEST  Use as instructed     HYDROcodone-acetaminophen 7.5-325 MG tablet  Commonly known as:  NORCO  Take 1-2 tablets by mouth every 4 (four) hours as needed for moderate pain or severe pain.     insulin aspart 100 UNIT/ML injection  Commonly known as:  novoLOG  Inject 20 Units into the skin 3 (three) times daily with meals.  Notes to Patient:  This is your short acting insulin     insulin glargine 100 UNIT/ML injection  Commonly known as:  LANTUS  Inject 0.6 mLs (60 Units total) into the skin daily.  Notes to Patient:  This is your long acting insulin     metoprolol tartrate 25 MG tablet  Commonly known as:  LOPRESSOR  Take 1 tablet (25 mg total) by mouth 2 (two) times daily.  Notes to Patient:  New blood pressure medication Take in the morning and evening (every 12 hours)     polyethylene glycol packet  Commonly known as:  MIRALAX / GLYCOLAX  Take 17 g by mouth daily.  Notes to Patient:  Laxative to help with bowel movements     senna-docusate 8.6-50  MG tablet  Commonly known as:  Senokot-S  Take 2 tablets by mouth 2 (two) times daily.  Notes to Patient:  Another medication to help keep your bowel movements regular while you are taking pain medication which can constipate you     TRUE METRIX METER Devi  60 Units by Does not apply route daily.        Discharge Instructions: Please refer to Patient Instructions section of EMR for full details.  Patient was counseled important signs and symptoms that should prompt return to medical care, changes in medications, dietary instructions, activity restrictions, and follow up appointments.   Follow-Up Appointments:     Follow-up Information    Follow up with [facility] On 01/27/2016.   Specialty:  General Surgery   Why:  Your appointment is at 10:40 PM, be at the office 30 minutes early for check in.   Contact information:   [address] [place] [address] [phone]       Follow up with Wenda Low, MD On 01/08/2016.   Specialty:  Family Medicine   Why:  hospital follow-up appointment at 2:00pm.   Contact information:   [address] [place] [address] [phone]       Campbell Stall, MD 01/06/2016, 7:42 PM PGY-1, [facility] Family Medicine

## 2016-01-08 ENCOUNTER — Inpatient Hospital Stay: Payer: Medicaid Other | Admitting: Family Medicine

## 2016-01-11 ENCOUNTER — Telehealth: Payer: Self-pay | Admitting: Family Medicine

## 2016-01-11 NOTE — Telephone Encounter (Signed)
[facility] called and would like [place] to call in the patients Lisinopril 40 mg to [facility], because the Child psychotherapist for [facility] is going to pay for this medication since the patient has no money. jw

## 2016-01-12 ENCOUNTER — Ambulatory Visit (INDEPENDENT_AMBULATORY_CARE_PROVIDER_SITE_OTHER): Payer: Self-pay | Admitting: Family Medicine

## 2016-01-12 ENCOUNTER — Inpatient Hospital Stay: Payer: Medicaid Other | Admitting: Family Medicine

## 2016-01-12 ENCOUNTER — Encounter: Payer: Self-pay | Admitting: Family Medicine

## 2016-01-12 VITALS — BP 158/106 | HR 81 | Temp 98.2°F | Ht 64.0 in | Wt 220.0 lb

## 2016-01-12 DIAGNOSIS — E1165 Type 2 diabetes mellitus with hyperglycemia: Secondary | ICD-10-CM

## 2016-01-12 DIAGNOSIS — Z794 Long term (current) use of insulin: Secondary | ICD-10-CM

## 2016-01-12 DIAGNOSIS — E118 Type 2 diabetes mellitus with unspecified complications: Secondary | ICD-10-CM

## 2016-01-12 DIAGNOSIS — IMO0002 Reserved for concepts with insufficient information to code with codable children: Secondary | ICD-10-CM

## 2016-01-12 DIAGNOSIS — I10 Essential (primary) hypertension: Secondary | ICD-10-CM

## 2016-01-12 DIAGNOSIS — L0291 Cutaneous abscess, unspecified: Secondary | ICD-10-CM

## 2016-01-12 DIAGNOSIS — E1310 Other specified diabetes mellitus with ketoacidosis without coma: Secondary | ICD-10-CM

## 2016-01-12 DIAGNOSIS — E1110 Type 2 diabetes mellitus with ketoacidosis without coma: Secondary | ICD-10-CM

## 2016-01-12 LAB — GLUCOSE, CAPILLARY: GLUCOSE-CAPILLARY: 257 mg/dL — AB (ref 65–99)

## 2016-01-12 MED ORDER — LISINOPRIL 40 MG PO TABS: 40.0000 mg | ORAL_TABLET | Freq: Every day | ORAL | Status: DC

## 2016-01-12 NOTE — Assessment & Plan Note (Signed)
Unable to check CBG as she ran out of battery (and has issues affording a new battery). She reports compliance with the insulin and still has samples left for a few more weeks. She is in the process of applying to Medicaid. With a few episodes of reported hypoglycemic symptoms strongly encouraged her to make sure she eats breakfast every day. WIll keep insulin at the current doses. CBG in office 257. Follow up 2 weeks.

## 2016-01-12 NOTE — Progress Notes (Signed)
   Subjective:    Patient ID: Rebecca Rhodes, female    DOB: 02-03-1981, 35 y.o.   MRN: [id number]  HPI  CC: hospital follow up  # Abscess:  Seen in hospital for buttocks abscess, went to OR for drainage. Says she has been doing well with replacing packing, no major pain around the area.  2 more boils around groin appeared, about 1 inch in size, not painful not draining. Finished antibiotics  Has appointment with surgeons on the 15th.  ROS: no fevers, chills, nausea, vomiting  # T1DM  Hasn't been able to check her sugar because the battery ran out.   Insulin: lantus 60 units once in the morning. Using 20 units novolog with each meal.   Has noticed some feelings of low sugar, 1-2 times since leaving hospital, usually in the mornings. Does eat breakfast, if she doesn't eat breakfast she doesn't do the novolog. Notices low blood sugar primarily when not eating.  Has enough samples of insulin to last about 2 more weeks.  ROS: no blurry vision  # Hypertension  Not taking lisinopril (can't afford)  Social Hx: never smoker  Review of Systems   See HPI for ROS.   Past medical history, surgical, family, and social history reviewed and updated in the EMR as appropriate. Objective:  BP 158/106 mmHg  Pulse 81  Temp(Src) 98.2 F (36.8 C) (Oral)  Ht  (1.626 m)  Wt 220 lb (99.791 kg)  BMI 37.74 kg/m2  LMP 12/23/2015 Vitals and nursing note reviewed  General: no apparent distress  CV: normal rate, regular rhythm, no murmurs, rubs or gallop.  Resp: clear to auscultation bilaterally, normal effort Skin: buttocks abscess still open on left and right sides, some drainage noted on bandaging, no surrounding induration or erythema, nontender. There are 2 areas of induration on mons pubis that are nontender, nondaining, nonfluctuant.  Skin exam done with chaperone  Assessment & Plan:  Abscess Off antibiotics, no systematic signs of infection. Area of I&D on both buttocks are open  and seem to be draining/healing well. Continue bandage changes and keep area clean. There are 2 more areas of possible induration on her mons pubis, these do not feel like true infection, no drainage and not tender (I wonder if these were present before and she just noticed them, she had extensive antibiotics in the hospital). Recommended warm compresses 2-3 times daily for this area an keep an eye on it, if develops other symptoms, pain, swelling, drainage to come to the clinic sooner than her 2 week follow up.  Diabetes mellitus type 2, uncontrolled, with complications (HCC) Unable to check CBG as she ran out of battery (and has issues affording a new battery). She reports compliance with the insulin and still has samples left for a few more weeks. She is in the process of applying to Medicaid. With a few episodes of reported hypoglycemic symptoms strongly encouraged her to make sure she eats breakfast every day. WIll keep insulin at the current doses. CBG in office 257. Follow up 2 weeks.  HTN (hypertension) Poor compliance. Not on lisinopril. New rx sent (apparently her social worker will be picking this up for her). Follow up again at 2 weeks.

## 2016-01-12 NOTE — Assessment & Plan Note (Signed)
Off antibiotics, no systematic signs of infection. Area of I&D on both buttocks are open and seem to be draining/healing well. Continue bandage changes and keep area clean. There are 2 more areas of possible induration on her mons pubis, these do not feel like true infection, no drainage and not tender (I wonder if these were present before and she just noticed them, she had extensive antibiotics in the hospital). Recommended warm compresses 2-3 times daily for this area an keep an eye on it, if develops other symptoms, pain, swelling, drainage to come to the clinic sooner than her 2 week follow up.

## 2016-01-12 NOTE — Patient Instructions (Signed)
Continue the lantus 60 units in the morning. Make sure you eat breakfast every single day! This is especially important if you are noticing the low blood sugar symptoms around this time.  It is also really important that you pick up a new battery for your meter so that you can check your sugars 4 times a day. Without knowing your sugars we cannot make any adjustments.  The [employer] [id number] button batteries are around $1 for 2 at [facility]   Continue keeping the abscesses around your buttocks clean with dressing. For the 2 on the front use warm compresses 2-3 times a day. If you develop feevers, swelling, feeling sick please come back to the clinic sooner than your 2 week follow up.

## 2016-01-12 NOTE — Assessment & Plan Note (Signed)
Poor compliance. Not on lisinopril. New rx sent (apparently her social worker will be picking this up for her). Follow up again at 2 weeks.

## 2016-01-13 NOTE — Telephone Encounter (Signed)
This was sent during her visit yesterday.

## 2016-01-18 ENCOUNTER — Telehealth: Payer: Self-pay | Admitting: Family Medicine

## 2016-01-18 NOTE — Telephone Encounter (Signed)
Tiffany who is the home health nurse would like to have verbal orders to continue her home health 1 time a week for 4 weeks. Please call Tiffany [phone]. Myriam Jacobson

## 2016-01-19 NOTE — Telephone Encounter (Signed)
Returned call to [employer] and given verbal order to continue [facility] 1x/week for 4 weeks. -Dr. Waynetta Sandy

## 2016-02-02 ENCOUNTER — Ambulatory Visit: Payer: Medicaid Other | Admitting: Family Medicine

## 2016-02-03 ENCOUNTER — Other Ambulatory Visit: Payer: Self-pay | Admitting: *Deleted

## 2016-02-03 ENCOUNTER — Other Ambulatory Visit: Payer: Self-pay | Admitting: Family Medicine

## 2016-02-03 MED ORDER — SENNOSIDES-DOCUSATE SODIUM 8.6-50 MG PO TABS: 2.0000 | ORAL_TABLET | Freq: Two times a day (BID) | ORAL | Status: DC

## 2016-02-03 MED ORDER — LISINOPRIL 40 MG PO TABS: 40.0000 mg | ORAL_TABLET | Freq: Every day | ORAL | Status: DC

## 2016-02-03 MED ORDER — METOPROLOL TARTRATE 25 MG PO TABS: 25.0000 mg | ORAL_TABLET | Freq: Two times a day (BID) | ORAL | Status: DC

## 2016-02-03 MED ORDER — POLYETHYLENE GLYCOL 3350 17 G PO PACK: 17.0000 g | PACK | Freq: Every day | ORAL | Status: DC

## 2016-02-03 NOTE — Telephone Encounter (Signed)
[facility] called because the patient's Medicaid does not kick in until 02/10/16. The social worker at [facility] is willing to pay for the patient Metoprolol if we can send this to [facility]. The patient has been without this medication since last Thursday. jw

## 2016-02-03 NOTE — Telephone Encounter (Signed)
Will forward to MD. Sandford Diop,CMA  

## 2016-02-04 ENCOUNTER — Telehealth: Payer: Self-pay | Admitting: Family Medicine

## 2016-02-04 MED ORDER — METOPROLOL TARTRATE 25 MG PO TABS: 25.0000 mg | ORAL_TABLET | Freq: Two times a day (BID) | ORAL | Status: DC

## 2016-02-04 NOTE — Telephone Encounter (Signed)
[facility] called and would like [place] to get [facility] to do a transfer Metoprolol to [facility]. That way the social worker will be able to pay for this. jw

## 2016-02-04 NOTE — Telephone Encounter (Signed)
Medication was approved for refill on 02/03/16 by PCP.  Medication sent to [facility].  Clovis Pu, RN

## 2016-02-10 ENCOUNTER — Encounter (HOSPITAL_COMMUNITY): Payer: Self-pay | Admitting: Emergency Medicine

## 2016-02-10 ENCOUNTER — Ambulatory Visit (INDEPENDENT_AMBULATORY_CARE_PROVIDER_SITE_OTHER): Payer: Medicaid Other | Admitting: Internal Medicine

## 2016-02-10 ENCOUNTER — Encounter: Payer: Self-pay | Admitting: Internal Medicine

## 2016-02-10 ENCOUNTER — Inpatient Hospital Stay (HOSPITAL_COMMUNITY)
Admission: EM | Admit: 2016-02-10 | Discharge: 2016-02-13 | DRG: 638 | Disposition: A | Discharge status: Home / Self Care | Payer: Medicaid Other | Attending: Family Medicine | Admitting: Family Medicine

## 2016-02-10 ENCOUNTER — Ambulatory Visit (HOSPITAL_COMMUNITY)
Admission: RE | Admit: 2016-02-10 | Discharge: 2016-02-10 | Disposition: A | Discharge status: Home / Self Care | Payer: MEDICAID | Source: Ambulatory Visit | Attending: Family Medicine | Admitting: Family Medicine

## 2016-02-10 VITALS — BP 155/95 | HR 142 | Temp 98.1°F | Wt 207.0 lb

## 2016-02-10 DIAGNOSIS — E1165 Type 2 diabetes mellitus with hyperglycemia: Secondary | ICD-10-CM | POA: Diagnosis not present

## 2016-02-10 DIAGNOSIS — E1310 Other specified diabetes mellitus with ketoacidosis without coma: Secondary | ICD-10-CM | POA: Diagnosis not present

## 2016-02-10 DIAGNOSIS — Z823 Family history of stroke: Secondary | ICD-10-CM

## 2016-02-10 DIAGNOSIS — Z794 Long term (current) use of insulin: Secondary | ICD-10-CM

## 2016-02-10 DIAGNOSIS — L02212 Cutaneous abscess of back [any part, except buttock]: Secondary | ICD-10-CM | POA: Diagnosis present

## 2016-02-10 DIAGNOSIS — B9562 Methicillin resistant Staphylococcus aureus infection as the cause of diseases classified elsewhere: Secondary | ICD-10-CM | POA: Diagnosis present

## 2016-02-10 DIAGNOSIS — N179 Acute kidney failure, unspecified: Secondary | ICD-10-CM | POA: Diagnosis present

## 2016-02-10 DIAGNOSIS — R000 Tachycardia, unspecified: Secondary | ICD-10-CM | POA: Diagnosis not present

## 2016-02-10 DIAGNOSIS — E118 Type 2 diabetes mellitus with unspecified complications: Secondary | ICD-10-CM

## 2016-02-10 DIAGNOSIS — Z8249 Family history of ischemic heart disease and other diseases of the circulatory system: Secondary | ICD-10-CM

## 2016-02-10 DIAGNOSIS — L0291 Cutaneous abscess, unspecified: Secondary | ICD-10-CM

## 2016-02-10 DIAGNOSIS — IMO0002 Reserved for concepts with insufficient information to code with codable children: Secondary | ICD-10-CM

## 2016-02-10 DIAGNOSIS — I10 Essential (primary) hypertension: Secondary | ICD-10-CM

## 2016-02-10 DIAGNOSIS — E785 Hyperlipidemia, unspecified: Secondary | ICD-10-CM | POA: Insufficient documentation

## 2016-02-10 DIAGNOSIS — Z9114 Patient's other noncompliance with medication regimen: Secondary | ICD-10-CM

## 2016-02-10 DIAGNOSIS — Z833 Family history of diabetes mellitus: Secondary | ICD-10-CM | POA: Diagnosis not present

## 2016-02-10 DIAGNOSIS — E1110 Type 2 diabetes mellitus with ketoacidosis without coma: Secondary | ICD-10-CM

## 2016-02-10 LAB — CBG MONITORING, ED
GLUCOSE-CAPILLARY: 488 mg/dL — AB (ref 65–99)
GLUCOSE-CAPILLARY: 381 mg/dL — AB (ref 65–99)
GLUCOSE-CAPILLARY: 193 mg/dL — AB (ref 65–99)
GLUCOSE-CAPILLARY: 193 mg/dL — AB (ref 65–99)
Glucose-Capillary: 325 mg/dL — ABNORMAL HIGH (ref 65–99)
Glucose-Capillary: 289 mg/dL — ABNORMAL HIGH (ref 65–99)
Glucose-Capillary: 253 mg/dL — ABNORMAL HIGH (ref 65–99)

## 2016-02-10 LAB — URINALYSIS, ROUTINE W REFLEX MICROSCOPIC
Leukocytes, UA: NEGATIVE
Nitrite: NEGATIVE
PH: 5 (ref 5.0–8.0)
PROTEIN: 30 mg/dL — AB
Specific Gravity, Urine: 1.023 (ref 1.005–1.030)

## 2016-02-10 LAB — COMPREHENSIVE METABOLIC PANEL
ALBUMIN: 3 g/dL — AB (ref 3.5–5.0)
ALK PHOS: 103 U/L (ref 38–126)
ALT: 5 U/L — AB (ref 14–54)
AST: 11 U/L — AB (ref 15–41)
Anion gap: 19 — ABNORMAL HIGH (ref 5–15)
BILIRUBIN TOTAL: 1.5 mg/dL — AB (ref 0.3–1.2)
BUN: 15 mg/dL (ref 6–20)
CALCIUM: 9.2 mg/dL (ref 8.9–10.3)
CO2: 9 mmol/L — ABNORMAL LOW (ref 22–32)
CREATININE: 2.11 mg/dL — AB (ref 0.44–1.00)
Chloride: 107 mmol/L (ref 101–111)
GFR calc Af Amer: 34 mL/min — ABNORMAL LOW (ref >60)
GFR calc non Af Amer: 29 mL/min — ABNORMAL LOW (ref >60)
GLUCOSE: 469 mg/dL — AB (ref 65–99)
Potassium: 5.2 mmol/L — ABNORMAL HIGH (ref 3.5–5.1)
Sodium: 135 mmol/L (ref 135–145)
TOTAL PROTEIN: 8.4 g/dL — AB (ref 6.5–8.1)

## 2016-02-10 LAB — CBC WITH DIFFERENTIAL/PLATELET
BASOS ABS: 0 10*3/uL (ref 0.0–0.1)
Basophils Relative: 0 %
Eosinophils Absolute: 0 10*3/uL (ref 0.0–0.7)
Eosinophils Relative: 0 %
HEMATOCRIT: 39.2 % (ref 36.0–46.0)
HEMOGLOBIN: 12.5 g/dL (ref 12.0–15.0)
LYMPHS PCT: 10 %
Lymphs Abs: 2.2 10*3/uL (ref 0.7–4.0)
MCH: 28.8 pg (ref 26.0–34.0)
MCHC: 31.9 g/dL (ref 30.0–36.0)
MCV: 90.3 fL (ref 78.0–100.0)
MONOS PCT: 5 %
Monocytes Absolute: 1.1 10*3/uL — ABNORMAL HIGH (ref 0.1–1.0)
NEUTROS ABS: 18.4 10*3/uL — AB (ref 1.7–7.7)
Neutrophils Relative %: 85 %
Platelets: 361 10*3/uL (ref 150–400)
RBC: 4.34 MIL/uL (ref 3.87–5.11)
RDW: 14.5 % (ref 11.5–15.5)
WBC: 21.7 10*3/uL — ABNORMAL HIGH (ref 4.0–10.5)

## 2016-02-10 LAB — BASIC METABOLIC PANEL
Anion gap: 16 — ABNORMAL HIGH (ref 5–15)
BUN: 12 mg/dL (ref 6–20)
CALCIUM: 9.2 mg/dL (ref 8.9–10.3)
CO2: 14 mmol/L — ABNORMAL LOW (ref 22–32)
CREATININE: 1.35 mg/dL — AB (ref 0.44–1.00)
Chloride: 114 mmol/L — ABNORMAL HIGH (ref 101–111)
GFR, EST AFRICAN AMERICAN: 58 mL/min — AB (ref >60)
GFR, EST NON AFRICAN AMERICAN: 50 mL/min — AB (ref >60)
Glucose, Bld: 191 mg/dL — ABNORMAL HIGH (ref 65–99)
Potassium: 4 mmol/L (ref 3.5–5.1)
SODIUM: 144 mmol/L (ref 135–145)

## 2016-02-10 LAB — URINE MICROSCOPIC-ADD ON

## 2016-02-10 LAB — GLUCOSE, CAPILLARY
Glucose-Capillary: 487 mg/dL — ABNORMAL HIGH (ref 65–99)
Glucose-Capillary: 191 mg/dL — ABNORMAL HIGH (ref 65–99)
Glucose-Capillary: 171 mg/dL — ABNORMAL HIGH (ref 65–99)

## 2016-02-10 LAB — I-STAT VENOUS BLOOD GAS, ED
Acid-base deficit: 20 mmol/L — ABNORMAL HIGH (ref 0.0–2.0)
BICARBONATE: 7.7 meq/L — AB (ref 20.0–24.0)
O2 SAT: 63 %
TCO2: 8 mmol/L (ref 0–100)
pCO2, Ven: 23.7 mmHg — ABNORMAL LOW (ref 45.0–50.0)
pH, Ven: 7.121 — CL (ref 7.250–7.300)
pO2, Ven: 43 mmHg (ref 30.0–45.0)

## 2016-02-10 LAB — PREGNANCY, URINE: Preg Test, Ur: NEGATIVE

## 2016-02-10 MED ORDER — DEXTROSE-NACL 5-0.45 % IV SOLN
INTRAVENOUS | Status: DC
  Administered 2016-02-10: 22:00:00 via INTRAVENOUS

## 2016-02-10 MED ORDER — HYDROMORPHONE HCL 1 MG/ML IJ SOLN
1.0000 mg | Freq: Once | INTRAMUSCULAR | Status: AC
  Administered 2016-02-10: 1 mg via INTRAVENOUS
  Filled 2016-02-10: qty 1

## 2016-02-10 MED ORDER — DEXTROSE-NACL 5-0.45 % IV SOLN
INTRAVENOUS | Status: DC
  Administered 2016-02-10: 20:00:00 via INTRAVENOUS

## 2016-02-10 MED ORDER — SODIUM CHLORIDE 0.9 % IV SOLN
INTRAVENOUS | Status: DC
  Administered 2016-02-10: 12:00:00 via INTRAVENOUS

## 2016-02-10 MED ORDER — LIDOCAINE-EPINEPHRINE (PF) 2 %-1:200000 IJ SOLN: 10.0000 mL | Freq: Once | INTRAMUSCULAR | Status: DC

## 2016-02-10 MED ORDER — SODIUM CHLORIDE 0.9 % IV SOLN: INTRAVENOUS | Status: DC

## 2016-02-10 MED ORDER — HYDRALAZINE HCL 20 MG/ML IJ SOLN
5.0000 mg | INTRAMUSCULAR | Status: DC | PRN
  Administered 2016-02-12: 5 mg via INTRAVENOUS
  Filled 2016-02-10: qty 1

## 2016-02-10 MED ORDER — SODIUM CHLORIDE 0.9 % IV SOLN
INTRAVENOUS | Status: DC
  Administered 2016-02-10: 3.2 [IU]/h via INTRAVENOUS
  Filled 2016-02-10: qty 2.5

## 2016-02-10 MED ORDER — LIDOCAINE HCL (PF) 1 % IJ SOLN
INTRAMUSCULAR | Status: AC
  Administered 2016-02-10: 10 mL
  Filled 2016-02-10: qty 10

## 2016-02-10 MED ORDER — SODIUM CHLORIDE 0.9 % IV SOLN
INTRAVENOUS | Status: DC
  Administered 2016-02-10: 9.2 [IU]/h via INTRAVENOUS
  Filled 2016-02-10: qty 2.5

## 2016-02-10 MED ORDER — SODIUM CHLORIDE 0.9 % IV BOLUS (SEPSIS)
1000.0000 mL | Freq: Once | INTRAVENOUS | Status: AC
  Administered 2016-02-10: 1000 mL via INTRAVENOUS

## 2016-02-10 MED ORDER — ENOXAPARIN SODIUM 40 MG/0.4ML ~~LOC~~ SOLN
40.0000 mg | SUBCUTANEOUS | Status: DC
  Administered 2016-02-10 – 2016-02-12 (×3): 40 mg via SUBCUTANEOUS
  Filled 2016-02-10 (×3): qty 0.4

## 2016-02-10 MED ORDER — OXYCODONE-ACETAMINOPHEN 5-325 MG PO TABS
1.0000 | ORAL_TABLET | Freq: Four times a day (QID) | ORAL | Status: DC | PRN
  Administered 2016-02-10: 1 via ORAL
  Filled 2016-02-10: qty 1

## 2016-02-10 MED ORDER — ONDANSETRON HCL 4 MG/2ML IJ SOLN
4.0000 mg | Freq: Once | INTRAMUSCULAR | Status: AC
  Administered 2016-02-10: 4 mg via INTRAVENOUS
  Filled 2016-02-10: qty 2

## 2016-02-10 MED ORDER — SODIUM CHLORIDE 0.9 % IV SOLN
INTRAVENOUS | Status: DC
  Administered 2016-02-10: 16:00:00 via INTRAVENOUS

## 2016-02-10 MED ORDER — METOPROLOL TARTRATE 25 MG PO TABS
25.0000 mg | ORAL_TABLET | Freq: Two times a day (BID) | ORAL | Status: DC
  Administered 2016-02-10 – 2016-02-13 (×6): 25 mg via ORAL
  Filled 2016-02-10 (×6): qty 1

## 2016-02-10 MED ORDER — HYDROMORPHONE HCL 1 MG/ML IJ SOLN: 1.0000 mg | Freq: Once | INTRAMUSCULAR | Status: DC

## 2016-02-10 NOTE — H&P (Signed)
Family Medicine Teaching [facility] Admission History and Physical Service Pager: [phone]  Patient name: Rebecca Rhodes Medical record number: [id number] Date of birth: 1981-02-08 Age: 35 y.o. Gender: female  Primary Care Provider: Tawni Carnes, MD Consultants: none Code Status: FULL  Chief Complaint: elevated blood sugars, boil on her back  Assessment and Plan: Tija Biss is a 34 y.o. female presenting with elevated blood glucose and skin abscess. PMH is significant for HTN, DM2, HLD, hx of skin abscesses  DKA: secondary to not being able to afford insulin and her abscess. CBG: 469. VBG pH 7.12, pCO2 23.7. PO2 43. Bicarb 7.7. K:5.4 AG 19. UA > 80 ketones and > 1000 glucose  Her last hemoglobin A1c on 12/2015 was 16.2.  Last admitted for DKA in 12/2015 for same reasons. S/p 2L bolus in ED.  - admit to teaching service, stepdown unit, attending Dr. Gwendolyn Grant - NPO except sips with meds - insulin drip - CBG q 1hr, BMP q 2hr, will continue until gap has closed x 2 - IVF NS @ 200cc/hr, then transition to D5NS 150cc/hr when CBG < 250 - discussed ability to obtain medications, patient notes that her Medicaid is now active and she will not have any issues affording her medications in the future.   AKI: Cr 2.11 (Basline 0.7-0.8) Most likely pre-renal in the setting of significant dehydration  - IVF as noted above - monitor with labs - holding home lisinopril  Abscess: s/p drainage in ED resulting in 20cc of purulent fluid. Wbc 21.7. Afebrile - antibiotics not indicated currently - suspect tachycardia is from dehydration, however if this does not improve with IVFs and improvement in metabolic acidosis or the patient develops fevers, low threshold to start antibiotcs  HTN: Elevated with SBP 150-165/ 86-102. Home meds: Metoprolol  BID, Lisinopril  daily. Has not taken BP meds today.  - holding home Lisinopril due to AKI - continue Metoprolol  BID  - Hydralazine PRN   HLD:  -  holding Lipitor 2/2 NPO  FEN/GI: NPO, IVF  Prophylaxis: Lovenox Sub Q   Disposition: admit to teaching service, step down unit   History of Present Illness:  Rebecca Rhodes is a 35 y.o. female presenting with elevated blood sugars, shortness of breath, and a boil on her back.   The patient presented to clinic today for a boil on her back. She was found to be tachycardic with kussmaul breathing and fruity breath. Her CBG in clinic was 487. She was started on fluid bolus and sent to the ED by [facility]. Patient reports that her CBGs were high due not taking her insulin in the last 2 weeks, but has not been able to check her CBGs.   She denies SOB currently (had some SOB this AM). She felt like her heart was racing last night. Denies chest pain, nausea, and vomiting. She's had normal BMs. She has had a poor appetite and last ate last night, notes she drank something this morning without issue. She also endorses malaise.  She had RLQ abdominal pain 2 weeks ago that was sharp in nature but this only occurred once. She's had an abscess on her back that has progressively getting bigger for the last week which prompted her clinic appointment.   No fevers, chills, dysuria. No alcohol use, tobacco use, or illness recently.   She has not taken her BP medications today.   In the ED, patient was noted to be tachycardic on arrival. BP stable. She was given 2L IVF bolus.  Patient's labs were significant for the following: VBG pH 7.1, bicarb 23.7. K 5.2, Cr 2.11, AG 19. WBC 21.7. EKG obtained in clinic showed sinus tachycardia and LVH.    Review Of Systems: Per HPI  Otherwise the remainder of the systems were negative.  Patient Active Problem List   Diagnosis Date Noted  . DKA (diabetic ketoacidoses) (HCC) 02/10/2016  . Pain in the chest   . Demand ischemia (HCC)   . Essential hypertension   . Chest pain   . Elevated troponin   . Abscess   . Abscess of buttock, right 12/29/2015  . Hypertensive urgency  12/29/2015  . Diabetic ketoacidosis without coma associated with type 2 diabetes mellitus (HCC)   . AKI (acute kidney injury) (HCC)   . SOB (shortness of breath)   . Dehydration   . 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 Boil, leg 06/24/2013  . 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 Benign essential hypertension antepartum 02/18/2013  . Acute respiratory failure (HCC) 10/24/2012  . HTN (hypertension) 06/04/2012  . Hyperlipidemia 06/04/2012  . Diabetes mellitus type 2, uncontrolled, with complications (HCC) 06/04/2012    Past Medical History: Past Medical History  Diagnosis Date  . HTN (hypertension) 06/04/2012  . Hyperlipidemia 06/04/2012  . Diabetes mellitus (HCC) 06/04/2012  . HSV-2 infection   . DKA (diabetic ketoacidoses) (HCC)   . Acute respiratory failure [facility])     Past Surgical History: Past Surgical History  Procedure Laterality Date  . Irrigation and debridement abscess  10/31/2012    Procedure: IRRIGATION AND DEBRIDEMENT ABSCESS;  Surgeon: Cherylynn Ridges, MD;  Location: [facility] OR;  Service: General;  Laterality: Bilateral;  . Cesarean section N/A 08/08/2013    Procedure: CESAREAN SECTION;  Surgeon: Tereso Newcomer, MD;  Location: [facility] ORS;  Service: Obstetrics;  Laterality: N/A;  . Incision and drainage abscess Bilateral 01/01/2016    Procedure: INCISION AND DRAINAGE OF BILATERAL GLUTEAL ABSCESSES TIMES THREE;  Surgeon: Gaynelle Adu, MD;  Location: [facility] OR;  Service: General;  Laterality: Bilateral;    Social History: Social History  Substance Use Topics  . Smoking status: Never Smoker   . Smokeless tobacco: Never Used  . Alcohol Use: No   Please also refer to relevant sections of EMR.  Family History: Family History  Problem Relation Age of Onset  . Hypertension Mother   . Stroke Mother   . Heart disease Father   . Diabetes Father   . Cancer Maternal Grandfather   . Hypertension Brother   . Diabetes Paternal Aunt   . Diabetes Paternal Uncle   . Diabetes  Paternal Grandmother   . Diabetes Paternal Grandfather    Allergies and Medications: Allergies  Allergen Reactions  . Penicillins Rash    Has patient had a PCN reaction causing immediate rash, facial/tongue/throat swelling, SOB or lightheadedness with hypotension: Yes Has patient had a PCN reaction causing severe rash involving mucus membranes or skin necrosis: No Has patient had a PCN reaction that required hospitalization No Has patient had a PCN reaction occurring within the last 10 years: No If all of the above answers are "NO", then may proceed with Cephalosporin use.    Current Facility-Administered Medications on File Prior to Encounter  Medication Dose Route Frequency Provider Last Rate Last Dose  . 0.9 %  sodium chloride infusion   Intravenous Continuous Campbell Stall, MD 150 mL/hr at 02/10/16 1155     Current Outpatient Prescriptions on File Prior to Encounter  Medication Sig Dispense Refill  .  aspirin 81 MG chewable tablet Chew 1 tablet (81 mg total) by mouth daily. 30 tablet 11  . lisinopril (PRINIVIL,ZESTRIL) 40 MG tablet Take 1 tablet (40 mg total) by mouth daily. 90 tablet 3  . atorvastatin (LIPITOR) 40 MG tablet Take 1 tablet (40 mg total) by mouth daily at 6 PM. (Patient not taking: Reported on 02/10/2016) 30 tablet 11  . Blood Glucose Monitoring Suppl (TRUE METRIX METER) DEVI 60 Units by Does not apply route daily. 1 Device 0  . glucose blood (TRUE METRIX BLOOD GLUCOSE TEST) test strip Use as instructed 100 each 12  . HYDROcodone-acetaminophen (NORCO) 7.5-325 MG tablet Take 1-2 tablets by mouth every 4 (four) hours as needed for moderate pain or severe pain. (Patient not taking: Reported on 02/10/2016) 15 tablet 0  . insulin aspart (NOVOLOG) 100 UNIT/ML injection Inject 20 Units into the skin 3 (three) times daily with meals. (Patient not taking: Reported on 02/10/2016) 10 mL 11  . insulin glargine (LANTUS) 100 UNIT/ML injection Inject 0.6 mLs (60 Units total) into the skin  daily. (Patient not taking: Reported on 02/10/2016) 10 mL 11  . metoprolol tartrate (LOPRESSOR) 25 MG tablet Take 1 tablet (25 mg total) by mouth 2 (two) times daily. 60 tablet 5  . polyethylene glycol (MIRALAX / GLYCOLAX) packet Take 17 g by mouth daily. (Patient not taking: Reported on 02/10/2016) 30 each 11  . senna-docusate (SENOKOT-S) 8.6-50 MG tablet Take 2 tablets by mouth 2 (two) times daily. (Patient not taking: Reported on 02/10/2016) 60 tablet 5    Objective: BP 118/70 mmHg  Pulse 116  Temp(Src) 98.3 F (36.8 C) (Oral)  Resp 28  Ht  (1.626 m)  Wt 93.895 kg (207 lb)  BMI 35.51 kg/m2  SpO2 96%  LMP 02/07/2016 (Approximate) Exam: General: NAD, slightly sleepy/tired looking  Eyes: miotic pupils likely due to pain meds received in ED ENTM: dry cracked lips, dry MM Neck: normal ROM Cardiovascular: tachycardia, regular rhythm, no m/r/g Respiratory: tachypnea with some Kussmaul breathing, CTAB  Abdomen: +BS, soft, NT,ND MSK: able to move all extremities  Skin: warm and dry, cap refill ~3 seconds, no skin tenting; Drained abscess on left flank area with induration surrounding, very mild erythema, no warmth.  Neuro: alert, answers questions appropriately  Psych: Awake, alert, no focal deficits grossly, normal speech  Labs and Imaging: CBC BMET   Recent Labs Lab 02/10/16 1312  WBC 21.7*  HGB 12.5  HCT 39.2  PLT 361    Recent Labs Lab 02/10/16 1312  NA 135  K 5.2*  CL 107  CO2 9*  BUN 15  CREATININE 2.11*  GLUCOSE 469*  CALCIUM 9.2    EKG: HR 134 sinus tachycardia, LVH Urine beta hcg: neg UA: > 1000 glucose, trace hgb, mod bili, >80 ketones, 30 protein   Palma Holter, MD 02/10/2016, 2:14 PM PGY-1, [facility] FPTS Intern pager: [phone], text pages welcome  Upper Level Addendum:  I have seen and evaluated this patient along with Dr. Ottie Glazier and reviewed the above note, making necessary revisions in purple.   Joanna Puff,  MD [facility] Resident, PGY-2

## 2016-02-10 NOTE — ED Provider Notes (Addendum)
CSN: [id number]     Arrival date & time 02/10/16  1156 History   First MD Initiated Contact with Patient 02/10/16 1156     Chief Complaint  Patient presents with  . Diabetic Ketoacidosis     (Consider location/radiation/quality/duration/timing/severity/associated sxs/prior Treatment) HPI Comments: Patient is a 58 rolled female with a history of known diabetes on insulin and Lantus to his not been taking her medications for the last 2 weeks presenting from family practice clinic today with DKA. Patient states for the last 1 week she's had worsening pain, swelling and redness in her left back consistent with a boil. She denies any fever but today started noticing shortness of breath and generally feeling unwell. She denies any vomiting or abdominal pain. No dysuria. Last menses was last week. Patient states she's not been taking her diabetic medication because she is waiting for her insurance to start because she cannot afford it. No cough or respiratory symptoms other than shortness of breath.  The history is provided by the patient.    Past Medical History  Diagnosis Date  . HTN (hypertension) 06/04/2012  . Hyperlipidemia 06/04/2012  . Diabetes mellitus (HCC) 06/04/2012  . HSV-2 infection   . DKA (diabetic ketoacidoses) (HCC)   . Acute respiratory failure [facility])    Past Surgical History  Procedure Laterality Date  . Irrigation and debridement abscess  10/31/2012    Procedure: IRRIGATION AND DEBRIDEMENT ABSCESS;  Surgeon: Cherylynn Ridges, MD;  Location: [facility] OR;  Service: General;  Laterality: Bilateral;  . Cesarean section N/A 08/08/2013    Procedure: CESAREAN SECTION;  Surgeon: Tereso Newcomer, MD;  Location: [facility] ORS;  Service: Obstetrics;  Laterality: N/A;  . Incision and drainage abscess Bilateral 01/01/2016    Procedure: INCISION AND DRAINAGE OF BILATERAL GLUTEAL ABSCESSES TIMES THREE;  Surgeon: Gaynelle Adu, MD;  Location: [facility] OR;  Service: General;  Laterality: Bilateral;   Family History    Problem Relation Age of Onset  . Hypertension Mother   . Stroke Mother   . Heart disease Father   . Diabetes Father   . Cancer Maternal Grandfather   . Hypertension Brother   . Diabetes Paternal Aunt   . Diabetes Paternal Uncle   . Diabetes Paternal Grandmother   . Diabetes Paternal Grandfather    Social History  Substance Use Topics  . Smoking status: Never Smoker   . Smokeless tobacco: Never Used  . Alcohol Use: No   OB History    Gravida Para Term Preterm AB TAB SAB Ectopic Multiple Living   1 1  1      1      Review of Systems  All other systems reviewed and are negative.     Allergies  Penicillins  Home Medications   Prior to Admission medications   Medication Sig Start Date End Date Taking? Authorizing Provider  acetaminophen (TYLENOL) 500 MG tablet Take 1,000 mg by mouth every 6 (six) hours as needed for mild pain.   Yes Historical Provider, MD  aspirin 81 MG chewable tablet Chew 1 tablet (81 mg total) by mouth daily. 01/05/16  Yes Campbell Stall, MD  lisinopril (PRINIVIL,ZESTRIL) 40 MG tablet Take 1 tablet (40 mg total) by mouth daily. 02/03/16  Yes Nani Ravens, MD  PRESCRIPTION MEDICATION Take 2 tablets by mouth every 6 (six) hours as needed (pain). Pain medication that starts with "M"   Yes Historical Provider, MD  atorvastatin (LIPITOR) 40 MG tablet Take 1 tablet (40 mg total) by mouth  daily at 6 PM. Patient not taking: Reported on 02/10/2016 01/05/16   Campbell Stall, MD  Blood Glucose Monitoring Suppl (TRUE METRIX METER) DEVI 60 Units by Does not apply route daily. 01/05/16   Myra Rude, MD  glucose blood (TRUE METRIX BLOOD GLUCOSE TEST) test strip Use as instructed 01/05/16   Myra Rude, MD  HYDROcodone-acetaminophen [facility]) 7.5-325 MG tablet Take 1-2 tablets by mouth every 4 (four) hours as needed for moderate pain or severe pain. Patient not taking: Reported on 02/10/2016 01/05/16   Myra Rude, MD  insulin aspart (NOVOLOG) 100 UNIT/ML  injection Inject 20 Units into the skin 3 (three) times daily with meals. Patient not taking: Reported on 02/10/2016 01/05/16   Myra Rude, MD  insulin glargine (LANTUS) 100 UNIT/ML injection Inject 0.6 mLs (60 Units total) into the skin daily. Patient not taking: Reported on 02/10/2016 01/05/16   Myra Rude, MD  metoprolol tartrate (LOPRESSOR) 25 MG tablet Take 1 tablet (25 mg total) by mouth 2 (two) times daily. 02/04/16   Nani Ravens, MD  polyethylene glycol [facility]) packet Take 17 g by mouth daily. Patient not taking: Reported on 02/10/2016 02/03/16   Nani Ravens, MD  senna-docusate (SENOKOT-S) 8.6-50 MG tablet Take 2 tablets by mouth 2 (two) times daily. Patient not taking: Reported on 02/10/2016 02/03/16   Nani Ravens, MD   BP 118/70 mmHg  Pulse 116  Temp(Src) 98.3 F (36.8 C) (Oral)  Resp 28  Ht  (1.626 m)  Wt 207 lb (93.895 kg)  BMI 35.51 kg/m2  SpO2 96%  LMP 02/07/2016 (Approximate) Physical Exam  Constitutional: She is oriented to person, place, and time. She appears well-developed and well-nourished. No distress.  Smells of ketones  HENT:  Head: Normocephalic and atraumatic.  Mouth/Throat: Oropharynx is clear and moist.  Eyes: Conjunctivae and EOM are normal. Pupils are equal, round, and reactive to light.  Neck: Normal range of motion. Neck supple.  Cardiovascular: Regular rhythm and intact distal pulses.  Tachycardia present.   No murmur heard. Pulmonary/Chest: Effort normal and breath sounds normal. Tachypnea noted. No respiratory distress. She has no wheezes. She has no rales.  Abdominal: Soft. She exhibits no distension. There is no tenderness. There is no rebound and no guarding.  Musculoskeletal: Normal range of motion. She exhibits no edema or tenderness.  Neurological: She is alert and oriented to person, place, and time.  Skin: Skin is warm and dry. No rash noted. No erythema.     Psychiatric: She has a normal mood and affect. Her  behavior is normal.  Nursing note and vitals reviewed.   ED Course  Procedures (including critical care time) Labs Review Labs Reviewed  CBC WITH DIFFERENTIAL/PLATELET - Abnormal; Notable for the following:    WBC 21.7 (*)    All other components within normal limits  COMPREHENSIVE METABOLIC PANEL - Abnormal; Notable for the following:    Potassium 5.2 (*)    CO2 9 (*)    Glucose, Bld 469 (*)    Creatinine, Ser 2.11 (*)    Total Protein 8.4 (*)    Albumin 3.0 (*)    AST 11 (*)    ALT 5 (*)    Total Bilirubin 1.5 (*)    GFR calc non Af Amer 29 (*)    GFR calc Af Amer 34 (*)    Anion gap 19 (*)    All other components within normal limits  CBG MONITORING, ED -  Abnormal; Notable for the following:    Glucose-Capillary 488 (*)    All other components within normal limits  I-STAT VENOUS BLOOD GAS, ED - Abnormal; Notable for the following:    pH, Ven 7.121 (*)    pCO2, Ven 23.7 (*)    Bicarbonate 7.7 (*)    Acid-base deficit 20.0 (*)    All other components within normal limits  URINALYSIS, ROUTINE W REFLEX MICROSCOPIC (NOT AT [facility])  PREGNANCY, URINE    Imaging Review No results found. I have personally reviewed and evaluated these images and lab results as part of my medical decision-making.  INCISION AND DRAINAGE Performed by: Gwyneth Sprout Consent: Verbal consent obtained. Risks and benefits: risks, benefits and alternatives were discussed Type: abscess  Body area: left back  Anesthesia: local infiltration  Incision was made with a scalpel.  Local anesthetic: lidocaine 2% without epinephrine  Anesthetic total: 9 ml  Complexity: complex Blunt dissection to break up loculations  Drainage: purulent  Drainage amount: 20mL  Packing material: 1/4 in iodoform gauze  Patient tolerance: Patient tolerated the procedure well with no immediate complications.     MDM   Final diagnoses:  Diabetic ketoacidosis without coma associated with type 2 diabetes  mellitus (HCC)  Abscess    Patient is a 35 rolled female with a history of diabetes presenting today with symptoms concerning for DKA including an abscess on her back.  Patient states the abscess has been ongoing for a proximally 1 week and she has not taken her insulin or Lantus for the last 2 weeks because she was waiting for Medicaid to kick in. Patient is tachycardic today but mentating normally and denies any vomiting or abdominal pain. She has a large abscess on her left back which was drained and only minimal cellulitis. Approximate 20 mL of pus removed. Labs indicate DKA with a pH of 7.12 and a bicarbonate of 7. Initial blood sugar was 488 and patient was given 1 L bolus. Her creatinine also shows acute renal failure with a creatinine of 2.10 from a baseline of less than 1.  Patient started on glucose stabilizer. Will admit for further care.  CRITICAL CARE Performed by: Gwyneth Sprout Total critical care time: 30 minutes Critical care time was exclusive of separately billable procedures and treating other patients. Critical care was necessary to treat or prevent imminent or life-threatening deterioration. Critical care was time spent personally by me on the following activities: development of treatment plan with patient and/or surrogate as well as nursing, discussions with consultants, evaluation of patient's response to treatment, examination of patient, obtaining history from patient or surrogate, ordering and performing treatments and interventions, ordering and review of laboratory studies, ordering and review of radiographic studies, pulse oximetry and re-evaluation of patient's condition.     Gwyneth Sprout, MD 02/10/16 1408  Gwyneth Sprout, MD 02/10/16 1409  Gwyneth Sprout, MD 02/10/16 1414

## 2016-02-10 NOTE — ED Notes (Signed)
Bolus continuing to be administered that was initiated at MD office.

## 2016-02-10 NOTE — Progress Notes (Signed)
[facility] Phone: [phone]  Subjective:  Boil: Located on her upper back. Has been there for 1 week. Has tried warm compresses and Tylenol, which didn't help. She had some leftover Norco from her last I&D. These helped better than the Tylenol. She has had more than 10 boils in her life. She denies any nausea, vomiting, fever, or chills.   Shortness of Breath: Started this morning when she woke up. She has had shortness of breath before, usually when her sugars are really high. She has not taken any insulin in 2 weeks, because she was waiting for her Medicaid to kick in. She is supposed to be taking Lantus 60 units daily and Novolin 20 units tid with meals. She hasn't been urinating a lot. She has been very thirsty. No confusion or altered mental status. She has felt "sluggish" and "fatigued". No cough, congestion, or runny nose.  ROS: See HPI for pertinent positives and negatives Past Medical History- T2DM, HTN, HLD Reviewed problem list.  Medications- reviewed and updated Current Outpatient Prescriptions  Medication Sig Dispense Refill  . aspirin 81 MG chewable tablet Chew 1 tablet (81 mg total) by mouth daily. 30 tablet 11  . atorvastatin (LIPITOR) 40 MG tablet Take 1 tablet (40 mg total) by mouth daily at 6 PM. 30 tablet 11  . Blood Glucose Monitoring Suppl (TRUE METRIX METER) DEVI 60 Units by Does not apply route daily. 1 Device 0  . clindamycin (CLEOCIN) 150 MG capsule Take 3 capsules (450 mg total) by mouth every 6 (six) hours. 16 capsule 0  . fluconazole (DIFLUCAN) 150 MG tablet Take 1 tablet (150 mg total) by mouth daily. 4 tablet 0  . glucose blood (TRUE METRIX BLOOD GLUCOSE TEST) test strip Use as instructed 100 each 12  . HYDROcodone-acetaminophen (NORCO) 7.5-325 MG tablet Take 1-2 tablets by mouth every 4 (four) hours as needed for moderate pain or severe pain. 15 tablet 0  . insulin aspart (NOVOLOG) 100 UNIT/ML injection Inject 20 Units into the skin  3 (three) times daily with meals. 10 mL 11  . insulin glargine (LANTUS) 100 UNIT/ML injection Inject 0.6 mLs (60 Units total) into the skin daily. 10 mL 11  . lisinopril (PRINIVIL,ZESTRIL) 40 MG tablet Take 1 tablet (40 mg total) by mouth daily. 90 tablet 3  . metoprolol tartrate (LOPRESSOR) 25 MG tablet Take 1 tablet (25 mg total) by mouth 2 (two) times daily. 60 tablet 5  . polyethylene glycol (MIRALAX / GLYCOLAX) packet Take 17 g by mouth daily. 30 each 11  . senna-docusate (SENOKOT-S) 8.6-50 MG tablet Take 2 tablets by mouth 2 (two) times daily. 60 tablet 5   No current facility-administered medications for this visit.   Chief complaint-noted Family history reviewed for today's visit. No changes. Social history- patient is a never smoker  Objective: BP 155/95 mmHg  Pulse 142  Temp(Src) 98.1 F (36.7 C) (Oral)  Wt 207 lb (93.895 kg)  SpO2 100%  LMP 02/07/2016 (Approximate) Gen: Tired-appearing, but non-toxic HEENT: NCAT, EOMI, dry mucous membranes, crackled lips, breath has fruity odor Neck: FROM, supple Back: 10 cm x 5 cm abscess present over left upper back CV: Tachycardic, regular rhythm, no murmur Resp: Increased work of breathing, but lungs are clear bilaterally, no wheezing GI: SNTND, BS present, no guarding or organomegaly Msk: No edema, warm, normal tone, moves UE/LE spontaneously Neuro: Alert and oriented, no gross deficits Skin: No rashes, no lesions Psych: Appropriate behavior  Assessment/Plan: Diabetic ketoacidosis in the  setting of T2DM Pt has not taken any Lantus or Novolog in 2 weeks. Normally takes Lantus 60 units daily and Novolog 20 units tid with meals. She also has an abscess on her left back that has likely contributed to her DKA. She clinically appears very dehydrated with dry, crackled lips and tachycardia to 142. She also has Kussmal breathing and her breath has a fruity odor. - Will send to ED via [facility] for further management, as there are no  stepdown beds currently available. Pt will need to be admitted to stepdown from the ED. - CBG obtained in the office and was 487.  - IV placed and 1L NS bolus was started in route to the ED - EKG performed in the setting of tachycardia and showed sinus tachycardia - Pt will need refills of Lantus, Novolog, glucometer, strips, and lancets on discharge from hospital - Precepted with Dr. Si Gaul, MD PGY-1

## 2016-02-10 NOTE — Assessment & Plan Note (Addendum)
Pt has not taken any Lantus or Novolog in 2 weeks. Normally takes Lantus 60 units daily and Novolog 20 units tid with meals. She also has an abscess on her left back that has likely contributed to her DKA. She clinically appears very dehydrated with dry, crackled lips and tachycardia to 142. She also has Kussmal breathing and her breath has a fruity odor. - Will send to ED via [facility] for further management, as there are no stepdown beds currently available. Pt will need to be admitted to stepdown from the ED. - CBG obtained in the office and was 487.  - IV placed and 1L NS bolus was started in route to the ED - EKG performed in the setting of tachycardia and showed sinus tachycardia - Pt will need refills of Lantus, Novolog, glucometer, strips, and lancets on discharge from hospital - Precepted with Dr. Pollie Meyer

## 2016-02-10 NOTE — H&P (Signed)
FMTS Attending Admission Note: Rebecca Don MD Personal pager:  [phone] FPTS Service Pager:  [phone]  HPI:  Patient with known type 2 diabetes mellitus insulin-dependent, hypertension, hyperlipidemia who presents with one-day history of shortness of breath and several days of malaise. She has been out of her insulin for the past 1-2 weeks because of financial difficulties. States she has felt increasingly tired at home. Has not had any nausea or vomiting but not has not had an appetite. Last time she was yesterday morning. She is able to drink fluids well yesterday. Due to degree of difficulty breathing and continue malaise she presented to clinic this morning. She was sent to the emergency department she was found to have Kussmaul breathing and concern for DKA. CBG at that time was 487.  Exam: Gen:  AAF lying in bed. Still with Kussmaul breathing. Tired appearing. HEENT: PERRLA. Extra ocular is intact. Still very dry mucous membranes Neck: No JVD Heart: Tachycardic with regular rhythm Lungs: Clear throughout Abdomen minimally tender in epigastrium. Otherwise benign. Extremities: No lower extremity edema. Distal pulses are intact Neuro: Alert and oriented 4. No focal deficits noted. Back: Abscess on left flank is Status post incision and drainage. Some surrounding erythema. Bandages clean dry and intact.  Impression/plan: 1. HHS: -Secondary to nonadherence to medication secondary to financial reasons. - Acidotic based on metabolic bicarbonate as well as venous pH. She currently has insulin running as well as IV fluids. -Anion gap is 19. Follow metabolic profile every 2 hours until gap closes. -Continue insulin until gap closes. - Admit to step-down unit.   2.  AKI: - Likely secondary marketed dehydration. -Baseline currently appears normal. -Continue to rehydrate and follow serial creatinines  #3. Leukocytosis: -Likely secondary to margination -Also possibly secondary to  abscess. -Tachycardic likely secondary to DKA. -Possibly also early systemic symptoms and will treat as such.  4. Abscess of back: -Status post drainage in the emergency department.  I will sign resident note when complete.  Tobey Grim, MD 02/10/2016 4:38 PM

## 2016-02-10 NOTE — ED Notes (Signed)
Placed dressing over pt's lanced abscess.

## 2016-02-10 NOTE — Progress Notes (Signed)
Pt arrived to 2C01 at 2115. Pt is Al/O x4, ST at 115, complains of back pain from where boil was lanced in the ED.  RN will continue to monitor.

## 2016-02-10 NOTE — ED Notes (Signed)
Pt went to family practice today to have I&D of boil on back. Pt SOB while at appointment- denied CP, CBG 487. MD office started IV and NS bolus. HR 140's ST, BP 132/88.

## 2016-02-11 LAB — GLUCOSE, CAPILLARY
GLUCOSE-CAPILLARY: 157 mg/dL — AB (ref 65–99)
Glucose-Capillary: 127 mg/dL — ABNORMAL HIGH (ref 65–99)
Glucose-Capillary: 129 mg/dL — ABNORMAL HIGH (ref 65–99)
Glucose-Capillary: 145 mg/dL — ABNORMAL HIGH (ref 65–99)
Glucose-Capillary: 155 mg/dL — ABNORMAL HIGH (ref 65–99)
Glucose-Capillary: 156 mg/dL — ABNORMAL HIGH (ref 65–99)
Glucose-Capillary: 195 mg/dL — ABNORMAL HIGH (ref 65–99)
Glucose-Capillary: 314 mg/dL — ABNORMAL HIGH (ref 65–99)
Glucose-Capillary: 341 mg/dL — ABNORMAL HIGH (ref 65–99)
Glucose-Capillary: 373 mg/dL — ABNORMAL HIGH (ref 65–99)

## 2016-02-11 LAB — BASIC METABOLIC PANEL
ANION GAP: 10 (ref 5–15)
ANION GAP: 12 (ref 5–15)
ANION GAP: 11 (ref 5–15)
ANION GAP: 9 (ref 5–15)
Anion gap: 14 (ref 5–15)
BUN: 9 mg/dL (ref 6–20)
BUN: 10 mg/dL (ref 6–20)
BUN: 12 mg/dL (ref 6–20)
BUN: 10 mg/dL (ref 6–20)
BUN: 11 mg/dL (ref 6–20)
CALCIUM: 8.8 mg/dL — AB (ref 8.9–10.3)
CHLORIDE: 113 mmol/L — AB (ref 101–111)
CHLORIDE: 109 mmol/L (ref 101–111)
CHLORIDE: 109 mmol/L (ref 101–111)
CHLORIDE: 114 mmol/L — AB (ref 101–111)
CHLORIDE: 116 mmol/L — AB (ref 101–111)
CO2: 14 mmol/L — AB (ref 22–32)
CO2: 13 mmol/L — ABNORMAL LOW (ref 22–32)
CO2: 14 mmol/L — AB (ref 22–32)
CO2: 15 mmol/L — AB (ref 22–32)
CO2: 16 mmol/L — AB (ref 22–32)
CREATININE: 1.13 mg/dL — AB (ref 0.44–1.00)
Calcium: 8.9 mg/dL (ref 8.9–10.3)
Calcium: 8.5 mg/dL — ABNORMAL LOW (ref 8.9–10.3)
Calcium: 8.8 mg/dL — ABNORMAL LOW (ref 8.9–10.3)
Calcium: 8.8 mg/dL — ABNORMAL LOW (ref 8.9–10.3)
Creatinine, Ser: 1.07 mg/dL — ABNORMAL HIGH (ref 0.44–1.00)
Creatinine, Ser: 1.08 mg/dL — ABNORMAL HIGH (ref 0.44–1.00)
Creatinine, Ser: 1.11 mg/dL — ABNORMAL HIGH (ref 0.44–1.00)
Creatinine, Ser: 1.23 mg/dL — ABNORMAL HIGH (ref 0.44–1.00)
GFR calc Af Amer: >60 mL/min (ref >60)
GFR calc Af Amer: >60 mL/min (ref >60)
GFR calc non Af Amer: >60 mL/min (ref >60)
GFR calc non Af Amer: >60 mL/min (ref >60)
GFR calc non Af Amer: >60 mL/min (ref >60)
GFR calc non Af Amer: >60 mL/min (ref >60)
GFR calc non Af Amer: 56 mL/min — ABNORMAL LOW (ref >60)
GLUCOSE: 367 mg/dL — AB (ref 65–99)
GLUCOSE: 324 mg/dL — AB (ref 65–99)
Glucose, Bld: 165 mg/dL — ABNORMAL HIGH (ref 65–99)
Glucose, Bld: 152 mg/dL — ABNORMAL HIGH (ref 65–99)
Glucose, Bld: 274 mg/dL — ABNORMAL HIGH (ref 65–99)
POTASSIUM: 3.9 mmol/L (ref 3.5–5.1)
POTASSIUM: 3.8 mmol/L (ref 3.5–5.1)
POTASSIUM: 3.8 mmol/L (ref 3.5–5.1)
POTASSIUM: 3.3 mmol/L — AB (ref 3.5–5.1)
Potassium: 3.3 mmol/L — ABNORMAL LOW (ref 3.5–5.1)
Sodium: 141 mmol/L (ref 135–145)
Sodium: 140 mmol/L (ref 135–145)
Sodium: 137 mmol/L (ref 135–145)
Sodium: 136 mmol/L (ref 135–145)
Sodium: 135 mmol/L (ref 135–145)

## 2016-02-11 LAB — MRSA PCR SCREENING: MRSA by PCR: NEGATIVE

## 2016-02-11 LAB — CBC
HEMATOCRIT: 34.8 % — AB (ref 36.0–46.0)
Hemoglobin: 11.3 g/dL — ABNORMAL LOW (ref 12.0–15.0)
MCH: 28.5 pg (ref 26.0–34.0)
MCHC: 32.5 g/dL (ref 30.0–36.0)
MCV: 87.7 fL (ref 78.0–100.0)
PLATELETS: 338 10*3/uL (ref 150–400)
RBC: 3.97 MIL/uL (ref 3.87–5.11)
RDW: 14.6 % (ref 11.5–15.5)
WBC: 18.1 10*3/uL — ABNORMAL HIGH (ref 4.0–10.5)

## 2016-02-11 MED ORDER — INSULIN ASPART 100 UNIT/ML ~~LOC~~ SOLN
0.0000 [IU] | Freq: Every day | SUBCUTANEOUS | Status: DC
  Administered 2016-02-11: 4 [IU] via SUBCUTANEOUS
  Administered 2016-02-12: 3 [IU] via SUBCUTANEOUS

## 2016-02-11 MED ORDER — SODIUM CHLORIDE 0.9 % IV SOLN
INTRAVENOUS | Status: DC
  Administered 2016-02-11 – 2016-02-13 (×3): via INTRAVENOUS

## 2016-02-11 MED ORDER — SULFAMETHOXAZOLE-TRIMETHOPRIM 400-80 MG PO TABS
1.0000 | ORAL_TABLET | Freq: Two times a day (BID) | ORAL | Status: DC
  Administered 2016-02-11 – 2016-02-13 (×5): 1 via ORAL
  Filled 2016-02-11 (×9): qty 1

## 2016-02-11 MED ORDER — INSULIN ASPART 100 UNIT/ML ~~LOC~~ SOLN
0.0000 [IU] | Freq: Three times a day (TID) | SUBCUTANEOUS | Status: DC
  Administered 2016-02-11: 20 [IU] via SUBCUTANEOUS
  Administered 2016-02-11: 15 [IU] via SUBCUTANEOUS

## 2016-02-11 MED ORDER — OXYCODONE-ACETAMINOPHEN 5-325 MG PO TABS
1.0000 | ORAL_TABLET | ORAL | Status: DC | PRN
  Administered 2016-02-11 – 2016-02-12 (×2): 1 via ORAL
  Filled 2016-02-11 (×2): qty 1

## 2016-02-11 MED ORDER — INSULIN GLARGINE 100 UNIT/ML ~~LOC~~ SOLN
50.0000 [IU] | SUBCUTANEOUS | Status: DC
  Administered 2016-02-12: 50 [IU] via SUBCUTANEOUS
  Filled 2016-02-11 (×3): qty 0.5

## 2016-02-11 MED ORDER — POTASSIUM CHLORIDE CRYS ER 20 MEQ PO TBCR
20.0000 meq | EXTENDED_RELEASE_TABLET | Freq: Once | ORAL | Status: AC
  Administered 2016-02-11: 20 meq via ORAL
  Filled 2016-02-11: qty 1

## 2016-02-11 MED ORDER — INSULIN GLARGINE 100 UNIT/ML ~~LOC~~ SOLN
35.0000 [IU] | SUBCUTANEOUS | Status: DC
  Administered 2016-02-11: 35 [IU] via SUBCUTANEOUS
  Filled 2016-02-11: qty 0.35

## 2016-02-11 MED ORDER — INSULIN ASPART 100 UNIT/ML ~~LOC~~ SOLN
0.0000 [IU] | Freq: Three times a day (TID) | SUBCUTANEOUS | Status: DC
  Administered 2016-02-11: 3 [IU] via SUBCUTANEOUS

## 2016-02-11 MED ORDER — INSULIN ASPART 100 UNIT/ML ~~LOC~~ SOLN: 0.0000 [IU] | Freq: Every day | SUBCUTANEOUS | Status: DC

## 2016-02-11 MED ORDER — OXYCODONE-ACETAMINOPHEN 5-325 MG PO TABS
1.0000 | ORAL_TABLET | Freq: Four times a day (QID) | ORAL | Status: AC | PRN
  Administered 2016-02-11: 1 via ORAL
  Filled 2016-02-11: qty 1

## 2016-02-11 NOTE — Progress Notes (Signed)
Lantus was given at 0458 and insulin gtt stopped at 0552.  D5 1/2 NS is still infusing at 150 ml/hr per MD

## 2016-02-11 NOTE — Progress Notes (Signed)
Patient transferring to 5W. Report called to receiving nurse. All questions answered. 

## 2016-02-11 NOTE — Care Management Note (Signed)
Case Management Note  Patient Details  Name: Rebecca Rhodes MRN: [id number] Date of Birth: 06/01/1981  Subjective/Objective:    Pt lives with a friend and his mother, has [employer].  She can purchase medications on the Medicaid Preferred List for $3.00 copay.  List of medicaid preferred hypoglycemics placed in shadow chart.                               Expected Discharge Plan:  Home/Self Care  Discharge planning Services  CM Consult  Status of Service:  Completed, signed off  [place]ene [place], [place] 02/11/2016, 11:45 AM

## 2016-02-11 NOTE — Progress Notes (Addendum)
FMTS Attending Daily Note:  Renold Don MD   S:  Awake and alert this AM.  Sitting up in bed eating breakfast.  No complaints.  Exam:  Gen:  Alert, WD-WN.  NAD Heart:  Minimally tachycardic on my exam.  Regular rhythm.   Lungs:  Clear throughout.   Abd:  Soft/NT, benign  Imp/Plan: 1. DKA: - resolved.  Looks much better both subjectively and objectively.  CBGs increasing, will increase Lantus.  - anion gap closed.  She has received her subQ Lantus.  Drip has been stopped.   - Fluids have been stopped at the bedside, but are still written for.   - She is now eating.   - Need to increase fluids as she still appears dry.  - Ok to tx to floor  2.  Abscess: - s/p drainage - still with tachycardia.  - leukocytosis from yesterday noted.  Likely secondary to demargination -- repeat CBC today   3.  Tachycardia:  - persists despite resolution of DKA.  - again, checking WBC today  4.Hypokalemia:   - total body deficiency 2/2 DKA.  Will need more than 20 mEq  5.  AKI: - resolving.  Secondary to dehydration.  - increasing fluids.   Tobey Grim, MD 02/11/2016 9:31 AM

## 2016-02-11 NOTE — Progress Notes (Signed)
Utilization Review Completed.  

## 2016-02-11 NOTE — Progress Notes (Signed)
Received from 2C 35 YO female voices no c/o

## 2016-02-11 NOTE — Progress Notes (Signed)
Family Medicine Teaching Service Daily Progress Note Intern Pager: [phone]  Patient name: Rebecca Rhodes Medical record number: [id number] Date of birth: Sep 08, 1981 Age: 35 y.o. Gender: female  Primary Care Provider: Tawni Carnes, MD Consultants: none Code Status: FULL  Pt Overview and Major Events to Date:  3/1: Admitted for HHS 3/2: transitioned to Subcutaneous insulin   Assessment and Plan: Amran Malter is a 35 y.o. female presenting with elevated blood glucose and skin abscess. PMH is significant for HTN, DM2, HLD, hx of skin abscesses  DKA: secondary to not being able to afford insulin and her abscess. Her last hemoglobin A1c on 12/2015 was 16.2. Last admitted for DKA in 12/2015 for same reasons. Anion gap closed x 2 overnight. Transitioned to Subcutaneous insulin overnight @ 0400 3/2.  - Lantus increased to 50 units  - increase to resistant SSI  - carb modified diet  - CBG ACHS - IVF NS @ 150cc/hr (clinically still dehydrated)  - discussed ability to obtain medications, patient notes that her Medicaid is now active and she will not have any issues affording her medications in the future.  Hypokalemia, resolved Likely 2/2 management of DKA. S/p Kdur x 1 overnight   AKI: Cr 2.11>>1.08 > 1.13 (Basline 0.7-0.8) Most likely pre-renal in the setting of significant dehydration  - IVF as noted above - monitor with labs - holding home lisinopril  Abscess: s/p drainage in ED resulting in 20cc of purulent fluid. Wbc 21.7 > 18.1. Afebrile - due to continued tachycardia, leukocytosis, and induration of area will start Bactrim  - Percocet PRN for pain  HTN: Stable, 109-128/72-89. Home meds: Metoprolol  BID, Lisinopril  daily. Has not taken BP meds today.  - holding home Lisinopril due to AKI - continue Metoprolol  BID  - Hydralazine PRN   HLD:  - holding Lipitor 2/2 NPO  FEN/GI: NPO, IVF  Prophylaxis: Lovenox Sub Q   Disposition: pending management of DKA    Subjective:  Doing well. Feeling better today. No Nausea or abdominal pain.   Objective: Temp:  [98.1 F (36.7 C)-98.9 F (37.2 C)] 98.7 F (37.1 C) (03/02 0740) Pulse Rate:  [92-142] 104 (03/02 0906) Resp:  [0-34] 26 (03/02 0740) BP: (108-165)/(70-102) 116/77 mmHg (03/02 0906) SpO2:  [93 %-100 %] 99 % (03/02 0740) Weight:  [93.895 kg (207 lb)-96 kg (211 lb 10.3 oz)] 96 kg (211 lb 10.3 oz) (03/01 2130) Physical Exam: General: NAD, awake and alert  HEENT: lips mildly dry but overall improved, dry MM Cardiovascular: mild tachycardia but overall improved RR, no m/r/g Respiratory: normal effort, CTAB Abdomen: soft, NT, ND Extremities: no LE edema Skin: capillary refill < 2 seconds, Drained abscess on left flank area with surrounding induration, very mild erythema, expression of bloody purulent material with palpation.  Laboratory:   Recent Labs Lab 02/10/16 1312 02/10/16 2150 02/11/16 0036 02/11/16 0210  NA 135 144 141 140  K 5.2* 4.0 3.3* 3.3*  CL 107 114* 116* 114*  CO2 9* 14* 16* 15*  BUN CREATININE 2.11* 1.35* 1.07* 1.08*  CALCIUM 9.2 9.2 8.9 8.8*  PROT 8.4*  --   --   --   BILITOT 1.5*  --   --   --   ALKPHOS 103  --   --   --   ALT 5*  --   --   --   AST 11*  --   --   --   GLUCOSE 469* 191* 165* 152*  CBC    Component Value Date/Time   WBC 18.1* 02/11/2016 0925   RBC 3.97 02/11/2016 0925   HGB 11.3* 02/11/2016 0925   HCT 34.8* 02/11/2016 0925   PLT 338 02/11/2016 0925   MCV 87.7 02/11/2016 0925   MCH 28.5 02/11/2016 0925   MCHC 32.5 02/11/2016 0925   RDW 14.6 02/11/2016 0925   LYMPHSABS 2.2 02/10/2016 1312   MONOABS 1.1* 02/10/2016 1312   EOSABS 0.0 02/10/2016 1312   BASOSABS 0.0 02/10/2016 1312    Imaging/Diagnostic Tests: none  Palma Holter, MD 02/11/2016, 9:55 AM PGY-, [facility] FPTS Intern pager: [phone], text pages welcome

## 2016-02-12 ENCOUNTER — Inpatient Hospital Stay (HOSPITAL_COMMUNITY): Payer: Medicaid Other

## 2016-02-12 LAB — BASIC METABOLIC PANEL
ANION GAP: 11 (ref 5–15)
BUN: 8 mg/dL (ref 6–20)
CALCIUM: 8.4 mg/dL — AB (ref 8.9–10.3)
CO2: 14 mmol/L — AB (ref 22–32)
Chloride: 111 mmol/L (ref 101–111)
Creatinine, Ser: 1.05 mg/dL — ABNORMAL HIGH (ref 0.44–1.00)
Glucose, Bld: 299 mg/dL — ABNORMAL HIGH (ref 65–99)
Potassium: 3.5 mmol/L (ref 3.5–5.1)
Sodium: 136 mmol/L (ref 135–145)

## 2016-02-12 LAB — CBC
HEMATOCRIT: 32.6 % — AB (ref 36.0–46.0)
Hemoglobin: 10.7 g/dL — ABNORMAL LOW (ref 12.0–15.0)
MCH: 28.5 pg (ref 26.0–34.0)
MCHC: 32.8 g/dL (ref 30.0–36.0)
MCV: 86.9 fL (ref 78.0–100.0)
Platelets: 320 10*3/uL (ref 150–400)
RBC: 3.75 MIL/uL — ABNORMAL LOW (ref 3.87–5.11)
RDW: 14.7 % (ref 11.5–15.5)
WBC: 11.4 10*3/uL — AB (ref 4.0–10.5)

## 2016-02-12 LAB — GLUCOSE, CAPILLARY
GLUCOSE-CAPILLARY: 272 mg/dL — AB (ref 65–99)
GLUCOSE-CAPILLARY: 294 mg/dL — AB (ref 65–99)
GLUCOSE-CAPILLARY: 285 mg/dL — AB (ref 65–99)
Glucose-Capillary: 255 mg/dL — ABNORMAL HIGH (ref 65–99)
Glucose-Capillary: 263 mg/dL — ABNORMAL HIGH (ref 65–99)

## 2016-02-12 MED ORDER — OXYCODONE-ACETAMINOPHEN 5-325 MG PO TABS
1.0000 | ORAL_TABLET | ORAL | Status: DC | PRN
  Administered 2016-02-12: 1 via ORAL
  Administered 2016-02-12 (×2): 2 via ORAL
  Administered 2016-02-13: 1 via ORAL
  Filled 2016-02-12 (×3): qty 2
  Filled 2016-02-12: qty 1

## 2016-02-12 MED ORDER — INSULIN ASPART 100 UNIT/ML ~~LOC~~ SOLN: 0.0000 [IU] | Freq: Three times a day (TID) | SUBCUTANEOUS | Status: DC

## 2016-02-12 MED ORDER — INSULIN ASPART 100 UNIT/ML ~~LOC~~ SOLN
0.0000 [IU] | Freq: Three times a day (TID) | SUBCUTANEOUS | Status: DC
  Administered 2016-02-12: 11 [IU] via SUBCUTANEOUS
  Administered 2016-02-12: 7 [IU] via SUBCUTANEOUS
  Administered 2016-02-12: 11 [IU] via SUBCUTANEOUS
  Administered 2016-02-13: 7 [IU] via SUBCUTANEOUS
  Administered 2016-02-13: 11 [IU] via SUBCUTANEOUS

## 2016-02-12 MED ORDER — INSULIN GLARGINE 100 UNIT/ML ~~LOC~~ SOLN
50.0000 [IU] | SUBCUTANEOUS | Status: DC
  Administered 2016-02-13: 50 [IU] via SUBCUTANEOUS
  Filled 2016-02-12: qty 0.5

## 2016-02-12 NOTE — Progress Notes (Signed)
Patient supplied with list of pharmacies that deliver in [place].

## 2016-02-12 NOTE — Progress Notes (Signed)
Inpatient Diabetes Program Recommendations  AACE/ADA: New Consensus Statement on Inpatient Glycemic Control (2015)  Target Ranges:  Prepandial:   less than 140 mg/dL      Peak postprandial:   less than 180 mg/dL (1-2 hours)      Critically ill patients:  140 - 180 mg/dL  Results for Rebecca Rhodes, Rebecca Rhodes (MRN [id number]) as of 02/12/2016 13:58  Ref. Range 02/11/2016 07:38 02/11/2016 11:50 02/11/2016 16:32 02/11/2016 21:13 02/12/2016 03:33 02/12/2016 08:22 02/12/2016 11:34  Glucose-Capillary Latest Ref Range: 65-99 mg/dL 409195 (H) 811373 (H) 914314 (H) 341 (H) 255 (H) 263 (H) 272 (H)   Review of Glycemic Control  Diabetes history: DM2 Outpatient Diabetes medications: Lantus 60 units daily, Novolog 20 units TID with meals Current orders for Inpatient glycemic control: Lantus 50 units Q24H, Novolog 0-20 units TID with meals, Novolog 0-5 units QHS  Inpatient Diabetes Program Recommendations: Insulin - Basal: Noted Lantus was increased to 50 units and glucose is still consistently over 250 mg/dl. Please consider increasing Lantus to 58 units Q24H (based on 96 kg x 0.6 units). Insulin - Meal Coverage: Please order Novolog 6 units TID with meals for meal coverage if patient eats at least 50% of meals (in addition to Novolog correction scale).  Thanks, Orlando PennerMarie Gwenevere Goga, RN, MSN, CDE Diabetes Coordinator Inpatient Diabetes Program [phone] (Team Pager from 8am to 5pm) [phone] ([facility] office) [phone] [facility] office) [phone] [facility] office)

## 2016-02-12 NOTE — Progress Notes (Signed)
Family Medicine Teaching Service Daily Progress Note Intern Pager: [phone]  Patient name: Rebecca Rhodes Medical record number: [id number] Date of birth: 11/08/1981 Age: 35 y.o. Gender: female  Primary Care Provider: Tawni CarnesAndrew Wight, MD Consultants: none Code Status: FULL  Pt Overview and Major Events to Date:  3/1: Admitted for HHS 3/2: transitioned to Subcutaneous insulin; Bactrim for cellulitis   Assessment and Plan: Rebecca SiKeisha Hanger is a 35 y.o. female presenting with elevated blood glucose and skin abscess. PMH is significant for HTN, DM2, HLD, hx of skin abscesses  DKA, resolved: secondary to not being able to afford insulin and her abscess. Her last hemoglobin A1c on 12/2015 was 16.2. Last admitted for DKA in 12/2015 for same reasons. CBGs 255-373, AM 299 Novolog 42 units total over 24 hours  - Lantus increased to 50 units today - resistant SSI  - carb modified diet  - CBG ACHS - IVF NS @ 150cc/hr (clinically still dehydrated)  - discussed ability to obtain medications, patient notes that her Medicaid is now active and she will not have any issues affording her medications in the future.  Hypokalemia, resolved Likely 2/2 management of DKA. S/p Kdur 20mEq x 1 overnight   AKI: Cr 2.11>> 1.11 > 1.05 (Basline 0.7-0.8) Most likely pre-renal in the setting of significant dehydration  - IVF as noted above - monitor with labs - holding home lisinopril  Abscess/Cellulitis: s/p drainage in ED resulting in 20cc of purulent fluid. Wbc 21.7 > 18.1 > 11.4. Afebrile - due to continued tachycardia, leukocytosis, and induration of area will start Bactrim  - Percocet PRN for pain (increased dose for better pain control)  - wound consult; consider Ultrasound   HTN: Stable, 109-128/72-89. Home meds: Metoprolol 25mg  BID, Lisinopril 40mg  daily. Has not taken BP meds today.  - holding home Lisinopril due to AKI - continue Metoprolol 25mg  BID  - Hydralazine PRN   HLD:  - holding Lipitor 2/2  NPO  FEN/GI: NPO, IVF  Prophylaxis: Lovenox Sub Q   Disposition: pending management of DKA   Subjective:  Doing well. Feeling better today. No Nausea or abdominal pain. Notes of pain at site of cellulitis  Objective: Temp:  [98.2 F (36.8 C)-99.3 F (37.4 C)] 99.3 F (37.4 C) (03/03 0528) Pulse Rate:  [94-104] 103 (03/03 0528) Resp:  [15-30] 16 (03/03 0528) BP: (116-139)/(66-94) 139/94 mmHg (03/03 0528) SpO2:  [98 %-100 %] 100 % (03/03 0528) Weight:  [96 kg (211 lb 10.3 oz)] 96 kg (211 lb 10.3 oz) (03/02 1831) Physical Exam: General: NAD, awake and alert  HEENT: lips mildly dry but overall improved, dry MM Cardiovascular: mild tachycardia but overall improved RR, no m/r/g Respiratory: normal effort, CTAB Abdomen: soft, NT, ND Extremities: no LE edema Skin: capillary refill < 2 seconds, Drained abscess on left flank area with surrounding induration without much improvement, no erythema, bandage soaked with purulent discharge that is foul smelling.  Laboratory:   Recent Labs Lab 02/10/16 1312  02/11/16 0925 02/11/16 1627 02/12/16 0546  NA 135  < > 137 136 136  K 5.2*  < > 3.9 3.8 3.5  CL 107  < > 113* 109 111  CO2 9*  < > 14* 13* 14*  BUN 15  < > 12 10 8   CREATININE 2.11*  < > 1.13* 1.11* 1.05*  CALCIUM 9.2  < > 8.5* 8.8* 8.4*  PROT 8.4*  --   --   --   --   BILITOT 1.5*  --   --   --   --  ALKPHOS 103  --   --   --   --   ALT 5*  --   --   --   --   AST 11*  --   --   --   --   GLUCOSE 469*  < > 274* 324* 299*  < > = values in this interval not displayed. CBC    Component Value Date/Time   WBC 11.4* 02/12/2016 0546   RBC 3.75* 02/12/2016 0546   HGB 10.7* 02/12/2016 0546   HCT 32.6* 02/12/2016 0546   PLT 320 02/12/2016 0546   MCV 86.9 02/12/2016 0546   MCH 28.5 02/12/2016 0546   MCHC 32.8 02/12/2016 0546   RDW 14.7 02/12/2016 0546   LYMPHSABS 2.2 02/10/2016 1312   MONOABS 1.1* 02/10/2016 1312   EOSABS 0.0 02/10/2016 1312   BASOSABS 0.0 02/10/2016  1312    Imaging/Diagnostic Tests: none  Palma Holter, MD 02/12/2016, 8:19 AM PGY-, [facility] FPTS Intern pager: [phone], text pages welcome

## 2016-02-12 NOTE — Care Management Note (Signed)
Case Management Note  Patient Details  Name: Rebecca Rhodes MRN: [id number] Date of Birth: 02/02/1981  Subjective/Objective:                  Date- 02-12-16 Initial Assessment Patient transferred from [facility]. Spoke with patient at the bedside. Introduced self as Sports coachcase manager and explained role in discharge planning and how to be reached.  [place] Verified patient anticipates to go home with spouse at time of discharge.  Patient has no DME. Expressed potential need for no other DME.  Patient confirmed needing help with their medication. Patient states that she does not have any problems paying for medication now, her medicaid recently started. Her problem is being able to find a ride to the pharmacy. Spoke to the patient about a plan to be look at her vials to see how close she is to running out of meds and think a few days in advance to arrange to get a ride to the pharmacy, patient agreed that is her responsibility. She is also in the process of [facility] to set up her medications to be delivered to her house. She is goint to have her MIL pick up her Rx after discharge.  Patient uses the bus, and has her mother in law drive her as well  to MD appointments.  Verified patient has [place]. Patient states they currently receive HH services through no one.   Admission Comments:   Lawerance Sabalebbie Jenine Krisher RN BSN CM [phone]  Action/Plan:  [facility]  Expected Discharge Date:                  Expected Discharge Plan:  Home w Home Health Services  In-House Referral:     Discharge planning Services  CM Consult  Post Acute Care Choice:  Resumption of Svcs/PTA Provider, Home Health Choice offered to:  Patient  DME Arranged:    DME Agency:     HH Arranged:  RN HH Agency:  [facility]  Status of Service:     Medicare Important Message Given:    Date Medicare IM Given:    Medicare IM give by:     Date Additional Medicare IM Given:    Additional Medicare Important Message give by:     If discussed at Long Length of Stay Meetings, dates discussed:    Additional Comments:  Lawerance SabalDebbie Barclay Lennox, RN 02/12/2016, 1:09 PM

## 2016-02-13 DIAGNOSIS — E785 Hyperlipidemia, unspecified: Secondary | ICD-10-CM | POA: Insufficient documentation

## 2016-02-13 LAB — GLUCOSE, CAPILLARY
Glucose-Capillary: 217 mg/dL — ABNORMAL HIGH (ref 65–99)
Glucose-Capillary: 212 mg/dL — ABNORMAL HIGH (ref 65–99)
Glucose-Capillary: 253 mg/dL — ABNORMAL HIGH (ref 65–99)

## 2016-02-13 LAB — BASIC METABOLIC PANEL
ANION GAP: 9 (ref 5–15)
BUN: <5 mg/dL — ABNORMAL LOW (ref 6–20)
CALCIUM: 8 mg/dL — AB (ref 8.9–10.3)
CO2: 20 mmol/L — AB (ref 22–32)
CREATININE: 0.89 mg/dL (ref 0.44–1.00)
Chloride: 108 mmol/L (ref 101–111)
GLUCOSE: 334 mg/dL — AB (ref 65–99)
Potassium: 3.1 mmol/L — ABNORMAL LOW (ref 3.5–5.1)
SODIUM: 137 mmol/L (ref 135–145)

## 2016-02-13 MED ORDER — INSULIN GLARGINE 100 UNIT/ML ~~LOC~~ SOLN: 60.0000 [IU] | Freq: Every day | SUBCUTANEOUS | Status: DC

## 2016-02-13 MED ORDER — ACCU-CHEK SOFTCLIX LANCET DEV KIT: 60.0000 [IU] | PACK | Freq: Every day | Status: DC

## 2016-02-13 MED ORDER — SULFAMETHOXAZOLE-TRIMETHOPRIM 400-80 MG PO TABS: 1.0000 | ORAL_TABLET | Freq: Two times a day (BID) | ORAL | Status: DC

## 2016-02-13 MED ORDER — ACCU-CHEK AVIVA PLUS W/DEVICE KIT: 60.0000 [IU] | PACK | Freq: Every day | Status: DC

## 2016-02-13 MED ORDER — INSULIN ASPART 100 UNIT/ML ~~LOC~~ SOLN
5.0000 [IU] | Freq: Three times a day (TID) | SUBCUTANEOUS | Status: DC
  Administered 2016-02-13: 5 [IU] via SUBCUTANEOUS

## 2016-02-13 MED ORDER — GLUCOSE BLOOD VI STRP: ORAL_STRIP | Status: DC

## 2016-02-13 MED ORDER — INSULIN GLARGINE 100 UNIT/ML ~~LOC~~ SOLN
55.0000 [IU] | SUBCUTANEOUS | Status: DC
  Filled 2016-02-13: qty 0.55

## 2016-02-13 MED ORDER — INSULIN ASPART 100 UNIT/ML ~~LOC~~ SOLN: 20.0000 [IU] | Freq: Three times a day (TID) | SUBCUTANEOUS | Status: DC

## 2016-02-13 MED ORDER — ACCU-CHEK SOFTCLIX LANCETS MISC: Status: DC

## 2016-02-13 NOTE — Progress Notes (Signed)
Family Medicine Teaching Service Daily Progress Note Intern Pager: [phone]  Patient name: Rebecca Rhodes Medical record number: [id number] Date of birth: May 07, 1981 Age: 35 y.o. Gender: female  Primary Care Provider: Tawni Carnes, MD Consultants: none Code Status: FULL  Pt Overview and Major Events to Date:  3/1: Admitted for HHS 3/2: transitioned to Subcutaneous insulin; Bactrim for cellulitis   ABX/Culture Bactrim 3/2>>   Assessment and Plan: Rebecca Rhodes is a 35 y.o. female presenting with elevated blood glucose and skin abscess. PMH is significant for HTN, DM2, HLD, hx of skin abscesses  #Abscess/Cellulitis: s/p drainage in ED. Able to express more purulent material yesterday.   - Percocet PRN for pain (increased dose for better pain control)  - wound consult - Ultrasound: small fluid collection (2.2 x 1.6 x 2.0 cm complex collection)    #DKA, resolved: secondary to not being able to afford insulin and her abscess. Her last hemoglobin A1c on 12/2015 was 16.2. Last admitted for DKA in 12/2015 for same reasons.  - Lantus increased to 55 units today - added schedule novolog 5 U with meals if >50% is eaten - resistant SSI  - carb modified diet  - CBG ACHS  #AKI: Improving. (Basline 0.7-0.8) Most likely pre-renal in the setting of significant dehydration  - monitor with labs - holding home lisinopril, re-start on discharge  #HTN: Stable,  Home meds: Metoprolol  BID, Lisinopril  daily.  - holding home Lisinopril due to AKI - continue Metoprolol  BID  - Hydralazine PRN   #HLD: needs lipid panel at outpatient follow up.  - holding Lipitor 2/2 NPO  FEN/GI: Carb modified diet, IVF @ 75 mL/hr Prophylaxis: Lovenox Sub Q   Disposition: pending improvement   Subjective:  Lying in bed. No events overnight. No complaints. Not complaining of pain from abscess as long as she isn't lying on it.   Objective: Temp:  [98.4 F (36.9 C)-99.7 F (37.6 C)] 99.1 F (37.3 C)  (03/04 0542) Pulse Rate:  [91-122] 101 (03/04 0542) Resp:  [18-19] 18 (03/04 0542) BP: (149-163)/(91-113) 156/96 mmHg (03/04 0542) SpO2:  [95 %-100 %] 100 % (03/04 0542) Physical Exam: General: NAD, awake and alert  Cardiovascular: mild tachycardia but overall improved RR, no m/r/g Respiratory: normal effort, CTAB Abdomen: soft, NT, ND Extremities: no LE edema Skin:  no erythema, bandage soaked with purulent discharge that is foul smelling.  Laboratory:   Recent Labs Lab 02/10/16 1312  02/11/16 0925 02/11/16 1627 02/12/16 0546  NA 135  < > 137 136 136  K 5.2*  < > 3.9 3.8 3.5  CL 107  < > 113* 109 111  CO2 9*  < > 14* 13* 14*  BUN 15  < > CREATININE 2.11*  < > 1.13* 1.11* 1.05*  CALCIUM 9.2  < > 8.5* 8.8* 8.4*  PROT 8.4*  --   --   --   --   BILITOT 1.5*  --   --   --   --   ALKPHOS 103  --   --   --   --   ALT 5*  --   --   --   --   AST 11*  --   --   --   --   GLUCOSE 469*  < > 274* 324* 299*  < > = values in this interval not displayed. CBC    Component Value Date/Time   WBC 11.4* 02/12/2016 0546   RBC 3.75* 02/12/2016  0546   HGB 10.7* 02/12/2016 0546   HCT 32.6* 02/12/2016 0546   PLT 320 02/12/2016 0546   MCV 86.9 02/12/2016 0546   MCH 28.5 02/12/2016 0546   MCHC 32.8 02/12/2016 0546   RDW 14.7 02/12/2016 0546   LYMPHSABS 2.2 02/10/2016 1312   MONOABS 1.1* 02/10/2016 1312   EOSABS 0.0 02/10/2016 1312   BASOSABS 0.0 02/10/2016 1312    Imaging/Diagnostic Tests: Ultrasound: Small complex fluid collection in the left posterior thoracic wall.  Myra RudeJeremy E Schmitz, MD 02/13/2016, 9:14 AM PGY-3, [facility] FPTS Intern pager: [phone], text pages welcome

## 2016-02-13 NOTE — Progress Notes (Signed)
Nsg Discharge Note  Admit Date:  02/10/2016 Discharge date: 02/13/2016   Rebecca Rhodes to be D/C'd Home with home health per MD order.  AVS completed.  Copy for chart, and copy for patient signed, and dated. Patient/caregiver able to verbalize understanding.  Discharge Medication:   Medication List    STOP taking these medications        TRUE METRIX METER Devi  Replaced by:  ACCU-CHEK AVIVA PLUS w/Device Kit      TAKE these medications        ACCU-CHEK AVIVA PLUS w/Device Kit  60 Units by Does not apply route daily before breakfast.     ACCU-CHEK SOFTCLIX LANCET DEV Kit  60 Units by Does not apply route daily before breakfast.     ACCU-CHEK SOFTCLIX LANCETS lancets  Use as instructed     acetaminophen 500 MG tablet  Commonly known as:  TYLENOL  Take 1,000 mg by mouth every 6 (six) hours as needed for mild pain.     aspirin 81 MG chewable tablet  Chew 1 tablet (81 mg total) by mouth daily.     atorvastatin 40 MG tablet  Commonly known as:  LIPITOR  Take 1 tablet (40 mg total) by mouth daily at 6 PM.     glucose blood test strip  Commonly known as:  ACCU-CHEK AVIVA PLUS  Use as instructed     HYDROcodone-acetaminophen 7.5-325 MG tablet  Commonly known as:  NORCO  Take 1-2 tablets by mouth every 4 (four) hours as needed for moderate pain or severe pain.     insulin aspart 100 UNIT/ML injection  Commonly known as:  novoLOG  Inject 20 Units into the skin 3 (three) times daily with meals.     insulin glargine 100 UNIT/ML injection  Commonly known as:  LANTUS  Inject 0.6 mLs (60 Units total) into the skin daily.     lisinopril 40 MG tablet  Commonly known as:  PRINIVIL,ZESTRIL  Take 1 tablet (40 mg total) by mouth daily.     metoprolol tartrate 25 MG tablet  Commonly known as:  LOPRESSOR  Take 1 tablet (25 mg total) by mouth 2 (two) times daily.     polyethylene glycol packet  Commonly known as:  MIRALAX / GLYCOLAX  Take 17 g by mouth daily.     PRESCRIPTION  MEDICATION  Take 2 tablets by mouth every 6 (six) hours as needed (pain). Pain medication that starts with "M"     senna-docusate 8.6-50 MG tablet  Commonly known as:  Senokot-S  Take 2 tablets by mouth 2 (two) times daily.     sulfamethoxazole-trimethoprim 400-80 MG tablet  Commonly known as:  BACTRIM,SEPTRA  Take 1 tablet by mouth every 12 (twelve) hours. For a total of 5 more days.        Discharge Assessment: Filed Vitals:   02/13/16 0542 02/13/16 1320  BP: 156/96 154/102  Pulse: 101 92  Temp: 99.1 F (37.3 C) 98.5 F (36.9 C)  Resp: 18 19   Skin clean, dry and intact without evidence of skin break down, no evidence of skin tears noted. IV catheter discontinued intact. Site without signs and symptoms of complications - no redness or edema noted at insertion site, patient denies c/o pain - only slight tenderness at site.  Dressing with slight pressure applied.  D/c Instructions-Education: Discharge instructions given to patient/family with verbalized understanding. D/c education completed with patient/family including follow up instructions, medication list, d/c activities limitations if indicated, with other  d/c instructions as indicated by MD - patient able to verbalize understanding, all questions fully answered. Dressing changed to Left side of back before discharge. Moderate amount of drainage noted.  Patient instructed to return to ED, call 911, or call MD for any changes in condition.  Patient escorted via [place], and D/C home via private auto.  Dayle Points, RN 02/13/2016 3:43 PM

## 2016-02-13 NOTE — Discharge Instructions (Signed)
Please take all of the antibiotics.   Please call us back if you are not able to obtain your insulin. Call our clinic [phone]) and our answering will respond to you.   Please measure your blood sugars at least three times daily and bring those with you to your appointment on Tuesday.    Abscess An abscess is an infected area that contains a collection of pus and debris.It can occur in almost any part of the body. An abscess is also known as a furuncle or boil. CAUSES  An abscess occurs when tissue gets infected. This can occur from blockage of oil or sweat glands, infection of hair follicles, or a minor injury to the skin. As the body tries to fight the infection, pus collects in the area and creates pressure under the skin. This pressure causes pain. People with weakened immune systems have difficulty fighting infections and get certain abscesses more often.  SYMPTOMS Usually an abscess develops on the skin and becomes a painful mass that is red, warm, and tender. If the abscess forms under the skin, you may feel a moveable soft area under the skin. Some abscesses break open (rupture) on their own, but most will continue to get worse without care. The infection can spread deeper into the body and eventually into the bloodstream, causing you to feel ill.  DIAGNOSIS  Your caregiver will take your medical history and perform a physical exam. A sample of fluid may also be taken from the abscess to determine what is causing your infection. TREATMENT  Your caregiver may prescribe antibiotic medicines to fight the infection. However, taking antibiotics alone usually does not cure an abscess. Your caregiver may need to make a small cut (incision) in the abscess to drain the pus. In some cases, gauze is packed into the abscess to reduce pain and to continue draining the area. HOME CARE INSTRUCTIONS   Only take over-the-counter or prescription medicines for pain, discomfort, or fever as directed by your  caregiver.  If you were prescribed antibiotics, take them as directed. Finish them even if you start to feel better.  If gauze is used, follow your caregiver's directions for changing the gauze.  To avoid spreading the infection:  Keep your draining abscess covered with a bandage.  Wash your hands well.  Do not share personal care items, towels, or whirlpools with others.  Avoid skin contact with others.  Keep your skin and clothes clean around the abscess.  Keep all follow-up appointments as directed by your caregiver. SEEK MEDICAL CARE IF:   You have increased pain, swelling, redness, fluid drainage, or bleeding.  You have muscle aches, chills, or a general ill feeling.  You have a fever. MAKE SURE YOU:   Understand these instructions.  Will watch your condition.  Will get help right away if you are not doing well or get worse.   This information is not intended to replace advice given to you by your health care provider. Make sure you discuss any questions you have with your health care provider.   Document Released: 09/07/2005 Document Revised: 05/29/2012 Document Reviewed: 02/10/2012 Elsevier Interactive Patient Education [employer]2016 Elsevier Inc.

## 2016-02-13 NOTE — Progress Notes (Signed)
CM notified [facility] rep, Tiffany of pt discharge and resumption of HHRN care.  No other CM needs were communicated.

## 2016-02-13 NOTE — Progress Notes (Signed)
Family Practice Teaching Service Interval Progress Note  Patient sitting in bed. She agreed to have her abscess drained further.   Incision and Drainage Procedure Note: left flank abscess   The affected area was cleaned and draped in a sterile fashion. Anesthesia was achieved using 10 mL of 1% Lidocaine w/out epinephrine injected around the wound area using a 25-guage 1 inch needle. An 11-blade scalpel was used to incise the wound.  A hemostat was used to break any loculations that were present.  A sterile dressing was applied to the area. The patient tolerated the procedure well. No complications were encountered.    Clare GandyJeremy Schmitz, MD Family Medicine PGY-3 Service Pager [phone]

## 2016-02-14 NOTE — Discharge Summary (Signed)
[facility] Discharge Summary  Patient name: Rebecca Rhodes Medical record number: [id number] Date of birth: [birth year] Age: 35 y.o. Gender: female Date of Admission: 02/10/2016  Date of Discharge: 02/13/16 Admitting Physician: Alveda Reasons, MD  Primary Care Provider: Tawanna Sat, MD Consultants: none  Indication for Hospitalization: DKA and abscess on back   Discharge Diagnoses/Problem List:  DKA/HHS Abscess/Cellulitis on Back AKI  Disposition: home  Discharge Condition: improved and stable   Discharge Exam:  Please refer to progress note from day of discharge  Brief Hospital Course:   DKA/HHS 2/2 inability to afford insulin due to lack of insurance: Patient was seen in clinic on day of discharge for an abscess on her back; she was found to be tachycardic with kussmaul breathing and fruity breath. Her CBG in clinic was 487. Patient was sent to the hospital for further evaluation. Significant labs from admission include the following: CBG: 469. VBG pH 7.12, 43. Bicarb 7.7. K:5.4 AG 19. UA > 80 ketones and > 1000 glucose. Patient was given IVF boluses and started on insulin drip. She was eventually transitioned to Subcutaneous insulin. She was discharged on her home insulin regimen. She reports she now has Medicaid and is able to afford her medications.   Abscess/Cellulitis:  Patient was noted to have an abscess in her left lower lateral back. Patient was afebrile on presentation and had leukocytosis of 21.7 on admission. Abscess was I&D'ed in the, ED but continued to drain purulent foul smelling fluid. A soft tissue US was obtained which showed a small complex fluid collection in the left posterior thoracic wall. Patient had a second I&D at bedside. Patient was started on Bactrim and sent home with prescription to complete a total 7 days of antibiotics.   AKI: Patient had AKI with Creatine to 1.35 on admission. Creatinine improved and returned to baseline  with IVF. Home Lisinopril was held during hospitalization and resumed at discharge.    Issues for Follow Up:  - f/u cellulitis/ abscess s/p I&D - ensure patient is able to afford Insulin   Significant Procedures: I&D of abscess on back  Significant Labs and Imaging:   Recent Labs Lab 02/10/16 1312 02/11/16 0925 02/12/16 0546  WBC 21.7* 18.1* 11.4*  HGB 12.5 11.3* 10.7*  HCT 39.2 34.8* 32.6*  PLT 361 338 320    Recent Labs Lab 02/10/16 1312  02/11/16 0220 02/11/16 0925 02/11/16 1627 02/12/16 0546 02/13/16 1029  NA 135  < > 135 137 136 136 137  K 5.2*  < > 3.8 3.9 3.8 3.5 3.1*  CL 107  < > 109 113* 109 111 108  CO2 9*  < > 14* 14* 13* 14* 20*  GLUCOSE 469*  < > 367* 274* 324* 299* 334*  BUN 15  < > '11 12 10 8 '$ <5*  CREATININE 2.11*  < > 1.23* 1.13* 1.11* 1.05* 0.89  CALCIUM 9.2  < > 8.8* 8.5* 8.8* 8.4* 8.0*  ALKPHOS 103  --   --   --   --   --   --   AST 11*  --   --   --   --   --   --   ALT 5*  --   --   --   --   --   --   ALBUMIN 3.0*  --   --   --   --   --   --   < > = values in this interval not  displayed.  US Soft Tissue:  FINDINGS: Targeted sonographic images of the posterior thoracic wall in the region of the clinical concern was performed using grayscale and color Doppler. A 2.2 x 1.6 x 2.0 cm complex collection is noted in the left posterior thoracic wall in the region of the drainage. Small echogenic foci with dirty posterior shadowing within this collection likely represent small pockets of gas. There there is edema in the surrounding soft tissue.  IMPRESSION: Small complex fluid collection in the left posterior thoracic wall.  Results/Tests Pending at Time of Discharge: none  Discharge Medications:    Medication List    STOP taking these medications        TRUE METRIX METER Devi  Replaced by:  ACCU-CHEK AVIVA PLUS w/Device Kit      TAKE these medications        ACCU-CHEK AVIVA PLUS w/Device Kit  60 Units by Does not apply route daily  before breakfast.     ACCU-CHEK SOFTCLIX LANCET DEV Kit  60 Units by Does not apply route daily before breakfast.     ACCU-CHEK SOFTCLIX LANCETS lancets  Use as instructed     acetaminophen 500 MG tablet  Commonly known as:  TYLENOL  Take 1,000 mg by mouth every 6 (six) hours as needed for mild pain.     aspirin 81 MG chewable tablet  Chew 1 tablet (81 mg total) by mouth daily.     atorvastatin 40 MG tablet  Commonly known as:  LIPITOR  Take 1 tablet (40 mg total) by mouth daily at 6 PM.     glucose blood test strip  Commonly known as:  ACCU-CHEK AVIVA PLUS  Use as instructed     HYDROcodone-acetaminophen 7.5-325 MG tablet  Commonly known as:  NORCO  Take 1-2 tablets by mouth every 4 (four) hours as needed for moderate pain or severe pain.     insulin aspart 100 UNIT/ML injection  Commonly known as:  novoLOG  Inject 20 Units into the skin 3 (three) times daily with meals.     insulin glargine 100 UNIT/ML injection  Commonly known as:  LANTUS  Inject 0.6 mLs (60 Units total) into the skin daily.     lisinopril 40 MG tablet  Commonly known as:  PRINIVIL,ZESTRIL  Take 1 tablet (40 mg total) by mouth daily.     metoprolol tartrate 25 MG tablet  Commonly known as:  LOPRESSOR  Take 1 tablet (25 mg total) by mouth 2 (two) times daily.     polyethylene glycol packet  Commonly known as:  MIRALAX / GLYCOLAX  Take 17 g by mouth daily.     PRESCRIPTION MEDICATION  Take 2 tablets by mouth every 6 (six) hours as needed (pain). Pain medication that starts with "M"     senna-docusate 8.6-50 MG tablet  Commonly known as:  Senokot-S  Take 2 tablets by mouth 2 (two) times daily.     sulfamethoxazole-trimethoprim 400-80 MG tablet  Commonly known as:  BACTRIM,SEPTRA  Take 1 tablet by mouth every 12 (twelve) hours. For a total of 5 more days.        Discharge Instructions: Please refer to Patient Instructions section of EMR for full details.  Patient was counseled important  signs and symptoms that should prompt return to medical care, changes in medications, dietary instructions, activity restrictions, and follow up appointments.   Follow-Up Appointments:     Follow-up Information    Follow up with [facility].  Why:  Resume HH RN   Contact information:   [address] [place] [address] [phone]       Follow up with Marina Goodell, MD. Go in 3 days.   Specialty:  Family Medicine   Why:  hospital follow up. 02/16/2016 @ 1:45   Contact information:   [place] [address] [phone]       Smiley Houseman, MD 02/14/2016, 10:07 AM PGY-1, [place]

## 2016-02-16 ENCOUNTER — Inpatient Hospital Stay: Payer: Medicaid Other | Admitting: Student

## 2016-02-22 ENCOUNTER — Ambulatory Visit (INDEPENDENT_AMBULATORY_CARE_PROVIDER_SITE_OTHER): Payer: Medicaid Other | Admitting: Student

## 2016-02-22 ENCOUNTER — Encounter: Payer: Self-pay | Admitting: Student

## 2016-02-22 VITALS — BP 142/90 | HR 86 | Temp 98.0°F | Wt 222.0 lb

## 2016-02-22 DIAGNOSIS — L0291 Cutaneous abscess, unspecified: Secondary | ICD-10-CM | POA: Diagnosis present

## 2016-02-22 NOTE — Patient Instructions (Signed)
Follow up with PCP in 2 weeks for Diabetes and post I& check If you have fevers , nausea, vomiting, diarrhea call the office at [phone] seen earlier Call the office if any questions or concerns

## 2016-02-22 NOTE — Progress Notes (Signed)
   Subjective:    Patient ID: Rebecca Rhodes, female    DOB: 03/31/1981, 35 y.o.   MRN: [id number]   CC: Hospital follow up for DKA  HPI: 35 year old female presented for hospital follow-up for DKA.  DKA - Patient reports she's been taking her insulin as prescribed - Blood sugars have been ranging in the 300s  Abscess - Patient is changing her abscess packing daily - Home health to come twice a week in her to come tomorrow.  - denies pain, fever   Review of Systems ROS Per HPI else she denies chest pain, SOB, headache, weakness, changes in vision Past Medical, Surgical, Social, and Family History Reviewed & Updated per EMR.   Objective:  BP 142/90 mmHg  Pulse 86  Temp(Src) 98 F (36.7 C) (Oral)  Wt 222 lb (100.699 kg)  LMP 02/07/2016 (Approximate) Vitals and nursing note reviewed  General: NAD Cardiac: RRR, Respiratory: CTAB, normal effort Skin: packed abscess on left flank, no erythema or swelling or surrounding tenderness, piece of cerlix tape protruding Neuro: alert and oriented, no focal deficits   Assessment & Plan:    Abscess Abscess on flank healing well - continue daily dressing changes and packing - home health to come twice per week - will continue to follow  DKA (diabetic ketoacidoses) (HCC) Resolved     Alyssa A. Kennon RoundsHaney MD, MS Family Medicine Resident PGY-2 Pager [phone]

## 2016-02-23 NOTE — Assessment & Plan Note (Signed)
Resolved

## 2016-02-23 NOTE — Assessment & Plan Note (Signed)
Abscess on flank healing well - continue daily dressing changes and packing - home health to come twice per week - will continue to follow

## 2016-03-01 ENCOUNTER — Other Ambulatory Visit: Payer: Self-pay | Admitting: *Deleted

## 2016-03-01 NOTE — Telephone Encounter (Signed)
Patient is no enrolled with [facility] mail order.  She will be receiving refills from them on a monthly bases.  Patient's Rx for Norco should be mailed, they are requesting hard copy.  Clovis PuMartin, Tamika L, RN

## 2016-03-02 ENCOUNTER — Telehealth: Payer: Self-pay | Admitting: *Deleted

## 2016-03-02 MED ORDER — ASPIRIN 81 MG PO CHEW: 81.0000 mg | CHEWABLE_TABLET | Freq: Every day | ORAL | Status: DC

## 2016-03-02 MED ORDER — ACCU-CHEK AVIVA PLUS W/DEVICE KIT: PACK | Status: DC

## 2016-03-02 MED ORDER — ACCU-CHEK SOFTCLIX LANCETS MISC: Status: DC

## 2016-03-02 MED ORDER — INSULIN GLARGINE 100 UNIT/ML ~~LOC~~ SOLN: 60.0000 [IU] | Freq: Every day | SUBCUTANEOUS | Status: DC

## 2016-03-02 MED ORDER — ATORVASTATIN CALCIUM 40 MG PO TABS: 40.0000 mg | ORAL_TABLET | Freq: Every day | ORAL | Status: DC

## 2016-03-02 MED ORDER — INSULIN ASPART 100 UNIT/ML ~~LOC~~ SOLN: 20.0000 [IU] | Freq: Three times a day (TID) | SUBCUTANEOUS | Status: DC

## 2016-03-02 MED ORDER — LISINOPRIL 40 MG PO TABS: 40.0000 mg | ORAL_TABLET | Freq: Every day | ORAL | Status: DC

## 2016-03-02 MED ORDER — ACCU-CHEK SOFTCLIX LANCET DEV KIT: PACK | Status: DC

## 2016-03-02 MED ORDER — GLUCOSE BLOOD VI STRP: ORAL_STRIP | Status: DC

## 2016-03-02 MED ORDER — METOPROLOL TARTRATE 25 MG PO TABS: 25.0000 mg | ORAL_TABLET | Freq: Two times a day (BID) | ORAL | Status: DC

## 2016-03-02 NOTE — Telephone Encounter (Signed)
[name] from [facility] called needing clarification in a few of patient's prescriptions that was received today.  Please call with questions [phone].  Clovis PuMartin, Tamika L, RN   1. Test strips and lancets need specific directions for testing for billing insurance. 2. Lantus and Novolog quantity is only a 16 day supply #1 vial.  Please send quantity for 30 days; #2 vials. 3. Please send in syringes for the insulin vials.    Clovis PuMartin, Tamika L, RN

## 2016-03-04 NOTE — Telephone Encounter (Signed)
[facility] is calling again about the below message. Braylee Bosher, Maryjo RochesterJessica Dawn, CMA

## 2016-03-05 MED ORDER — INSULIN ASPART 100 UNIT/ML ~~LOC~~ SOLN: 20.0000 [IU] | Freq: Three times a day (TID) | SUBCUTANEOUS | Status: DC

## 2016-03-05 MED ORDER — INSULIN GLARGINE 100 UNIT/ML ~~LOC~~ SOLN: 60.0000 [IU] | Freq: Every day | SUBCUTANEOUS | Status: DC

## 2016-03-05 MED ORDER — ACCU-CHEK SOFTCLIX LANCET DEV KIT: PACK | Status: AC

## 2016-03-05 MED ORDER — "INSULIN SYRINGE 31G X 5/16"" 1 ML MISC": Status: DC

## 2016-03-05 MED ORDER — GLUCOSE BLOOD VI STRP: ORAL_STRIP | Status: DC

## 2016-03-08 ENCOUNTER — Ambulatory Visit: Payer: Medicaid Other | Admitting: Family Medicine

## 2016-03-14 ENCOUNTER — Ambulatory Visit (INDEPENDENT_AMBULATORY_CARE_PROVIDER_SITE_OTHER): Payer: Medicaid Other | Admitting: Family Medicine

## 2016-03-14 ENCOUNTER — Encounter: Payer: Self-pay | Admitting: Family Medicine

## 2016-03-14 VITALS — BP 142/89 | HR 81 | Temp 98.1°F | Wt 218.6 lb

## 2016-03-14 DIAGNOSIS — Z794 Long term (current) use of insulin: Secondary | ICD-10-CM

## 2016-03-14 DIAGNOSIS — E118 Type 2 diabetes mellitus with unspecified complications: Secondary | ICD-10-CM

## 2016-03-14 DIAGNOSIS — IMO0002 Reserved for concepts with insufficient information to code with codable children: Secondary | ICD-10-CM

## 2016-03-14 DIAGNOSIS — L0291 Cutaneous abscess, unspecified: Secondary | ICD-10-CM | POA: Diagnosis not present

## 2016-03-14 DIAGNOSIS — E1165 Type 2 diabetes mellitus with hyperglycemia: Secondary | ICD-10-CM

## 2016-03-14 LAB — POCT GLYCOSYLATED HEMOGLOBIN (HGB A1C): Hemoglobin A1C: 13.2

## 2016-03-14 NOTE — Assessment & Plan Note (Signed)
A1c is only marginally improved from ~16 to 13 today. She again does not bring in her meter or log to make insulin adjustments. She admitted today to a lot of sugar intake, particularly sugary drinks; I tried to counsel her that if she were to cut all sugary drinks out it would help her more than most any insulin adjustment could. DM foot exam normal today. Referred to ophthalmology as she has not had an exam done in a long time. Asked her to bring meter in to follow up visit in 3-4 days.

## 2016-03-14 NOTE — Patient Instructions (Signed)
Diabetes: Cut out ALL of the drinks containing sugar! Try and find non-sugar sweetened drinks ("artificial sugar") to replace these with initially, then your goal will be to try and switch to water Blood sugar meter: bring with you to each office visit   Back abscess:  WARM compresses at least 2 times a day, 15 minutes.  Come back end of the week or early next week to get this looked at to see if it needs to be opened up again.

## 2016-03-14 NOTE — Assessment & Plan Note (Signed)
Still draining a fair amount of fluid and some induration. There does not appear to be any spot that would increase drainage, though I imagine there are still loculations in the area that need to be broken up. She has not been doing warm compresses, which I asked her to do twice daily and then follow up Friday or Monday to assess need for further I&D.

## 2016-03-14 NOTE — Progress Notes (Signed)
   Subjective:    Patient ID: Rebecca Rhodes, female    DOB: 09/23/1981, 35 y.o.   MRN: [id number]  HPI  CC: follow up for DM  # DM:  "not good as it could be"  Not eating correctly - eating a lot of starches, sodas and juice -- about 4 of the 20oz bottles of sugar containing drinks. She says she realizes this is not good for her but doesn't like the taste of water.   CBG: left meter at home. Recall ranging 352-"too high". Had one low sugar because she gave herself some extra insulin when it was "too high". Didn't check sugar today  Has accidentally injected too much of the novolog because she mistook the label and thought it was lantus (so she injected 60 units); did not experience hypoglycemia from this ROS: no dysuria, +polyuria.  # Back abscess  Had I&D at beginning of March, took a week of bactrim  Still draining   Not painful  Has been covering with bandaid only, not doing anything else with it ROS: no fevers or chills  Social Hx: never smoker  Review of Systems   See HPI for ROS.   Past medical history, surgical, family, and social history reviewed and updated in the EMR as appropriate. Objective:  BP 142/89 mmHg  Pulse 81  Temp(Src) 98.1 F (36.7 C) (Oral)  Wt 218 lb 9.6 oz (99.156 kg)  LMP 02/02/2016 (Approximate) Vitals and nursing note reviewed  General: no apparent distress  Skin: left flank under bra-strap line there is an approx 8mm linear incision with minimal drainage. There is surrounding induration extending approx 5cm laterally on both sides, 2cm superiorly and inferiorly (total area approx 10cm x 4cm), no apparent fluctuance however able to express a fair amount of serous and slight pus fluid. No erythema. Nontender.  Extremities: DM foot exam done, normal sensation, no ulcers  Assessment & Plan:  Diabetes mellitus type 2, uncontrolled, with complications (HCC) A1c is only marginally improved from ~16 to 13 today. She again does not bring in her  meter or log to make insulin adjustments. She admitted today to a lot of sugar intake, particularly sugary drinks; I tried to counsel her that if she were to cut all sugary drinks out it would help her more than most any insulin adjustment could. DM foot exam normal today. Referred to ophthalmology as she has not had an exam done in a long time. Asked her to bring meter in to follow up visit in 3-4 days.  Abscess Still draining a fair amount of fluid and some induration. There does not appear to be any spot that would increase drainage, though I imagine there are still loculations in the area that need to be broken up. She has not been doing warm compresses, which I asked her to do twice daily and then follow up Friday or Monday to assess need for further I&D.

## 2016-03-21 ENCOUNTER — Ambulatory Visit (INDEPENDENT_AMBULATORY_CARE_PROVIDER_SITE_OTHER): Payer: Medicaid Other | Admitting: Student

## 2016-03-21 ENCOUNTER — Encounter: Payer: Self-pay | Admitting: Student

## 2016-03-21 VITALS — BP 143/98 | HR 79 | Temp 98.5°F | Ht 64.0 in | Wt 218.9 lb

## 2016-03-21 DIAGNOSIS — L0291 Cutaneous abscess, unspecified: Secondary | ICD-10-CM

## 2016-03-21 NOTE — Assessment & Plan Note (Signed)
Improving with hygiene and regular dressing changes. -Will continue to monitor - Return precautions given

## 2016-03-21 NOTE — Patient Instructions (Signed)
Follow up as scheduled If you develop fevers or chills call the office at [phone]

## 2016-03-21 NOTE — Assessment & Plan Note (Signed)
Last A1c 13. However unable to alter insulin regimen given she has not brought her glucose log or glucometer. - Patient counseled strongly to return in one week with PCP with glucometer and glucose log

## 2016-03-21 NOTE — Progress Notes (Signed)
   Subjective:    Patient ID: Rebecca Rhodes, female    DOB: 11/27/1981, 35 y.o.   MRN: [id number]   CC: abscess and diabetes follow up  HPI: [id number] are old female with history of poorly controlled type 2 diabetes presents for abscess and diabetes follow-up.  Abscess - Patient reports it is greatly improved, denies pain, redness, drainage from the site - Denies fevers - Has been using only Band-Aids to cover site at this time   Diabetes - Blood sugars have been in the high 200s to unreadable on her glucose meter - Reports taking juice, eating sweets, eating breads because these are what she has at home - Did not bring her glucose log or glucose meter with her because she forgot  Review of Systems ROS Per the history of present illness otherwise patient denies recent illnesses, fevers, signs of breath, chest pain, nausea, vomiting, diarrhea Past Medical, Surgical, Social, and Family History Reviewed & Updated per EMR.   Objective:  BP 143/98 mmHg  Pulse 79  Temp(Src) 98.5 F (36.9 C) (Oral)  Ht 5\' 4"  (1.626 m)  Wt 218 lb 14.4 oz (99.292 kg)  BMI 37.56 kg/m2  LMP 03/18/2016 Vitals and nursing note reviewed  General: NAD Cardiac: RRR,  Respiratory: CTAB, normal effort Skin: Abscess site on left flank mildly indurated with no fluctuance, approximately 1 cm opening. Small amount of serosanguineous fluid able to be expressed from the abscess site, no surrounding erythema    Assessment & Plan:    Diabetes mellitus type 2, uncontrolled, with complications (HCC) Last A1c 13. However unable to alter insulin regimen given she has not brought her glucose log or glucometer. - Patient counseled strongly to return in one week with PCP with glucometer and glucose log  Abscess Improving with hygiene and regular dressing changes. -Will continue to monitor - Return precautions given     Asta Corbridge A. Kennon RoundsHaney MD, MS Family Medicine Resident PGY-2 Pager [phone]

## 2016-03-28 ENCOUNTER — Ambulatory Visit: Payer: Medicaid Other | Admitting: Family Medicine

## 2016-06-06 IMAGING — US US CHEST/MEDIASTINUM
1 series · 14 of 16 positions shown · non-contrast
Comparison: None.

CLINICAL DATA: 35-year-old female with abscess of the left
posterior thoracic wall. Incision and drainage on 02/10/2016. There
is increasing drainage from the incision site

EXAM:
CHEST ULTRASOUND

[Series 1: us chest/mediastinum · 0.09mm/px · 29 acquisitions, 14 frames shown]
[im 1/29]
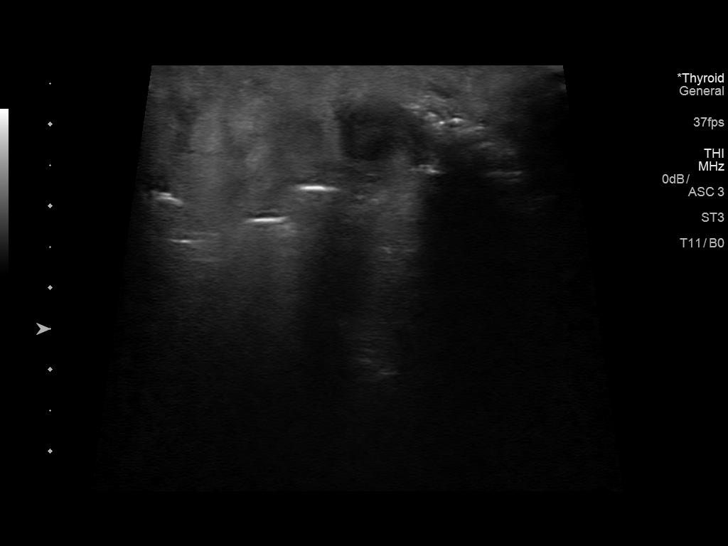
[im 2/29]
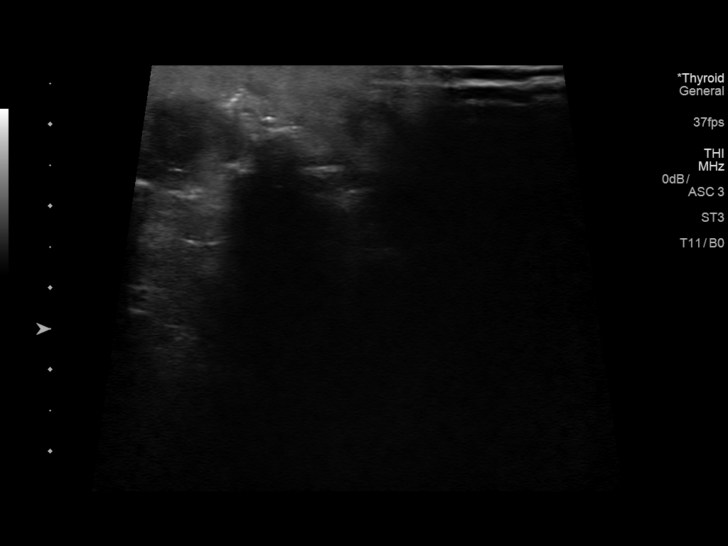
[im 4/29]
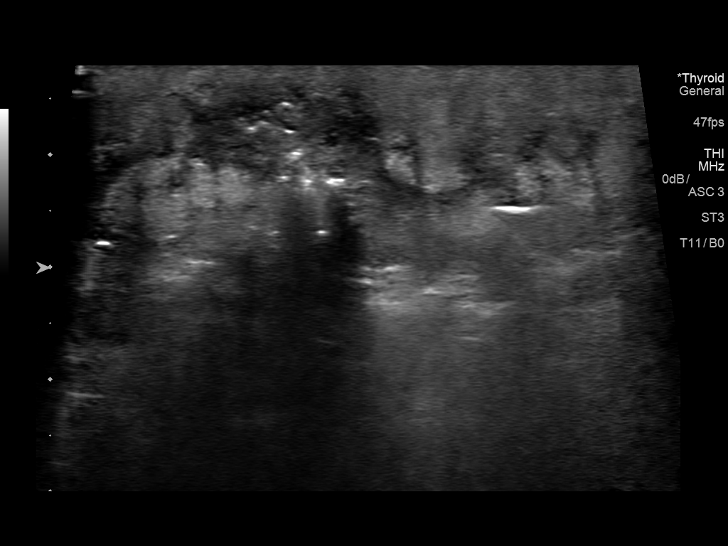
[im 8/29]
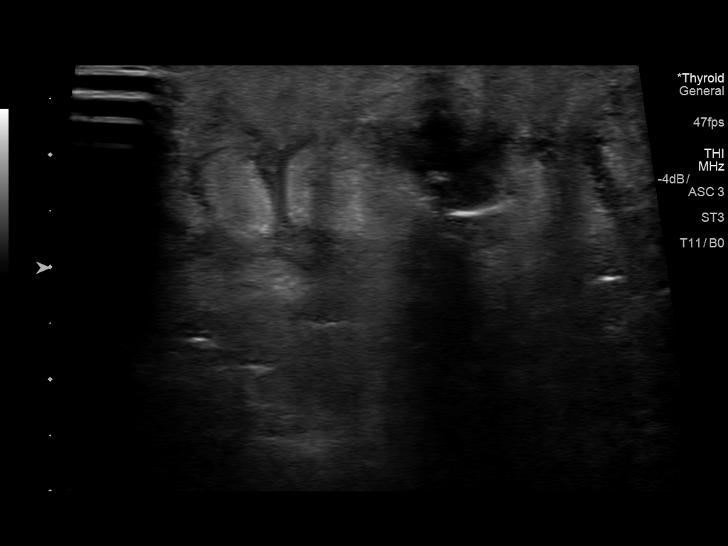
[im 10/29]
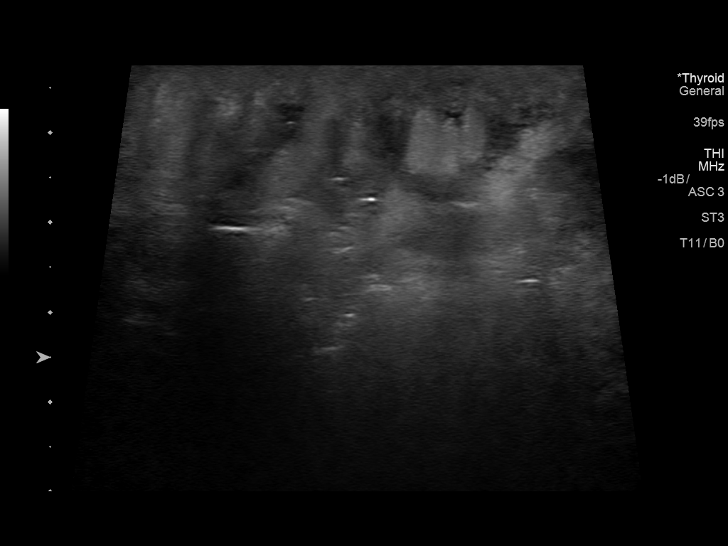
[im 12/29]
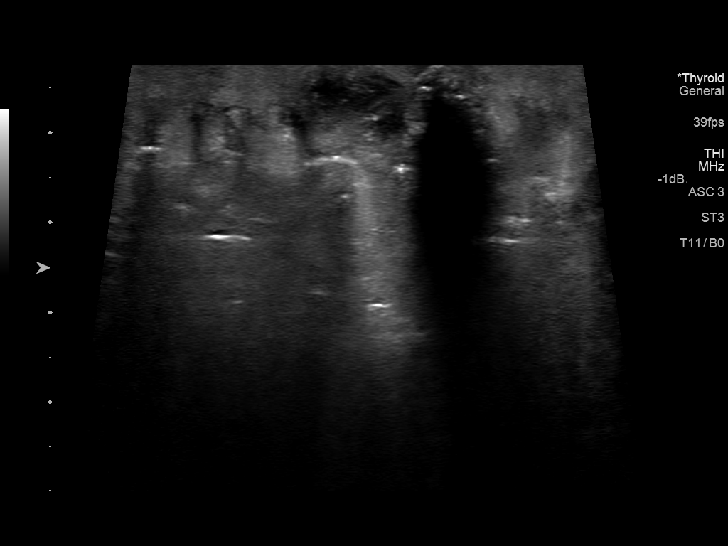
[im 14/29]
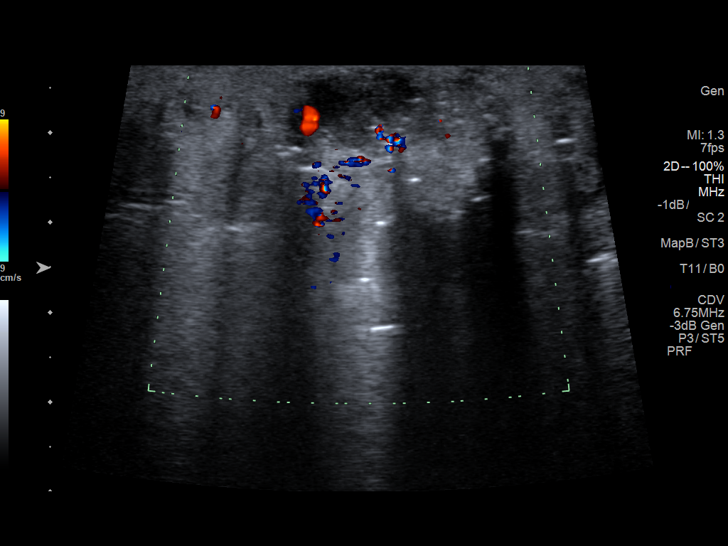
[im 15/29]
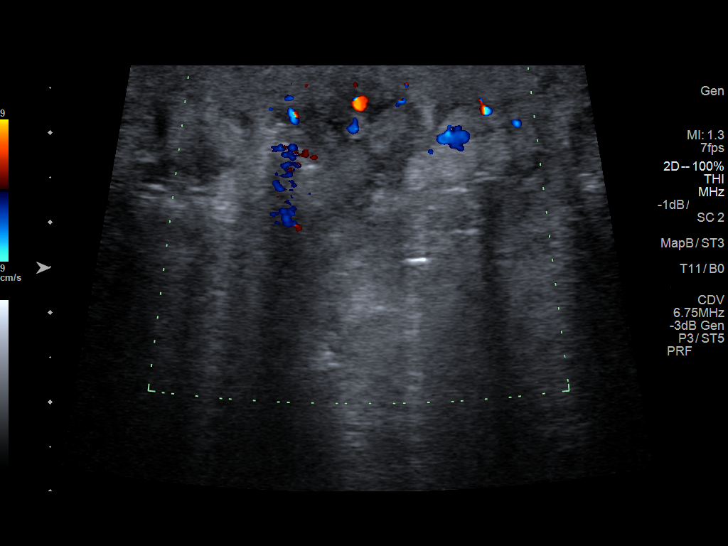
[im 17/29]
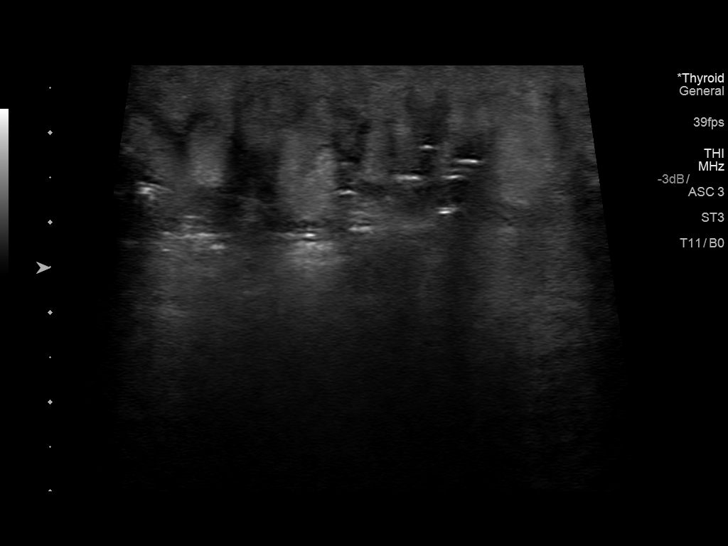
[im 19/29]
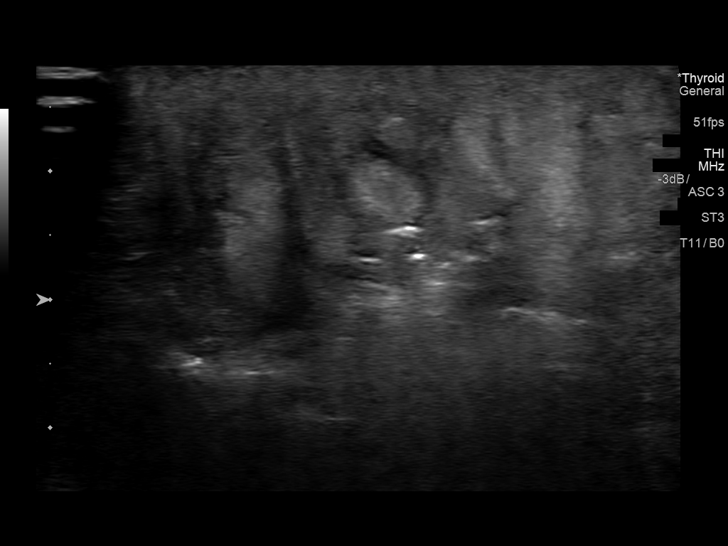
[im 23/29]
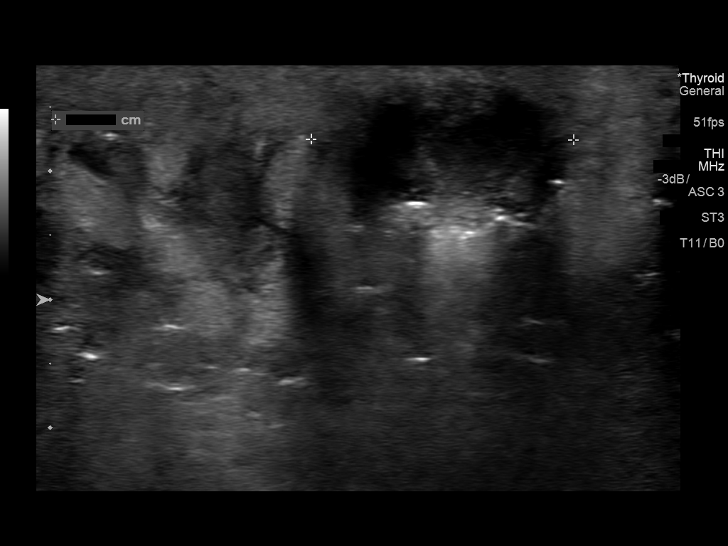
[im 25/29]
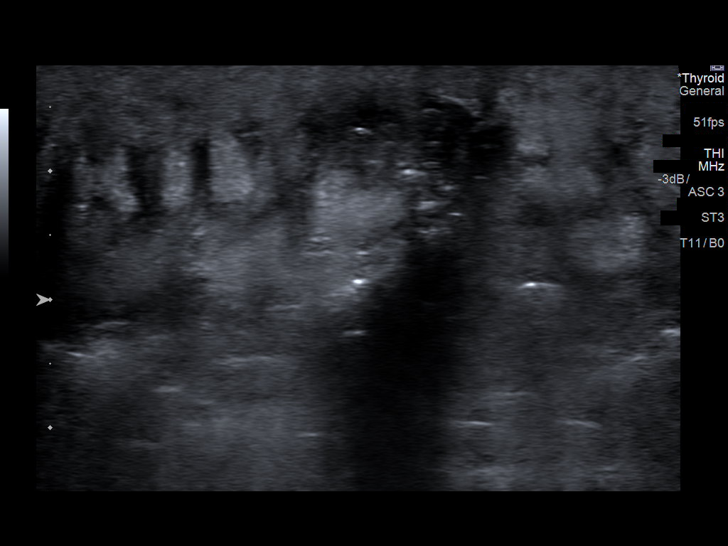
[im 27/29]
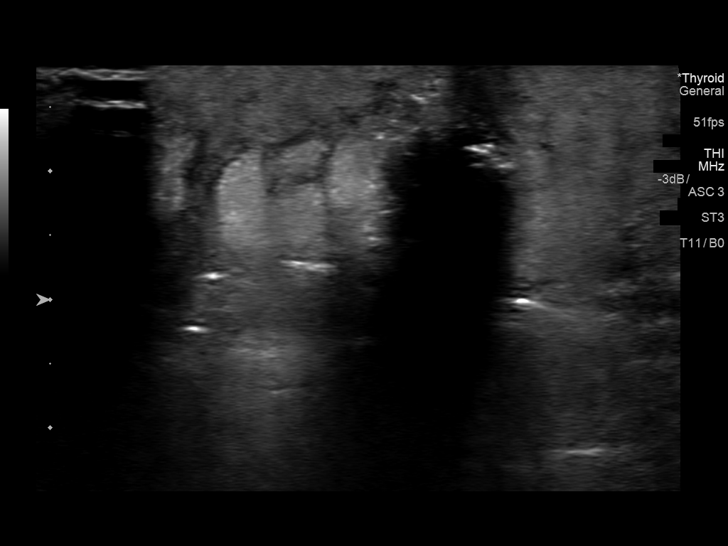
[im 29/29]
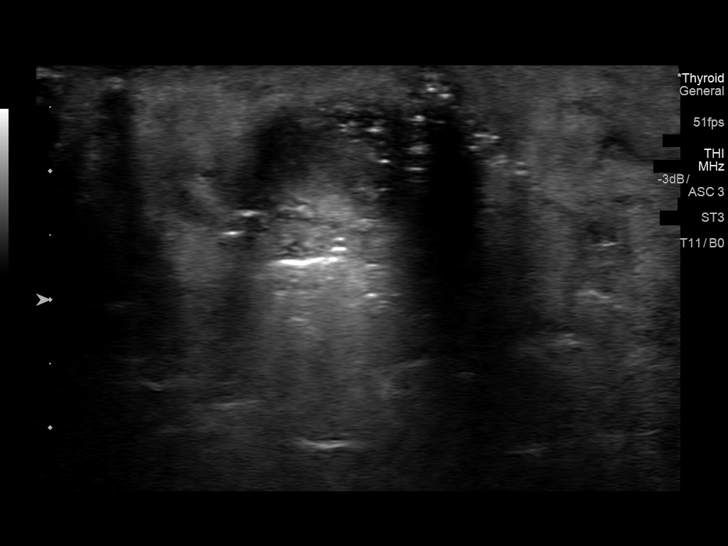

[14 of 16 positions shown; findings below may reference images not displayed]

FINDINGS: Targeted sonographic images of the posterior thoracic wall in the
region of the clinical concern was performed using grayscale and
color Doppler. A 2.2 x 1.6 x 2.0 cm complex collection is noted in
the left posterior thoracic wall in the region of the drainage.
Small echogenic foci with dirty posterior shadowing within this
collection likely represent small pockets of gas. There there is
edema in the surrounding soft tissue.
IMPRESSION: Small complex fluid collection in the left posterior thoracic wall.

## 2016-09-22 ENCOUNTER — Other Ambulatory Visit: Payer: Self-pay | Admitting: Family Medicine

## 2016-09-23 ENCOUNTER — Telehealth: Payer: Self-pay | Admitting: *Deleted

## 2016-09-23 NOTE — Telephone Encounter (Signed)
-----   Message from Tillman SersAngela C Riccio, DO sent at 09/23/2016  8:08 AM EDT -----  Patient has not followed up since April for DM and HTN can we please get her scheduled before I refill medications?

## 2016-10-05 ENCOUNTER — Ambulatory Visit (INDEPENDENT_AMBULATORY_CARE_PROVIDER_SITE_OTHER): Payer: Medicaid Other | Admitting: Family Medicine

## 2016-10-05 ENCOUNTER — Other Ambulatory Visit: Payer: Self-pay | Admitting: Family Medicine

## 2016-10-05 ENCOUNTER — Encounter: Payer: Self-pay | Admitting: Family Medicine

## 2016-10-05 VITALS — BP 144/100 | HR 83 | Temp 98.2°F | Ht 64.0 in | Wt 242.0 lb

## 2016-10-05 DIAGNOSIS — E1165 Type 2 diabetes mellitus with hyperglycemia: Secondary | ICD-10-CM | POA: Diagnosis not present

## 2016-10-05 DIAGNOSIS — I10 Essential (primary) hypertension: Secondary | ICD-10-CM

## 2016-10-05 DIAGNOSIS — Z794 Long term (current) use of insulin: Secondary | ICD-10-CM

## 2016-10-05 DIAGNOSIS — E118 Type 2 diabetes mellitus with unspecified complications: Secondary | ICD-10-CM | POA: Diagnosis present

## 2016-10-05 DIAGNOSIS — Z23 Encounter for immunization: Secondary | ICD-10-CM | POA: Diagnosis not present

## 2016-10-05 DIAGNOSIS — E782 Mixed hyperlipidemia: Secondary | ICD-10-CM | POA: Diagnosis not present

## 2016-10-05 DIAGNOSIS — IMO0002 Reserved for concepts with insufficient information to code with codable children: Secondary | ICD-10-CM

## 2016-10-05 LAB — LIPID PANEL
Cholesterol: 189 mg/dL (ref 125–200)
HDL: 61 mg/dL (ref >=46)
LDL Cholesterol: 107 mg/dL (ref <130)
Total CHOL/HDL Ratio: 3.1 Ratio (ref <=5.0)
Triglycerides: 103 mg/dL (ref <150)
VLDL: 21 mg/dL (ref <30)

## 2016-10-05 LAB — POCT GLYCOSYLATED HEMOGLOBIN (HGB A1C): HEMOGLOBIN A1C: 11.2

## 2016-10-05 NOTE — Assessment & Plan Note (Addendum)
  Not at goal of <140/<90. Repeat BP 144/100.   Patient unwilling to add more medications at this time. She agrees to take her current medications as prescribed as she forgets them more often than takes them Will try medication adherence and lifestyle modifications Follow up in 1 month, will add BP medication at this time if still above goal Patient verbalized agreement and understanding

## 2016-10-05 NOTE — Patient Instructions (Addendum)
It was great meeting you today!  Please take your medications as prescribed. Setting alarms on your phone can be a good way to remember.  Exercise at least 3 times a week for 30 minutes each time  I'll see you back in 1 month!  Diet Recommendations for Diabetes   Starchy (carb) foods: Bread, rice, pasta, potatoes, corn, cereal, grits, crackers, bagels, muffins, all baked goods.  (Fruits, milk, and yogurt also have carbohydrate, but most of these foods will not spike your blood sugar as the starchy foods will.)  A few fruits do cause high blood sugars; use small portions of bananas (limit to 1/2 at a time), grapes, watermelon, oranges, and most tropical fruits.    Protein foods: Meat, fish, poultry, eggs, dairy foods, and beans such as pinto and kidney beans (beans also provide carbohydrate).   1. Eat at least 3 meals and 1-2 snacks per day. Never go more than 4-5 hours while awake without eating. Eat breakfast within the first hour of getting up.   2. Limit starchy foods to TWO per meal and ONE per snack. ONE portion of a starchy  food is equal to the following:   - ONE slice of bread (or its equivalent, such as half of a hamburger bun).   - 1/2 cup of a "scoopable" starchy food such as potatoes or rice.   - 15 grams of carbohydrate as shown on food label.  3. Include at every meal: a protein food, a carb food, and vegetables and/or fruit.   - Obtain twice the volume of veg's as protein or carbohydrate foods for both lunch and dinner.   - Fresh or frozen veg's are best.   - Keep frozen veg's on hand for a quick vegetable serving.

## 2016-10-05 NOTE — Progress Notes (Signed)
    Subjective:    Patient ID: Rebecca Rhodes, female    DOB: 08/28/1981, 35 y.o.   MRN: [id number]   CC: here to follow up on HTN and DM  DM Was diagnosed 3-4 years ago. Supposed to take Lantus 60 units daily and Novolog 20 units TID with meals. Reports missing Lantus 2-3 times a week due to forgetting. Also misses Novolog about 2 times a week for same reason. Will check blood sugar if she feels like her sugar is low- feels tired, hot, hungry. Lowest she has seen is 60, highest was 400. Reports occasional increased thirst and increased urination. Feels her diet is poor. Does not exercise. Feels she could be more consistent with taking medicines. Thinks setting phone reminders would help. Saw eye doctor last year. Denies numbness/tingling in hands or feet. No changes in vision.   HTN Often forgets to take blood pressure medications. Denies chest pain, shortness of breath, DOE, lightheadedness. Does not check her blood pressure at home. Reports a strong family history of DM and HTN.   Hyperlipidemia Takes atorvastatin occasionally, has not had labs checked in over 4 years   Smoking status reviewed- non-smoker  Review of Systems- see HPI   Objective:  BP (!) 144/100   Pulse 83   Temp 98.2 F (36.8 C) (Oral)   Ht 5\' 4"  (1.626 m)   Wt 242 lb (109.8 kg)   BMI 41.54 kg/m  Vitals and nursing note reviewed  General: obese, in no acute distress Neck: supple, non-tender, without lymphadenopathy Cardiac: RRR, clear S1 and S2, no murmurs, rubs, or gallops Respiratory: clear to auscultation bilaterally, no increased work of breathing Extremities: no edema or cyanosis. Warm, well perfused. Skin: warm and dry, no rashes noted Neuro: alert and oriented, no focal deficits   Assessment & Plan:    HTN (hypertension)  Not at goal of <140/<90. Repeat BP 144/100.   Patient unwilling to add more medications at this time. She agrees to take her current medications as prescribed as she forgets  them more often than takes them Will try medication adherence and lifestyle modifications Follow up in 1 month, will add BP medication at this time if still above goal Patient verbalized agreement and understanding   Diabetes mellitus type 2, uncontrolled, with complications (HCC)  A1C today down to 11.2 from 13.2 in April Diet counseling given Recommended 30 minutes of exercise 3x a week at least Patient agreed to set reminders on her phone to take medications consistently Will keep fasting sugar log Follow up in 1 month to assess progress  Hyperlipidemia Has not had lipid panel since 2013 Will repeat today, patient is not fasting  Flu shot today  Return in about 4 weeks (around 11/02/2016).   Dolores PattyAngela Vantasia Pinkney, DO Family Medicine Resident PGY-1

## 2016-10-05 NOTE — Assessment & Plan Note (Addendum)
  A1C today down to 11.2 from 13.2 in April Diet counseling given Recommended 30 minutes of exercise 3x a week at least Patient agreed to set reminders on her phone to take medications consistently Will keep fasting sugar log Follow up in 1 month to assess progress

## 2016-10-05 NOTE — Telephone Encounter (Signed)
Patient has appointment with PCP today.Rebecca Rhodes, CMA

## 2016-10-05 NOTE — Assessment & Plan Note (Signed)
Has not had lipid panel since 2013 Will repeat today, patient is not fasting

## 2016-11-02 ENCOUNTER — Encounter: Payer: Self-pay | Admitting: Family Medicine

## 2016-11-02 ENCOUNTER — Ambulatory Visit (INDEPENDENT_AMBULATORY_CARE_PROVIDER_SITE_OTHER): Payer: Medicaid Other | Admitting: Family Medicine

## 2016-11-02 VITALS — BP 150/108 | HR 115 | Temp 98.1°F | Ht 64.0 in | Wt 246.0 lb

## 2016-11-02 DIAGNOSIS — Z794 Long term (current) use of insulin: Secondary | ICD-10-CM | POA: Diagnosis not present

## 2016-11-02 DIAGNOSIS — E118 Type 2 diabetes mellitus with unspecified complications: Secondary | ICD-10-CM | POA: Diagnosis not present

## 2016-11-02 DIAGNOSIS — I10 Essential (primary) hypertension: Secondary | ICD-10-CM

## 2016-11-02 DIAGNOSIS — E1165 Type 2 diabetes mellitus with hyperglycemia: Secondary | ICD-10-CM | POA: Diagnosis not present

## 2016-11-02 DIAGNOSIS — IMO0002 Reserved for concepts with insufficient information to code with codable children: Secondary | ICD-10-CM

## 2016-11-02 MED ORDER — METOPROLOL SUCCINATE ER 25 MG PO TB24: 25.0000 mg | ORAL_TABLET | Freq: Every day | ORAL | 3 refills | Status: DC

## 2016-11-02 MED ORDER — LISINOPRIL-HYDROCHLOROTHIAZIDE 20-12.5 MG PO TABS: 1.0000 | ORAL_TABLET | Freq: Every day | ORAL | 1 refills | Status: DC

## 2016-11-02 NOTE — Patient Instructions (Signed)
  Stop taking Lisinopril and Metoprolol Tartrate   Stat taking Lisinopril-HCTZ and Metoprolol Succinate (ER)   I'll see you back in 1 month!

## 2016-11-02 NOTE — Assessment & Plan Note (Signed)
  Has sporadically checked fasting sugars, was 300 this morning. Denies missing insulin doses.  -continue current dose of 60 units Lantus every night and 20 units Novolog TID -recheck A1C in January

## 2016-11-02 NOTE — Progress Notes (Signed)
   Subjective:    Patient ID: Rebecca Rhodes, female    DOB: 07/01/1981, 35 y.o.   MRN: [id number]   CC: here for DM and HTN follow up  DM Has been taking Novolog 20 units 3 times a day and Lantus 60 units at bedtime. Denies symptoms of hypoglycemia. She was initially checking fasting sugar every morning with values ranging 60-120's but then stopped checking. Checked fasting sugar this morning before appointment and it was 300. Ate 3 hot dogs yesterday (did not feel well due to URI).   HTN Has been taking her medications as prescribed. Lost metoprolol bottle today so did not take that medicine this morning. She is feeling wlel and has been walking daily to and from daughter's school every morning and afternoon (total about 20 minutes daily). Denies chest pain, shortness of breath, headaches.    Smoking status reviewed- non-smoker  Review of Systems- see HPI   Objective:  BP (!) 150/108   Pulse (!) 115   Temp 98.1 F (36.7 C) (Oral)   Ht 5\' 4"  (1.626 m)   Wt 246 lb (111.6 kg)   LMP 10/02/2016 (Approximate)   BMI 42.23 kg/m  Vitals and nursing note reviewed  General: well nourished, in no acute distress HEENT: normocephalic, no nasal discharge, moist mucous membranes, good dentition without erythema or discharge noted in posterior oropharynx Cardiac: RRR, clear S1 and S2, no murmurs, rubs, or gallops Respiratory: clear to auscultation bilaterally, no increased work of breathing Extremities: no edema or cyanosis. Warm, well perfused Skin: warm and dry, no rashes noted Neuro: alert and oriented, no focal deficits   Assessment & Plan:    Essential hypertension  Not at goal of <140/90  -Add 12.5 HCTZ today -Change to metoprolol ER once daily as patient has difficulty remembering second dose -follow up in 1 month  Diabetes mellitus type 2, uncontrolled, with complications (HCC)  Has sporadically checked fasting sugars, was 300 this morning. Denies missing insulin  doses.  -continue current dose of 60 units Lantus every night and 20 units Novolog TID -recheck A1C in January    Return in about 4 weeks (around 11/30/2016).   Dolores PattyAngela Riccio, DO Family Medicine Resident PGY-1

## 2016-11-02 NOTE — Assessment & Plan Note (Signed)
  Not at goal of <140/90  -Add 12.5 HCTZ today -Change to metoprolol ER once daily as patient has difficulty remembering second dose -follow up in 1 month

## 2016-12-07 ENCOUNTER — Ambulatory Visit: Payer: Medicaid Other | Admitting: Family Medicine

## 2016-12-21 ENCOUNTER — Encounter: Payer: Self-pay | Admitting: Family Medicine

## 2016-12-21 ENCOUNTER — Ambulatory Visit (INDEPENDENT_AMBULATORY_CARE_PROVIDER_SITE_OTHER): Payer: Medicaid Other | Admitting: Family Medicine

## 2016-12-21 VITALS — BP 152/90 | HR 80 | Temp 97.9°F | Ht 64.0 in | Wt 253.0 lb

## 2016-12-21 DIAGNOSIS — E118 Type 2 diabetes mellitus with unspecified complications: Secondary | ICD-10-CM | POA: Diagnosis not present

## 2016-12-21 DIAGNOSIS — E1165 Type 2 diabetes mellitus with hyperglycemia: Secondary | ICD-10-CM

## 2016-12-21 DIAGNOSIS — IMO0002 Reserved for concepts with insufficient information to code with codable children: Secondary | ICD-10-CM

## 2016-12-21 DIAGNOSIS — N926 Irregular menstruation, unspecified: Secondary | ICD-10-CM | POA: Diagnosis not present

## 2016-12-21 DIAGNOSIS — I10 Essential (primary) hypertension: Secondary | ICD-10-CM | POA: Diagnosis not present

## 2016-12-21 DIAGNOSIS — Z794 Long term (current) use of insulin: Secondary | ICD-10-CM

## 2016-12-21 LAB — POCT GLYCOSYLATED HEMOGLOBIN (HGB A1C): Hemoglobin A1C: 11.5

## 2016-12-21 LAB — POCT URINE PREGNANCY: PREG TEST UR: NEGATIVE

## 2016-12-21 MED ORDER — LISINOPRIL-HYDROCHLOROTHIAZIDE 20-25 MG PO TABS: 1.0000 | ORAL_TABLET | Freq: Every day | ORAL | 3 refills | Status: DC

## 2016-12-21 NOTE — Patient Instructions (Addendum)
  Goals:  1. Cut down on sugary drinks and replace them with water 2. Exercise- let's aim to walk 30 minutes 3 times a week 3. Continue to remember taking medicines in the morning.  I'll see you back in 1 month!  See Dr. Raymondo BandKoval (pharmacy) Thursday 1/18 at 3:30pm  Once you get the new blood pressure medicine, call to make a lab appointment for electrolyte check and a nurse visit to check BP one week after starting.

## 2016-12-21 NOTE — Assessment & Plan Note (Signed)
  Continues to have poor control. A1C today 11.5. Takes Lantus daily but often misses doses of Novolog  -continue current regimen -set up to see Dr. Raymondo BandKoval to discuss changes in therapy, she has difficulty taking medications later in the day due to forgetting but does well with remembering morning medications. Would like to add on an oral agent in the morning possibly

## 2016-12-21 NOTE — Progress Notes (Signed)
    Subjective:    Patient ID: Rebecca Rhodes, female    DOB: 10/12/1981, 36 y.o.   MRN: [id number]   CC: here for follow up on DM. Has cut on foot.   DM Admits to poor adherence with novolog. Takes 20 units with breakfast most days. Usually misses lunch. Will remember dinner more often but not every day. Does take Lantus every morning 60 units. Does not miss this dose. Denies increased thirst, hunger. No loss of sensation in hands or feet. No vision changes. Does want to do better with her diet. Is trying to drink more water and cut back on sugary drinks. Drinks a lot of soda, juice, and sweet tea. Drinks 4-5 sugary drinks a day. Drinks about 64 ounces of water a day. Does not exercise but thinks she would be able to start walking for exercise.   HTN Doing better with taking XR metoprolol every morning. Has only missed 1 dose in past month. Takes lisinopril/hctz every morning. Only missed 1 dose in past month. No headaches, vision changes, chest pains.  Foot Cut Has had dry calloused feet and was picking at her foot two days ago, cut her foot. Was very painful yesterday as she was walking around a lot. Less painful today. No drainage. Bleeding occasionally. Used hydrogen peroxide and first aid spray on it. Does not have any neuropathy in feet.  Concern for pregnancy Patient has not had period since late November/early December. Has not taken home pregnancy test. Not currently trying to get pregnant. Would like pregnancy test today.   Smoking status reviewed- non-smoker  Review of Systems- see HPI   Objective:  BP (!) 152/90   Pulse 80   Temp 97.9 F (36.6 C) (Oral)   Ht 5\' 4"  (1.626 m)   Wt 253 lb (114.8 kg)   LMP 11/09/2016 (Approximate)   SpO2 98%   BMI 43.43 kg/m  Vitals and nursing note reviewed  General: obese 36 year old lady in no acute distress Cardiac: RRR, clear S1 and S2, no murmurs, rubs, or gallops Respiratory: clear to auscultation bilaterally, no increased work of  breathing Abdomen: obese abdomen, soft, non-tender Extremities: no edema or cyanosis. Small superficial cut on sole of right foot. No erythema or pain, no discharge, no fluctuance.  Neuro: alert and oriented, no focal deficits   Assessment & Plan:    HTN (hypertension)  BP elevated x 2 today at 150s/90s  -change to Lisinopril/HCTZ 20-25 -BP check in 1 week, check BMP in 1 week after starting medication -follow up in 1 month  Diabetes mellitus type 2, uncontrolled, with complications (HCC)  Continues to have poor control. A1C today 11.5. Takes Lantus daily but often misses doses of Novolog  -continue current regimen -set up to see Dr. Raymondo BandKoval to discuss changes in therapy, she has difficulty taking medications later in the day due to forgetting but does well with remembering morning medications. Would like to add on an oral agent in the morning possibly  Missed period  Urine pregnancy test today is negative Follow up 1 month, will discuss birth control further at this visit  Cut on R Foot Small abrasion, no signs of infection, anticipate this will heal on its own without any intervention  See Dr. Raymondo BandKoval in pharmacy clinic in 1 week BP check and BMP 1 week after starting increased HCTZ Return in about 4 weeks (around 01/18/2017).   Dolores PattyAngela Sue Mcalexander, DO Family Medicine Resident PGY-1

## 2016-12-21 NOTE — Assessment & Plan Note (Signed)
  BP elevated x 2 today at 150s/90s  -change to Lisinopril/HCTZ 20-25 -BP check in 1 week, check BMP in 1 week after starting medication -follow up in 1 month

## 2016-12-21 NOTE — Assessment & Plan Note (Signed)
  Urine pregnancy test today is negative Follow up 1 month, will discuss birth control further at this visit

## 2016-12-29 ENCOUNTER — Ambulatory Visit: Payer: Medicaid Other | Admitting: Pharmacist

## 2017-01-18 ENCOUNTER — Ambulatory Visit: Payer: Medicaid Other | Admitting: Family Medicine

## 2017-03-16 ENCOUNTER — Other Ambulatory Visit: Payer: Self-pay | Admitting: *Deleted

## 2017-03-16 MED ORDER — INSULIN GLARGINE 100 UNIT/ML ~~LOC~~ SOLN: 60.0000 [IU] | Freq: Every day | SUBCUTANEOUS | 11 refills | Status: DC

## 2017-03-16 MED ORDER — ATORVASTATIN CALCIUM 40 MG PO TABS: 40.0000 mg | ORAL_TABLET | Freq: Every day | ORAL | 11 refills | Status: DC

## 2017-03-16 MED ORDER — GLUCOSE BLOOD VI STRP: ORAL_STRIP | 11 refills | Status: AC

## 2017-03-16 MED ORDER — "INSULIN SYRINGE 31G X 5/16"" 1 ML MISC": 11 refills | Status: AC

## 2017-03-16 MED ORDER — ASPIRIN 81 MG PO CHEW: 81.0000 mg | CHEWABLE_TABLET | Freq: Every day | ORAL | 11 refills | Status: DC

## 2017-03-16 MED ORDER — INSULIN ASPART 100 UNIT/ML ~~LOC~~ SOLN: 20.0000 [IU] | Freq: Three times a day (TID) | SUBCUTANEOUS | 11 refills | Status: DC

## 2017-03-16 MED ORDER — ACCU-CHEK SOFTCLIX LANCETS MISC: 12 refills | Status: AC

## 2017-04-21 ENCOUNTER — Telehealth: Payer: Self-pay | Admitting: *Deleted

## 2017-04-21 NOTE — Telephone Encounter (Signed)
Scheduled patient for 3pm on the 17th and asked her to bring her medications with her. Rebecca Rhodes,CMA

## 2017-04-21 NOTE — Telephone Encounter (Signed)
-----   Message from Tillman SersAngela C Riccio, DO sent at 04/21/2017 10:42 AM EDT ----- Regarding: [facility] clinic  Would Ms. Rebecca Rhodes be available to come in 5/17 for TOPC appointment? Thanks!

## 2017-04-21 NOTE — Telephone Encounter (Signed)
LM for patient or family to call back.  Will try calling again later today. Jazmin Hartsell,CMA

## 2017-04-27 ENCOUNTER — Ambulatory Visit: Payer: Medicaid Other | Admitting: Family Medicine

## 2017-07-17 ENCOUNTER — Ambulatory Visit (INDEPENDENT_AMBULATORY_CARE_PROVIDER_SITE_OTHER): Payer: Medicaid Other | Admitting: Family Medicine

## 2017-07-17 ENCOUNTER — Encounter: Payer: Self-pay | Admitting: Family Medicine

## 2017-07-17 VITALS — BP 146/100 | HR 97 | Temp 98.6°F | Ht 64.0 in | Wt 250.0 lb

## 2017-07-17 DIAGNOSIS — E1165 Type 2 diabetes mellitus with hyperglycemia: Secondary | ICD-10-CM | POA: Diagnosis not present

## 2017-07-17 DIAGNOSIS — I10 Essential (primary) hypertension: Secondary | ICD-10-CM

## 2017-07-17 DIAGNOSIS — IMO0002 Reserved for concepts with insufficient information to code with codable children: Secondary | ICD-10-CM

## 2017-07-17 DIAGNOSIS — E118 Type 2 diabetes mellitus with unspecified complications: Secondary | ICD-10-CM

## 2017-07-17 DIAGNOSIS — Z794 Long term (current) use of insulin: Secondary | ICD-10-CM

## 2017-07-17 LAB — POCT GLYCOSYLATED HEMOGLOBIN (HGB A1C): HEMOGLOBIN A1C: 12

## 2017-07-17 MED ORDER — METFORMIN HCL ER 500 MG PO TB24: 500.0000 mg | ORAL_TABLET | Freq: Every day | ORAL | 0 refills | Status: DC

## 2017-07-17 MED ORDER — GLIMEPIRIDE 2 MG PO TABS: 2.0000 mg | ORAL_TABLET | Freq: Every day | ORAL | 0 refills | Status: DC

## 2017-07-17 MED ORDER — ACCU-CHEK AVIVA PLUS W/DEVICE KIT: PACK | 0 refills | Status: AC

## 2017-07-17 NOTE — Progress Notes (Signed)
    Subjective:    Patient ID: Rebecca Rhodes, female    DOB: 02/16/1981, 36 y.o.   MRN: [id number]   CC: here for TOPc  DM -has not been checking sugars because she lost glucometer "in her house" -often forgets Lantus/novolog- will take morning dose but miss rest of novolog doses -reports taking 60 units of novolog every morning, none w/ lunch or dinner -reports never eating breakfast -diabetes screening tool used: denies having problem understanding DM, a1c goal is <7, reports missing DM meds 2x in past week. Denies difficulty with ride to clinic, difficulty affording meds, no risk of homelessness, no issue getting food, no safety issues at home. No one helps her with DM.  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 Lives with MIL, boyfriend, 37 year old daughter, 1 dog in [place] Uses bus to get around or MIL's car [place] medicaid [place], he is in good health Father is deceased, Mother recently dx with DM and has had HTN for a long time  Stressors/barriers 37 year old daughter Boyfriend does not help much at home, has hx of "accident that impaired memory" Lives with mother-in-law, works for her, relies on her for transportation Dislikes job Chronic illness is overwhelming- declines speaking with therapist about this Identifies upcoming change in her daughter's schedule to be a barrier to remembering meds, upcoming holidays and vacations as being a barrier as well  Smoking status reviewed- never smoker  Review of Systems- see HPI   Objective:  BP (!) 146/100   Pulse 97   Temp 98.6 F (37 C) (Oral)   Ht 5\' 4"  (1.626  m)   Wt 250 lb (113.4 kg)   LMP 07/08/2017 (Approximate)   SpO2 96%   BMI 42.91 kg/m  Vitals and nursing note reviewed  General: well nourished, in no acute distress Cardiac: RRR, clear S1 and S2, no murmurs, rubs, or gallops Respiratory: clear to auscultation bilaterally, no increased work of breathing Extremities: no edema or cyanosis Neuro: alert and oriented, no focal deficits  Assessment & Plan:    Diabetes mellitus type 2, uncontrolled, with complications (HCC)  Uncontrolled secondary to medication non-adherence. A1C today 12. Taking 60 units Lantus every morning and 60 units Novolog every morning. Not checking CBG currently. Patient's goal for next A1C is 9 or better.   -discontinue Novolog -continue Lantus 60 units every morning -add Metforin XR 500 mg and Amaryl 2mg  every morning  -rx for new glucometer sent to pharmacy -follow up via phone in 2 weeks -follow up with me in clinic in 1 month -consider work up for secondary causes of diabetes at that time ([place]) -ophthalmology records requested from Dr. Minna Merrittsoy Whitaker's office  HTN (hypertension)  Chronic, not at goal secondary to not taking medication past 1 week.   -encouraged compliance -discussed ways to remember taking BP meds every morning- keep with insulin, use calendar on fridge with check marks, pill box, etc -check BMP/labs at next visit -consider UDS at next visit    Return in about 4 weeks (around 08/14/2017).   Dolores PattyAngela Aleera Gilcrease, DO Family Medicine Resident PGY-2

## 2017-07-17 NOTE — Assessment & Plan Note (Addendum)
  Uncontrolled secondary to medication non-adherence. A1C today 12. Taking 60 units Lantus every morning and 60 units Novolog every morning. Not checking CBG currently. Patient's goal for next A1C is 9 or better.   -discontinue Novolog -continue Lantus 60 units every morning -add Metforin XR 500 mg and Amaryl 2mg  every morning  -rx for new glucometer sent to pharmacy -follow up via phone in 2 weeks -follow up with me in clinic in 1 month -consider work up for secondary causes of diabetes at that time ([place]) -ophthalmology records requested from Dr. Minna Merrittsoy Whitaker's office

## 2017-07-17 NOTE — Assessment & Plan Note (Signed)
  Chronic, not at goal secondary to not taking medication past 1 week.   -encouraged compliance -discussed ways to remember taking BP meds every morning- keep with insulin, use calendar on fridge with check marks, pill box, etc -check BMP/labs at next visit -consider UDS at next visit

## 2017-07-17 NOTE — Patient Instructions (Addendum)
  Let's STOP taking the novolin (the short acting insulin) in the morning. Continue taking Lantus 60 units every morning.  Try keeping track of when you take your medicines with a check mark on the calendar day!  Try keeping your medicines in a place you will see them every morning and remember to take them.   Start with 1/2 tablet of metformin for the next several days then go up to a whole tablet if your stomach does ok. Once you are used to a whole tablet you can go up to 2 tablets.   You can take the Amaryl daily every morning, no need to titrate that.   Please try to check your blood sugar in the morning before you have eaten anything to make sure your sugar isn't too low in the morning.  If you have questions or concerns please do not hesitate to call at [phone].  Dolores PattyAngela Eean Buss, DO PGY-2, [facility] 07/17/2017 3:37 PM

## 2017-08-04 ENCOUNTER — Telehealth: Payer: Self-pay | Admitting: *Deleted

## 2017-08-04 NOTE — Telephone Encounter (Signed)
2 week f/u phone call:  Spoke with patient to learn how she is feeling. States she's feeling, "great." Reports taking lantus 60 units q AM in addition to Metformin XR and amaryl q AM. Has also restarted lisinopril-HCTZ and metoprolol q AM. Has stopped novolog as directed. Insurance would not cover another glucometer but states she located her previous one. Has not yet checked BG. Encouraged to start today before meals and AM fasting and once in awhile 2 hours after largest meal of day, record and bring log to all future visits. States she usually only eats 1-2 meals a day and always skips breakfast. Encouraged to try eating breakfast as metformin should be taken with food. Encouraged not to go more than 4-5 hours without eating. Patient has no questions or concerns at this time. Reminded of f/u with PCP on 9/7 at 3:35. Kinnie Feil, RN, BSN

## 2017-08-18 ENCOUNTER — Ambulatory Visit (INDEPENDENT_AMBULATORY_CARE_PROVIDER_SITE_OTHER): Payer: Medicaid Other | Admitting: Family Medicine

## 2017-08-18 ENCOUNTER — Encounter: Payer: Self-pay | Admitting: Family Medicine

## 2017-08-18 VITALS — BP 122/82 | HR 91 | Temp 98.4°F | Wt 249.0 lb

## 2017-08-18 DIAGNOSIS — Z794 Long term (current) use of insulin: Secondary | ICD-10-CM

## 2017-08-18 DIAGNOSIS — I248 Other forms of acute ischemic heart disease: Secondary | ICD-10-CM

## 2017-08-18 DIAGNOSIS — E118 Type 2 diabetes mellitus with unspecified complications: Secondary | ICD-10-CM

## 2017-08-18 DIAGNOSIS — IMO0002 Reserved for concepts with insufficient information to code with codable children: Secondary | ICD-10-CM

## 2017-08-18 DIAGNOSIS — I10 Essential (primary) hypertension: Secondary | ICD-10-CM

## 2017-08-18 DIAGNOSIS — E1165 Type 2 diabetes mellitus with hyperglycemia: Secondary | ICD-10-CM | POA: Diagnosis not present

## 2017-08-18 DIAGNOSIS — I2489 Other forms of acute ischemic heart disease: Secondary | ICD-10-CM

## 2017-08-18 NOTE — Assessment & Plan Note (Signed)
  Chronic, stable, at goal today  -continue current regimen -check BMP today -follow up 1 month

## 2017-08-18 NOTE — Progress Notes (Signed)
    Subjective:    Patient ID: Rebecca Rhodes, female    DOB: 10/05/1981, 36 y.o.   MRN: [id number]   CC: DM fup  DM Doing well with remembering medications- takes lantus 60 units every morning then when she gets to work she takes her pills- metformin and amaryl along with statin, BP medication, aspirin. Has been having bad headaches around noon and will check her sugar at that time. Then she may recheck in an hour. The sugar will be very high like 400's, one time just read as "high". The lowest sugar she saw was 120. Denies hypoglycemic symptoms. She does not check her sugar if she feels well only if she has a headache or other symptom. She will self dose novolog 60 units if her meter reads >400. She only eats lunch and dinner. She has tried to get more exercise in. She endorses blurred vision, increased thirst. Denies polydipsia, increased hunger.   HTN Has been taking BP medications daily and reports only missing occasionally on weekends when her schedule is off. She does endorse intermittent chest pain when walking home from bus. This has happened 2-3 times over past several weeks. Resolved with rest. Takes daily aspirin. Has seen cardiology in the past but not in recent year. Has had normal stress test in past. Does not have chest pain currently.   Smoking status reviewed- non-smoker  Review of Systems- see HPI   Objective:  BP 122/82   Pulse 91   Temp 98.4 F (36.9 C) (Oral)   Wt 249 lb (112.9 kg)   LMP 08/05/2017   SpO2 97%   BMI 42.74 kg/m  Vitals and nursing note reviewed  General: well nourished, in no acute distress Cardiac: RRR, clear S1 and S2, no murmurs, rubs, or gallops Respiratory: clear to auscultation bilaterally, no increased work of breathing Extremities: no edema or cyanosis. Neuro: alert and oriented, no focal deficits  Assessment & Plan:    HTN (hypertension)  Chronic, stable, at goal today  -continue current regimen -check BMP today -follow up 1  month  Diabetes mellitus type 2, uncontrolled, with complications (HCC)  Chronic, poorly controlled. Patient only checking CBGs with headaches but getting readings >400 then self dosing with novolog 60 units. Due for recheck of A1C in 2 months.  -increase metformin to 1000 mg every morning -increase amaryl to 4mg  daily -continue lantus 60 units every morning -encouraged patient to not self dose novolog and call me if her sugars are high -check CBGs every morning and every night  -follow up 1 month  Demand ischemia (HCC)  History of demand ischemia and patient with several risk factors for CAD. Reports some CP with exertion.   -referral made to cardiology, patient may benefit from stress testing -continue daily aspirin and statin -ED precautions given for return of chest pain    Return in about 4 weeks (around 09/15/2017).   Dolores PattyAngela Issac Moure, DO Family Medicine Resident PGY-2

## 2017-08-18 NOTE — Patient Instructions (Signed)
   It was great seeing you today!  Please take double the amount of Metformin and Glimepiride every morning. This should help get your sugars under better control.   You will hear from our referral coordinator to schedule an appointment with Cardiology.   If you have questions or concerns please do not hesitate to call at [phone].  Dolores PattyAngela Riccio, DO PGY-2, [facility] 08/18/2017 3:27 PM

## 2017-08-18 NOTE — Assessment & Plan Note (Signed)
  Chronic, poorly controlled. Patient only checking CBGs with headaches but getting readings >400 then self dosing with novolog 60 units. Due for recheck of A1C in 2 months.  -increase metformin to 1000 mg every morning -increase amaryl to  daily -continue lantus 60 units every morning -encouraged patient to not self dose novolog and call me if her sugars are high -check CBGs every morning and every night  -follow up 1 month

## 2017-08-18 NOTE — Assessment & Plan Note (Signed)
  History of demand ischemia and patient with several risk factors for CAD. Reports some CP with exertion.   -referral made to cardiology, patient may benefit from stress testing -continue daily aspirin and statin -ED precautions given for return of chest pain

## 2017-08-19 ENCOUNTER — Telehealth: Payer: Self-pay | Admitting: Internal Medicine

## 2017-08-19 LAB — BASIC METABOLIC PANEL
BUN/Creatinine Ratio: 13 (ref 9–23)
BUN: 17 mg/dL (ref 6–20)
CHLORIDE: 96 mmol/L (ref 96–106)
CO2: 20 mmol/L (ref 20–29)
Calcium: 8.9 mg/dL (ref 8.7–10.2)
Creatinine, Ser: 1.34 mg/dL — ABNORMAL HIGH (ref 0.57–1.00)
GFR, EST AFRICAN AMERICAN: 58 mL/min/{1.73_m2} — AB (ref >59)
GFR, EST NON AFRICAN AMERICAN: 51 mL/min/{1.73_m2} — AB (ref >59)
Glucose: 567 mg/dL (ref 65–99)
Potassium: 4.3 mmol/L (ref 3.5–5.2)
SODIUM: 133 mmol/L — AB (ref 134–144)

## 2017-08-19 NOTE — Telephone Encounter (Signed)
Received after hours page from [employer] regarding critical glucose level of 567.  Other results of BMET were Na 133, K 4.3, Cl 96, CO2 20, BUN 17, Cr 1.34.  Anion gap mildly elevated at 17.  Patient had been seen on 9/7 by PCP when labs were obtained.   Per review of OV note from this date, patient was in no distress and had no focal deficits or acute complaints.  Appears she frequently has elevated blood sugars greater than 400.  Review of note and medication list indicates she is supposed to be on Lantus 60 units QAM, metformin, Amaryl, and Novolog 20 units TID.  At this visit on 9/7, her PCP increased metformin to 1000mg  QAM and Amaryl to 4mg  QDay.  Her last A1c was 12 one month prior. Called patient given this abnormal result and mildly elevated anion gap, however, she did not answer the phone.  I left a message and advised her to check her sugars closely over the weekend and to call back if she were not feeling well or had any concerning symptoms of hyperglycemia.  Otherwise, informed her that PCP office would follow up on Monday.

## 2017-08-21 ENCOUNTER — Other Ambulatory Visit: Payer: Self-pay | Admitting: Family Medicine

## 2017-08-21 MED ORDER — METFORMIN HCL ER (MOD) 1000 MG PO TB24: 1000.0000 mg | ORAL_TABLET | Freq: Every day | ORAL | 2 refills | Status: DC

## 2017-08-21 MED ORDER — GLIMEPIRIDE 4 MG PO TABS: 4.0000 mg | ORAL_TABLET | Freq: Every day | ORAL | 1 refills | Status: DC

## 2017-08-21 NOTE — Telephone Encounter (Signed)
  Called Rebecca Rhodes to check in on her- she is doing well. Denies symptoms of hyperglycemia. She needs refills on her amaryl and metformin. Sent in refills at higher doses. Advised patient to check her sugars and if they are persistently elevated > 400 or just reading "high" she needs to go to the ER. She agreed. Asked her to call back with any questions or needs.

## 2017-09-13 ENCOUNTER — Other Ambulatory Visit: Payer: Self-pay | Admitting: Family Medicine

## 2017-09-15 ENCOUNTER — Ambulatory Visit (INDEPENDENT_AMBULATORY_CARE_PROVIDER_SITE_OTHER): Payer: Medicaid Other | Admitting: Family Medicine

## 2017-09-15 ENCOUNTER — Encounter: Payer: Self-pay | Admitting: Family Medicine

## 2017-09-15 VITALS — BP 118/80 | HR 79 | Temp 98.5°F | Wt 252.0 lb

## 2017-09-15 DIAGNOSIS — Z23 Encounter for immunization: Secondary | ICD-10-CM

## 2017-09-15 DIAGNOSIS — E118 Type 2 diabetes mellitus with unspecified complications: Secondary | ICD-10-CM | POA: Diagnosis present

## 2017-09-15 DIAGNOSIS — I10 Essential (primary) hypertension: Secondary | ICD-10-CM

## 2017-09-15 DIAGNOSIS — IMO0002 Reserved for concepts with insufficient information to code with codable children: Secondary | ICD-10-CM

## 2017-09-15 DIAGNOSIS — E1165 Type 2 diabetes mellitus with hyperglycemia: Secondary | ICD-10-CM | POA: Diagnosis not present

## 2017-09-15 LAB — POCT GLYCOSYLATED HEMOGLOBIN (HGB A1C): Hemoglobin A1C: 12.9

## 2017-09-15 MED ORDER — INSULIN GLARGINE 100 UNIT/ML ~~LOC~~ SOLN: 45.0000 [IU] | Freq: Two times a day (BID) | SUBCUTANEOUS | 3 refills | Status: DC

## 2017-09-15 NOTE — Assessment & Plan Note (Signed)
  Chronic, uncontrolled. A1C today worsened to 12.9.  -split lantus to 45 units BID -instructed patient to call me if she experiences high or low sugars so we can titrate insulin accordingly -continue amaryl and metformin -encouraged protein at every meal, limit carbs -encouraged adding exercise daily -follow up 1 month

## 2017-09-15 NOTE — Progress Notes (Signed)
    Subjective:    Patient ID: Rebecca Rhodes, female    DOB: 12/03/1981, 36 y.o.   MRN: [id number]   CC: follow up DM, HTN  DM- having some troubles with sugars. She has been self dosing 20 units novolog if sugars read "high". She started taking lantus at night instead of morning in effort to bring morning sugars down as she is scheduled to have dental work done but they won't do if her sugars are too high. She is taking metformin and amaryl as prescribed. She denies low sugars, denies symptomatic high sugars again. She is willing to take lantus BID where as in the past remembering to take this twice a day has been hard.   HTN- doing well, taking BP medications as prescribed. Chest pain episodes have improved, had one yesterday that resolved on its own. She denies current CP, SOB, palpitations. She is pleased her BP is under better control.   Smoking status reviewed- non-smoker  Review of Systems- see HPI   Objective:  BP 118/80   Pulse 79   Temp 98.5 F (36.9 C) (Oral)   Wt 252 lb (114.3 kg)   LMP 09/06/2017   SpO2 98%   BMI 43.26 kg/m  Vitals and nursing note reviewed  General: well nourished, in no acute distress Cardiac: RRR, clear S1 and S2, no murmurs, rubs, or gallops Respiratory: clear to auscultation bilaterally, no increased work of breathing Extremities: no edema or cyanosis. Neuro: alert and oriented, no focal deficits  Assessment & Plan:    Diabetes mellitus type 2, uncontrolled, with complications (HCC)  Chronic, uncontrolled. A1C today worsened to 12.9.  -split lantus to 45 units BID -instructed patient to call me if she experiences high or low sugars so we can titrate insulin accordingly -continue amaryl and metformin -encouraged protein at every meal, limit carbs -encouraged adding exercise daily -follow up 1 month  HTN (hypertension)  Chronic, well controlled today.  -continue current regimen -recheck BMP today as creatinine was elevated at last  check -follow up 1 month    Return in about 4 weeks (around 10/13/2017).   Dolores Patty, DO Family Medicine Resident PGY-2

## 2017-09-15 NOTE — Patient Instructions (Signed)
   It was great seeing you today! Keep up the great work with your blood pressure.   Please start doing Lantus 45 units every night and every morning. Call me if you have any questions about what your sugars are doing with this new dosage!  Please try to limit carbohydrates in your diet. Eating protein with every meal can help keep sugars stable.   Taking a brisk 15 minute walk every day will help with your sugars as well.   Continue with your other medications as you have been.  I'd like to see you back in 1 month.  If you have questions or concerns please do not hesitate to call at [phone].  Dolores Patty, DO PGY-2, [facility] 09/15/2017 2:01 PM

## 2017-09-15 NOTE — Assessment & Plan Note (Signed)
  Chronic, well controlled today.  -continue current regimen -recheck BMP today as creatinine was elevated at last check -follow up 1 month

## 2017-09-16 LAB — BASIC METABOLIC PANEL
BUN / CREAT RATIO: 10 (ref 9–23)
BUN: 11 mg/dL (ref 6–20)
CO2: 24 mmol/L (ref 20–29)
CREATININE: 1.08 mg/dL — AB (ref 0.57–1.00)
Calcium: 8.9 mg/dL (ref 8.7–10.2)
Chloride: 102 mmol/L (ref 96–106)
GFR calc non Af Amer: 66 mL/min/{1.73_m2} (ref >59)
GFR, EST AFRICAN AMERICAN: 76 mL/min/{1.73_m2} (ref >59)
Glucose: 222 mg/dL — ABNORMAL HIGH (ref 65–99)
Potassium: 3.9 mmol/L (ref 3.5–5.2)
Sodium: 139 mmol/L (ref 134–144)

## 2017-09-18 ENCOUNTER — Encounter: Payer: Self-pay | Admitting: *Deleted

## 2017-09-18 NOTE — Progress Notes (Signed)
Letter mailed to patient. Yomara Toothman,CMA  

## 2017-09-21 ENCOUNTER — Ambulatory Visit: Payer: Medicaid Other | Admitting: Cardiovascular Disease

## 2017-10-16 ENCOUNTER — Ambulatory Visit: Payer: Medicaid Other | Admitting: Family Medicine

## 2017-10-24 ENCOUNTER — Ambulatory Visit: Payer: Medicaid Other | Admitting: Cardiovascular Disease

## 2017-11-16 ENCOUNTER — Encounter (INDEPENDENT_AMBULATORY_CARE_PROVIDER_SITE_OTHER): Payer: Self-pay

## 2017-11-16 ENCOUNTER — Encounter: Payer: Self-pay | Admitting: Internal Medicine

## 2017-11-16 ENCOUNTER — Ambulatory Visit: Payer: Medicaid Other | Admitting: Internal Medicine

## 2017-11-16 VITALS — BP 144/100 | HR 76 | Ht 64.0 in | Wt 242.2 lb

## 2017-11-16 DIAGNOSIS — I10 Essential (primary) hypertension: Secondary | ICD-10-CM

## 2017-11-16 DIAGNOSIS — R079 Chest pain, unspecified: Secondary | ICD-10-CM | POA: Diagnosis not present

## 2017-11-16 DIAGNOSIS — E782 Mixed hyperlipidemia: Secondary | ICD-10-CM | POA: Diagnosis not present

## 2017-11-16 NOTE — Progress Notes (Signed)
Cardiology Office Note   Date:  11/16/2017   ID:  Rebecca Rhodes, DOB 16-Mar-1981, MRN [id number]  PCP:  Steve Rattler, DO  Cardiologist:   Dorris Carnes, MD   Pt referred by Dr Vanetta Shawl for CP    History of Present Illness: Rebecca Rhodes is a 36 y.o. female with a history of  DM and HTN   Pt has had CP  Located in center of chest  Goes away with sitting and rest  Gets worse with moving around  Started about 1 month ago  Has had on/off for a couple years   Last time she felt CP was 1wk ago  She had been walking a lot  Yesterday was active with no chest pain  Pt began taking BP meds in her 36s   Diagnosed wit hDM at age 55    Father died of heart disease in his 39s     Current Meds  Medication Sig  . ACCU-CHEK SOFTCLIX LANCETS lancets Use as instructed  . aspirin 81 MG chewable tablet Chew 1 tablet (81 mg total) by mouth daily.  Marland Kitchen atorvastatin (LIPITOR) 40 MG tablet Take 1 tablet (40 mg total) by mouth daily at 6 PM.  . Blood Glucose Monitoring Suppl (ACCU-CHEK AVIVA PLUS) w/Device KIT Check blood sugar as directed  . glimepiride (AMARYL) 4 MG tablet Take 1 tablet (4 mg total) by mouth daily before breakfast.  . glucose blood (ACCU-CHEK AVIVA PLUS) test strip Check blood sugar 4x daily. ICD10 E11.8  . insulin glargine (LANTUS) 100 UNIT/ML injection Inject 0.45 mLs (45 Units total) into the skin 2 (two) times daily.  . Insulin Syringe-Needle U-100 (INSULIN SYRINGE 1CC/31GX5/16") 31G X 5/16" 1 ML MISC Use to inject insulin 4x daily. ICD10 E11.8  . Lancets Misc. (ACCU-CHEK SOFTCLIX LANCET DEV) KIT Check blood sugar 4x daily. ICD10 E11.8  . lisinopril-hydrochlorothiazide (PRINZIDE,ZESTORETIC) 20-25 MG tablet Take 1 tablet by mouth daily.  . metFORMIN (GLUMETZA) 1000 MG (MOD) 24 hr tablet Take 1 tablet (1,000 mg total) by mouth daily with breakfast.  . metoprolol succinate (TOPROL-XL) 25 MG 24 hr tablet TAKE 1 TABLET BY MOUTH DAILY     Allergies:   Penicillins   Past Medical History:    Diagnosis Date  . Acute respiratory failure ([place])   . Diabetes mellitus ([place]) 06/04/2012  . DKA (diabetic ketoacidoses) ([place])   . HSV-2 infection   . HTN (hypertension) 06/04/2012  . Hyperlipidemia 06/04/2012    Past Surgical History:  Procedure Laterality Date  . CESAREAN SECTION N/A 08/08/2013   Procedure: CESAREAN SECTION;  Surgeon: Osborne Oman, MD;  Location: [facility];  Service: Obstetrics;  Laterality: N/A;  . INCISION AND DRAINAGE ABSCESS Bilateral 01/01/2016   Procedure: INCISION AND DRAINAGE OF BILATERAL GLUTEAL ABSCESSES TIMES THREE;  Surgeon: Greer Pickerel, MD;  Location: [facility];  Service: General;  Laterality: Bilateral;  . IRRIGATION AND DEBRIDEMENT ABSCESS  10/31/2012   Procedure: IRRIGATION AND DEBRIDEMENT ABSCESS;  Surgeon: Gwenyth Ober, MD;  Location: [facility];  Service: General;  Laterality: Bilateral;     Social History:  The patient  reports that  has never smoked. she has never used smokeless tobacco. She reports that she does not drink alcohol or use drugs.   Family History:  The patient's family history includes Cancer in her maternal grandfather; Diabetes in her father, paternal aunt, paternal grandfather, paternal grandmother, and paternal uncle; Heart disease in her father; Hypertension in her brother and mother; Stroke in her mother.  ROS:  Please see the history of present illness. All other systems are reviewed and  Negative to the above problem except as noted.    PHYSICAL EXAM: VS:  BP (!) 144/100   Pulse 76   Ht _0  (1.626 m)   Wt 242 lb 3.2 oz (109.9 kg)   BMI 41.57 kg/m   GEN: Well nourished, well developed, in no acute distress  HEENT: normal  Neck: no JVD, carotid bruits, or masses Cardiac: RRR; no murmurs, rubs, or gallops,no edema  Respiratory:  clear to auscultation bilaterally, normal work of breathing GI: soft, nontender, nondistended, + BS  No hepatomegaly  MS: no deformity Moving all extremities   Skin: warm and dry, no rash Neuro:   Strength and sensation are intact Psych: euthymic mood, full affect   EKG:  EKG is ordered today.  SR  75 bpm  LVH     Lipid Panel    Component Value Date/Time   CHOL 189 10/05/2016 1623   TRIG 103 10/05/2016 1623   HDL 61 10/05/2016 1623   CHOLHDL 3.1 10/05/2016 1623   VLDL 21 10/05/2016 1623   LDLCALC 107 10/05/2016 1623      Wt Readings from Last 3 Encounters:  11/16/17 242 lb 3.2 oz (109.9 kg)  09/15/17 252 lb (114.3 kg)  08/18/17 249 lb (112.9 kg)      ASSESSMENT AND PLAN:  1  Chest pain  Pt's symptoms are not completely typical  But with DM, HTN and FHx I would recomm Lexiscan myovue  She did have one about 2 year s ago that was normal Would continue activies as tolerated  Keep on same meds  2  HL   Needs lipids checked on lipitor    3  HTN  BP is not optimal   I would not make any changes now since it was good at other doctors visits.     Current medicines are reviewed at length with the patient today.  The patient does not have concerns regarding medicines.  Signed, Dorris Carnes, MD  11/16/2017 3:27 PM    [facility] [address], [place], [place]  [address] Phone: [phone]; Fax: [phone]

## 2017-11-16 NOTE — Patient Instructions (Signed)
Your physician recommends that you continue on your current medications as directed. Please refer to the Current Medication list given to you today.  Your physician has requested that you have a lexiscan myoview. For further information please visit [email or web address]. Please follow instruction sheet, as given.  Your physician recommends that you return for lab work today (LIPIDS)

## 2017-11-20 ENCOUNTER — Other Ambulatory Visit: Payer: Medicaid Other

## 2017-11-20 ENCOUNTER — Ambulatory Visit (HOSPITAL_COMMUNITY): Payer: Medicaid Other

## 2017-11-21 ENCOUNTER — Ambulatory Visit (HOSPITAL_COMMUNITY): Payer: Medicaid Other

## 2017-11-22 ENCOUNTER — Telehealth (HOSPITAL_COMMUNITY): Payer: Self-pay | Admitting: *Deleted

## 2017-11-22 NOTE — Telephone Encounter (Signed)
Left message on voicemail per DPR in reference to upcoming appointment scheduled on 11/27/17  with detailed instructions given per Myocardial Perfusion Study Information Sheet for the test. LM to arrive 15 minutes early, and that it is imperative to arrive on time for appointment to keep from having the test rescheduled. If you need to cancel or reschedule your appointment, please call the office within 24 hours of your appointment. Failure to do so may result in a cancellation of your appointment, and a $50 no show fee. Phone number given for call back for any questions. Zakye Baby Jacqueline    

## 2017-11-27 ENCOUNTER — Ambulatory Visit (HOSPITAL_COMMUNITY): Payer: Medicaid Other | Attending: Internal Medicine

## 2017-11-27 ENCOUNTER — Other Ambulatory Visit: Payer: Medicaid Other | Admitting: *Deleted

## 2017-11-27 DIAGNOSIS — R079 Chest pain, unspecified: Secondary | ICD-10-CM

## 2017-11-27 DIAGNOSIS — R0602 Shortness of breath: Secondary | ICD-10-CM | POA: Diagnosis not present

## 2017-11-27 DIAGNOSIS — E119 Type 2 diabetes mellitus without complications: Secondary | ICD-10-CM | POA: Diagnosis not present

## 2017-11-27 DIAGNOSIS — Z8249 Family history of ischemic heart disease and other diseases of the circulatory system: Secondary | ICD-10-CM | POA: Diagnosis not present

## 2017-11-27 DIAGNOSIS — I10 Essential (primary) hypertension: Secondary | ICD-10-CM | POA: Insufficient documentation

## 2017-11-27 MED ORDER — REGADENOSON 0.4 MG/5ML IV SOLN
0.4000 mg | Freq: Once | INTRAVENOUS | Status: AC
  Administered 2017-11-27: 0.4 mg via INTRAVENOUS

## 2017-11-27 MED ORDER — TECHNETIUM TC 99M TETROFOSMIN IV KIT
33.0000 | PACK | Freq: Once | INTRAVENOUS | Status: AC | PRN
  Administered 2017-11-27: 33 via INTRAVENOUS
  Filled 2017-11-27: qty 33

## 2017-11-28 LAB — LIPID PANEL
Chol/HDL Ratio: 2.8 ratio (ref 0.0–4.4)
Cholesterol, Total: 143 mg/dL (ref 100–199)
HDL: 51 mg/dL (ref >39)
LDL CALC: 77 mg/dL (ref 0–99)
Triglycerides: 76 mg/dL (ref 0–149)
VLDL CHOLESTEROL CAL: 15 mg/dL (ref 5–40)

## 2017-11-29 ENCOUNTER — Ambulatory Visit (HOSPITAL_COMMUNITY): Payer: Medicaid Other | Attending: Internal Medicine

## 2017-11-29 LAB — MYOCARDIAL PERFUSION IMAGING
CHL CUP RESTING HR STRESS: 80 {beats}/min
LHR: 0.2
LVDIAVOL: 119 mL (ref 46–106)
LVSYSVOL: 42 mL
Peak HR: 106 {beats}/min
SDS: 4
SRS: 2
SSS: 6
TID: 1.07

## 2017-11-29 MED ORDER — TECHNETIUM TC 99M TETROFOSMIN IV KIT
32.7000 | PACK | Freq: Once | INTRAVENOUS | Status: AC | PRN
  Administered 2017-11-29: 32.7 via INTRAVENOUS
  Filled 2017-11-29: qty 33

## 2017-12-01 ENCOUNTER — Other Ambulatory Visit: Payer: Self-pay | Admitting: *Deleted

## 2017-12-01 DIAGNOSIS — E785 Hyperlipidemia, unspecified: Secondary | ICD-10-CM

## 2017-12-01 MED ORDER — ATORVASTATIN CALCIUM 80 MG PO TABS: 80.0000 mg | ORAL_TABLET | Freq: Every day | ORAL | 3 refills | Status: DC

## 2017-12-18 ENCOUNTER — Other Ambulatory Visit: Payer: Self-pay | Admitting: Family Medicine

## 2017-12-18 NOTE — Telephone Encounter (Signed)
Can Rebecca Rhodes be scheduled for follow up for DM and HTN? Thank you.

## 2017-12-19 NOTE — Telephone Encounter (Signed)
appt made for 12/27/17. Jazmin Hartsell,CMA

## 2017-12-27 ENCOUNTER — Ambulatory Visit: Payer: Medicaid Other | Admitting: Family Medicine

## 2018-01-10 ENCOUNTER — Encounter: Payer: Self-pay | Admitting: Family Medicine

## 2018-01-10 ENCOUNTER — Other Ambulatory Visit: Payer: Self-pay

## 2018-01-10 ENCOUNTER — Ambulatory Visit: Payer: Medicaid Other | Admitting: Family Medicine

## 2018-01-10 VITALS — BP 124/86 | HR 70 | Temp 97.8°F | Ht 64.0 in | Wt 234.0 lb

## 2018-01-10 DIAGNOSIS — E118 Type 2 diabetes mellitus with unspecified complications: Secondary | ICD-10-CM

## 2018-01-10 DIAGNOSIS — E1165 Type 2 diabetes mellitus with hyperglycemia: Secondary | ICD-10-CM | POA: Diagnosis not present

## 2018-01-10 DIAGNOSIS — I10 Essential (primary) hypertension: Secondary | ICD-10-CM | POA: Diagnosis not present

## 2018-01-10 DIAGNOSIS — IMO0002 Reserved for concepts with insufficient information to code with codable children: Secondary | ICD-10-CM

## 2018-01-10 LAB — POCT GLYCOSYLATED HEMOGLOBIN (HGB A1C): Hemoglobin A1C: 15

## 2018-01-10 NOTE — Progress Notes (Signed)
    Subjective:    Patient ID: Rebecca Rhodes, female    DOB: 08/14/1981, 37 y.o.   MRN: [id number]   CC: DM follow up   DM- current medications: 60 units of Lantus- forgets to take insulin on weekends when going to help take care of her mom No novolog (this was discontinued- patient was self-dosing at her last appointment) Taking the amaryl and metformin- forget to take pills about once a week  When asked what we can do to get her sugars better under control patient states: "eat a whole lot better and stay away from the carbs". Rarely has sugar cravings. Has a lot of salt cravings. Drinks "a lot of sweet stuff". Drinks a lot of coke, reports drinking 1 twenty oz bottle a day. Willing to try coke zero. She is willing to meet with behavioral health team to better control diabetes. Willing to meet with pharmacist to discuss other medication options. She denies increased hunger, thirst, urination. Reports some blurred vision. Denies numbness/tingling in hands and feet.  Her blood pressure is well controlled today. She reports only missing BP meds maybe once a week. She denies CP, SOB.  Smoking status reviewed- non-smoker  Review of Systems   Objective:  BP 124/86   Pulse 70   Temp 97.8 F (36.6 C) (Oral)   Ht 5\' 4"  (1.626 m)   Wt 234 lb (106.1 kg)   LMP 12/13/2017 (Exact Date)   SpO2 99%   BMI 40.17 kg/m  Vitals and nursing note reviewed  General: well nourished, in no acute distress Cardiac: RRR, clear S1 and S2, no murmurs, rubs, or gallops Respiratory: clear to auscultation bilaterally, no increased work of breathing Extremities: no edema or cyanosis. Skin: warm and dry, no rashes noted Neuro: alert and oriented, no focal deficits   Assessment & Plan:    Diabetes mellitus type 2, uncontrolled, with complications (HCC)  Terribly uncontrolled, A1C today 15. There is a component of noncompliance with missing medications and poor diet. Patient acknowledges these issues and  comes up with solutions to them however this is the third or fourth time I am discussing these issues with her. She recognizes the risks of having DM including heart attack and stroke. -scheduled to follow up in pharmacy clinic next week with Dr. Raymondo BandKoval -[facility] -repeat A1C 3 months -DM diet reviewed with patient, handout given  HTN (hypertension)  At goal today, continue current regimen    Return in about 4 weeks (around 02/07/2018) for DM.   Dolores PattyAngela Jorma Tassinari, DO Family Medicine Resident PGY-2

## 2018-01-10 NOTE — Patient Instructions (Signed)
  We will see you next Thursday for your appointment with Dr. Raymondo BandKoval.  Rebecca PattyAngela Wauneta Silveria, DO PGY-2, [facility] 01/10/2018 3:35 PM    Diet Recommendations for Diabetes   Starchy (carb) foods: Bread, rice, pasta, potatoes, corn, cereal, grits, crackers, bagels, muffins, all baked goods.  (Fruits, milk, and yogurt also have carbohydrate, but most of these foods will not spike your blood sugar as the starchy foods will.)  A few fruits do cause high blood sugars; use small portions of bananas (limit to 1/2 at a time), grapes, watermelon, oranges, and most tropical fruits.    Protein foods: Meat, fish, poultry, eggs, dairy foods, and beans such as pinto and kidney beans (beans also provide carbohydrate).   1. Eat at least 3 meals and 1-2 snacks per day. Never go more than 4-5 hours while awake without eating. Eat breakfast within the first hour of getting up.   2. Limit starchy foods to TWO per meal and ONE per snack. ONE portion of a starchy  food is equal to the following:   - ONE slice of bread (or its equivalent, such as half of a hamburger bun).   - 1/2 cup of a "scoopable" starchy food such as potatoes or rice.   - 15 grams of carbohydrate as shown on food label.  3. Include at every meal: a protein food, a carb food, and vegetables and/or fruit.   - Obtain twice the volume of veg's as protein or carbohydrate foods for both lunch and dinner.   - Fresh or frozen veg's are best.   - Keep frozen veg's on hand for a quick vegetable serving.

## 2018-01-10 NOTE — Assessment & Plan Note (Signed)
  Terribly uncontrolled, A1C today 15. There is a component of noncompliance with missing medications and poor diet. Patient acknowledges these issues and comes up with solutions to them however this is the third or fourth time I am discussing these issues with her. She recognizes the risks of having DM including heart attack and stroke. -scheduled to follow up in pharmacy clinic next week with Dr. Raymondo BandKoval -[facility] -repeat A1C 3 months -DM diet reviewed with patient, handout given

## 2018-01-10 NOTE — Assessment & Plan Note (Signed)
At goal today, continue current regimen

## 2018-01-18 ENCOUNTER — Ambulatory Visit: Payer: Medicaid Other | Admitting: Pharmacist

## 2018-01-18 ENCOUNTER — Encounter: Payer: Self-pay | Admitting: Pharmacist

## 2018-01-18 DIAGNOSIS — IMO0002 Reserved for concepts with insufficient information to code with codable children: Secondary | ICD-10-CM

## 2018-01-18 DIAGNOSIS — E118 Type 2 diabetes mellitus with unspecified complications: Secondary | ICD-10-CM

## 2018-01-18 DIAGNOSIS — E1165 Type 2 diabetes mellitus with hyperglycemia: Secondary | ICD-10-CM | POA: Diagnosis not present

## 2018-01-18 MED ORDER — METFORMIN HCL ER (MOD) 1000 MG PO TB24: 1000.0000 mg | ORAL_TABLET | Freq: Two times a day (BID) | ORAL | Status: DC

## 2018-01-18 MED ORDER — INSULIN PEN NEEDLE 31G X 8 MM MISC: 1.0000 | Freq: Once | 11 refills | Status: AC

## 2018-01-18 MED ORDER — LIRAGLUTIDE 18 MG/3ML ~~LOC~~ SOPN: 0.6000 mg | PEN_INJECTOR | Freq: Every day | SUBCUTANEOUS | 5 refills | Status: DC

## 2018-01-18 MED ORDER — INSULIN ASPART 100 UNIT/ML ~~LOC~~ SOLN: 15.0000 [IU] | Freq: Two times a day (BID) | SUBCUTANEOUS | Status: DC

## 2018-01-18 MED ORDER — INSULIN GLARGINE 100 UNIT/ML ~~LOC~~ SOLN: 35.0000 [IU] | Freq: Two times a day (BID) | SUBCUTANEOUS | Status: DC

## 2018-01-18 NOTE — Assessment & Plan Note (Signed)
Uncontrolled diabetes longstanding, last A1c 15.0 (1/30). Patient denies hypoglycemic events and is able to verbalize appropriate hypoglycemia management plan. Patient denies adherence with medication. Control is suboptimal due to motivational and behavioral factors.Will adjust basal insulin Lantus (insulin glargine) to 35 BID. Will also restart rapid insulin Novolog (insulin aspart) at 15-20 Units to be administered with the two largest meals.Added Victoza (liraglutide) to 1.8 mg daily. Patient is to start with 0.6 mg dose for the first week, titrate to 1.2 mg the second week, then increase dose to 1.8 mg daily as tolerated. Patient counseled about side effects of nausea and can titrate dose down accordingly until tolerated. Patient has been encouraged to monitor blood glucose throughout the week. Return to clinic in a month. If CBGs remain high in one week, please contact Rx clinic for Lantus titration.

## 2018-01-18 NOTE — Progress Notes (Signed)
    S:     Chief Complaint  Patient presents with  . Medication Management    diabetes    Patient arrives in good spirits. Patient has longstanding Type 2 DM and presents for diabetes evaluation, education, and management. Patient was referred on 1/30 by Dr. Wonda Oldsiccio.  Patient was last seen by Primary Care Provider on 1/30.   Patient admits to medication non-adherence. She will often forget to bring her insulin with her to her mother's house when she visits her over the weekend. She no longer takes aspirin 81 mg due to worry of confusing that medication with others. She reports checking her CBGs once weekly.   Patient endorses dietary indiscretions, including drinking a lot of fruit juice, although she reports she has moved away from drinking soda. She drinks 4-5 glasses of fruit juice throughout the day, and one 20-ounce bottle of soda per day if she has it on hand. She generally eats 2 big carb-heavy meals a day with minimal snacks (e.g. chips, ramen noodles).   Patient sleeps 6 hours per night, waking up during the night if her daughter wakes up. She denies nocturia and only urinates once during the night if needed.   Discussed adding Victoza and changing insulin and oral antidiabetic medications today. Patient is willing and expresses interest in better glucose management and a 50 lb weight loss.   Current diabetes medications include: Lantus 60 Units daily (our records indicate 45 units BID), Amaryl 4 mg daily, Metformin 1,000 mg daily. Patient was taking Novolog with meals, although it was not indicated in the medication list.   Current hypertension medications include: Lisinopril-HCTZ 20-25 mg daily, metoprolol succinate 24-hour once daily  Patient denies hypoglycemic events.   Patient denies nocturia.  Patient denies neuropathy. Patient denies visual changes. Patient denies self foot exams.    O:  Physical Exam  Well-appearing.    Review of Systems  All other systems  reviewed and are negative.     Lab Results  Component Value Date   HGBA1C 15.0 01/10/2018   Vitals:   01/18/18 1514  BP: 132/88  Pulse: 82  SpO2: 98%    Home fasting CBG: 200s (only checks once per week)   A/P: Uncontrolled diabetes longstanding, last A1c 15.0 (1/30). Patient denies hypoglycemic events and is able to verbalize appropriate hypoglycemia management plan. Patient denies adherence with medication. Control is suboptimal due to motivational and behavioral factors.Will adjust basal insulin Lantus (insulin glargine) to 35 BID. Will also restart rapid insulin Novolog (insulin aspart) at 15-20 Units to be administered with the two largest meals.Added Victoza (liraglutide) to 1.8 mg daily. Patient is to start with 0.6 mg dose for the first week, titrate to 1.2 mg the second week, then increase dose to 1.8 mg daily as tolerated. Patient counseled about side effects of nausea and can titrate dose down accordingly until tolerated. Patient has been encouraged to monitor blood glucose throughout the week. Return to clinic in a month. If CBGs remain high in one week, please contact Rx clinic for Lantus titration.  Hypertension longstanding and is slightly above the goal of <130/80 mmgH. She reports adherence. No changes to medication regimen at this time at this time.   Written patient instructions provided.  Total time in face to face counseling 25 minutes.   Follow up in Pharmacist Clinic Visit in 4 weeks.   Patient seen with Remus LofflerGloria Adedoyin, PharmD Candidate and Adline PotterSabrina Dunham, PharmD.

## 2018-01-18 NOTE — Patient Instructions (Addendum)
1. Today we will be changing your Lantus dose to 35 units two times daily.   2. Inject Novolog 15-20 Units with the two largest meals.   3. Start Victoza pen daily. For first week, start using 0.6 mg daily. On week 2, increase dose to 1.2 mg. On week three, increase dose to 1.8 mg daily. If you feel nauseous, dial back the dose 2-3 clicks.   4. Stop taking Amaryl today.   5. Increase metformin to 1,000 mg two times daily with food.    6. Follow-up in pharmacy clinic in 4 weeks.

## 2018-01-19 ENCOUNTER — Other Ambulatory Visit: Payer: Self-pay | Admitting: Family Medicine

## 2018-01-22 NOTE — Progress Notes (Signed)
Patient ID: Rebecca Rhodes, female   DOB: 07/29/1981, 37 y.o.   MRN: [id number] Reviewed: Agree with Dr. Macky LowerKoval's documentation and management.

## 2018-01-23 ENCOUNTER — Telehealth: Payer: Self-pay

## 2018-01-23 NOTE — Telephone Encounter (Signed)
Diannia RuderKara, pharmacist at [facility] left message on nurse line requesting clarification on dosing instructions on Victoza, specifically the intervals of the dosage changes. Please call her at [phone]. Ples SpecterAlisa Brake, RN [facility] RN)

## 2018-01-24 NOTE — Telephone Encounter (Signed)
Clarified Victoza dosing.

## 2018-01-26 ENCOUNTER — Other Ambulatory Visit: Payer: Medicaid Other

## 2018-01-29 ENCOUNTER — Other Ambulatory Visit: Payer: Self-pay | Admitting: Family Medicine

## 2018-01-29 NOTE — Telephone Encounter (Signed)
I believe this medication was stopped at her last visit w/ Dr. Raymondo BandKoval- will ask him for clarification.

## 2018-02-15 ENCOUNTER — Ambulatory Visit (INDEPENDENT_AMBULATORY_CARE_PROVIDER_SITE_OTHER): Payer: Self-pay | Admitting: Pharmacist

## 2018-02-15 ENCOUNTER — Encounter: Payer: Self-pay | Admitting: Pharmacist

## 2018-02-15 DIAGNOSIS — IMO0002 Reserved for concepts with insufficient information to code with codable children: Secondary | ICD-10-CM

## 2018-02-15 DIAGNOSIS — E1165 Type 2 diabetes mellitus with hyperglycemia: Secondary | ICD-10-CM

## 2018-02-15 DIAGNOSIS — I10 Essential (primary) hypertension: Secondary | ICD-10-CM

## 2018-02-15 DIAGNOSIS — E118 Type 2 diabetes mellitus with unspecified complications: Secondary | ICD-10-CM

## 2018-02-15 MED ORDER — METFORMIN HCL ER (MOD) 1000 MG PO TB24: 2000.0000 mg | ORAL_TABLET | Freq: Every day | ORAL | Status: DC

## 2018-02-15 MED ORDER — INSULIN ASPART 100 UNIT/ML ~~LOC~~ SOLN: 25.0000 [IU] | Freq: Two times a day (BID) | SUBCUTANEOUS | Status: DC

## 2018-02-15 MED ORDER — ATORVASTATIN CALCIUM 80 MG PO TABS: 80.0000 mg | ORAL_TABLET | Freq: Every day | ORAL | 3 refills | Status: DC

## 2018-02-15 MED ORDER — INSULIN GLARGINE 100 UNIT/ML ~~LOC~~ SOLN: 50.0000 [IU] | Freq: Every day | SUBCUTANEOUS | Status: DC

## 2018-02-15 NOTE — Assessment & Plan Note (Signed)
Diabetes longstanding/newly diagnosed currently uncontrolled. Patient denies hypoglycemic events and is able to verbalize appropriate hypoglycemia management plan. Patient denies adherence with medication. Control is suboptimal due to medication non-adherence, dietary indiscretion, and sedentary lifestyle. Increased dose of basal insulin Lantus (insulin glargine). From 35u twice a day to 50u once a day (in the AM) to simplify med regimen and increase adherence. Increased dose of rapid insulin Novolog (insulin aspart) to 25u twice a day before meals. Emphasized the importance to remember to take PM dose of Novolog.  Increased dose of metformin XR to 1500 mg once a day x1wk then increase to 2000 mg once a day in the AM.  Decreased dose of Victoza (liraglutide) to 1.2 mg due to nausea and loss of appetite with current dose (1.8 mg).  Next A1C anticipated 04/10/2018.

## 2018-02-15 NOTE — Progress Notes (Signed)
    S:     Chief Complaint  Patient presents with  . Medication Management    Diabetes    Patient arrives ambulating w/o assistance.  Presents for f/u on diabetes evaluation, education, and management at the request of Dr. Wonda Oldsiccio. Patient was referred on 01/10/18.  Patient was last seen by Primary Care Provider on 01/10/18 and last seen by me on 01/18/18.   Patient reports non-adherence with medications. Does not take PM doses of metformin, Novolog, and Lantus; but does take the AM doses. Current diabetes medications include: Lantus 35u [place] BID, Novolog 20u [place] BID AC, metformin 1g PO BID Current hypertension medications include: lisinopril-HCTZ 20-25 mg tab PO daily, metoprolol succinate 25 mg tab PO daily.   Patient denies hypoglycemic events but blood sugar log reveals one instance of a mild (level 1) hypoglycemia event (70 mg/dL) on 1/612/12 at 9 AM. Pt believes this hypoglycemic episode is isolated and likely due to her having skipped a meal that day.   Patient reported dietary habits: Eats 2 meals/day Breakfast: skips Lunch: Burger & fries (largest meal) Dinner: unknown but smaller than lunch Snacks: n./a Drinks: n/a  Patient reported exercise habits: plays outside with daughter 2 hrs/wk.   O:  Physical Exam  Constitutional: She appears well-developed and well-nourished.    Review of Systems  Gastrointestinal: Positive for nausea.  All other systems reviewed and are negative.   Lab Results  Component Value Date   HGBA1C 15.0 01/10/2018   Vitals:   02/15/18 1449  BP: (!) 152/88  Pulse: 83  SpO2: 98%    Home fasting CBG: 220 mg/dL  2 hour post-prandial/random CBG: 400 mg/dL.  10 year ASCVD risk: 7.7%. (assumed age of 37 using ACC online calculator)  A/P: Diabetes longstanding/newly diagnosed currently uncontrolled. Patient denies hypoglycemic events and is able to verbalize appropriate hypoglycemia management plan. Patient denies adherence with medication. Control is  suboptimal due to medication non-adherence, dietary indiscretion, and sedentary lifestyle. Increased dose of basal insulin Lantus (insulin glargine). From 35u twice a day to 50u once a day (in the AM) to simplify med regimen and increase adherence. Increased dose of rapid insulin Novolog (insulin aspart) to 25u twice a day before meals. Emphasized the importance to remember to take PM dose of Novolog.  Increased dose of metformin XR to 1500 mg once a day x1wk then increase to 2000 mg once a day in the AM.  Decreased dose of Victoza (liraglutide) to 1.2 mg due to nausea and loss of appetite with current dose (1.8 mg).  Next A1C anticipated 04/10/2018.    ASCVD risk greater than 7.5%. Continued Aspirin 81 mg and Continued atorvastatin 80 mg.   Hypertension longstanding, currently uncontrolled; treated with lisinopril/HCTZ 20-25 mg daily and metoprolol succinate 25 mg daily.  Patient denies adherence with medication. Control is suboptimal due to lack of adherence. Patient counseled on medication adherence. No changes to medications at this time.   Written patient instructions provided. Total time in face to face counseling 30 minutes.   Follow up in Pharmacist Clinic Visit in 2-3 weeks. Patient seen with Rodolph Bonghris Wang, PharmD Candidate

## 2018-02-15 NOTE — Assessment & Plan Note (Signed)
Hypertension longstanding, currently uncontrolled; treated with lisinopril/HCTZ 20-25 mg daily and metoprolol succinate 25 mg daily.  Patient denies adherence with medication. Control is suboptimal due to lack of adherence. Patient counseled on medication adherence. No changes to medications at this time.

## 2018-02-15 NOTE — Patient Instructions (Signed)
Great to see you today. Please schedule a visit with Annice P[facility]Jackie here at [facility] to discuss financial eligibility for the "[employer]".  I anticipate you will go to the [facility] and obtain new prescriptions for your medications from the manufacturers.   Please increase your Metformin XR to 1 and a half tablets in the AM for 1 week then increase to two pills in the morning daily.   Lantus dose is now 50 units once daily in the morning.   Novolog dose is now 25 units prior to the two largest meals of the day.   Continue Victoza same dose at this time.    Follow-up in 2-3 weeks Rx Clinic.

## 2018-02-16 NOTE — Progress Notes (Signed)
Patient ID: Rebecca Rhodes, female   DOB: 01/06/1981, 37 y.o.   MRN: [id number] Reviewed: Agree with Dr. Macky LowerKoval's documentation and management.

## 2018-02-18 ENCOUNTER — Other Ambulatory Visit: Payer: Self-pay

## 2018-03-08 ENCOUNTER — Ambulatory Visit: Payer: Self-pay | Admitting: Pharmacist

## 2018-05-10 ENCOUNTER — Encounter: Payer: Self-pay | Admitting: Pharmacist

## 2018-05-10 ENCOUNTER — Ambulatory Visit (INDEPENDENT_AMBULATORY_CARE_PROVIDER_SITE_OTHER): Payer: Self-pay | Admitting: Pharmacist

## 2018-05-10 DIAGNOSIS — E1165 Type 2 diabetes mellitus with hyperglycemia: Secondary | ICD-10-CM

## 2018-05-10 DIAGNOSIS — I10 Essential (primary) hypertension: Secondary | ICD-10-CM

## 2018-05-10 DIAGNOSIS — E782 Mixed hyperlipidemia: Secondary | ICD-10-CM

## 2018-05-10 DIAGNOSIS — E118 Type 2 diabetes mellitus with unspecified complications: Secondary | ICD-10-CM

## 2018-05-10 DIAGNOSIS — IMO0002 Reserved for concepts with insufficient information to code with codable children: Secondary | ICD-10-CM

## 2018-05-10 MED ORDER — METFORMIN HCL ER (MOD) 1000 MG PO TB24: 2000.0000 mg | ORAL_TABLET | Freq: Every day | ORAL | 3 refills | Status: DC

## 2018-05-10 MED ORDER — LIRAGLUTIDE 18 MG/3ML ~~LOC~~ SOPN: 0.6000 mg | PEN_INJECTOR | Freq: Every day | SUBCUTANEOUS | 0 refills | Status: DC

## 2018-05-10 MED ORDER — INSULIN ASPART 100 UNIT/ML ~~LOC~~ SOLN: 20.0000 [IU] | Freq: Two times a day (BID) | SUBCUTANEOUS | 0 refills | Status: DC

## 2018-05-10 MED ORDER — INSULIN DEGLUDEC 200 UNIT/ML ~~LOC~~ SOPN: 60.0000 [IU] | PEN_INJECTOR | Freq: Every day | SUBCUTANEOUS | 0 refills | Status: DC

## 2018-05-10 MED ORDER — ATORVASTATIN CALCIUM 80 MG PO TABS: 80.0000 mg | ORAL_TABLET | Freq: Every day | ORAL | 3 refills | Status: DC

## 2018-05-10 MED ORDER — LISINOPRIL-HYDROCHLOROTHIAZIDE 20-25 MG PO TABS: 1.0000 | ORAL_TABLET | Freq: Every day | ORAL | 3 refills | Status: DC

## 2018-05-10 NOTE — Assessment & Plan Note (Signed)
Diabetes longstanding currently uncontrolled as evidenced by A1C and CBG readings. Patient is able to verbalize appropriate hypoglycemia management plan. Patient is not adherent with medication. Control is suboptimal due to medication nonadherence 2/2 affordability. -Gave samples of basal insulin Tresiba (insulin degludec) and rapid insulin Novolog (insulin aspart). Convert Lantus to Tresiba 1:1 at 60 units daily. Change novolog to 20 units with meals.  -Gave samples of GLP-1 Victoza (generic name liraglutide) and restarted titration: 0.6 mg x1 week, 1.2 mg x1 week, 1.8 mg thereafter. -Restarted metformin XR 2000 mg daily   -Extensively discussed pathophysiology of DM, recommended lifestyle interventions, dietary effects on glycemic control -Counseled on s/sx of and management of hypoglycemia -Next A1C anticipated in 3 months after being back on therapy.

## 2018-05-10 NOTE — Assessment & Plan Note (Signed)
Hypertension longstanding currently uncontrolled.  BP goal = <130/80 mmHg. Patient is not adherent with medication. Control is suboptimal due to medication nonadherence 2/2 affordability. -Restarted lisinopril-HCTZ 20-25 mg daily. Counseled on importance of contraceptive methods. -Restarted metoprolol succinate 25 mg daily.

## 2018-05-10 NOTE — Assessment & Plan Note (Signed)
ASCVD risk - primary prevention in patient with DM. Last LDL is not controlled. ASCVD risk score is >20%  - high intensity statin indicated. Aspirin is indicated.  -Restarted aspirin 81 mg. -Restarted atorvastatin 80 mg. Counseled on importance of contraceptive methods.

## 2018-05-10 NOTE — Patient Instructions (Addendum)
Thanks for coming to see [place].   1. Continue lantus (long acting) 60 units once a day. When you run out of lantus, switch to [place] (long acting) 60 units once a day  2. Change Novolog (FOOD) - 20 units with meals  3. Restart Victoza:  Week 1 - 0.6 mg under the skin once daily Week 2 - 1.2 mg under the skin once daily Week 3 and after - 1.8 mg under the skin once daily  4. Restart your blood pressure medicines and cholesterol medicines and baby aspirin: - lisinopril - HCTZ 20-25 mg by mouth every morning - metoprolol succinate 25 mg by mouth every day - atorvastatin 80 mg by mouth once daily - aspirin 81 mg by mouth once daily You can pick these up at [facility] for $4/month or $9/7-month supply  Come back to see [place] in 2 weeks so we can check on your kidneys, electrolytes, and blood sugars.

## 2018-05-10 NOTE — Progress Notes (Signed)
S:    Chief Complaint  Patient presents with  . Medication Management    Diabetes   Patient arrives in good spirits and ambulating without assistance. Presents for diabetes evaluation, education, and management as a follow up to a pharmacy clinic visit on 02/15/18. Patient was referred on 01/10/18 by Dr. Dolores Patty. Patient was last seen by Primary Care Provider on 01/10/18.   Insurance coverage/medication affordability: lost insurance (Medicaid) about a month ago and has not been on any medications except insulin of which she is on her last month supply. Reports $4 list at [facility] would be affordable. Plans to go to [facility] office this week to reapply.   Patient denies adherence with medications. Current diabetes medications include: Lantus 60 units daily, Novolog 20 units first thing in the morning (occasionally will get confused and take 60 units). Prescribed but not taking: Victoza 1.8 mg daily and metformin 2,000 mg daily.  Patient reports hypoglycemic events when she accidentally injects 60 units of Novolog. Patient reports nocturia several times per night. 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 Patient denies visual changes.  Patient reported dietary habits: Eats 2 meals/day Breakfast: none Lunch: [employer] chicken sandwich + fries Dinner: Ramen noodles Snacks: potato chips Drinks: sweet tea, berry flavored juice, water  Patient-reported exercise habits: minimal; walking from bus stop to home (5-10 min).  Patient reports she is not trying to get pregnant but is sexually active, not actively using any contraceptive methods. Reports she doesn't think she's pregnant at this time.   O:  Physical Exam  Constitutional: She appears well-developed and well-nourished.  Vitals reviewed.  Review of Systems  All other systems reviewed and are negative.  Lab Results  Component Value Date   HGBA1C 15.0 01/10/2018   Vitals:   05/10/18 1356  BP: (!) 142/98    Pulse: 97  SpO2: 98%    Lipid Panel     Component Value Date/Time   CHOL 143 11/27/2017 1311   TRIG 76 11/27/2017 1311   HDL 51 11/27/2017 1311   CHOLHDL 2.8 11/27/2017 1311   CHOLHDL 3.1 10/05/2016 1623   VLDL 21 10/05/2016 1623   LDLCALC 77 11/27/2017 1311   2 hour post-prandial/random CBG: 300s-"high", excursion to 140s and 79.  Clinical ASCVD: No  10 year ASCVD risk: 39%  A/P: Diabetes longstanding currently uncontrolled as evidenced by A1C and CBG readings. Patient is able to verbalize appropriate hypoglycemia management plan. Patient is not adherent with medication. Control is suboptimal due to medication nonadherence 2/2 affordability. -Gave samples of basal insulin Tresiba (insulin degludec) and rapid insulin Novolog (insulin aspart). Convert Lantus to Tresiba 1:1 at 60 units daily. Change novolog to 20 units with meals.  -Gave samples of GLP-1 Victoza (generic name liraglutide) and restarted titration: 0.6 mg x1 week, 1.2 mg x1 week, 1.8 mg thereafter. -Restarted metformin XR 2000 mg daily   -Extensively discussed pathophysiology of DM, recommended lifestyle interventions, dietary effects on glycemic control -Counseled on s/sx of and management of hypoglycemia -Next A1C anticipated in 3 months after being back on therapy.   ASCVD risk - primary prevention in patient with DM. Last LDL is not controlled. ASCVD risk score is >20%  - high intensity statin indicated. Aspirin is indicated.  -Restarted aspirin 81 mg. -Restarted atorvastatin 80 mg. Counseled on importance of contraceptive methods.   Hypertension longstanding currently uncontrolled.  BP goal = <130/80 mmHg. Patient is not adherent with medication. Control is suboptimal due to medication nonadherence 2/2 affordability. -  Restarted lisinopril-HCTZ 20-25 mg daily. Counseled on importance of contraceptive methods. -Restarted metoprolol succinate 25 mg daily.  Written patient instructions provided.  Total time in  face to face counseling 60 minutes.   Follow up Pharmacist Clinic Visit in 2 weeks.   Patient seen with Tama Headings, PharmD Candidate, Ladell Pier, PharmD, PGY1 Pharmacy Resident and Devota Pace, PharmD, BCPS, PGY2 Pharmacy Resident.

## 2018-05-15 NOTE — Progress Notes (Signed)
Patient ID: Rebecca Rhodes, female   DOB: 06/22/1981, 37 y.o.   MRN: [id number] Reviewed: Agree with Dr. Macky LowerKoval's documentation and management.

## 2018-05-24 ENCOUNTER — Ambulatory Visit: Payer: Medicaid Other | Admitting: Pharmacist

## 2018-05-31 ENCOUNTER — Ambulatory Visit: Payer: Self-pay | Admitting: Pharmacist

## 2018-06-07 ENCOUNTER — Encounter: Payer: Self-pay | Admitting: Pharmacist

## 2018-06-07 ENCOUNTER — Ambulatory Visit (INDEPENDENT_AMBULATORY_CARE_PROVIDER_SITE_OTHER): Payer: Self-pay | Admitting: Pharmacist

## 2018-06-07 VITALS — BP 138/102 | HR 120 | Ht 64.0 in | Wt 209.0 lb

## 2018-06-07 DIAGNOSIS — E118 Type 2 diabetes mellitus with unspecified complications: Secondary | ICD-10-CM

## 2018-06-07 DIAGNOSIS — E1165 Type 2 diabetes mellitus with hyperglycemia: Secondary | ICD-10-CM

## 2018-06-07 DIAGNOSIS — IMO0002 Reserved for concepts with insufficient information to code with codable children: Secondary | ICD-10-CM

## 2018-06-07 LAB — GLUCOSE, POCT (MANUAL RESULT ENTRY): POC GLUCOSE: 446 mg/dL — AB (ref 70–99)

## 2018-06-07 MED ORDER — LIRAGLUTIDE 18 MG/3ML ~~LOC~~ SOPN: 1.2000 mg | PEN_INJECTOR | Freq: Every day | SUBCUTANEOUS | 4 refills | Status: DC

## 2018-06-07 MED ORDER — INSULIN DEGLUDEC 200 UNIT/ML ~~LOC~~ SOPN: 70.0000 [IU] | PEN_INJECTOR | Freq: Every day | SUBCUTANEOUS | 4 refills | Status: DC

## 2018-06-07 MED ORDER — FLUCONAZOLE 150 MG PO TABS: 150.0000 mg | ORAL_TABLET | Freq: Every day | ORAL | 0 refills | Status: DC

## 2018-06-07 MED ORDER — INSULIN ASPART 100 UNIT/ML ~~LOC~~ SOLN: 30.0000 [IU] | Freq: Two times a day (BID) | SUBCUTANEOUS | 4 refills | Status: DC

## 2018-06-07 NOTE — Progress Notes (Signed)
S:     Chief Complaint  Patient presents with  . Medication Management    Diabetes    Patient arrives in fair spirits, ambulating without difficulty, somewhat flat affect.  Presents for diabetes evaluation, education, and management at the request of Dr. Lucila Maine. Patient was referred on 01/10/2018. Last seen in pharmacy clinic on 05/10/2018 - at that time medications were restarted and samples were provided.   Today, patient reports she is feeling "miserable, tired" and endorses yeast infection sx x3 months. She denies trying anything for the yeast infection yet. States that she ran out of Victoza yesterday but has both of her insulins. Reports sx of nausea that have reduced her appetite considerably. Did increase Victoza to the 1.8 mg dose. Recently took pregnancy test x2 and they resulted as negative.  Insurance coverage/medication affordability:  Reports she unfortunately never went to [place] to pick up lisinopril-HCTZ, aspirin, atorvastatin, or metformin as she does not have the money at present. She thinks her boyfriend will help her buy these medicines after the first of the month. Has not been unable to obtain Medicaid drug coverage but does have Medicaid family planning. Has not inquired about orange card yet.   Patient denies adherence with medications.  Current diabetes medications include: Victoza 1.8 mg daily, Tresiba 60 units daily, Novolog 20 units with meals Current hypertension medications include: none - has not picked up lisinopril-HCTZ 20-25 mg yet  Patient denies hypoglycemic events.  Patient reported dietary habits: Eats 1-2 meals/day Breakfast:rare Lunch:can of spaghetti and orange juice Dinner:pork chops and mac+cheese, water+lime Snacks:none  Patient-reported exercise habits: none at present   Patient reports nocturia. 2-3 times nightly. Patient reports polydipsia. Patient reports neuropathy. R leg, intermittent. Patient reports visual  changes. Patient denies self foot exams. Denies issues at present.  O:  Physical Exam  Constitutional: She appears well-developed and well-nourished.     Review of Systems  All other systems reviewed and are negative.    Lab Results  Component Value Date   HGBA1C 15.0 01/10/2018   Vitals:   06/07/18 1555  BP: (!) 138/102  Pulse: (!) 120  SpO2: 97%    Lipid Panel     Component Value Date/Time   CHOL 143 11/27/2017 1311   TRIG 76 11/27/2017 1311   HDL 51 11/27/2017 1311   CHOLHDL 2.8 11/27/2017 1311   CHOLHDL 3.1 10/05/2016 1623   VLDL 21 10/05/2016 1623   LDLCALC 77 11/27/2017 1311    Home CBG: has not been checking Tested in clinic today with POCT CBG = 446 mg/dL  2 hour post-prandial/random CBG: 300s-"high", excursion to 140s and 79.  Clinical ASCVD: No  10 year ASCVD risk: 39%  A/P: Diabetes longstanding currently uncontrolled as evidenced by A1C and CBG reading in clinic + report of nocturia. Patient is able to verbalize appropriate hypoglycemia management plan. Patient is not adherent with medication. Control is suboptimal due to medication nonadherence 2/2 affordability and active infection (yeast infection). -Gave samples of basal insulin Tresiba (insulin degludec) and increased to 70 units daily. Increased rapid insulin Novolog (insulin aspart) to 30 units with meals -Gave samples of GLP-1 Victoza (generic name liraglutide) and restarted at 1.2 mg daily with c/o nausea.  -Restarted metformin XR 2000 mg daily   -Extensively discussed pathophysiology of DM, recommended lifestyle interventions, dietary effects on glycemic control -Counseled on s/sx of and management of hypoglycemia -Next A1C anticipated in 3 months after being back on therapy.  Medication Samples have been  provided to the patient. Drug name: Tyler Aas      Strength: u100/mL        Qty: 4 pens  LOT: [id number]  Exp.Date: 01/12/2020 Dosing instructions: Inject 70 units subcut once daily Drug  name: Victoza     Strength: 59m/3mL        Qty: 2 pens  LOT: [id number]  Exp.Date: 04/11/2019  Dosing instructions: Inject 1.2 mg subcut once daily   ASCVD risk - primary prevention in patient with DM. Last LDL is not controlled. ASCVD risk score is >20%  - high intensity statin indicated. Aspirin is indicated. Patient has not picked up medications yet, limited by affordability. -Restarted aspirin 81 mg. -Restarted atorvastatin 80 mg. Counseled on importance of contraceptive methods.   Hypertension longstanding currently uncontrolled.  BP goal = <130/80 mmHg. Patient is not adherent with medication. Control is suboptimal due to medication nonadherence 2/2 affordability.  -Restarted lisinopril-HCTZ 20-25 mg daily. Counseled on importance of contraceptive methods. -Restarted metoprolol succinate 25 mg daily.  Complaints of yeast infection x3 months. Discussed with Dr. HAndria Frames  -Started Diflucan 150 mg by mouth daily x3 days.   Medication access issues as a significant barrier to therapeutic progress - provided instructions on setting up [facility] in office. Will pursue medication assistance through [facility] [facility]. Sent prescriptions for Victoza, Tresiba, and Novolog there.   Written patient instructions provided.  Total time in face to face counseling 60 minutes.   Follow up Pharmacist Clinic Visit in 2-3 weeks.   Patient seen with CDeirdre Pippins PharmD, BCPS, PGY2 Pharmacy Resident.

## 2018-06-07 NOTE — Assessment & Plan Note (Signed)
Diabetes longstanding currently uncontrolled as evidenced by A1C and CBG reading in clinic + report of nocturia. Patient is able to verbalize appropriate hypoglycemia management plan. Patient is not adherent with medication. Control is suboptimal due to medication nonadherence 2/2 affordability and active infection (yeast infection). -Gave samples of basal insulin Tresiba (insulin degludec) and increased to 70 units daily. Increased rapid insulin Novolog (insulin aspart) to 30 units with meals -Gave samples of GLP-1 Victoza (generic name liraglutide) and restarted at 1.2 mg daily with c/o nausea.  -Restarted metformin XR 2000 mg daily   -Extensively discussed pathophysiology of DM, recommended lifestyle interventions, dietary effects on glycemic control -Counseled on s/sx of and management of hypoglycemia -Next A1C anticipated in 3 months after being back on therapy.  Medication Samples have been provided to the patient. Drug name: Evaristo Buryresiba      Strength: u100/mL        Qty: 4 pens  LOT: [id number]  Exp.Date: 01/12/2020 Dosing instructions: Inject 70 units subcut once daily Drug name: Victoza     Strength: 18mg /783mL        Qty: 2 pens  LOT: [id number]  Exp.Date: 04/11/2019  Dosing instructions: Inject 1.2 mg subcut once daily

## 2018-06-07 NOTE — Patient Instructions (Addendum)
Thanks for coming to see Rebecca Rhodes.   1. Start Fluconazole 150 mg by mouth once a day x 3 days for the yeast infection  2. Increase Tresiba to 70 units once a day  3. Increase Novolog to 30 units with meals  4. Restart Victoza at 1.2 mg once a day  5. Restart metformin, lisinopril-HCTZ, aspirin, and atorvastatin. [facility] [address] [place].   Make and appointment to see Annice PihJackie here at the [facility] to get you signed up for the [facility]. Then [facility] up for the [facility] program. I send prescriptions for the insulins and the Victoza here.

## 2018-06-08 NOTE — Progress Notes (Signed)
Patient ID: Rebecca Rhodes, female   DOB: 03/15/1981, 37 y.o.   MRN: [id number] Reviewed: Agree with Dr. Macky LowerKoval's documentation and management.

## 2018-06-28 ENCOUNTER — Ambulatory Visit: Payer: Self-pay | Admitting: Pharmacist

## 2018-08-14 ENCOUNTER — Telehealth: Payer: Self-pay

## 2018-08-14 NOTE — Telephone Encounter (Signed)
-----   Message from Tillman Sers, DO sent at 08/10/2018  9:04 AM EDT ----- Regarding: fup appointment  Ms. Arave was due to return to pharmacy clinic, could you make her an appointment with them? Thanks

## 2018-08-14 NOTE — Telephone Encounter (Signed)
Scheduled 9/12 with Koval.

## 2018-08-23 ENCOUNTER — Encounter: Payer: Self-pay | Admitting: Pharmacist

## 2018-08-23 ENCOUNTER — Ambulatory Visit (INDEPENDENT_AMBULATORY_CARE_PROVIDER_SITE_OTHER): Payer: Self-pay | Admitting: Pharmacist

## 2018-08-23 VITALS — BP 140/110 | HR 99 | Ht 65.0 in | Wt 207.0 lb

## 2018-08-23 DIAGNOSIS — E7849 Other hyperlipidemia: Secondary | ICD-10-CM

## 2018-08-23 DIAGNOSIS — B379 Candidiasis, unspecified: Secondary | ICD-10-CM

## 2018-08-23 DIAGNOSIS — IMO0002 Reserved for concepts with insufficient information to code with codable children: Secondary | ICD-10-CM

## 2018-08-23 DIAGNOSIS — I10 Essential (primary) hypertension: Secondary | ICD-10-CM

## 2018-08-23 DIAGNOSIS — E118 Type 2 diabetes mellitus with unspecified complications: Secondary | ICD-10-CM

## 2018-08-23 DIAGNOSIS — E1165 Type 2 diabetes mellitus with hyperglycemia: Secondary | ICD-10-CM

## 2018-08-23 MED ORDER — LISINOPRIL-HYDROCHLOROTHIAZIDE 20-25 MG PO TABS: 1.0000 | ORAL_TABLET | Freq: Every day | ORAL | 0 refills | Status: DC

## 2018-08-23 MED ORDER — LIRAGLUTIDE 18 MG/3ML ~~LOC~~ SOPN: 1.2000 mg | PEN_INJECTOR | Freq: Every day | SUBCUTANEOUS | 0 refills | Status: DC

## 2018-08-23 MED ORDER — INSULIN DEGLUDEC 200 UNIT/ML ~~LOC~~ SOPN: 50.0000 [IU] | PEN_INJECTOR | Freq: Every day | SUBCUTANEOUS | 0 refills | Status: DC

## 2018-08-23 MED ORDER — METFORMIN HCL ER (MOD) 1000 MG PO TB24: 2000.0000 mg | ORAL_TABLET | Freq: Every day | ORAL | 0 refills | Status: DC

## 2018-08-23 MED ORDER — CLONIDINE HCL 0.1 MG PO TABS
0.1000 mg | ORAL_TABLET | Freq: Once | ORAL | Status: AC
  Administered 2018-08-23: 0.1 mg via ORAL

## 2018-08-23 MED ORDER — ATORVASTATIN CALCIUM 80 MG PO TABS: 80.0000 mg | ORAL_TABLET | Freq: Every day | ORAL | 0 refills | Status: DC

## 2018-08-23 MED ORDER — INSULIN ASPART 100 UNIT/ML ~~LOC~~ SOLN: 30.0000 [IU] | Freq: Two times a day (BID) | SUBCUTANEOUS | 0 refills | Status: AC

## 2018-08-23 MED ORDER — FLUCONAZOLE 100 MG PO TABS
300.0000 mg | ORAL_TABLET | ORAL | Status: AC
  Administered 2018-08-23: 300 mg via ORAL

## 2018-08-23 MED FILL — ATORVASTATIN 80 MG TABLET: 80 | 30 days supply | Qty: 30 | Fill #0

## 2018-08-23 MED FILL — LISINOPRIL-HCTZ 20-25 MG TA: 20-25 | 30 days supply | Qty: 30 | Fill #0

## 2018-08-23 MED FILL — METFORMIN HCL ER 500 MG TAB: 500 | 30 days supply | Qty: 120 | Fill #0

## 2018-08-23 NOTE — Patient Instructions (Signed)
It was great to see you today!  It's really important that you go to MAP to help with your medication! Please try and visit them as soon as you can.   For your blood sugar: 1. Resume taking your Victoza 1.2mg  once a day. 2. Resume taking your Evaristo Buryresiba (long acting insulin) 70 units once a day. Call us if your sugars go below 80 when you are measuring your sugars, or if you ever feel shaky, sweaty, lightheaded, or low blood sugar. 3. Resume taking your Novolog 30 units twice a day, with your biggest meals. You can take a third dose with a MEAL, but not with snacks. 4. Start eating your favorite veggies more often. 5. Resume taking your blood pressure medication (Lisinopril/HCTZ) and your cholesterol medication (Atorvastatin). 6. Take your fluconazole, one tablet once a day, for the next two days. It is important that you finish this medication, even if your infection feels better.  Follow up with us in 2 weeks! We will look forward to see you then!

## 2018-08-23 NOTE — Progress Notes (Signed)
S:    Patient arrives in good spirits alone. Presents to the clinic for hypertension evaluation, diabetes evaluation, and medication management. Patient arrives in fair spirits, ambulating without difficulty, somewhat flat affect. Presents for diabetes evaluation, education, and management at the request of Dr. Dolores Patty. Patient was referred on 01/10/2018. Last Rx Clinic visit 06/07/2018; at that visit, her medications were restarted and samples were provided.  Family/Social History: Her mother and grandmother all have diabetes. Her mother has also suffered from two strokes. Unsure about paternal side.  Insurance coverage/medication affordability: Currently has [employer], which has very poor coverage of her medications. Patient knows that she is supposed to work with MAP at the health department for her medication coverage, but work and family commitments have made it difficult to go there. She states that she cannot afford any medications currently, and gets paid next week.  Patient denies adherence with medications except Tresiba. She was not able to pick up her blood pressure medication from the pharmacy; additionally she ran out of her Victoza and Novolog two weeks ago. Current BP Meds include: lisinopril/HCTZ 20/25mg , metoprolol succinate 25mg  daily Current diabetes medications include: metformin XR 1000mg  2 tab daily, liraglutide (Victoza) 1.2mg  once daily, insulin degludec Evaristo Bury) 70 units once daily, insulin aspart (Novolog) 30 units BID AC  Dietary habits include: Mother in [place] grocery shops, Patient cooks, eats a lot of chicken, beef, pasta, fruits. Patient enjoys carrots, cabbage, greens, and celery, but often eats canned vegetables because of what she can afford. Usually prepared by pan-frying or baking. Exercise habits include: Minimal, as it is too hot for her. Enjoys walking and going to the park with her 5yo daughter when she can.  Patient denies hypoglycemic  events. Patient reports nocturia, 2-3X a night. Patient reports neuropathy. She feels a tingling and cramping in her feet and her calves. She denies it getting worse with walking, mainly with standing. Patient reports visual changes. She appreciates that this is due to her blood sugar, as she notes that when her vision blurs her sugar is high, and that her grandmother had the same issue.  Patient does not measure her blood sugar or blood pressure regularly; when she was on all of her medications, she reports the lowest sugar she has had was 86.  Additionally, patient has complaints of worsening yeast infections, and has not been able to pick up her Diflucan due to pricing.   Clinical ASCVD: No ASCVD risk factors : has no appreciable risk factors.  O:  Physical Exam  Constitutional: She appears well-developed and well-nourished.  Pulmonary/Chest: No respiratory distress.    Review of Systems  Eyes: Positive for blurred vision.  Respiratory: Negative for shortness of breath.   Cardiovascular: Negative for chest pain, palpitations and orthopnea.  Neurological: Negative for dizziness, speech change and headaches.    Last 3 Office BP readings: BP Readings from Last 3 Encounters:  08/23/18 (!) 140/110  06/07/18 (!) 138/102  05/10/18 (!) 142/98   In Office Readings: 170/122-130 (manual reading, read 4 times) Post Clonidine Reading: 140/110   BMET    Component Value Date/Time   NA 139 09/15/2017 1408   K 3.9 09/15/2017 1408   CL 102 09/15/2017 1408   CO2 24 09/15/2017 1408   GLUCOSE 222 (H) 09/15/2017 1408   GLUCOSE 334 (H) 02/13/2016 1029   BUN 11 09/15/2017 1408   CREATININE 1.08 (H) 09/15/2017 1408   CREATININE 0.84 02/04/2013 0902   CALCIUM 8.9 09/15/2017 1408  GFRNONAA 66 09/15/2017 1408   GFRAA 76 09/15/2017 1408    Lab Results  Component Value Date   CHOL 143 11/27/2017   HDL 51 11/27/2017   LDLCALC 77 11/27/2017   TRIG 76 11/27/2017   CHOLHDL 2.8 11/27/2017      A/P: Hypertension longstanding diagnosed currently uncontrolled. Financial barriers and lack of lifestyle interventions prevent optimal control. BP Goal = <130/80 mmHg.  -Administered clonidine 0.1mg  today in office. Also assessed by Dr. Manson PasseyBrown. -Restarted lisinopril/HCTZ 20/25mg  once daily (received from Outpatient Pharmacy on indigent funds). -Will hold metoprolol until patient can afford. -Counseled on lifestyle modifications for blood pressure control including reduced dietary sodium, increased exercise, adequate sleep -At next visit, will order BMET and need for additional medications.  Diabetes longstanding currently uncontrolled on insulin. Patient is not adherent with medication. Control is suboptimal due to financial barriers to adherence and dietary interventions. -Continued basal insulin Tresiba (insulin degludec) 70 units daily.  -Continued  rapid insulin Novolog (insulin aspart) 30 units twice a day with biggest meals. Counseled that additional dose can be added if she eats a third meal, but not with snacks. -Changed formulation from metformin XR 1000mg  2 tablets once day to metformin XR 500mg  4 tablets once a day. ([facility]) -Extensively discussed pathophysiology of DM, recommended lifestyle interventions, dietary effects on glycemic control -Counseled on s/sx of and management of hypoglycemia -Next A1C anticipated at future visit, after she has resumed therapy.   Medication Samples have been provided to the patient. Drug name: Tresiba 200 units/ml  Qty: 2 pens 1 Pen: LOT: [id number]  Exp.Date: 02/09/2020 1 Pen: LOT: [id number]  Exp.Date: 07/11/2020 Drug name: Victoza 1.8mg /663ml Qty: 1 pen LOT: [id number]  Exp.Date: 06/11/2019 1 Pen: LOT: [id number]  Exp.Date: 07/11/2020 Drug name: Novolog 100 units/ml Qty: 2 vials LOT: [id number]  Exp.Date: 07/12/2019 The patient has been instructed regarding the correct time, dose, and frequency of taking this  medication, including desired effects and most common side effects.   ASCVD risk - primary prevention in patient with DM. Last LDL was taken in 2018 and is almost controlled. Goal= <70. -At this visit, continue taking atorvastatin 80mg . ([facility]) -At next visit, order lipid panel and reassess.  Complaints of yeast infection, unable to treat due to limited affordability. Discussed with Dr. Manson PasseyBrown. -Provided with fluconazole 100mg  to take once daily for three days. -Administered first dose in office.   Written patient instructions provided.  Total time in face to face counseling 60 minutes.   Follow up with Pharmacist Clinic Visit in around 2 weeks. Patient seen with Caffie PintoAkshara Kumar, PharmD Candidate.

## 2018-08-23 NOTE — Assessment & Plan Note (Signed)
Hypertension longstanding diagnosed currently uncontrolled. Financial barriers and lack of lifestyle interventions prevent optimal control. BP Goal = <130/80 mmHg.  -Administered clonidine 0.1mg  today in office. Also assessed by Dr. Manson PasseyBrown. -Restarted lisinopril/HCTZ 20/25mg  once daily (received from Outpatient Pharmacy on indigent funds). -Will hold metoprolol until patient can afford. -Counseled on lifestyle modifications for blood pressure control including reduced dietary sodium, increased exercise, adequate sleep -At next visit, will order BMET and need for additional medications.

## 2018-08-23 NOTE — Assessment & Plan Note (Signed)
Diabetes longstanding currently uncontrolled on insulin. Patient is not adherent with medication. Control is suboptimal due to financial barriers to adherence and dietary interventions. -Continued basal insulin Tresiba (insulin degludec) 70 units daily.  -Continued  rapid insulin Novolog (insulin aspart) 30 units twice a day with biggest meals. Counseled that additional dose can be added if she eats a third meal, but not with snacks. -Changed formulation from metformin XR 1000mg  2 tablets once day to metformin XR 500mg  4 tablets once a day. ([facility]) -Extensively discussed pathophysiology of DM, recommended lifestyle interventions, dietary effects on glycemic control -Counseled on s/sx of and management of hypoglycemia -Next A1C anticipated at future visit, after she has resumed therapy.

## 2018-08-23 NOTE — Progress Notes (Deleted)
clonodine

## 2018-08-28 NOTE — Progress Notes (Signed)
Patient ID: Rebecca Rhodes, female   DOB: 10/10/1981, 37 y.o.   MRN: [id number] I reviewed and agree with Dr. Macky LowerKoval's documentation and management.

## 2018-09-03 ENCOUNTER — Encounter: Payer: Self-pay | Admitting: Pharmacist

## 2018-09-03 ENCOUNTER — Ambulatory Visit (INDEPENDENT_AMBULATORY_CARE_PROVIDER_SITE_OTHER): Payer: Self-pay | Admitting: Pharmacist

## 2018-09-03 DIAGNOSIS — I10 Essential (primary) hypertension: Secondary | ICD-10-CM

## 2018-09-03 DIAGNOSIS — IMO0002 Reserved for concepts with insufficient information to code with codable children: Secondary | ICD-10-CM

## 2018-09-03 DIAGNOSIS — E118 Type 2 diabetes mellitus with unspecified complications: Secondary | ICD-10-CM

## 2018-09-03 DIAGNOSIS — E1165 Type 2 diabetes mellitus with hyperglycemia: Secondary | ICD-10-CM

## 2018-09-03 MED ORDER — AMLODIPINE BESYLATE 5 MG PO TABS: 5.0000 mg | ORAL_TABLET | Freq: Every day | ORAL | 3 refills | Status: DC

## 2018-09-03 MED ORDER — SEMAGLUTIDE(0.25 OR 0.5MG/DOS) 2 MG/1.5ML ~~LOC~~ SOPN: 0.5000 mg | PEN_INJECTOR | SUBCUTANEOUS | 11 refills | Status: DC

## 2018-09-03 MED ORDER — SEMAGLUTIDE(0.25 OR 0.5MG/DOS) 2 MG/1.5ML ~~LOC~~ SOPN: PEN_INJECTOR | SUBCUTANEOUS | 0 refills | Status: DC

## 2018-09-03 MED ORDER — INSULIN DEGLUDEC 200 UNIT/ML ~~LOC~~ SOPN: 50.0000 [IU] | PEN_INJECTOR | Freq: Every day | SUBCUTANEOUS | 0 refills | Status: DC

## 2018-09-03 MED FILL — AMLODIPINE BESYLATE 5 MG TA: 5 | 30 days supply | Qty: 30 | Fill #0

## 2018-09-03 NOTE — Progress Notes (Addendum)
S:     Chief Complaint  Patient presents with  . Medication Management    Diabetes, HTN    Patient arrives in good spirits, ambulating without difficulty. Presents to the clinic for hypertension evaluation, diabetes evaluation, and medication management at the request of Dr. Lucila Maine. Patient was referred on 01/10/2018. Last seen in pharmacy clinic on 08/23/2018 - at that time lisinopril/HCTZ was restarted  medications were restarted and samples of antidiabetic medication were provided.   Affect has greatly improved from last visit. Patient reports having a secure food supply and her mother-in-law has [employer] and is able to obtain food. However she is limited in the food choices, but does endorse her mother-in-law is making changes soon for healthier options. She reports she has not been able to meet with Kennyth Lose for her [facility] card, nor has been to the health department for the [facility] program, but does intend to do this. Patient reports having supply of all medications currently.   Family/Social History: Her mother and grandmother all have diabetes. Her mother has also suffered from two strokes. Unsure about paternal side.  Insurance coverage/medication affordability: Currently has [employer], which has very poor coverage of her medications. Patient knows that she is supposed to work with MAP at the health department for her medication coverage, but work and family commitments have made it difficult to go there.   Patient reports adherence with medications.  Current diabetes medications include: metformin 1059m BID, Victoza 1.2 mg daily, Tresiba 70 units daily, Novolog 30 units with meals Current hypertension medications include: lisinopril-HCTZ 20-25 mg   Patient denies hypoglycemic events.  Patient reported dietary habits: Eats 1-2 meals/day Breakfast:rare Lunch:can of spaghetti and orange juice Dinner:pork chops and mac+cheese, water+lime Snacks:none Drink: soda,  juice, water, sweet tea - tolerate Splenda   Patient-reported exercise habits: none at present    Patient reports nocturia with 1 episode/night - improved from last visit.  Patient reports neuropathy with tingling in her right leg. Patient reports visual changes and has had an eye exam within the past year.  Patient denies self foot exams. Counseled on importance of daily foot exams.   O:  Physical Exam  Constitutional: She appears well-developed and well-nourished.  Musculoskeletal: She exhibits no edema.  No edema  Vitals reviewed.    Review of Systems  Eyes: Positive for blurred vision.       Reading has become more difficult     Lab Results  Component Value Date   HGBA1C 15.0 01/10/2018   Vitals:   09/03/18 1038  BP: (!) 150/109  Pulse: 83  SpO2: 98%    Lipid Panel     Component Value Date/Time   CHOL 143 11/27/2017 1311   TRIG 76 11/27/2017 1311   HDL 51 11/27/2017 1311   CHOLHDL 2.8 11/27/2017 1311   CHOLHDL 3.1 10/05/2016 1623   VLDL 21 10/05/2016 1623   LDLCALC 77 11/27/2017 1311    Home fasting CBG: no home readings   Clinical ASCVD: No  ASCVD risk factors : has no appreciable risk factors.  A/P: Diabetes longstanding currently better controlled, however difficult to evaluate due to lack of home BG reading. Patient is able to verbalize appropriate hypoglycemia management plan. Patient is adherent with medication and has supply of all medication.  -Continued basal insulin Tresiba (insulin degludec) 70 units daily.  -Continued  rapid insulin Novolog (insulin aspart) 30 units twice a day with biggest meals. Counseled that additional dose can be added if  she eats a third meal, but not with snacks. -Changed formulation from metformin XR 587m 2 tablets BID  -Extensively discussed pathophysiology of DM, recommended lifestyle interventions, dietary effects on glycemic control -Counseled on s/sx of and management of hypoglycemia -Next A1C anticipated at  future visit 12/19, around 3 months after she has resumed therapy.     Hypertension longstanding, currently uncontrolled. Financial barriers and lack of lifestyle interventions prevent optimal control. BP Goal = <130/80 mmHg.  -Continue lisinopril/HCTZ 20/243monce daily -Start amlodipine 67m44mnce daily (received from Outpatient Pharmacy on indigent funds). -Counseled on lifestyle modifications for blood pressure control including reduced dietary sodium, increased exercise, adequate sleep -BMET today to evaluated electrolytes and renal function   Written patient instructions provided.  Total time in face to face counseling 60 minutes.   Follow up with Pharmacist Clinic Visit as needed. Follow up with Dr. RicVanetta Shawl 2 weeks. Patient seen with AksSharyne PeachharmD Candidate and AnnIsaias SakaiharmD PGY1.    BMET - normal with exception of HCO3 of 19 and  Glucose reading of 275.

## 2018-09-03 NOTE — Assessment & Plan Note (Signed)
Hypertension longstanding, currently uncontrolled. Financial barriers and lack of lifestyle interventions prevent optimal control. BP Goal = <130/80 mmHg.  -Continue lisinopril/HCTZ 20/25mg  once daily -Start amlodipine 5mg  once daily (received from Outpatient Pharmacy on indigent funds). -Counseled on lifestyle modifications for blood pressure control including reduced dietary sodium, increased exercise, adequate sleep -BMET today to evaluated electrolytes and renal function

## 2018-09-03 NOTE — Assessment & Plan Note (Signed)
Diabetes longstanding currently better controlled, however difficult to evaluate due to lack of home BG reading. Patient is able to verbalize appropriate hypoglycemia management plan. Patient is adherent with medication and has supply of all medication.  -Continued basal insulin Tresiba (insulin degludec) 70 units daily.  -Continued  rapid insulin Novolog (insulin aspart) 30 units twice a day with biggest meals. Counseled that additional dose can be added if she eats a third meal, but not with snacks. -Changed formulation from metformin XR 500mg  2 tablets BID  -Extensively discussed pathophysiology of DM, recommended lifestyle interventions, dietary effects on glycemic control -Counseled on s/sx of and management of hypoglycemia -Next A1C anticipated at future visit 12/19, around 3 months after she has resumed therapy.

## 2018-09-03 NOTE — Patient Instructions (Addendum)
It was great to see you today.   Congratulations on the recent weight loss!  Your blood pressure is not at goal of less than 130/9980mmHg - Start amlodipine 5mg  daily    Your blood sugars seem to be improved  - Continue taking Victoza 1.2 mg daily until gone - Then, start taking Ozempic 0.25mg  once weekly and increase to 0.5mg  once weekly if you tolerate the medication (GI upset, nausea)  - Continue metformin 1000mg  BID, Tresiba 70 units daily, Novolog 30 units with meals - Start taking your blood glucose 3 times a week  - If you have any low blood sugar (less than 70), eat something to raise it then recheck and give clinic a call   Follow up with Dr. Wonda Oldsiccio in two weeks Follow up with Annice PihJackie to get orange card and the health department for the MAP program to have a continued drug supply

## 2018-09-04 LAB — BASIC METABOLIC PANEL
BUN/Creatinine Ratio: 16 (ref 9–23)
BUN: 15 mg/dL (ref 6–20)
CHLORIDE: 102 mmol/L (ref 96–106)
CO2: 19 mmol/L — ABNORMAL LOW (ref 20–29)
CREATININE: 0.91 mg/dL (ref 0.57–1.00)
Calcium: 9.5 mg/dL (ref 8.7–10.2)
GFR calc non Af Amer: 80 mL/min/{1.73_m2} (ref >59)
GFR, EST AFRICAN AMERICAN: 93 mL/min/{1.73_m2} (ref >59)
Glucose: 275 mg/dL — ABNORMAL HIGH (ref 65–99)
Potassium: 3.7 mmol/L (ref 3.5–5.2)
Sodium: 136 mmol/L (ref 134–144)

## 2018-09-21 ENCOUNTER — Ambulatory Visit: Payer: Medicaid Other | Admitting: Family Medicine

## 2018-10-16 ENCOUNTER — Telehealth: Payer: Self-pay | Admitting: Family Medicine

## 2018-10-16 NOTE — Telephone Encounter (Signed)
Spoke with pt reminding them of/confirming their appt for tomorrow 10/17/2018. -CH °

## 2018-10-17 ENCOUNTER — Ambulatory Visit (INDEPENDENT_AMBULATORY_CARE_PROVIDER_SITE_OTHER): Payer: Self-pay | Admitting: Family Medicine

## 2018-10-17 ENCOUNTER — Encounter: Payer: Self-pay | Admitting: Family Medicine

## 2018-10-17 ENCOUNTER — Other Ambulatory Visit: Payer: Self-pay

## 2018-10-17 VITALS — BP 130/104 | HR 93 | Temp 98.0°F | Ht 66.0 in | Wt 208.6 lb

## 2018-10-17 DIAGNOSIS — I10 Essential (primary) hypertension: Secondary | ICD-10-CM

## 2018-10-17 DIAGNOSIS — IMO0002 Reserved for concepts with insufficient information to code with codable children: Secondary | ICD-10-CM

## 2018-10-17 DIAGNOSIS — E7849 Other hyperlipidemia: Secondary | ICD-10-CM

## 2018-10-17 DIAGNOSIS — E118 Type 2 diabetes mellitus with unspecified complications: Secondary | ICD-10-CM

## 2018-10-17 DIAGNOSIS — E1165 Type 2 diabetes mellitus with hyperglycemia: Secondary | ICD-10-CM

## 2018-10-17 DIAGNOSIS — M79604 Pain in right leg: Secondary | ICD-10-CM | POA: Insufficient documentation

## 2018-10-17 DIAGNOSIS — Z23 Encounter for immunization: Secondary | ICD-10-CM

## 2018-10-17 LAB — POCT GLYCOSYLATED HEMOGLOBIN (HGB A1C): HbA1c POC (<> result, manual entry): >15 % (ref 4.0–5.6)

## 2018-10-17 MED ORDER — AMLODIPINE BESYLATE 5 MG PO TABS: 5.0000 mg | ORAL_TABLET | Freq: Every day | ORAL | 3 refills | Status: DC

## 2018-10-17 MED ORDER — LISINOPRIL-HYDROCHLOROTHIAZIDE 20-25 MG PO TABS: 1.0000 | ORAL_TABLET | Freq: Every day | ORAL | 0 refills | Status: DC

## 2018-10-17 MED ORDER — ATORVASTATIN CALCIUM 80 MG PO TABS: 80.0000 mg | ORAL_TABLET | Freq: Every day | ORAL | 0 refills | Status: DC

## 2018-10-17 MED ORDER — ASPIRIN 81 MG PO CHEW: 81.0000 mg | CHEWABLE_TABLET | Freq: Every day | ORAL | 11 refills | Status: AC

## 2018-10-17 MED ORDER — METFORMIN HCL ER (MOD) 1000 MG PO TB24: 2000.0000 mg | ORAL_TABLET | Freq: Every day | ORAL | 2 refills | Status: AC

## 2018-10-17 NOTE — Assessment & Plan Note (Signed)
  Refilled statin, will check lipid panel today.

## 2018-10-17 NOTE — Progress Notes (Signed)
    Subjective:    Patient ID: Rebecca Rhodes, female    DOB: Jun 20, 1981, 37 y.o.   MRN: [id number]   CC: leg pain, DM fup  HPI: Right leg hurts calf muscle up to thigh at night Walking does not make it hurt Getting up from sitting position hurts. Did not injure her leg. It does not swell. It's not red or hot.   DM- Forgetting to take DM medications, forgot the novolog and tresiba today and has not taken metformin in a while. Last took ozempic last Friday. She misplaced her metformin so has not taken it "in a while". She has increased thirst and urination.   HTN- Misplaced her meds so has not taken them "in a while". She denies CP, SOB.  HLD- Hasn't taken lipitor in a while, also lost this.  Only meds she is taking are the insulins, but she is having trouble affording them. Was told to get orange card but has not done this yet.  Smoking status reviewed- non-smoker  Review of Systems- see HPI   Objective:  BP (!) 130/104   Pulse 93   Temp 98 F (36.7 C) (Oral)   Ht 5\' 6"  (1.676 m)   Wt 208 lb 9.6 oz (94.6 kg)   LMP 09/27/2018 (Exact Date)   SpO2 98%   BMI 33.67 kg/m  Vitals and nursing note reviewed  General: well nourished, in no acute distress HEENT: normocephalic, TM's visualized bilaterally, no scleral icterus or conjunctival pallor, no nasal discharge, moist mucous membranes, good dentition without erythema or discharge noted in posterior oropharynx Neck: supple, non-tender, without lymphadenopathy Cardiac: RRR, clear S1 and S2, no murmurs, rubs, or gallops Respiratory: clear to auscultation bilaterally, no increased work of breathing Abdomen: soft, nontender, nondistended, no masses or organomegaly. Bowel sounds present Extremities: no edema or cyanosis. Warm, well perfused. 2+ radial and PT pulses bilaterally Skin: warm and dry, no rashes noted Neuro: alert and oriented, no focal deficits   Assessment & Plan:    Hyperlipidemia  Refilled statin, will check  lipid panel today.  Diabetes mellitus type 2, uncontrolled, with complications (HCC)  Not controlled. A1C >15. Patient has a long history of not taking medications. Refilled her medications for her. Follow up 1 month. There are no changes I can make today as I do not know what her control would be if she were to take medications as prescribed. I did tell her she could increase her ozempic but I do not believe she will.   HTN (hypertension)  Not controlled 2/2 non-adherence. Refilled meds. Check BMP today. Follow up 1 month.  Pain of right lower extremity  Unclear cause. No swelling or redness, no concern for DVT. Will check electrolytes today. Patient can take tylenol as needed for pain. Follow up 1 month.    Return in about 4 weeks (around 11/14/2018).   Dolores Patty, DO Family Medicine Resident PGY-3

## 2018-10-17 NOTE — Assessment & Plan Note (Signed)
  Not controlled. A1C >15. Patient has a long history of not taking medications. Refilled her medications for her. Follow up 1 month. There are no changes I can make today as I do not know what her control would be if she were to take medications as prescribed. I did tell her she could increase her ozempic but I do not believe she will.

## 2018-10-17 NOTE — Assessment & Plan Note (Signed)
  Not controlled 2/2 non-adherence. Refilled meds. Check BMP today. Follow up 1 month.

## 2018-10-17 NOTE — Assessment & Plan Note (Signed)
  Unclear cause. No swelling or redness, no concern for DVT. Will check electrolytes today. Patient can take tylenol as needed for pain. Follow up 1 month.

## 2018-10-17 NOTE — Patient Instructions (Signed)
  Please work on getting the orange card, we have the application papers up front.  Please pick up your medications and work to take them every day. It is important to control your blood pressure, diabetes, and high cholesterol to minimize the risk of serious health complications like kidney failure, heart attacks, and stroke.  I'd like to see you back every month to keep track of your progress.  If you have questions or concerns please do not hesitate to call at [phone].  Dolores Patty, DO PGY-3, [facility] 10/17/2018 10:54 AM

## 2018-10-18 ENCOUNTER — Encounter: Payer: Self-pay | Admitting: Family Medicine

## 2018-10-18 LAB — BASIC METABOLIC PANEL
BUN / CREAT RATIO: 13 (ref 9–23)
BUN: 11 mg/dL (ref 6–20)
CHLORIDE: 100 mmol/L (ref 96–106)
CO2: 20 mmol/L (ref 20–29)
Calcium: 8.9 mg/dL (ref 8.7–10.2)
Creatinine, Ser: 0.88 mg/dL (ref 0.57–1.00)
GFR calc Af Amer: 96 mL/min/{1.73_m2} (ref >59)
GFR calc non Af Amer: 84 mL/min/{1.73_m2} (ref >59)
Glucose: 370 mg/dL — ABNORMAL HIGH (ref 65–99)
POTASSIUM: 4 mmol/L (ref 3.5–5.2)
SODIUM: 135 mmol/L (ref 134–144)

## 2018-10-18 LAB — LIPID PANEL
CHOLESTEROL TOTAL: 195 mg/dL (ref 100–199)
Chol/HDL Ratio: 3.2 ratio (ref 0.0–4.4)
HDL: 61 mg/dL (ref >39)
LDL Calculated: 121 mg/dL — ABNORMAL HIGH (ref 0–99)
Triglycerides: 63 mg/dL (ref 0–149)
VLDL Cholesterol Cal: 13 mg/dL (ref 5–40)

## 2018-10-18 NOTE — Progress Notes (Signed)
  Letter sent to patient w/ lab results. Cr ok, LDL high likely because patient is not taking medication as prescribed.  Dolores Patty, DO PGY-3, [facility] 10/18/2018 12:33 PM

## 2018-11-16 ENCOUNTER — Encounter: Payer: Self-pay | Admitting: Family Medicine

## 2018-11-16 ENCOUNTER — Other Ambulatory Visit: Payer: Self-pay

## 2018-11-16 ENCOUNTER — Ambulatory Visit (INDEPENDENT_AMBULATORY_CARE_PROVIDER_SITE_OTHER): Payer: Self-pay | Admitting: Family Medicine

## 2018-11-16 VITALS — BP 152/110 | HR 99 | Temp 98.2°F | Ht 66.0 in | Wt 202.2 lb

## 2018-11-16 DIAGNOSIS — E7849 Other hyperlipidemia: Secondary | ICD-10-CM

## 2018-11-16 DIAGNOSIS — I10 Essential (primary) hypertension: Secondary | ICD-10-CM

## 2018-11-16 DIAGNOSIS — IMO0002 Reserved for concepts with insufficient information to code with codable children: Secondary | ICD-10-CM

## 2018-11-16 DIAGNOSIS — E118 Type 2 diabetes mellitus with unspecified complications: Secondary | ICD-10-CM

## 2018-11-16 DIAGNOSIS — M79604 Pain in right leg: Secondary | ICD-10-CM

## 2018-11-16 DIAGNOSIS — E1165 Type 2 diabetes mellitus with hyperglycemia: Secondary | ICD-10-CM

## 2018-11-16 MED ORDER — INSULIN GLARGINE 100 UNIT/ML SOLOSTAR PEN: 15.0000 [IU] | PEN_INJECTOR | Freq: Every day | SUBCUTANEOUS | 99 refills | Status: AC

## 2018-11-16 MED ORDER — ATORVASTATIN CALCIUM 80 MG PO TABS: 80.0000 mg | ORAL_TABLET | Freq: Every day | ORAL | 3 refills | Status: AC

## 2018-11-16 MED ORDER — AMLODIPINE BESYLATE 10 MG PO TABS: 10.0000 mg | ORAL_TABLET | Freq: Every day | ORAL | 0 refills | Status: AC

## 2018-11-16 MED ORDER — LISINOPRIL-HYDROCHLOROTHIAZIDE 20-25 MG PO TABS: 1.0000 | ORAL_TABLET | Freq: Every day | ORAL | 0 refills | Status: AC

## 2018-11-16 NOTE — Assessment & Plan Note (Signed)
  No obvious cause, likely muscular in nature but will get plain film to assess knee joint for arthritis. Doubt DVT, no calf swelling erythema or tenderness. Advised tylenol and icing for pain.

## 2018-11-16 NOTE — Assessment & Plan Note (Signed)
  Uncontrolled secondary to non-adherence. Samples of lantus given in office instructions to use 15 units every morning. continue metformin and novolog. Follow up 1 month.

## 2018-11-16 NOTE — Assessment & Plan Note (Signed)
  Refilled [facility]itor to [facility] should cost 4 dollars for patient. Asked her to call if she is unable to get this medicine.

## 2018-11-16 NOTE — Assessment & Plan Note (Signed)
  Uncontrolled 2/2 non-adherence and patient continuing to lose medications and forget to take them. Refilled norvasc and lisinopril-hctz to [facility], patient without insurance discussed cost to be 4-9 dollars per prescription. Asked her to please call me if she cannot get these medications. Follow up 1 month

## 2018-11-16 NOTE — Progress Notes (Signed)
    Subjective:    Patient ID: Rebecca Rhodes, female    DOB: 11/02/1981, 37 y.o.   MRN: [id number]   CC: follow up DM  HPI:  DM- only has novolog, metformin, and found some old lantus at home. Taking 30 units novolog every morning. 7 units lantus every morning. 2000 mg metformin every morning.   HTN- lost her BP medications. Denies chest pain, SOB. has blurred vision.  Right leg pain- feels like its coming from her knee. Has pain from knee down side to above ankle. Occasionally feels like leg is swollen. Taking tylenol with some relief.   HLD- lost her lipitor as well.  Smoking status reviewed  Review of Systems   Objective:  BP (!) 152/110   Pulse 99   Temp 98.2 F (36.8 C) (Oral)   Ht 5\' 6"  (1.676 m)   Wt 202 lb 4 oz (91.7 kg)   LMP 11/10/2018   SpO2 99%   BMI 32.64 kg/m  Vitals and nursing note reviewed  General: well nourished, in no acute distress HEENT: normocephalic, MMM Cardiac: regular rate Respiratory: no increased work of breathing Abdomen: obese. soft Extremities: no edema or cyanosis. R knee without deformity or tenderness to palpation, no effusion present. Normal ROM of knees bilaterally. No calf tenderness or swelling. Neuro: alert and oriented, no focal deficits   Assessment & Plan:    Diabetes mellitus type 2, uncontrolled, with complications (HCC)  Uncontrolled secondary to non-adherence. Samples of lantus given in office instructions to use 15 units every morning. continue metformin and novolog. Follow up 1 month.  HTN (hypertension)  Uncontrolled 2/2 non-adherence and patient continuing to lose medications and forget to take them. Refilled norvasc and lisinopril-hctz to [facility], patient without insurance discussed cost to be 4-9 dollars per prescription. Asked her to please call me if she cannot get these medications. Follow up 1 month  HLD (hyperlipidemia)  Refilled lipitor to [facility] should cost 4 dollars for patient. Asked her to call if  she is unable to get this medicine.  Pain of right lower extremity  No obvious cause, likely muscular in nature but will get plain film to assess knee joint for arthritis. Doubt DVT, no calf swelling erythema or tenderness. Advised tylenol and icing for pain.     Return in about 4 weeks (around 12/14/2018).   Dolores PattyAngela Tyde Lamison, DO Family Medicine Resident PGY-3

## 2018-11-16 NOTE — Patient Instructions (Signed)
  Please pick up your medications from [facility]. There are 2 blood pressure pills you will take every morning and 1 cholesterol pill to take every morning.   We have given you 3 sample pens of Lantus.  [facility]  We'll see you back in 1 month to follow up.  If you have questions or concerns please do not hesitate to call at [phone].  Dolores PattyAngela Emmagene Ortner, DO PGY-3, [facility] 11/16/2018 11:17 AM

## 2018-11-19 ENCOUNTER — Ambulatory Visit
Admission: RE | Admit: 2018-11-19 | Discharge: 2018-11-19 | Disposition: A | Discharge status: Home / Self Care | Payer: Medicaid Other | Source: Ambulatory Visit | Attending: Family Medicine | Admitting: Family Medicine

## 2018-11-19 DIAGNOSIS — M79604 Pain in right leg: Secondary | ICD-10-CM

## 2018-11-20 ENCOUNTER — Telehealth: Payer: Self-pay

## 2018-11-20 NOTE — Telephone Encounter (Signed)
Spoke with pt. Advised her of her results as well as instructions for what to do for her knee pain. Also informed pt that the doctor wants to see her in 1 month. Pt understood.Aquilla Solianashira Isra Lindy, CMA

## 2018-11-20 NOTE — Progress Notes (Signed)
Please let Rebecca Rhodes know her x-ray had mild arthritis in her knee which may be causing her pain, no other issues seen. Please let her know to take tylenol and ice her knee for pain. I'll see her in 1 month for her follow up. Thanks

## 2018-12-19 ENCOUNTER — Ambulatory Visit: Payer: Medicaid Other | Admitting: Family Medicine

## 2019-02-10 DEATH — deceased

## 2019-02-13 ENCOUNTER — Telehealth: Payer: Self-pay | Admitting: Family Medicine

## 2019-02-13 NOTE — Telephone Encounter (Signed)
Death Certificate was dropped off and has been placed in PCP's box for completion. Please use blue or black ink, no Casher out, strike throughs or highlite. Please return to [facility] once completed.  °

## 2019-02-15 NOTE — Telephone Encounter (Signed)
Given to [facility]
